# Patient Record
Sex: Female | Born: 1950 | Race: White | Hispanic: No | Marital: Married | Smoking: Never smoker
Health system: Southern US, Community
[De-identification: ages and names within clinical notes are randomized; demographics above are authoritative.]

## PROBLEM LIST (undated history)

## (undated) DIAGNOSIS — Z800 Family history of malignant neoplasm of digestive organs: Secondary | ICD-10-CM

## (undated) DIAGNOSIS — F419 Anxiety disorder, unspecified: Secondary | ICD-10-CM

## (undated) DIAGNOSIS — C801 Malignant (primary) neoplasm, unspecified: Secondary | ICD-10-CM

## (undated) DIAGNOSIS — D351 Benign neoplasm of parathyroid gland: Secondary | ICD-10-CM

## (undated) DIAGNOSIS — E119 Type 2 diabetes mellitus without complications: Secondary | ICD-10-CM

## (undated) DIAGNOSIS — K219 Gastro-esophageal reflux disease without esophagitis: Secondary | ICD-10-CM

## (undated) DIAGNOSIS — Z8489 Family history of other specified conditions: Secondary | ICD-10-CM

## (undated) DIAGNOSIS — Z8042 Family history of malignant neoplasm of prostate: Secondary | ICD-10-CM

## (undated) DIAGNOSIS — M810 Age-related osteoporosis without current pathological fracture: Secondary | ICD-10-CM

## (undated) HISTORY — DX: Age-related osteoporosis without current pathological fracture: M81.0

## (undated) HISTORY — PX: CHOLECYSTECTOMY: SHX55

## (undated) HISTORY — DX: Type 2 diabetes mellitus without complications: E11.9

## (undated) HISTORY — DX: Family history of malignant neoplasm of digestive organs: Z80.0

## (undated) HISTORY — DX: Benign neoplasm of parathyroid gland: D35.1

## (undated) HISTORY — DX: Family history of malignant neoplasm of prostate: Z80.42

## (undated) HISTORY — PX: DILATION AND CURETTAGE OF UTERUS: SHX78

## (undated) HISTORY — PX: ROTATOR CUFF REPAIR: SHX139

## (undated) HISTORY — PX: TONSILLECTOMY: SUR1361

## (undated) HISTORY — DX: Malignant (primary) neoplasm, unspecified: C80.1

## (undated) HISTORY — PX: ELBOW SURGERY: SHX618

## (undated) HISTORY — PX: COLONOSCOPY WITH ESOPHAGOGASTRODUODENOSCOPY (EGD): SHX5779

## (undated) HISTORY — DX: Gastro-esophageal reflux disease without esophagitis: K21.9

## (undated) HISTORY — DX: Anxiety disorder, unspecified: F41.9

---

## 2013-09-05 DIAGNOSIS — M1990 Unspecified osteoarthritis, unspecified site: Secondary | ICD-10-CM | POA: Insufficient documentation

## 2013-10-05 DIAGNOSIS — E119 Type 2 diabetes mellitus without complications: Secondary | ICD-10-CM | POA: Insufficient documentation

## 2014-01-25 DIAGNOSIS — L719 Rosacea, unspecified: Secondary | ICD-10-CM | POA: Insufficient documentation

## 2014-01-25 DIAGNOSIS — I8390 Asymptomatic varicose veins of unspecified lower extremity: Secondary | ICD-10-CM | POA: Insufficient documentation

## 2014-01-25 DIAGNOSIS — E785 Hyperlipidemia, unspecified: Secondary | ICD-10-CM | POA: Insufficient documentation

## 2014-01-30 DIAGNOSIS — M810 Age-related osteoporosis without current pathological fracture: Secondary | ICD-10-CM | POA: Insufficient documentation

## 2014-05-08 DIAGNOSIS — E559 Vitamin D deficiency, unspecified: Secondary | ICD-10-CM | POA: Insufficient documentation

## 2014-05-10 DIAGNOSIS — Z862 Personal history of diseases of the blood and blood-forming organs and certain disorders involving the immune mechanism: Secondary | ICD-10-CM | POA: Insufficient documentation

## 2016-10-21 HISTORY — PX: PARATHYROIDECTOMY: SHX19

## 2017-03-13 DIAGNOSIS — K219 Gastro-esophageal reflux disease without esophagitis: Secondary | ICD-10-CM | POA: Insufficient documentation

## 2017-03-13 DIAGNOSIS — F419 Anxiety disorder, unspecified: Secondary | ICD-10-CM | POA: Insufficient documentation

## 2017-03-13 DIAGNOSIS — Z8639 Personal history of other endocrine, nutritional and metabolic disease: Secondary | ICD-10-CM | POA: Insufficient documentation

## 2017-03-30 DIAGNOSIS — M5430 Sciatica, unspecified side: Secondary | ICD-10-CM | POA: Insufficient documentation

## 2017-04-06 DIAGNOSIS — C801 Malignant (primary) neoplasm, unspecified: Secondary | ICD-10-CM

## 2017-04-06 HISTORY — DX: Malignant (primary) neoplasm, unspecified: C80.1

## 2017-04-10 ENCOUNTER — Telehealth: Payer: Self-pay | Admitting: Gynecology

## 2017-04-10 NOTE — Telephone Encounter (Signed)
Received a call from Dr. Beryle Flock office to schedule an urgent appt for Cheryl Melton. Appt scheduled for the pt to see Dr. Fermin Schwab on 10/8 at 315pm. Referring office will notify the pt.

## 2017-04-13 ENCOUNTER — Encounter: Payer: Self-pay | Admitting: Gynecologic Oncology

## 2017-04-13 ENCOUNTER — Other Ambulatory Visit (HOSPITAL_BASED_OUTPATIENT_CLINIC_OR_DEPARTMENT_OTHER): Payer: Medicare Other

## 2017-04-13 ENCOUNTER — Ambulatory Visit: Payer: Medicare Other | Attending: Gynecology | Admitting: Gynecology

## 2017-04-13 ENCOUNTER — Encounter: Payer: Self-pay | Admitting: Gynecology

## 2017-04-13 VITALS — BP 130/74 | HR 99 | Temp 98.1°F | Resp 20 | Ht 67.0 in | Wt 247.3 lb

## 2017-04-13 DIAGNOSIS — Z7984 Long term (current) use of oral hypoglycemic drugs: Secondary | ICD-10-CM | POA: Insufficient documentation

## 2017-04-13 DIAGNOSIS — K669 Disorder of peritoneum, unspecified: Secondary | ICD-10-CM

## 2017-04-13 DIAGNOSIS — N839 Noninflammatory disorder of ovary, fallopian tube and broad ligament, unspecified: Secondary | ICD-10-CM

## 2017-04-13 DIAGNOSIS — Z800 Family history of malignant neoplasm of digestive organs: Secondary | ICD-10-CM

## 2017-04-13 DIAGNOSIS — N838 Other noninflammatory disorders of ovary, fallopian tube and broad ligament: Secondary | ICD-10-CM

## 2017-04-13 DIAGNOSIS — Z888 Allergy status to other drugs, medicaments and biological substances status: Secondary | ICD-10-CM | POA: Diagnosis not present

## 2017-04-13 DIAGNOSIS — Z7983 Long term (current) use of bisphosphonates: Secondary | ICD-10-CM | POA: Diagnosis not present

## 2017-04-13 DIAGNOSIS — Z886 Allergy status to analgesic agent status: Secondary | ICD-10-CM | POA: Diagnosis not present

## 2017-04-13 DIAGNOSIS — Z79899 Other long term (current) drug therapy: Secondary | ICD-10-CM | POA: Diagnosis not present

## 2017-04-13 DIAGNOSIS — C569 Malignant neoplasm of unspecified ovary: Secondary | ICD-10-CM | POA: Diagnosis present

## 2017-04-13 DIAGNOSIS — N979 Female infertility, unspecified: Secondary | ICD-10-CM | POA: Insufficient documentation

## 2017-04-13 LAB — BASIC METABOLIC PANEL
ANION GAP: 11 meq/L (ref 3–11)
BUN: 13.1 mg/dL (ref 7.0–26.0)
CO2: 26 mEq/L (ref 22–29)
Calcium: 9.8 mg/dL (ref 8.4–10.4)
Chloride: 103 mEq/L (ref 98–109)
Creatinine: 0.9 mg/dL (ref 0.6–1.1)
EGFR: 71 mL/min/{1.73_m2} — AB (ref >90)
Glucose: 135 mg/dl (ref 70–140)
POTASSIUM: 4.2 meq/L (ref 3.5–5.1)
SODIUM: 140 meq/L (ref 136–145)

## 2017-04-13 LAB — CBC WITH DIFFERENTIAL/PLATELET
BASO%: 0.8 % (ref 0.0–2.0)
Basophils Absolute: 0 10*3/uL (ref 0.0–0.1)
EOS%: 2.2 % (ref 0.0–7.0)
Eosinophils Absolute: 0.1 10*3/uL (ref 0.0–0.5)
HCT: 36.9 % (ref 34.8–46.6)
HGB: 11.9 g/dL (ref 11.6–15.9)
LYMPH#: 1.4 10*3/uL (ref 0.9–3.3)
LYMPH%: 25.5 % (ref 14.0–49.7)
MCH: 29 pg (ref 25.1–34.0)
MCHC: 32.2 g/dL (ref 31.5–36.0)
MCV: 89.9 fL (ref 79.5–101.0)
MONO#: 0.5 10*3/uL (ref 0.1–0.9)
MONO%: 8.5 % (ref 0.0–14.0)
NEUT%: 63 % (ref 38.4–76.8)
NEUTROS ABS: 3.6 10*3/uL (ref 1.5–6.5)
Platelets: 340 10*3/uL (ref 145–400)
RBC: 4.1 10*6/uL (ref 3.70–5.45)
RDW: 14.8 % — ABNORMAL HIGH (ref 11.2–14.5)
WBC: 5.6 10*3/uL (ref 3.9–10.3)

## 2017-04-13 NOTE — Progress Notes (Signed)
Consult Note: Gyn-Onc   Cheryl Melton 66 y.o. female  Chief Complaint  Patient presents with  . Malignant neoplasm of ovary, unspecified laterality ([place])    Assessment : Apparent stage III C ovarian carcinoma (pending final pathology report)  Plan: Presuming final pathology shows this to be an ovarian cancer, would recommend neoadjuvant chemotherapy given the extent of carcinomatosis documented by Dr. Jearld Shines operative note and photographs.  I recommend the patient have a CT scan of the chest abdomen and pelvis to use has baseline comparison. CA-125 be obtained today.  I described chemotherapy to the patient her 2 sisters and husband including carboplatin and Taxol and the general expected side effects. We'll arrange the patient see medical oncologist here in the [facility] to initiate therapy. We will reevaluate the patient after 3 cycles.     HPI: 66 year old white e there married female gravida  seen in consultation request of Dr.[name] regarding management of a newly diagnosed advanced ovarian cancer. 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 There is no free fluid or other adnexal masses noted. She underwent laparoscopy on 04/10/2017 with the surprise finding of peritoneal studding, omental cake, and a small amount of ascites. Biopsy of peritoneal implants was obtained but the result is pending at the time of this dictation. No tumor markers have been obtained.  Retrospectively the patient denies any particular symptoms that might been associated with ovarian cancer. She has no family history of ovarian or breast cancer. Her mother had colon cancer. She has no other gynecologic history although she has primary infertility.  Today she is recovering from laparoscopy 3 days  ago. Her umbilical incision is somewhat tender. Otherwise she has no other symptoms.  Review of Systems:10 point review of systems is negative except as noted in interval history.   Vitals: Blood pressure 130/74, pulse 99, temperature 98.1 F (36.7 C), temperature source Oral, resp. rate 20, height 5\' 7"  (1.702 m), weight 247 lb 4.8 oz (112.2 kg), SpO2 97 %.  Physical Exam: General : The patient is a healthy woman in no acute distress.  HEENT: normocephalic, extraoccular movements normal; neck is supple without thyromegally  Lynphnodes: Supraclavicular and inguinal nodes not enlarged  Abdomen: obese, Soft, non-tender, no ascites, no organomegally, no masses, no hernias  umbilical incision is healing. Pelvic:  EGBUS: Normal female  Vagina: Normal, no lesions  Urethra and Bladder: Normal, non-tender  Cervix:  small normal Uterus:  difficult to outline secondary to the patient's habitus. Bi-manual examination: Non-tender; no adenxal masses or nodularity  Rectal: normal sphincter tone, no masses, no blood  Lower extremities: No edema or varicosities. Normal range of motion      Allergies  Allergen Reactions  . Aspirin Other (See Comments)    NSAIDS( advised not to take per hematology)  . Nsaids Other (See Comments)    Gastritis  . Tramadol Other (See Comments)    Headache, vomiting and disorientation     No past medical history on file.  No past surgical history on file.  Current Outpatient Prescriptions  Medication Sig Dispense Refill  . alendronate (FOSAMAX) 70 MG tablet Take 70 mg by mouth.    Marland Kitchen atorvastatin (LIPITOR) 80 MG tablet Take 80 mg by mouth.    . citalopram (CELEXA) 10 MG tablet TAKE  1 TABLET EVERY DAY    . gabapentin (NEURONTIN) 300 MG capsule Take 300 mg by mouth.    Marland Kitchen glucose blood test strip 1 each. USE ONE TOUCH VERIO TO CHECK BLOOD SUGARS ONCE DAILY.    Marland Kitchen Lancets 30G MISC Use 1 lancet daily to test blood sugars (E11.9)    . losartan (COZAAR) 25 MG tablet  Take 25 mg by mouth.    . meclizine (ANTIVERT) 25 MG tablet Take 25 mg by mouth.    . metFORMIN (GLUCOPHAGE) 1000 MG tablet Take 1,000 mg by mouth.    Marland Kitchen omeprazole (PRILOSEC) 40 MG capsule Take 40 mg by mouth.    . pioglitazone (ACTOS) 15 MG tablet Take 15 mg by mouth.    Marland Kitchen tiZANidine (ZANAFLEX) 4 MG tablet Take 4 mg by mouth.    Marland Kitchen acetaminophen (TYLENOL) 650 MG CR tablet Take 1,300 mg by mouth.    . Glucosamine Sulfate 500 MG TABS Take by mouth.    . linagliptin (TRADJENTA) 5 MG TABS tablet Take by mouth.    . loratadine (CLARITIN) 10 MG tablet Take 10 mg by mouth.    . vitamin B-12 (CYANOCOBALAMIN) 1000 MCG tablet Take by mouth.     No current facility-administered medications for this visit.     Social History   Social History  . Marital status: Married    Spouse name: N/A  . Number of children: N/A  . Years of education: N/A   Occupational History  . Not on file.   Social History Main Topics  . Smoking status: Not on file  . Smokeless tobacco: Not on file  . Alcohol use Not on file  . Drug use: Unknown  . Sexual activity: Not on file   Other Topics Concern  . Not on file   Social History Narrative  . No narrative on file    No family history on file.    [name], MD 04/13/2017, 3:43 PM

## 2017-04-13 NOTE — Patient Instructions (Signed)
Plan on having a CA 125 drawn (tumor marker-elevated 80% of the time with ovarian cancer).  We will also obtain a CT scan of the chest, abdomen, and pelvis.  We will contact you with the results.  Plan on meeting with Dr. Heath Lark, Medical Oncologist to discuss and arrange for chemotherapy.  We recommend three cycles of carboplatin and taxol then consideration for surgery.  Please call for any questions or concerns.

## 2017-04-14 ENCOUNTER — Telehealth: Payer: Self-pay | Admitting: *Deleted

## 2017-04-14 ENCOUNTER — Telehealth: Payer: Self-pay | Admitting: Hematology and Oncology

## 2017-04-14 ENCOUNTER — Encounter: Payer: Self-pay | Admitting: Hematology and Oncology

## 2017-04-14 ENCOUNTER — Ambulatory Visit (HOSPITAL_BASED_OUTPATIENT_CLINIC_OR_DEPARTMENT_OTHER): Payer: Medicare Other | Admitting: Hematology and Oncology

## 2017-04-14 VITALS — BP 131/53 | HR 89 | Temp 98.5°F | Resp 17 | Ht 67.0 in | Wt 248.2 lb

## 2017-04-14 DIAGNOSIS — D351 Benign neoplasm of parathyroid gland: Secondary | ICD-10-CM | POA: Diagnosis not present

## 2017-04-14 DIAGNOSIS — S22080A Wedge compression fracture of T11-T12 vertebra, initial encounter for closed fracture: Secondary | ICD-10-CM

## 2017-04-14 DIAGNOSIS — E119 Type 2 diabetes mellitus without complications: Secondary | ICD-10-CM | POA: Diagnosis not present

## 2017-04-14 DIAGNOSIS — M8000XA Age-related osteoporosis with current pathological fracture, unspecified site, initial encounter for fracture: Secondary | ICD-10-CM

## 2017-04-14 DIAGNOSIS — S22009A Unspecified fracture of unspecified thoracic vertebra, initial encounter for closed fracture: Secondary | ICD-10-CM | POA: Insufficient documentation

## 2017-04-14 DIAGNOSIS — C569 Malignant neoplasm of unspecified ovary: Secondary | ICD-10-CM

## 2017-04-14 DIAGNOSIS — C561 Malignant neoplasm of right ovary: Secondary | ICD-10-CM | POA: Insufficient documentation

## 2017-04-14 LAB — CA 125: CANCER ANTIGEN (CA) 125: 324.1 U/mL — AB (ref 0.0–38.1)

## 2017-04-14 NOTE — Assessment & Plan Note (Signed)
This is fully resected and did not review any malignancy Her hypercalcemia has resolved No further workup is necessary

## 2017-04-14 NOTE — Assessment & Plan Note (Signed)
Patient had history of osteoporosis and vitamin D deficiency She has resumed vitamin D supplements since her parathyroid surgery I recommend she continues to same for now

## 2017-04-14 NOTE — Telephone Encounter (Signed)
Gave avs and calendar for October and November  °

## 2017-04-14 NOTE — Telephone Encounter (Signed)
Contacted the patient and scheduled new patient appt for Dr.Gorsuch at 2:30pm today.

## 2017-04-14 NOTE — Assessment & Plan Note (Signed)
She has well-controlled type 2 diabetes, medically managed I warned her about the risk of severe hypoglycemia during treatment

## 2017-04-14 NOTE — Assessment & Plan Note (Signed)
The patient has significant chronic back pain secondary to compression fracture I think she will benefit from kyphoplasty in the future For now, we will continue chronic pain management with Tylenol and gabapentin

## 2017-04-14 NOTE — Progress Notes (Signed)
START ON PATHWAY REGIMEN - Ovarian     A cycle is every 21 days:     Paclitaxel      Carboplatin   **Always confirm dose/schedule in your pharmacy ordering system**    Patient Characteristics: Newly Diagnosed, Neoadjuvant Therapy AJCC T Category: T3 AJCC N Category: N0 AJCC M Category: M0 Therapeutic Status: Newly Diagnosed, Neoadjuvant Therapy AJCC 8 Stage Grouping: Unknown Intent of Therapy: Curative Intent, Discussed with Patient

## 2017-04-14 NOTE — Assessment & Plan Note (Addendum)
I reviewed the plan of care with the patient and her sisters CT scan is pending Tumor marker is elevated. So far, I do not have any pathology report but according to information received from outside facility, it appears that she may have either primary peritoneal cancer versus ovarian cancer The patient will return to visit her surgeon at the end of the week and hopefully pathology report will be available by then If pathology report confirmed ovarian cancer, I think she would benefit from chemotherapy and surgery She will also benefit from genetic counseling in the future We discussed the role for neoadjuvant chemotherapy We discussed risk and benefit of port placement and the patient is undecided I recommend chemo education class, return visit next week to review imaging study and repeat baseline blood work I have tentatively schedule her to start first dose of combination chemotherapy with carboplatin and Taxol on April 24, 2018 Given her age, I would recommend prophylactic G-CSF support

## 2017-04-14 NOTE — Progress Notes (Signed)
[place] CONSULT NOTE  No care team member to display  CHIEF COMPLAINTS/PURPOSE OF CONSULTATION:  Newly diagnosed ovarian cancers versus primary peritoneal cancer, for further management  HISTORY OF PRESENTING ILLNESS:  Cheryl Melton 66 y.o. female is here because of recent diagnosis of cancer. She is accompanied by her 2 sisters, Scarlet and Velva Harman today.  The patient had been retired since early this year after she had a traumatic fall with vertebral fracture She was undergoing extensive evaluation by orthopedic surgery including MRI imaging I do not have copies of the report but according to outside report, she was noted to have abnormal pelvic mass At the time of MRI in May, she denies abdominal bloating, pain or postmenopausal bleeding She was found to have hypercalcemia from routine blood work Further imaging investigation revealed possible parathyroid adenoma/carcinoma requiring surgery and hence there is significant delay of getting evaluation for pelvic mass She subsequently underwent surgery for hyperparathyroidism which resolve her problems with hypercalcemia. She was subsequently referred to see GYN oncologist for evaluation and surgery.   I have reviewed her electronic records extensively from another facility Summary of oncologic history as follows:   Ovarian cancer ([place])   11/12/2016 Imaging    Outside MRI imaging evaluating her back pain (compression fracture of T12) subsequent ultrasound showed a right adnexal mass measuring 7.4 x 6.7 cm containing both cystic and solid area 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       03/20/2017 Imaging    GYN ultrasound was performed this revealed a small fundus measuring 5 cm there are 2 small fibroids within the fundus with the largest measuring 1.3 cm Within the endometrium there is a small amount a fluid the endometrial wall was slightly thickened at 4.8 mm the ultrasound was consistent with a small endometrial polyp  The  right adnexa measured 7.4 x 6.7 cm contained a both a cystic and solid mass with a small amount of shadowing This is consistent with a dermoid tumor the left ovary was 2.6 x 1.5 x 1.0 cm There is no other adnexal masses no free fluid within the abdomen no ascites          04/10/2017 Surgery    Operation: Operative Laparscopy with  Peritoneal biopsy 2 hysteroscopy D&C Surgeon: Lynder Parents MD  Specimens: Peritoneal biopsy and cellular washings  Complications: None FIndings Small amount of ascites was found cellular washings were sent There was peritoneal studding And omental caking Even in steep Trendelenburg the ovaries and uterus were unable to be visualized due to the small intestines in the lower pelvis          04/13/2017 Tumor Marker    Patient's tumor was tested for the following markers: CA-125 Results of the tumor marker test revealed 324.1      Since surgery, she complained of mild incisional pain. She has chronic baseline back pain since her fall of which she is taking extra strength Tylenol and gabapentin.  Her pain is reasonably controlled. She complained of abdominal bloating since surgery She denies changes in bowel habits No recent nausea or vomiting She had baseline type 2 diabetes well controlled without complications such as peripheral neuropathy.  MEDICAL HISTORY:  Past Medical History:  Diagnosis Date  . Anxiety   . Cancer ([place])   . Diabetes mellitus without complication ([place])   . GERD (gastroesophageal reflux disease)   . Osteoporosis   . Parathyroid adenoma     SURGICAL HISTORY: Past Surgical History:  Procedure  Laterality Date  . CHOLECYSTECTOMY    . COLONOSCOPY WITH ESOPHAGOGASTRODUODENOSCOPY (EGD)    . DILATION AND CURETTAGE OF UTERUS    . ELBOW SURGERY    . PARATHYROIDECTOMY  10/21/2016   One side done pt not sure which side.   Marland Kitchen ROTATOR CUFF REPAIR Right   . TONSILLECTOMY      SOCIAL HISTORY: Social History   Social  History  . Marital status: Married    Spouse name: Mikeal Hawthorne  . Number of children: 0  . Years of education: N/A   Occupational History  . retired    Social History Main Topics  . Smoking status: Never Smoker  . Smokeless tobacco: Never Used  . Alcohol use No  . Drug use: No  . Sexual activity: Not on file   Other Topics Concern  . Not on file   Social History Narrative  . No narrative on file    FAMILY HISTORY: Family History  Problem Relation Age of Onset  . Colon cancer Mother 30  . Prostate cancer Father 65    ALLERGIES:  is allergic to aspirin; nsaids; and tramadol.  MEDICATIONS:  Current Outpatient Prescriptions  Medication Sig Dispense Refill  . acetaminophen (TYLENOL) 650 MG CR tablet Take 1,300 mg by mouth.    Marland Kitchen alendronate (FOSAMAX) 70 MG tablet Take 70 mg by mouth.    Marland Kitchen atorvastatin (LIPITOR) 80 MG tablet Take 80 mg by mouth.    . citalopram (CELEXA) 10 MG tablet TAKE 1 TABLET EVERY DAY    . gabapentin (NEURONTIN) 300 MG capsule Take 300 mg by mouth.    . Glucosamine Sulfate 500 MG TABS Take by mouth.    Marland Kitchen glucose blood test strip 1 each. USE ONE TOUCH VERIO TO CHECK BLOOD SUGARS ONCE DAILY.    Marland Kitchen Lancets 30G MISC Use 1 lancet daily to test blood sugars (E11.9)    . linagliptin (TRADJENTA) 5 MG TABS tablet Take by mouth.    . loratadine (CLARITIN) 10 MG tablet Take 10 mg by mouth.    . losartan (COZAAR) 25 MG tablet Take 25 mg by mouth.    . meclizine (ANTIVERT) 25 MG tablet Take 25 mg by mouth.    . metFORMIN (GLUCOPHAGE) 1000 MG tablet Take 1,000 mg by mouth.    Marland Kitchen omeprazole (PRILOSEC) 40 MG capsule Take 40 mg by mouth.    . pioglitazone (ACTOS) 15 MG tablet Take 15 mg by mouth.    . vitamin B-12 (CYANOCOBALAMIN) 1000 MCG tablet Take by mouth.     No current facility-administered medications for this visit.     REVIEW OF SYSTEMS:   Constitutional: Denies fevers, chills or abnormal night sweats Eyes: Denies blurriness of vision, double vision or watery  eyes Ears, nose, mouth, throat, and face: Denies mucositis or sore throat Respiratory: Denies cough, dyspnea or wheezes Cardiovascular: Denies palpitation, chest discomfort or lower extremity swelling Gastrointestinal:  Denies nausea, heartburn or change in bowel habits Skin: Denies abnormal skin rashes Lymphatics: Denies new lymphadenopathy or easy bruising Neurological:Denies numbness, tingling or new weaknesses Behavioral/Psych: Mood is stable, no new changes  All other systems were reviewed with the patient and are negative.  PHYSICAL EXAMINATION: ECOG PERFORMANCE STATUS: 1 - Symptomatic but completely ambulatory  Vitals:   04/14/17 1358  BP: (!) 131/53  Pulse: 89  Resp: 17  Temp: 98.5 F (36.9 C)  SpO2: 97%   Filed Weights   04/14/17 1358  Weight: 248 lb 3.2 oz (112.6 kg)  GENERAL:alert, no distress and comfortable.  She is morbidly obese and appears debilitated.  She have difficulties getting up and down the examination table SKIN: skin color, texture, turgor are normal, no rashes or significant lesions.  Noted minus skin bruising on her abdominal wall near the incision site EYES: normal, conjunctiva are pink and non-injected, sclera clear OROPHARYNX:no exudate, no erythema and lips, buccal mucosa, and tongue normal  NECK: supple, thyroid normal size, non-tender, without nodularity.  Well-healed surgical scar LYMPH:  no palpable lymphadenopathy in the cervical, axillary or inguinal LUNGS: clear to auscultation and percussion with normal breathing effort HEART: regular rate & rhythm and no murmurs and no lower extremity edema ABDOMEN:abdomen soft, non-tender and normal bowel sounds.  Her abdomen is significantly distended/bloated from surgery.  Well-healed surgical scar Musculoskeletal:no cyanosis of digits and no clubbing  PSYCH: alert & oriented x 3 with fluent speech NEURO: no focal motor/sensory deficits  LABORATORY DATA:  I have reviewed the data as listed Lab  Results  Component Value Date   WBC 5.6 04/13/2017   HGB 11.9 04/13/2017   HCT 36.9 04/13/2017   MCV 89.9 04/13/2017   PLT 340 04/13/2017    Recent Labs  04/13/17 1626  NA 140  K 4.2  CO2 26  GLUCOSE 135  BUN 13.1  CREATININE 0.9  CALCIUM 9.8    ASSESSMENT & PLAN:  Ovarian cancer ([place]) I reviewed the plan of care with the patient and her sisters CT scan is pending Tumor marker is elevated. So far, I do not have any pathology report but according to information received from outside facility, it appears that she may have either primary peritoneal cancer versus ovarian cancer The patient will return to visit her surgeon at the end of the week and hopefully pathology report will be available by then If pathology report confirmed ovarian cancer, I think she would benefit from chemotherapy and surgery She will also benefit from genetic counseling in the future We discussed the role for neoadjuvant chemotherapy We discussed risk and benefit of port placement and the patient is undecided I recommend chemo education class, return visit next week to review imaging study and repeat baseline blood work I have tentatively schedule her to start first dose of combination chemotherapy with carboplatin and Taxol on April 24, 2018 Given her age, I would recommend prophylactic G-CSF support   Type 2 diabetes mellitus without complication ([place]) She has well-controlled type 2 diabetes, medically managed I warned her about the risk of severe hypoglycemia during treatment  Closed traumatic compression fracture of thoracic vertebra ([place]) The patient has significant chronic back pain secondary to compression fracture I think she will benefit from kyphoplasty in the future For now, we will continue chronic pain management with Tylenol and gabapentin  Osteoporosis Patient had history of osteoporosis and vitamin D deficiency She has resumed vitamin D supplements since her parathyroid  surgery I recommend she continues to same for now  Parathyroid adenoma This is fully resected and did not review any malignancy Her hypercalcemia has resolved No further workup is necessary  Orders Placed This Encounter  Procedures  . CBC with Differential    Standing Status:   Standing    Number of Occurrences:   20    Standing Expiration Date:   04/15/2018  . Comprehensive metabolic panel    Standing Status:   Standing    Number of Occurrences:   20    Standing Expiration Date:   04/15/2018  . CA 125  Standing Status:   Standing    Number of Occurrences:   9    Standing Expiration Date:   04/14/2018    All questions were answered. The patient knows to call the clinic with any problems, questions or concerns. I spent 60 minutes counseling the patient face to face. The total time spent in the appointment was 90 minutes and more than 50% was on counseling.     Heath Lark, MD 04/14/2017 4:28 PM

## 2017-04-15 ENCOUNTER — Telehealth: Payer: Self-pay | Admitting: Gynecologic Oncology

## 2017-04-15 NOTE — Telephone Encounter (Signed)
Called patient and advised of lab results including CA 125.  No concerns voiced.  Advised to call for any needs.

## 2017-04-17 ENCOUNTER — Ambulatory Visit (HOSPITAL_COMMUNITY)
Admission: RE | Admit: 2017-04-17 | Discharge: 2017-04-17 | Disposition: A | Discharge status: Home / Self Care | Payer: Medicare Other | Source: Ambulatory Visit | Attending: Gynecologic Oncology | Admitting: Gynecologic Oncology

## 2017-04-17 ENCOUNTER — Encounter (HOSPITAL_COMMUNITY): Payer: Self-pay | Admitting: Radiology

## 2017-04-17 DIAGNOSIS — N839 Noninflammatory disorder of ovary, fallopian tube and broad ligament, unspecified: Secondary | ICD-10-CM | POA: Insufficient documentation

## 2017-04-17 DIAGNOSIS — K669 Disorder of peritoneum, unspecified: Secondary | ICD-10-CM | POA: Insufficient documentation

## 2017-04-17 DIAGNOSIS — R9389 Abnormal findings on diagnostic imaging of other specified body structures: Secondary | ICD-10-CM | POA: Insufficient documentation

## 2017-04-17 DIAGNOSIS — R911 Solitary pulmonary nodule: Secondary | ICD-10-CM | POA: Insufficient documentation

## 2017-04-17 DIAGNOSIS — N838 Other noninflammatory disorders of ovary, fallopian tube and broad ligament: Secondary | ICD-10-CM

## 2017-04-17 MED ORDER — IOPAMIDOL (ISOVUE-300) INJECTION 61%
100.0000 mL | Freq: Once | INTRAVENOUS | Status: AC | PRN
  Administered 2017-04-17: 100 mL via INTRAVENOUS

## 2017-04-17 MED ORDER — IOPAMIDOL (ISOVUE-300) INJECTION 61%
INTRAVENOUS | Status: AC
  Filled 2017-04-17: qty 100

## 2017-04-20 ENCOUNTER — Encounter: Payer: Self-pay | Admitting: Hematology and Oncology

## 2017-04-20 ENCOUNTER — Other Ambulatory Visit (HOSPITAL_BASED_OUTPATIENT_CLINIC_OR_DEPARTMENT_OTHER): Payer: Medicare Other

## 2017-04-20 ENCOUNTER — Other Ambulatory Visit: Payer: Self-pay | Admitting: Gynecologic Oncology

## 2017-04-20 ENCOUNTER — Other Ambulatory Visit: Payer: Medicare Other

## 2017-04-20 ENCOUNTER — Ambulatory Visit (HOSPITAL_BASED_OUTPATIENT_CLINIC_OR_DEPARTMENT_OTHER): Payer: Medicare Other | Admitting: Hematology and Oncology

## 2017-04-20 VITALS — BP 135/68 | HR 84 | Temp 97.9°F | Resp 17 | Ht 67.0 in | Wt 245.3 lb

## 2017-04-20 DIAGNOSIS — R911 Solitary pulmonary nodule: Secondary | ICD-10-CM

## 2017-04-20 DIAGNOSIS — C786 Secondary malignant neoplasm of retroperitoneum and peritoneum: Secondary | ICD-10-CM

## 2017-04-20 DIAGNOSIS — T148XXA Other injury of unspecified body region, initial encounter: Secondary | ICD-10-CM

## 2017-04-20 DIAGNOSIS — C569 Malignant neoplasm of unspecified ovary: Secondary | ICD-10-CM

## 2017-04-20 DIAGNOSIS — C561 Malignant neoplasm of right ovary: Secondary | ICD-10-CM

## 2017-04-20 DIAGNOSIS — E119 Type 2 diabetes mellitus without complications: Secondary | ICD-10-CM | POA: Diagnosis not present

## 2017-04-20 DIAGNOSIS — C801 Malignant (primary) neoplasm, unspecified: Principal | ICD-10-CM

## 2017-04-20 DIAGNOSIS — L24A9 Irritant contact dermatitis due friction or contact with other specified body fluids: Secondary | ICD-10-CM

## 2017-04-20 LAB — COMPREHENSIVE METABOLIC PANEL
ALBUMIN: 3.8 g/dL (ref 3.5–5.0)
ALK PHOS: 71 U/L (ref 40–150)
ALT: 19 U/L (ref 0–55)
AST: 21 U/L (ref 5–34)
Anion Gap: 11 mEq/L (ref 3–11)
BUN: 13.5 mg/dL (ref 7.0–26.0)
CO2: 25 meq/L (ref 22–29)
Calcium: 9.6 mg/dL (ref 8.4–10.4)
Chloride: 105 mEq/L (ref 98–109)
Creatinine: 0.8 mg/dL (ref 0.6–1.1)
EGFR: >60 mL/min/{1.73_m2} (ref >60)
GLUCOSE: 133 mg/dL (ref 70–140)
POTASSIUM: 4.5 meq/L (ref 3.5–5.1)
SODIUM: 140 meq/L (ref 136–145)
Total Bilirubin: 0.34 mg/dL (ref 0.20–1.20)
Total Protein: 7.6 g/dL (ref 6.4–8.3)

## 2017-04-20 LAB — CBC WITH DIFFERENTIAL/PLATELET
BASO%: 0.7 % (ref 0.0–2.0)
BASOS ABS: 0 10*3/uL (ref 0.0–0.1)
EOS ABS: 0.2 10*3/uL (ref 0.0–0.5)
EOS%: 2.7 % (ref 0.0–7.0)
HCT: 38.7 % (ref 34.8–46.6)
HEMOGLOBIN: 11.8 g/dL (ref 11.6–15.9)
LYMPH#: 1.1 10*3/uL (ref 0.9–3.3)
LYMPH%: 18.6 % (ref 14.0–49.7)
MCH: 28.8 pg (ref 25.1–34.0)
MCHC: 30.5 g/dL — ABNORMAL LOW (ref 31.5–36.0)
MCV: 94.4 fL (ref 79.5–101.0)
MONO#: 0.4 10*3/uL (ref 0.1–0.9)
MONO%: 6.4 % (ref 0.0–14.0)
NEUT%: 71.6 % (ref 38.4–76.8)
NEUTROS ABS: 4.2 10*3/uL (ref 1.5–6.5)
Platelets: 366 10*3/uL (ref 145–400)
RBC: 4.1 10*6/uL (ref 3.70–5.45)
RDW: 14.5 % (ref 11.2–14.5)
WBC: 5.9 10*3/uL (ref 3.9–10.3)

## 2017-04-20 MED ORDER — ONDANSETRON HCL 8 MG PO TABS: ORAL_TABLET | ORAL | 1 refills | Status: DC

## 2017-04-20 MED ORDER — PROCHLORPERAZINE MALEATE 10 MG PO TABS: 10.0000 mg | ORAL_TABLET | Freq: Four times a day (QID) | ORAL | 1 refills | Status: DC | PRN

## 2017-04-20 MED ORDER — DEXAMETHASONE 4 MG PO TABS: ORAL_TABLET | ORAL | 1 refills | Status: DC

## 2017-04-20 NOTE — Assessment & Plan Note (Signed)
We discussed the importance of biopsy. The patient is having CT-guided biopsy in 2 days. If confirmed malignancy, based on imaging study and clinical findings, the patient likely have right ovarian cancer with peritoneal carcinomatosis. We discussed the role of neoadjuvant chemotherapy. We reviewed the NCCN guidelines We discussed the role of chemotherapy. The intent is of curative intent.  We discussed some of the risks, benefits, side-effects of carboplatin & Taxol. Treatment is intravenous, every 3 weeks x 6 cycles  Some of the short term side-effects included, though not limited to, including weight loss, life threatening infections, risk of allergic reactions, need for transfusions of blood products, nausea, vomiting, change in bowel habits, loss of hair, admission to hospital for various reasons, and risks of death.   Long term side-effects are also discussed including risks of infertility, permanent damage to nerve function, hearing loss, chronic fatigue, kidney damage with possibility needing hemodialysis, and rare secondary malignancy including bone marrow disorders.  The patient is aware that the response rates discussed earlier is not guaranteed.  After a long discussion, patient made an informed decision to proceed with the prescribed plan of care.   Patient education material was dispensed. We discussed premedication with dexamethasone before chemotherapy. The patient will get repeat imaging study after 3 cycles If she have excellent response to treatment, she will undergo interim surgical debulking procedure We will follow her clinical progress with tumor markers, history and physical examination She will get growth factor support due to advanced age, comorbidities and advanced stage disease I will see her back prior to cycle 2 of treatment

## 2017-04-20 NOTE — Assessment & Plan Note (Signed)
She has mild wound drainage Her wound is unlikely to heal until chemotherapy For now, we will continue conservative approach with dressing changes only The wound is not infected and she does not need antibiotic therapy

## 2017-04-20 NOTE — Assessment & Plan Note (Signed)
Patient is at risk for hyperglycemia and worsening peripheral neuropathy during treatment I recommend close monitoring of her blood sugar after the first few days of treatment Due to risk of severe hypoglycemia, she will only take 12 mg of dexamethasone the day before and the morning before chemotherapy

## 2017-04-20 NOTE — Assessment & Plan Note (Signed)
The patient is not symptomatic I recommend surveillance imaging only It is indeterminate cause but unlikely to be metastatic

## 2017-04-20 NOTE — Progress Notes (Signed)
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       03/20/2017 Imaging    GYN ultrasound was performed this revealed a small fundus measuring 5 cm there are 2 small fibroids within the fundus with the largest measuring 1.3 cm Within the endometrium there is a small amount a fluid the endometrial wall was slightly thickened at 4.8 mm the ultrasound was consistent with a small endometrial polyp  The right adnexa measured 7.4 x 6.7 cm contained a both a cystic and solid mass with a small amount of shadowing This is consistent with a dermoid tumor the left ovary was 2.6 x 1.5 x 1.0 cm There is no other adnexal masses no free fluid within the abdomen no ascites          04/10/2017 Surgery    Operation: Operative Laparscopy with  Peritoneal biopsy 2 hysteroscopy D&C Surgeon: Lynder Parents MD  Specimens: Peritoneal biopsy and cellular washings  Complications: None FIndings Small amount of ascites was found cellular washings were sent There was peritoneal studding And omental caking Even in steep Trendelenburg the ovaries and uterus were unable to be visualized due to the small intestines in the lower pelvis          04/10/2017 Pathology Results    Biopsy from [facility] was nondiagnostic.  Peritoneum biopsy did not reveal malignancy.  Endometrial biopsy also did not reveal malignancy.      04/13/2017 Tumor Marker    Patient's tumor was tested for the following markers: CA-125 Results of the tumor marker test revealed 324.1       INTERVAL HISTORY: Please see below for problem  oriented charting. She returns with her sisters to follow-up on test results She started to have mild wound drainage recently Denies fever or chills No redness around her wound Unfortunately, biopsy from [facility] was nondiagnostic She is scheduled for CT-guided biopsy in 2 days In the meantime, she feels well  REVIEW OF SYSTEMS:   Constitutional: Denies fevers, chills or abnormal weight loss Eyes: Denies blurriness of vision Ears, nose, mouth, throat, and face: Denies mucositis or sore throat Respiratory: Denies cough, dyspnea or wheezes Cardiovascular: Denies palpitation, chest discomfort or lower extremity swelling Gastrointestinal:  Denies nausea, heartburn or change in bowel habits Skin: Denies abnormal skin rashes Lymphatics: Denies new lymphadenopathy or easy bruising Neurological:Denies numbness, tingling or new weaknesses Behavioral/Psych: Mood is stable, no new changes  All other systems were reviewed with the patient and are negative.  I have reviewed the past medical history, past surgical history, social history and family history with the patient and they are unchanged from previous note.  ALLERGIES:  is allergic to aspirin; nsaids; and tramadol.  MEDICATIONS:  Current Outpatient Prescriptions  Medication Sig Dispense Refill  . acetaminophen (TYLENOL) 650 MG CR tablet Take 1,300 mg by mouth every 8 (eight) hours.     Marland Kitchen alendronate (FOSAMAX) 70 MG tablet Take 70 mg by mouth every Sunday.     Marland Kitchen atorvastatin (LIPITOR) 80 MG tablet Take 40 mg by mouth at bedtime.     Marland Kitchen  Cholecalciferol (VITAMIN D3) 2000 units TABS Take 2,000 Units by mouth daily.    . citalopram (CELEXA) 10 MG tablet Take 10 mg by mouth at bedtime    . dexamethasone (DECADRON) 4 MG tablet Take 3 tabs the night before chemo and 3 tabs in the morning of chemo with food, every 21 days (Patient taking differently: Take 12 mg by mouth See admin instructions. Take 12 mg by mouth the night before chemo and 12 mg  in the morning of chemo with food, every 21 days) 36 tablet 1  . gabapentin (NEURONTIN) 300 MG capsule Take 300 mg by mouth every 8 (eight) hours.     . Glucosamine Sulfate 500 MG TABS Take 500 mg by mouth 2 (two) times daily.     Marland Kitchen linagliptin (TRADJENTA) 5 MG TABS tablet Take 5 mg by mouth daily.     Marland Kitchen loratadine (CLARITIN) 10 MG tablet Take 10 mg by mouth at bedtime.     Marland Kitchen losartan (COZAAR) 25 MG tablet Take 25 mg by mouth daily.     . meclizine (ANTIVERT) 25 MG tablet Take 25 mg by mouth 2 (two) times daily as needed for dizziness.     . metFORMIN (GLUCOPHAGE) 1000 MG tablet Take 1,000 mg by mouth 2 (two) times daily with a meal.     . omeprazole (PRILOSEC) 40 MG capsule Take 40 mg by mouth daily.     . ondansetron (ZOFRAN) 8 MG tablet Start on day 3 after chemo. (Patient taking differently: Take 8 mg by mouth See admin instructions. Take 8 mg by mouth as needed for nausea and vomiting, start on day 3 after chemo.) 30 tablet 1  . pioglitazone (ACTOS) 15 MG tablet Take 15 mg by mouth at bedtime.     . prochlorperazine (COMPAZINE) 10 MG tablet Take 1 tablet (10 mg total) by mouth every 6 (six) hours as needed (Nausea or vomiting). 30 tablet 1  . vitamin B-12 (CYANOCOBALAMIN) 1000 MCG tablet Take 1,000 mcg by mouth daily.      No current facility-administered medications for this visit.     PHYSICAL EXAMINATION: ECOG PERFORMANCE STATUS: 1 - Symptomatic but completely ambulatory  Vitals:   04/20/17 1205  BP: 135/68  Pulse: 84  Resp: 17  Temp: 97.9 F (36.6 C)  SpO2: 98%   Filed Weights   04/20/17 1205  Weight: 245 lb 4.8 oz (111.3 kg)    GENERAL:alert, no distress and comfortable.  She is morbidly obese SKIN: skin color, texture, turgor are normal, no rashes or significant lesions EYES: normal, Conjunctiva are pink and non-injected, sclera clear OROPHARYNX:no exudate, no erythema and lips, buccal mucosa, and tongue normal  NECK: supple, thyroid normal size, non-tender, without  nodularity LYMPH:  no palpable lymphadenopathy in the cervical, axillary or inguinal LUNGS: clear to auscultation and percussion with normal breathing effort HEART: regular rate & rhythm and no murmurs and no lower extremity edema ABDOMEN:abdomen soft, non-tender and normal bowel sounds.  She has a patch over the incision site but the surrounding skin area does not look infected Musculoskeletal:no cyanosis of digits and no clubbing  NEURO: alert & oriented x 3 with fluent speech, no focal motor/sensory deficits  LABORATORY DATA:  I have reviewed the data as listed    Component Value Date/Time   NA 140 04/20/2017 0921   K 4.5 04/20/2017 0921   CO2 25 04/20/2017 0921   GLUCOSE 133 04/20/2017 0921   BUN 13.5 04/20/2017 0921   CREATININE 0.8 04/20/2017 0921  CALCIUM 9.6 04/20/2017 0921   PROT 7.6 04/20/2017 0921   ALBUMIN 3.8 04/20/2017 0921   AST 21 04/20/2017 0921   ALT 19 04/20/2017 0921   ALKPHOS 71 04/20/2017 0921   BILITOT 0.34 04/20/2017 0921    No results found for: SPEP, UPEP  Lab Results  Component Value Date   WBC 5.9 04/20/2017   NEUTROABS 4.2 04/20/2017   HGB 11.8 04/20/2017   HCT 38.7 04/20/2017   MCV 94.4 04/20/2017   PLT 366 04/20/2017      Chemistry      Component Value Date/Time   NA 140 04/20/2017 0921   K 4.5 04/20/2017 0921   CO2 25 04/20/2017 0921   BUN 13.5 04/20/2017 0921   CREATININE 0.8 04/20/2017 0921      Component Value Date/Time   CALCIUM 9.6 04/20/2017 0921   ALKPHOS 71 04/20/2017 0921   AST 21 04/20/2017 0921   ALT 19 04/20/2017 0921   BILITOT 0.34 04/20/2017 0921       RADIOGRAPHIC STUDIES: I have reviewed imaging study with the patient I have personally reviewed the radiological images as listed and agreed with the findings in the report. 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 EXAM: CT CHEST, ABDOMEN, AND PELVIS WITH  CONTRAST TECHNIQUE: Multidetector CT imaging of the chest, abdomen and pelvis was performed following the standard protocol during bolus administration of intravenous contrast. CONTRAST:  143mL ISOVUE-300 IOPAMIDOL (ISOVUE-300) INJECTION 61% COMPARISON:  None. FINDINGS: CT CHEST FINDINGS Cardiovascular: The heart is normal in size. No pericardial effusion. No evidence of thoracic aortic aneurysm. Mild atherosclerotic calcifications of the aortic arch. Mediastinum/Nodes: No suspicious mediastinal, hilar, or axillary lymphadenopathy. Visualized thyroid is unremarkable. Lungs/Pleura: 7 mm subpleural nodule in the anterior right upper lobe (series 5/image 63). Lungs are otherwise clear. No pleural effusion or pneumothorax. Musculoskeletal: Suspected asymmetric glandular tissue along the lateral right breast (series 2/ image 27; series 6/ image 112). Degenerative changes of the thoracic spine. Moderate inferior endplate compression fracture deformity at T12 with mild retropulsion and associated sclerosis (sagittal image 129). CT ABDOMEN PELVIS FINDINGS Hepatobiliary: Liver is within normal limits. Status post cholecystectomy. Pancreas: Fatty parenchymal replacement. Spleen: Within normal limits. Adrenals/Urinary Tract: Adrenal glands are within normal limits. Kidneys are within normal limits.  No hydronephrosis. Bladder is underdistended but unremarkable. Stomach/Bowel: Stomach is within normal limits. No evidence of bowel obstruction. Normal appendix (series 2/image 97). Vascular/Lymphatic: No evidence of abdominal aortic aneurysm. Atherosclerotic calcifications of the abdominal aorta and branch vessels. No suspicious abdominopelvic lymphadenopathy. Reproductive: Uterus is within normal limits. Left ovary is within normal limits. 7.5 x 6.8 cm right ovarian mass with fat, fluid, and calcifications, compatible with a benign ovarian dermoid (series 2/ image 100). Other: No abdominopelvic ascites. Peritoneal disease with  omental caking beneath the left anterior abdominal wall, measuring approximately 2.2 x 17.6 cm (series 2/ image 84). Musculoskeletal: Degenerative changes of the lumbar spine. 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 Peritoneal disease with omental caking beneath the left anterior abdominal wall, as described above. 7 mm anterior right upper lobe pulmonary nodule, isolated pulmonary metastasis not excluded. Attention on follow-up is suggested. Suspected asymmetric glandular tissue along the lateral right breast. Correlate with mammography. Statistically, GYN and GI malignancies are the most common underlying etiology of peritoneal disease, although primary perineal malignancy or hematogenous spread of an extra-abdominal primary (breast, lung, melanoma) are also  possible. In this patient, an obvious GYN, GI, or lung primary is not visualized, noting that stomach/colon primaries can occasionally be CT-occult. Electronically Signed   By: Julian Hy M.D.   On: 04/18/2017 10:56   Ct Abdomen Pelvis W Contrast  Result Date: 04/18/2017 CLINICAL DATA:  Right ovarian mass on exploratory laparotomy last week, peritoneal carcinomatosis. EXAM: CT CHEST, ABDOMEN, AND PELVIS WITH CONTRAST TECHNIQUE: Multidetector CT imaging of the chest, abdomen and pelvis was performed following the standard protocol during bolus administration of intravenous contrast. CONTRAST:  134mL ISOVUE-300 IOPAMIDOL (ISOVUE-300) INJECTION 61% COMPARISON:  None. FINDINGS: CT CHEST FINDINGS Cardiovascular: The heart is normal in size. No pericardial effusion. No evidence of thoracic aortic aneurysm. Mild atherosclerotic calcifications of the aortic arch. Mediastinum/Nodes: No suspicious mediastinal, hilar, or axillary lymphadenopathy. Visualized thyroid is unremarkable. Lungs/Pleura: 7 mm subpleural nodule in the anterior right upper lobe (series 5/image 63). Lungs are otherwise clear. No pleural  effusion or pneumothorax. Musculoskeletal: Suspected asymmetric glandular tissue along the lateral right breast (series 2/ image 27; series 6/ image 112). Degenerative changes of the thoracic spine. Moderate inferior endplate compression fracture deformity at T12 with mild retropulsion and associated sclerosis (sagittal image 129). CT ABDOMEN PELVIS FINDINGS Hepatobiliary: Liver is within normal limits. Status post cholecystectomy. Pancreas: Fatty parenchymal replacement. Spleen: Within normal limits. Adrenals/Urinary Tract: Adrenal glands are within normal limits. Kidneys are within normal limits.  No hydronephrosis. Bladder is underdistended but unremarkable. Stomach/Bowel: Stomach is within normal limits. No evidence of bowel obstruction. Normal appendix (series 2/image 97). Vascular/Lymphatic: No evidence of abdominal aortic aneurysm. Atherosclerotic calcifications of the abdominal aorta and branch vessels. No suspicious abdominopelvic lymphadenopathy. Reproductive: Uterus is within normal limits. Left ovary is within normal limits. 7.5 x 6.8 cm right ovarian mass with fat, fluid, and calcifications, compatible with a benign ovarian dermoid (series 2/ image 100). Other: No abdominopelvic ascites. Peritoneal disease with omental caking beneath the left anterior abdominal wall, measuring approximately 2.2 x 17.6 cm (series 2/ image 84). Musculoskeletal: Degenerative changes of the lumbar spine. 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 Peritoneal disease with omental caking beneath the left anterior abdominal wall, as described above. 7 mm anterior right upper lobe pulmonary nodule, isolated pulmonary metastasis not excluded. Attention on follow-up is suggested. Suspected asymmetric glandular tissue along the lateral right breast. Correlate with mammography. Statistically, GYN and GI malignancies are the most common underlying etiology of peritoneal disease, although  primary perineal malignancy or hematogenous spread of an extra-abdominal primary (breast, lung, melanoma) are also possible. In this patient, an obvious GYN, GI, or lung primary is not visualized, noting that stomach/colon primaries can occasionally be CT-occult. Electronically Signed   By: Julian Hy M.D.   On: 04/18/2017 10:56    ASSESSMENT & PLAN:  Ovarian cancer on right [facility]) We discussed the importance of biopsy. The patient is having CT-guided biopsy in 2 days. If confirmed malignancy, based on imaging study and clinical findings, the patient likely have right ovarian cancer with peritoneal carcinomatosis. We discussed the role of neoadjuvant chemotherapy. We reviewed the NCCN guidelines We discussed the role of chemotherapy. The intent is of curative intent.  We discussed some of the risks, benefits, side-effects of carboplatin & Taxol. Treatment is intravenous, every 3 weeks x 6 cycles  Some of the short term side-effects included, though not limited to, including weight loss, life threatening infections, risk of allergic reactions, need for transfusions of blood products, nausea, vomiting, change in bowel  habits, loss of hair, admission to hospital for various reasons, and risks of death.   Long term side-effects are also discussed including risks of infertility, permanent damage to nerve function, hearing loss, chronic fatigue, kidney damage with possibility needing hemodialysis, and rare secondary malignancy including bone marrow disorders.  The patient is aware that the response rates discussed earlier is not guaranteed.  After a long discussion, patient made an informed decision to proceed with the prescribed plan of care.   Patient education material was dispensed. We discussed premedication with dexamethasone before chemotherapy. The patient will get repeat imaging study after 3 cycles If she have excellent response to treatment, she will undergo interim surgical  debulking procedure We will follow her clinical progress with tumor markers, history and physical examination She will get growth factor support due to advanced age, comorbidities and advanced stage disease I will see her back prior to cycle 2 of treatment  Type 2 diabetes mellitus without complication ([place]) Patient is at risk for hyperglycemia and worsening peripheral neuropathy during treatment I recommend close monitoring of her blood sugar after the first few days of treatment Due to risk of severe hypoglycemia, she will only take 12 mg of dexamethasone the day before and the morning before chemotherapy  Drainage from wound She has mild wound drainage Her wound is unlikely to heal until chemotherapy For now, we will continue conservative approach with dressing changes only The wound is not infected and she does not need antibiotic therapy  Nodule of right lung The patient is not symptomatic I recommend surveillance imaging only It is indeterminate cause but unlikely to be metastatic   No orders of the defined types were placed in this encounter.  All questions were answered. The patient knows to call the clinic with any problems, questions or concerns. No barriers to learning was detected. I spent 30 minutes counseling the patient face to face. The total time spent in the appointment was 40 minutes and more than 50% was on counseling and review of test results     Heath Lark, MD 04/20/2017 2:13 PM

## 2017-04-21 ENCOUNTER — Ambulatory Visit (HOSPITAL_COMMUNITY): Payer: Medicare Other

## 2017-04-21 ENCOUNTER — Other Ambulatory Visit: Payer: Self-pay | Admitting: General Surgery

## 2017-04-21 ENCOUNTER — Encounter (HOSPITAL_COMMUNITY): Payer: Self-pay

## 2017-04-21 ENCOUNTER — Other Ambulatory Visit: Payer: Self-pay | Admitting: Student

## 2017-04-21 LAB — CA 125: CANCER ANTIGEN (CA) 125: 345.6 U/mL — AB (ref 0.0–38.1)

## 2017-04-22 ENCOUNTER — Encounter (HOSPITAL_COMMUNITY): Payer: Self-pay

## 2017-04-22 ENCOUNTER — Ambulatory Visit (HOSPITAL_COMMUNITY)
Admission: RE | Admit: 2017-04-22 | Discharge: 2017-04-22 | Disposition: A | Discharge status: Home / Self Care | Payer: Medicare Other | Source: Ambulatory Visit | Attending: Gynecologic Oncology | Admitting: Gynecologic Oncology

## 2017-04-22 DIAGNOSIS — C786 Secondary malignant neoplasm of retroperitoneum and peritoneum: Secondary | ICD-10-CM | POA: Diagnosis present

## 2017-04-22 DIAGNOSIS — E119 Type 2 diabetes mellitus without complications: Secondary | ICD-10-CM | POA: Insufficient documentation

## 2017-04-22 DIAGNOSIS — Z79899 Other long term (current) drug therapy: Secondary | ICD-10-CM | POA: Insufficient documentation

## 2017-04-22 DIAGNOSIS — C801 Malignant (primary) neoplasm, unspecified: Secondary | ICD-10-CM | POA: Diagnosis not present

## 2017-04-22 DIAGNOSIS — F419 Anxiety disorder, unspecified: Secondary | ICD-10-CM | POA: Insufficient documentation

## 2017-04-22 DIAGNOSIS — M810 Age-related osteoporosis without current pathological fracture: Secondary | ICD-10-CM | POA: Diagnosis not present

## 2017-04-22 DIAGNOSIS — K219 Gastro-esophageal reflux disease without esophagitis: Secondary | ICD-10-CM | POA: Insufficient documentation

## 2017-04-22 DIAGNOSIS — Z7984 Long term (current) use of oral hypoglycemic drugs: Secondary | ICD-10-CM | POA: Insufficient documentation

## 2017-04-22 DIAGNOSIS — D369 Benign neoplasm, unspecified site: Secondary | ICD-10-CM | POA: Insufficient documentation

## 2017-04-22 DIAGNOSIS — Z9049 Acquired absence of other specified parts of digestive tract: Secondary | ICD-10-CM | POA: Diagnosis not present

## 2017-04-22 LAB — CBC
HCT: 37.3 % (ref 36.0–46.0)
Hemoglobin: 11.3 g/dL — ABNORMAL LOW (ref 12.0–15.0)
MCH: 28.3 pg (ref 26.0–34.0)
MCHC: 30.3 g/dL (ref 30.0–36.0)
MCV: 93.3 fL (ref 78.0–100.0)
Platelets: 349 K/uL (ref 150–400)
RBC: 4 MIL/uL (ref 3.87–5.11)
RDW: 14.7 % (ref 11.5–15.5)
WBC: 5.5 K/uL (ref 4.0–10.5)

## 2017-04-22 LAB — PROTIME-INR
INR: 0.89
PROTHROMBIN TIME: 11.9 s (ref 11.4–15.2)

## 2017-04-22 LAB — GLUCOSE, CAPILLARY
Glucose-Capillary: 140 mg/dL — ABNORMAL HIGH (ref 65–99)
Glucose-Capillary: 110 mg/dL — ABNORMAL HIGH (ref 65–99)

## 2017-04-22 MED ORDER — SODIUM CHLORIDE 0.9 % IV SOLN
INTRAVENOUS | Status: AC | PRN
  Administered 2017-04-22: 10 mL/h via INTRAVENOUS

## 2017-04-22 MED ORDER — MIDAZOLAM HCL 2 MG/2ML IJ SOLN
INTRAMUSCULAR | Status: AC | PRN
  Administered 2017-04-22: 0.5 mg via INTRAVENOUS
  Administered 2017-04-22 (×2): 1 mg via INTRAVENOUS

## 2017-04-22 MED ORDER — FENTANYL CITRATE (PF) 100 MCG/2ML IJ SOLN
INTRAMUSCULAR | Status: AC
  Filled 2017-04-22: qty 4

## 2017-04-22 MED ORDER — FENTANYL CITRATE (PF) 100 MCG/2ML IJ SOLN
INTRAMUSCULAR | Status: AC | PRN
  Administered 2017-04-22: 50 ug via INTRAVENOUS
  Administered 2017-04-22 (×2): 25 ug via INTRAVENOUS

## 2017-04-22 MED ORDER — MIDAZOLAM HCL 2 MG/2ML IJ SOLN
INTRAMUSCULAR | Status: AC
  Filled 2017-04-22: qty 4

## 2017-04-22 MED ORDER — LIDOCAINE HCL 1 % IJ SOLN
INTRAMUSCULAR | Status: AC
  Filled 2017-04-22: qty 20

## 2017-04-22 MED ORDER — GELATIN ABSORBABLE 12-7 MM EX MISC
CUTANEOUS | Status: AC
  Filled 2017-04-22: qty 1

## 2017-04-22 MED ORDER — SODIUM CHLORIDE 0.9 % IV SOLN: INTRAVENOUS | Status: DC

## 2017-04-22 NOTE — Discharge Instructions (Addendum)
Needle Biopsy, Care After °Refer to this sheet in the next few weeks. These instructions provide you with information about caring for yourself after your procedure. Your health care provider may also give you more specific instructions. Your treatment has been planned according to current medical practices, but problems sometimes occur. Call your health care provider if you have any problems or questions after your procedure. °What can I expect after the procedure? °After your procedure, it is common to have soreness, bruising, or mild pain at the biopsy site. This should go away in a few days. °Follow these instructions at home: °· Rest as directed by your health care provider. °· Take medicines only as directed by your health care provider. °· There are many different ways to close and cover the biopsy site, including stitches (sutures), skin glue, and adhesive strips. Follow your health care provider's instructions about: °? Biopsy site care. °? Bandage (dressing) changes and removal. °? Biopsy site closure removal. °· Check your biopsy site every day for signs of infection. Watch for: °? Redness, swelling, or pain. °? Fluid, blood, or pus. °Contact a health care provider if: °· You have a fever. °· You have redness, swelling, or pain at the biopsy site that lasts longer than a few days. °· You have fluid, blood, or pus coming from the biopsy site. °· You feel nauseous. °· You vomit. °Get help right away if: °· You have shortness of breath. °· You have trouble breathing. °· You have chest pain. °· You feel dizzy or you faint. °· You have bleeding that does not stop with pressure or a bandage. °· You cough up blood. °· You have pain in your abdomen. °This information is not intended to replace advice given to you by your health care provider. Make sure you discuss any questions you have with your health care provider. °Document Released: 11/07/2014 Document Revised: 11/29/2015 Document Reviewed:  06/19/2014 °Elsevier Interactive Patient Education © 2018 [employer] °Moderate Conscious Sedation, Adult, Care After °These instructions provide you with information about caring for yourself after your procedure. Your health care provider may also give you more specific instructions. Your treatment has been planned according to current medical practices, but problems sometimes occur. Call your health care provider if you have any problems or questions after your procedure. °What can I expect after the procedure? °After your procedure, it is common: °· To feel sleepy for several hours. °· To feel clumsy and have poor balance for several hours. °· To have poor judgment for several hours. °· To vomit if you eat too soon. ° °Follow these instructions at home: °For at least 24 hours after the procedure: ° °· Do not: °? Participate in activities where you could fall or become injured. °? Drive. °? Use heavy machinery. °? Drink alcohol. °? Take sleeping pills or medicines that cause drowsiness. °? Make important decisions or sign legal documents. °? Take care of children on your own. °· Rest. °Eating and drinking °· Follow the diet recommended by your health care provider. °· If you vomit: °? Drink water, juice, or soup when you can drink without vomiting. °? Make sure you have little or no nausea before eating solid foods. °General instructions °· Have a responsible adult stay with you until you are awake and alert. °· Take over-the-counter and prescription medicines only as told by your health care provider. °· If you smoke, do not smoke without supervision. °· Keep all follow-up visits as told by your health care   provider. This is important. °Contact a health care provider if: °· You keep feeling nauseous or you keep vomiting. °· You feel light-headed. °· You develop a rash. °· You have a fever. °Get help right away if: °· You have trouble breathing. °This information is not intended to replace advice given to you  by your health care provider. Make sure you discuss any questions you have with your health care provider. °Document Released: 04/13/2013 Document Revised: 11/26/2015 Document Reviewed: 10/13/2015 °Elsevier Interactive Patient Education © 2018 [employer] ° °

## 2017-04-22 NOTE — H&P (Signed)
Chief Complaint: Patient was seen in consultation today for right adnexal mass  Referring Physician(s): [place]  Supervising Physician: Aletta Edouard  Patient Status: [facility] - [facility]  History of Present Illness: Cheryl Melton is a 66 y.o. female with past medical history of anxiety, DM, GERD, presents with newly found right adnexal mass.  Patient underwent laparoscopy with peritoneal biopsy at [facility] 04/10/17, however results did not show malignancy.  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   IR consulted for omental biospy at the request of Dr. [name]. Case reviewed and approved by Dr. Pascal Lux. Patient presents for procedure today in her usual state of health.  He has been NPO and does not take blood thinners.   Past Medical History:  Diagnosis Date  . Anxiety   . Cancer ([place])   . Diabetes mellitus without complication ([place])   . GERD (gastroesophageal reflux disease)   . Osteoporosis   . Parathyroid adenoma     Past Surgical History:  Procedure Laterality Date  . CHOLECYSTECTOMY    . COLONOSCOPY WITH ESOPHAGOGASTRODUODENOSCOPY (EGD)    . DILATION AND CURETTAGE OF UTERUS    . ELBOW SURGERY    . PARATHYROIDECTOMY  10/21/2016   One side done pt not sure which side.   Marland Kitchen ROTATOR CUFF REPAIR Right   . TONSILLECTOMY      Allergies: Aspirin; Nsaids; and Tramadol  Medications: Prior to Admission medications   Medication Sig Start Date End Date Taking? Authorizing Provider  acetaminophen (TYLENOL) 650 MG CR tablet Take 1,300 mg by mouth every 8 (eight) hours.    Yes [provider]  alendronate (FOSAMAX) 70 MG tablet Take 70 mg by mouth every Sunday.  09/22/16  Yes [provider]  atorvastatin (LIPITOR) 80 MG tablet Take 40 mg by mouth at bedtime.  01/29/17  Yes [provider]  Cholecalciferol (VITAMIN D3) 2000 units TABS Take 2,000 Units by mouth daily.   Yes  [provider]  citalopram (CELEXA) 10 MG tablet Take 10 mg by mouth at bedtime 04/10/17  Yes [provider]  dexamethasone (DECADRON) 4 MG tablet Take 3 tabs the night before chemo and 3 tabs in the morning of chemo with food, every 21 days Patient taking differently: Take 12 mg by mouth See admin instructions. Take 12 mg by mouth the night before chemo and 12 mg in the morning of chemo with food, every 21 days 04/20/17  Yes Gorsuch, Ni, MD  gabapentin (NEURONTIN) 300 MG capsule Take 300 mg by mouth every 8 (eight) hours.  03/30/17 06/28/17 Yes [provider]  Glucosamine Sulfate 500 MG TABS Take 500 mg by mouth 2 (two) times daily.    Yes [provider]  linagliptin (TRADJENTA) 5 MG TABS tablet Take 5 mg by mouth daily.    Yes [provider]  loratadine (CLARITIN) 10 MG tablet Take 10 mg by mouth at bedtime.    Yes [provider]  losartan (COZAAR) 25 MG tablet Take 25 mg by mouth daily.  02/04/17  Yes [provider]  metFORMIN (GLUCOPHAGE) 1000 MG tablet Take 1,000 mg by mouth 2 (two) times daily with a meal.  04/30/16  Yes [provider]  omeprazole (PRILOSEC) 40 MG capsule Take 40 mg by mouth daily.  03/20/17  Yes [provider]  ondansetron (ZOFRAN) 8 MG tablet Start on day 3 after chemo. Patient taking differently: Take 8 mg by mouth See admin  instructions. Take 8 mg by mouth as needed for nausea and vomiting, start on day 3 after chemo. 04/20/17  Yes Gorsuch, Ni, MD  pioglitazone (ACTOS) 15 MG tablet Take 15 mg by mouth at bedtime.  11/03/16  Yes [provider]  prochlorperazine (COMPAZINE) 10 MG tablet Take 1 tablet (10 mg total) by mouth every 6 (six) hours as needed (Nausea or vomiting). 04/20/17  Yes Gorsuch, Ni, MD  vitamin B-12 (CYANOCOBALAMIN) 1000 MCG tablet Take 1,000 mcg by mouth daily.    Yes [provider]  meclizine (ANTIVERT) 25 MG tablet Take 25 mg by mouth 2 (two) times  daily as needed for dizziness.  03/25/16   [provider]     Family History  Problem Relation Age of Onset  . Colon cancer Mother 39  . Prostate cancer Father 30    Social History   Social History  . Marital status: Married    Spouse name: Mikeal Hawthorne  . Number of children: 0  . Years of education: N/A   Occupational History  . retired    Social History Main Topics  . Smoking status: Never Smoker  . Smokeless tobacco: Never Used  . Alcohol use No  . Drug use: No  . Sexual activity: Not Asked   Other Topics Concern  . None   Social History Narrative  . None    Review of Systems  Constitutional: Negative for fatigue and fever.  Respiratory: Negative for cough and shortness of breath.   Cardiovascular: Negative for chest pain.  Gastrointestinal: Negative for abdominal pain.  Musculoskeletal: Negative for back pain.  Psychiatric/Behavioral: Negative for behavioral problems and confusion.    Vital Signs: BP 133/80   Pulse 72   Temp 97.9 F (36.6 C) (Oral)   Resp 18   Ht 5\' 7"  (1.702 m)   Wt 244 lb (110.7 kg)   SpO2 98%   BMI 38.22 kg/m   Physical Exam  Constitutional: She is oriented to person, place, and time. She appears well-developed.  Cardiovascular: Normal rate, regular rhythm and normal heart sounds.   Pulmonary/Chest: Effort normal and breath sounds normal. No respiratory distress.  Abdominal: Soft.  Neurological: She is alert and oriented to person, place, and time.  Skin: Skin is warm and dry.  Psychiatric: She has a normal mood and affect. Her behavior is normal. Judgment and thought content normal.  Nursing note and vitals reviewed.   Imaging: Ct Chest W Contrast  Result Date: 04/18/2017 CLINICAL DATA:  Right ovarian mass on exploratory laparotomy last week, peritoneal carcinomatosis. EXAM: CT CHEST, ABDOMEN, AND PELVIS WITH CONTRAST TECHNIQUE: Multidetector CT imaging of the chest, abdomen and pelvis was performed following the  standard protocol during bolus administration of intravenous contrast. CONTRAST:  15mL ISOVUE-300 IOPAMIDOL (ISOVUE-300) INJECTION 61% COMPARISON:  None. FINDINGS: CT CHEST FINDINGS Cardiovascular: The heart is normal in size. No pericardial effusion. No evidence of thoracic aortic aneurysm. Mild atherosclerotic calcifications of the aortic arch. Mediastinum/Nodes: No suspicious mediastinal, hilar, or axillary lymphadenopathy. Visualized thyroid is unremarkable. Lungs/Pleura: 7 mm subpleural nodule in the anterior right upper lobe (series 5/image 63). Lungs are otherwise clear. No pleural effusion or pneumothorax. Musculoskeletal: Suspected asymmetric glandular tissue along the lateral right breast (series 2/ image 27; series 6/ image 112). Degenerative changes of the thoracic spine. Moderate inferior endplate compression fracture deformity at T12 with mild retropulsion and associated sclerosis (sagittal image 129). CT ABDOMEN PELVIS FINDINGS Hepatobiliary: Liver is within normal limits. Status post cholecystectomy. Pancreas: Fatty parenchymal replacement.  Spleen: Within normal limits. Adrenals/Urinary Tract: Adrenal glands are within normal limits. Kidneys are within normal limits.  No hydronephrosis. Bladder is underdistended but unremarkable. Stomach/Bowel: Stomach is within normal limits. No evidence of bowel obstruction. Normal appendix (series 2/image 97). Vascular/Lymphatic: No evidence of abdominal aortic aneurysm. Atherosclerotic calcifications of the abdominal aorta and branch vessels. No suspicious abdominopelvic lymphadenopathy. Reproductive: Uterus is within normal limits. Left ovary is within normal limits. 7.5 x 6.8 cm right ovarian mass with fat, fluid, and calcifications, compatible with a benign ovarian dermoid (series 2/ image 100). Other: No abdominopelvic ascites. Peritoneal disease with omental caking beneath the left anterior abdominal wall, measuring approximately 2.2 x 17.6 cm (series 2/  image 84). Musculoskeletal: Degenerative changes of the lumbar spine. 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 Peritoneal disease with omental caking beneath the left anterior abdominal wall, as described above. 7 mm anterior right upper lobe pulmonary nodule, isolated pulmonary metastasis not excluded. Attention on follow-up is suggested. Suspected asymmetric glandular tissue along the lateral right breast. Correlate with mammography. Statistically, GYN and GI malignancies are the most common underlying etiology of peritoneal disease, although primary perineal malignancy or hematogenous spread of an extra-abdominal primary (breast, lung, melanoma) are also possible. In this patient, an obvious GYN, GI, or lung primary is not visualized, noting that stomach/colon primaries can occasionally be CT-occult. Electronically Signed   By: Julian Hy M.D.   On: 04/18/2017 10:56   Ct Abdomen Pelvis W Contrast  Result Date: 04/18/2017 CLINICAL DATA:  Right ovarian mass on exploratory laparotomy last week, peritoneal carcinomatosis. EXAM: CT CHEST, ABDOMEN, AND PELVIS WITH CONTRAST TECHNIQUE: Multidetector CT imaging of the chest, abdomen and pelvis was performed following the standard protocol during bolus administration of intravenous contrast. CONTRAST:  154mL ISOVUE-300 IOPAMIDOL (ISOVUE-300) INJECTION 61% COMPARISON:  None. FINDINGS: CT CHEST FINDINGS Cardiovascular: The heart is normal in size. No pericardial effusion. No evidence of thoracic aortic aneurysm. Mild atherosclerotic calcifications of the aortic arch. Mediastinum/Nodes: No suspicious mediastinal, hilar, or axillary lymphadenopathy. Visualized thyroid is unremarkable. Lungs/Pleura: 7 mm subpleural nodule in the anterior right upper lobe (series 5/image 63). Lungs are otherwise clear. No pleural effusion or pneumothorax. Musculoskeletal: Suspected asymmetric glandular tissue along the lateral right breast  (series 2/ image 27; series 6/ image 112). Degenerative changes of the thoracic spine. Moderate inferior endplate compression fracture deformity at T12 with mild retropulsion and associated sclerosis (sagittal image 129). CT ABDOMEN PELVIS FINDINGS Hepatobiliary: Liver is within normal limits. Status post cholecystectomy. Pancreas: Fatty parenchymal replacement. Spleen: Within normal limits. Adrenals/Urinary Tract: Adrenal glands are within normal limits. Kidneys are within normal limits.  No hydronephrosis. Bladder is underdistended but unremarkable. Stomach/Bowel: Stomach is within normal limits. No evidence of bowel obstruction. Normal appendix (series 2/image 97). Vascular/Lymphatic: No evidence of abdominal aortic aneurysm. Atherosclerotic calcifications of the abdominal aorta and branch vessels. No suspicious abdominopelvic lymphadenopathy. Reproductive: Uterus is within normal limits. Left ovary is within normal limits. 7.5 x 6.8 cm right ovarian mass with fat, fluid, and calcifications, compatible with a benign ovarian dermoid (series 2/ image 100). Other: No abdominopelvic ascites. Peritoneal disease with omental caking beneath the left anterior abdominal wall, measuring approximately 2.2 x 17.6 cm (series 2/ image 84). Musculoskeletal: Degenerative changes of the lumbar spine. 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 Peritoneal disease with omental caking beneath the left anterior abdominal wall, as described above. 7 mm anterior right upper lobe pulmonary  nodule, isolated pulmonary metastasis not excluded. Attention on follow-up is suggested. Suspected asymmetric glandular tissue along the lateral right breast. Correlate with mammography. Statistically, GYN and GI malignancies are the most common underlying etiology of peritoneal disease, although primary perineal malignancy or hematogenous spread of an extra-abdominal primary (breast, lung, melanoma) are also  possible. In this patient, an obvious GYN, GI, or lung primary is not visualized, noting that stomach/colon primaries can occasionally be CT-occult. Electronically Signed   By: Julian Hy M.D.   On: 04/18/2017 10:56    Labs:  CBC:  Recent Labs  04/13/17 1626 04/20/17 0921 04/22/17 0615  WBC 5.6 5.9 5.5  HGB 11.9 11.8 11.3*  HCT 36.9 38.7 37.3  PLT 340 366 349    COAGS:  Recent Labs  04/22/17 0615  INR 0.89    BMP:  Recent Labs  04/13/17 1626 04/20/17 0921  NA 140 140  K 4.2 4.5  CO2 26 25  GLUCOSE 135 133  BUN 13.1 13.5  CALCIUM 9.8 9.6  CREATININE 0.9 0.8    LIVER FUNCTION TESTS:  Recent Labs  04/20/17 0921  BILITOT 0.34  AST 21  ALT 19  ALKPHOS 71  PROT 7.6  ALBUMIN 3.8    TUMOR MARKERS: No results for input(s): AFPTM, CEA, CA199, CHROMGRNA in the last 8760 hours.  Assessment and Plan: Patient with past medical history of right adnexal mass and suspected ovarian cancer presents for repeat biopsy of her omentum.  IR consulted for biopsy at the request of Dr. [name]. Case reviewed by Dr. Pascal Lux who approves patient for procedure.  Patient presents today in their usual state of health.  She has been NPO and is not currently on blood thinners.  Risks and benefits discussed with the patient including, but not limited to bleeding, infection, damage to adjacent structures or low yield requiring additional tests. All of the patient's questions were answered, patient is agreeable to proceed. Consent signed and in chart.  Thank you for this interesting consult.  I greatly enjoyed meeting Cheryl Melton and look forward to participating in their care.  A copy of this report was sent to the requesting provider on this date.  Electronically Signed: Docia Barrier, PA 04/22/2017, 7:48 AM   I spent a total of  30 Minutes   in face to face in clinical consultation, greater than 50% of which was counseling/coordinating care for adnexal  mass

## 2017-04-22 NOTE — Procedures (Signed)
Interventional Radiology Procedure Note  Procedure: CT guided biopsy of omental/peritoneal mass  Complications: None  Estimated Blood Loss: < 10 mL  18 G core biopsy x 3 via 17 G needle of left abdominal peritoneal tumor. Gelfoam slurry utilized post biopsy.  Venetia Night. Kathlene Cote, M.D Pager:  [phone]

## 2017-04-23 ENCOUNTER — Telehealth: Payer: Self-pay

## 2017-04-23 ENCOUNTER — Telehealth: Payer: Self-pay | Admitting: Gynecologic Oncology

## 2017-04-23 NOTE — Telephone Encounter (Signed)
Patient advised of path results.  All questions or concerns.

## 2017-04-23 NOTE — Telephone Encounter (Signed)
Spoke with the patient to clarify zofran use.  Clarified instructions with Dr. [name] and discussed with patient.  Advised to use compazine on day 1 and day 2 after chemo if needed then she could use zofran starting day 3 as needed.  Verbalizing understanding.

## 2017-04-23 NOTE — Telephone Encounter (Signed)
Called with below message, verbalized understanding. 

## 2017-04-23 NOTE — Telephone Encounter (Signed)
-----   Message from Heath Lark, MD sent at 04/23/2017  2:24 PM EDT ----- Regarding: biopsy positive for cancer PLs let her know pathology is positive Will proceed with chemo as prescribed ----- Message ----- From: Interface, Lab In [facility] [place] [facility] Sent: 04/23/2017   2:18 PM To: Heath Lark, MD

## 2017-04-24 ENCOUNTER — Ambulatory Visit (HOSPITAL_BASED_OUTPATIENT_CLINIC_OR_DEPARTMENT_OTHER): Payer: Medicare Other | Admitting: Medical

## 2017-04-24 ENCOUNTER — Ambulatory Visit (HOSPITAL_BASED_OUTPATIENT_CLINIC_OR_DEPARTMENT_OTHER): Payer: Medicare Other

## 2017-04-24 VITALS — BP 139/73 | HR 98 | Temp 99.0°F | Resp 16

## 2017-04-24 DIAGNOSIS — C561 Malignant neoplasm of right ovary: Secondary | ICD-10-CM

## 2017-04-24 DIAGNOSIS — T8090XA Unspecified complication following infusion and therapeutic injection, initial encounter: Secondary | ICD-10-CM

## 2017-04-24 DIAGNOSIS — Z5111 Encounter for antineoplastic chemotherapy: Secondary | ICD-10-CM | POA: Diagnosis present

## 2017-04-24 MED ORDER — FAMOTIDINE IN NACL 20-0.9 MG/50ML-% IV SOLN
20.0000 mg | Freq: Once | INTRAVENOUS | Status: AC | PRN
  Administered 2017-04-24: 20 mg via INTRAVENOUS

## 2017-04-24 MED ORDER — FAMOTIDINE IN NACL 20-0.9 MG/50ML-% IV SOLN
INTRAVENOUS | Status: AC
  Filled 2017-04-24: qty 50

## 2017-04-24 MED ORDER — METHYLPREDNISOLONE SODIUM SUCC 125 MG IJ SOLR
125.0000 mg | Freq: Once | INTRAMUSCULAR | Status: AC | PRN
  Administered 2017-04-24: 125 mg via INTRAVENOUS

## 2017-04-24 MED ORDER — FAMOTIDINE IN NACL 20-0.9 MG/50ML-% IV SOLN
20.0000 mg | Freq: Once | INTRAVENOUS | Status: AC
  Administered 2017-04-24: 20 mg via INTRAVENOUS

## 2017-04-24 MED ORDER — SODIUM CHLORIDE 0.9 % IV SOLN
Freq: Once | INTRAVENOUS | Status: AC
  Administered 2017-04-24: 11:00:00 via INTRAVENOUS

## 2017-04-24 MED ORDER — PALONOSETRON HCL INJECTION 0.25 MG/5ML
0.2500 mg | Freq: Once | INTRAVENOUS | Status: AC
  Administered 2017-04-24: 0.25 mg via INTRAVENOUS

## 2017-04-24 MED ORDER — SODIUM CHLORIDE 0.9 % IV SOLN
175.0000 mg/m2 | Freq: Once | INTRAVENOUS | Status: AC
  Administered 2017-04-24: 402 mg via INTRAVENOUS
  Filled 2017-04-24: qty 67

## 2017-04-24 MED ORDER — SODIUM CHLORIDE 0.9 % IV SOLN
Freq: Once | INTRAVENOUS | Status: AC
  Administered 2017-04-24: 12:00:00 via INTRAVENOUS
  Filled 2017-04-24: qty 5

## 2017-04-24 MED ORDER — ALBUTEROL SULFATE (2.5 MG/3ML) 0.083% IN NEBU
2.5000 mg | INHALATION_SOLUTION | Freq: Once | RESPIRATORY_TRACT | Status: AC | PRN
  Administered 2017-04-24: 2.5 mg via RESPIRATORY_TRACT
  Filled 2017-04-24: qty 3

## 2017-04-24 MED ORDER — DIPHENHYDRAMINE HCL 50 MG/ML IJ SOLN
INTRAMUSCULAR | Status: AC
  Filled 2017-04-24: qty 1

## 2017-04-24 MED ORDER — PALONOSETRON HCL INJECTION 0.25 MG/5ML
INTRAVENOUS | Status: AC
  Filled 2017-04-24: qty 5

## 2017-04-24 MED ORDER — CARBOPLATIN CHEMO INJECTION 600 MG/60ML
748.2000 mg | Freq: Once | INTRAVENOUS | Status: AC
  Administered 2017-04-24: 750 mg via INTRAVENOUS
  Filled 2017-04-24: qty 75

## 2017-04-24 MED ORDER — DIPHENHYDRAMINE HCL 50 MG/ML IJ SOLN
50.0000 mg | Freq: Once | INTRAMUSCULAR | Status: AC
  Administered 2017-04-24: 50 mg via INTRAVENOUS

## 2017-04-24 NOTE — Patient Instructions (Addendum)
[facility] Discharge Instructions for Patients Receiving Chemotherapy  Today you received the following chemotherapy agents Taxol and Carboplatin.   To help prevent nausea and vomiting after your treatment, we encourage you to take your nausea medication as directed.   If you develop nausea and vomiting that is not controlled by your nausea medication, call the clinic.   BELOW ARE SYMPTOMS THAT SHOULD BE REPORTED IMMEDIATELY:  *FEVER GREATER THAN 100.5 F  *CHILLS WITH OR WITHOUT FEVER  NAUSEA AND VOMITING THAT IS NOT CONTROLLED WITH YOUR NAUSEA MEDICATION  *UNUSUAL SHORTNESS OF BREATH  *UNUSUAL BRUISING OR BLEEDING  TENDERNESS IN MOUTH AND THROAT WITH OR WITHOUT PRESENCE OF ULCERS  *URINARY PROBLEMS  *BOWEL PROBLEMS  UNUSUAL RASH Items with * indicate a potential emergency and should be followed up as soon as possible.  Feel free to call the clinic should you have any questions or concerns. The clinic phone number is [phone].  Please show the CHEMO ALERT CARD at check-in to the Emergency Department and triage nurse.   Paclitaxel injection What is this medicine? PACLITAXEL (PAK li TAX el) is a chemotherapy drug. It targets fast dividing cells, like cancer cells, and causes these cells to die. This medicine is used to treat ovarian cancer, breast cancer, and other cancers. This medicine may be used for other purposes; ask your health care provider or pharmacist if you have questions. COMMON BRAND NAME(S): Onxol, Taxol What should I tell my health care provider before I take this medicine? They need to know if you have any of these conditions: -blood disorders -irregular heartbeat -infection (especially a virus infection such as chickenpox, cold sores, or herpes) -liver disease -previous or ongoing radiation therapy -an unusual or allergic reaction to paclitaxel, alcohol, polyoxyethylated castor oil, other chemotherapy agents, other medicines, foods,  dyes, or preservatives -pregnant or trying to get pregnant -breast-feeding How should I use this medicine? This drug is given as an infusion into a vein. It is administered in a hospital or clinic by a specially trained health care professional. Talk to your pediatrician regarding the use of this medicine in children. Special care may be needed. Overdosage: If you think you have taken too much of this medicine contact a poison control center or emergency room at once. NOTE: This medicine is only for you. Do not share this medicine with others. What if I miss a dose? It is important not to miss your dose. Call your doctor or health care professional if you are unable to keep an appointment. What may interact with this medicine? Do not take this medicine with any of the following medications: -disulfiram -metronidazole This medicine may also interact with the following medications: -cyclosporine -diazepam -ketoconazole -medicines to increase blood counts like filgrastim, pegfilgrastim, sargramostim -other chemotherapy drugs like cisplatin, doxorubicin, epirubicin, etoposide, teniposide, vincristine -quinidine -testosterone -vaccines -verapamil Talk to your doctor or health care professional before taking any of these medicines: -acetaminophen -aspirin -ibuprofen -ketoprofen -naproxen This list may not describe all possible interactions. Give your health care provider a list of all the medicines, herbs, non-prescription drugs, or dietary supplements you use. Also tell them if you smoke, drink alcohol, or use illegal drugs. Some items may interact with your medicine. What should I watch for while using this medicine? Your condition will be monitored carefully while you are receiving this medicine. You will need important blood work done while you are taking this medicine. This medicine can cause serious allergic reactions. To reduce your risk you   will need to take other medicine(s)  before treatment with this medicine. If you experience allergic reactions like skin rash, itching or hives, swelling of the face, lips, or tongue, tell your doctor or health care professional right away. In some cases, you may be given additional medicines to help with side effects. Follow all directions for their use. This drug may make you feel generally unwell. This is not uncommon, as chemotherapy can affect healthy cells as well as cancer cells. Report any side effects. Continue your course of treatment even though you feel ill unless your doctor tells you to stop. Call your doctor or health care professional for advice if you get a fever, chills or sore throat, or other symptoms of a cold or flu. Do not treat yourself. This drug decreases your body's ability to fight infections. Try to avoid being around people who are sick. This medicine may increase your risk to bruise or bleed. Call your doctor or health care professional if you notice any unusual bleeding. Be careful brushing and flossing your teeth or using a toothpick because you may get an infection or bleed more easily. If you have any dental work done, tell your dentist you are receiving this medicine. Avoid taking products that contain aspirin, acetaminophen, ibuprofen, naproxen, or ketoprofen unless instructed by your doctor. These medicines may hide a fever. Do not become pregnant while taking this medicine. Women should inform their doctor if they wish to become pregnant or think they might be pregnant. There is a potential for serious side effects to an unborn child. Talk to your health care professional or pharmacist for more information. Do not breast-feed an infant while taking this medicine. Men are advised not to father a child while receiving this medicine. This product may contain alcohol. Ask your pharmacist or healthcare provider if this medicine contains alcohol. Be sure to tell all healthcare providers you are taking this  medicine. Certain medicines, like metronidazole and disulfiram, can cause an unpleasant reaction when taken with alcohol. The reaction includes flushing, headache, nausea, vomiting, sweating, and increased thirst. The reaction can last from 30 minutes to several hours. What side effects may I notice from receiving this medicine? Side effects that you should report to your doctor or health care professional as soon as possible: -allergic reactions like skin rash, itching or hives, swelling of the face, lips, or tongue -low blood counts - This drug may decrease the number of white blood cells, red blood cells and platelets. You may be at increased risk for infections and bleeding. -signs of infection - fever or chills, cough, sore throat, pain or difficulty passing urine -signs of decreased platelets or bleeding - bruising, pinpoint red spots on the skin, black, tarry stools, nosebleeds -signs of decreased red blood cells - unusually weak or tired, fainting spells, lightheadedness -breathing problems -chest pain -high or low blood pressure -mouth sores -nausea and vomiting -pain, swelling, redness or irritation at the injection site -pain, tingling, numbness in the hands or feet -slow or irregular heartbeat -swelling of the ankle, feet, hands Side effects that usually do not require medical attention (report to your doctor or health care professional if they continue or are bothersome): -bone pain -complete hair loss including hair on your head, underarms, pubic hair, eyebrows, and eyelashes -changes in the color of fingernails -diarrhea -loosening of the fingernails -loss of appetite -muscle or joint pain -red flush to skin -sweating This list may not describe all possible side effects. Call your doctor for   medical advice about side effects. You may report side effects to FDA at 1-800-FDA-1088. Where should I keep my medicine? This drug is given in a hospital or clinic and will not be  stored at home. NOTE: This sheet is a summary. It may not cover all possible information. If you have questions about this medicine, talk to your doctor, pharmacist, or health care provider.  2018 [employer] (2015-04-24 19:58:00)   Carboplatin injection What is this medicine? CARBOPLATIN (KAR boe pla tin) is a chemotherapy drug. It targets fast dividing cells, like cancer cells, and causes these cells to die. This medicine is used to treat ovarian cancer and many other cancers. This medicine may be used for other purposes; ask your health care provider or pharmacist if you have questions. COMMON BRAND NAME(S): Paraplatin What should I tell my health care provider before I take this medicine? They need to know if you have any of these conditions: -blood disorders -hearing problems -kidney disease -recent or ongoing radiation therapy -an unusual or allergic reaction to carboplatin, cisplatin, other chemotherapy, other medicines, foods, dyes, or preservatives -pregnant or trying to get pregnant -breast-feeding How should I use this medicine? This drug is usually given as an infusion into a vein. It is administered in a hospital or clinic by a specially trained health care professional. Talk to your pediatrician regarding the use of this medicine in children. Special care may be needed. Overdosage: If you think you have taken too much of this medicine contact a poison control center or emergency room at once. NOTE: This medicine is only for you. Do not share this medicine with others. What if I miss a dose? It is important not to miss a dose. Call your doctor or health care professional if you are unable to keep an appointment. What may interact with this medicine? -medicines for seizures -medicines to increase blood counts like filgrastim, pegfilgrastim, sargramostim -some antibiotics like amikacin, gentamicin, neomycin, streptomycin, tobramycin -vaccines Talk to your doctor  or health care professional before taking any of these medicines: -acetaminophen -aspirin -ibuprofen -ketoprofen -naproxen This list may not describe all possible interactions. Give your health care provider a list of all the medicines, herbs, non-prescription drugs, or dietary supplements you use. Also tell them if you smoke, drink alcohol, or use illegal drugs. Some items may interact with your medicine. What should I watch for while using this medicine? Your condition will be monitored carefully while you are receiving this medicine. You will need important blood work done while you are taking this medicine. This drug may make you feel generally unwell. This is not uncommon, as chemotherapy can affect healthy cells as well as cancer cells. Report any side effects. Continue your course of treatment even though you feel ill unless your doctor tells you to stop. In some cases, you may be given additional medicines to help with side effects. Follow all directions for their use. Call your doctor or health care professional for advice if you get a fever, chills or sore throat, or other symptoms of a cold or flu. Do not treat yourself. This drug decreases your body's ability to fight infections. Try to avoid being around people who are sick. This medicine may increase your risk to bruise or bleed. Call your doctor or health care professional if you notice any unusual bleeding. Be careful brushing and flossing your teeth or using a toothpick because you may get an infection or bleed more easily. If you have any dental work done,   tell your dentist you are receiving this medicine. Avoid taking products that contain aspirin, acetaminophen, ibuprofen, naproxen, or ketoprofen unless instructed by your doctor. These medicines may hide a fever. Do not become pregnant while taking this medicine. Women should inform their doctor if they wish to become pregnant or think they might be pregnant. There is a potential  for serious side effects to an unborn child. Talk to your health care professional or pharmacist for more information. Do not breast-feed an infant while taking this medicine. What side effects may I notice from receiving this medicine? Side effects that you should report to your doctor or health care professional as soon as possible: -allergic reactions like skin rash, itching or hives, swelling of the face, lips, or tongue -signs of infection - fever or chills, cough, sore throat, pain or difficulty passing urine -signs of decreased platelets or bleeding - bruising, pinpoint red spots on the skin, black, tarry stools, nosebleeds -signs of decreased red blood cells - unusually weak or tired, fainting spells, lightheadedness -breathing problems -changes in hearing -changes in vision -chest pain -high blood pressure -low blood counts - This drug may decrease the number of white blood cells, red blood cells and platelets. You may be at increased risk for infections and bleeding. -nausea and vomiting -pain, swelling, redness or irritation at the injection site -pain, tingling, numbness in the hands or feet -problems with balance, talking, walking -trouble passing urine or change in the amount of urine Side effects that usually do not require medical attention (report to your doctor or health care professional if they continue or are bothersome): -hair loss -loss of appetite -metallic taste in the mouth or changes in taste This list may not describe all possible side effects. Call your doctor for medical advice about side effects. You may report side effects to FDA at 1-800-FDA-1088. Where should I keep my medicine? This drug is given in a hospital or clinic and will not be stored at home. NOTE: This sheet is a summary. It may not cover all possible information. If you have questions about this medicine, talk to your doctor, pharmacist, or health care provider.  2018 [employer] Standard  (2007-09-28 14:38:05)  

## 2017-04-24 NOTE — Progress Notes (Signed)
[facility] Progress Note   Cheryl Melton [id number] 12/11/1950 66 y.o.  Cheryl Melton is managed by Dr. [name]  Actively treated with chemotherapy: yes  Current Therapy: carboplatin and Taxol  Last Treated: 10 / 19 / 2018  Assessment: Plan:    Infusion reaction, initial encounter   Infusion reaction: patient's Taxol was paused. She was given Pepcid 20 mg IV, Solu-Medrol 125 mg IV, and an albuterol nebulizer. Her Taxol was restarted per protocol. The patient had no additional reactions and was able to complete her therapy.  Please see After Visit Summary for patient specific instructions.  Future Appointments Date Time Provider [place]  04/27/2017 2:00 PM [facility] INJ NURSE [facility] None  05/15/2017 8:30 AM [facility] LAB 5 [facility] None  05/15/2017 9:00 AM Gorsuch, Ni, MD [facility] None  05/15/2017 10:00 AM [facility] F20 [facility] None    No orders of the defined types were placed in this encounter.     Subjective:   Patient ID:  Cheryl Melton is a 66 y.o. (DOB 12-21-1950) female.  Chief Complaint: No chief complaint on file.   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 The patient was on her bump up of paclitaxel when she developed a heaviness in her chest and abdomen. The patient was not able to clearly describe this sensation but only state that she did not feel well. He was not having shortness of breath, chest tightness, trouble swallowing, or dyspnea. Taxol was cause. The patient was given Pepcid 20 mg IV, Solu-Medrol 125 mg IV and an albuterol nebulizer along with O2 via nasal cannula. The patient's symptoms resolved. She was able to restart her chemotherapy and complete her treatment today with out further issues of concern.  Medications: I have reviewed the  patient's current medications.  Allergies:  Allergies  Allergen Reactions  . Aspirin Other (See Comments)    NSAIDS( advised not to take per hematology)  . Nsaids Other (See Comments)    Gastritis  . Tramadol Other (See Comments)    Headache, vomiting and disorientation     Past Medical History:  Diagnosis Date  . Anxiety   . Cancer ([place])   . Diabetes mellitus without complication ([place])   . GERD (gastroesophageal reflux disease)   . Osteoporosis   . Parathyroid adenoma     Past Surgical History:  Procedure Laterality Date  . CHOLECYSTECTOMY    . COLONOSCOPY WITH ESOPHAGOGASTRODUODENOSCOPY (EGD)    . DILATION AND CURETTAGE OF UTERUS    . ELBOW SURGERY    . PARATHYROIDECTOMY  10/21/2016   One side done pt not sure which side.   Marland Kitchen ROTATOR CUFF REPAIR Right   . TONSILLECTOMY      Family History  Problem Relation Age of Onset  . Colon cancer Mother 67  . Prostate cancer Father 40    Social History   Social History  . Marital status: Married    Spouse name: Mikeal Hawthorne  . Number of children: 0  . Years of education: N/A   Occupational History  . retired    Social History Main Topics  . Smoking status: Never Smoker  . Smokeless tobacco: Never Used  . Alcohol use No  . Drug use: No  . Sexual activity: Not on file   Other Topics Concern  . Not on file   Social History Narrative  .  No narrative on file    Past Medical History, Surgical history, Social history, and Family history were reviewed and updated as appropriate.   Please see review of systems for further details on the patient's review from today.   Review of Systems:  Review of Systems  Constitutional: Negative for diaphoresis and fever.  HENT: Negative for trouble swallowing and voice change.   Respiratory: Negative for cough, chest tightness, shortness of breath and wheezing.   Cardiovascular: Negative for chest pain and palpitations.       Heaviness in the chest  Gastrointestinal: Negative for  abdominal pain, nausea and vomiting.       Heaviness in the abdomen    Objective:   Physical Exam:  Vital Signs:  BP: 119 / 61 Pulse: 109 Respiration: 16 SPO2: 97%  There were no vitals taken for this visit. ECOG: 0  Physical Exam  Constitutional: No distress.  HENT:  Head: Normocephalic and atraumatic.  Cardiovascular: S1 normal and S2 normal.  Tachycardia present.   No murmur heard. Pulmonary/Chest: Effort normal and breath sounds normal. No respiratory distress. She has no wheezes. She has no rales.  Musculoskeletal: She exhibits no edema.  Neurological: She is alert.  Skin: Skin is warm and dry. No rash noted. She is not diaphoretic. No erythema.    Lab Review:     Component Value Date/Time   NA 140 04/20/2017 0921   K 4.5 04/20/2017 0921   CO2 25 04/20/2017 0921   GLUCOSE 133 04/20/2017 0921   BUN 13.5 04/20/2017 0921   CREATININE 0.8 04/20/2017 0921   CALCIUM 9.6 04/20/2017 0921   PROT 7.6 04/20/2017 0921   ALBUMIN 3.8 04/20/2017 0921   AST 21 04/20/2017 0921   ALT 19 04/20/2017 0921   ALKPHOS 71 04/20/2017 0921   BILITOT 0.34 04/20/2017 0921       Component Value Date/Time   WBC 5.5 04/22/2017 0615   RBC 4.00 04/22/2017 0615   HGB 11.3 (L) 04/22/2017 0615   HGB 11.8 04/20/2017 0921   HCT 37.3 04/22/2017 0615   HCT 38.7 04/20/2017 0921   PLT 349 04/22/2017 0615   PLT 366 04/20/2017 0921   MCV 93.3 04/22/2017 0615   MCV 94.4 04/20/2017 0921   MCH 28.3 04/22/2017 0615   MCHC 30.3 04/22/2017 0615   RDW 14.7 04/22/2017 0615   RDW 14.5 04/20/2017 0921   LYMPHSABS 1.1 04/20/2017 0921   MONOABS 0.4 04/20/2017 0921   EOSABS 0.2 04/20/2017 0921   BASOSABS 0.0 04/20/2017 0921   -------------------------------  Imaging from last 24 hours (if applicable):  Radiology interpretation: Ct Chest W Contrast  Result Date: 04/18/2017 CLINICAL DATA:  Right ovarian mass on exploratory laparotomy last week, peritoneal carcinomatosis. EXAM: CT CHEST, ABDOMEN,  AND PELVIS WITH CONTRAST TECHNIQUE: Multidetector CT imaging of the chest, abdomen and pelvis was performed following the standard protocol during bolus administration of intravenous contrast. CONTRAST:  168mL ISOVUE-300 IOPAMIDOL (ISOVUE-300) INJECTION 61% COMPARISON:  None. FINDINGS: CT CHEST FINDINGS Cardiovascular: The heart is normal in size. No pericardial effusion. No evidence of thoracic aortic aneurysm. Mild atherosclerotic calcifications of the aortic arch. Mediastinum/Nodes: No suspicious mediastinal, hilar, or axillary lymphadenopathy. Visualized thyroid is unremarkable. Lungs/Pleura: 7 mm subpleural nodule in the anterior right upper lobe (series 5/image 63). Lungs are otherwise clear. No pleural effusion or pneumothorax. Musculoskeletal: Suspected asymmetric glandular tissue along the lateral right breast (series 2/ image 27; series 6/ image 112). Degenerative changes of the thoracic spine. Moderate inferior endplate compression fracture deformity  at T12 with mild retropulsion and associated sclerosis (sagittal image 129). CT ABDOMEN PELVIS FINDINGS Hepatobiliary: Liver is within normal limits. Status post cholecystectomy. Pancreas: Fatty parenchymal replacement. Spleen: Within normal limits. Adrenals/Urinary Tract: Adrenal glands are within normal limits. Kidneys are within normal limits.  No hydronephrosis. Bladder is underdistended but unremarkable. Stomach/Bowel: Stomach is within normal limits. No evidence of bowel obstruction. Normal appendix (series 2/image 97). Vascular/Lymphatic: No evidence of abdominal aortic aneurysm. Atherosclerotic calcifications of the abdominal aorta and branch vessels. No suspicious abdominopelvic lymphadenopathy. Reproductive: Uterus is within normal limits. Left ovary is within normal limits. 7.5 x 6.8 cm right ovarian mass with fat, fluid, and calcifications, compatible with a benign ovarian dermoid (series 2/ image 100). Other: No abdominopelvic ascites. Peritoneal  disease with omental caking beneath the left anterior abdominal wall, measuring approximately 2.2 x 17.6 cm (series 2/ image 84). Musculoskeletal: Degenerative changes of the lumbar spine. 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 Peritoneal disease with omental caking beneath the left anterior abdominal wall, as described above. 7 mm anterior right upper lobe pulmonary nodule, isolated pulmonary metastasis not excluded. Attention on follow-up is suggested. Suspected asymmetric glandular tissue along the lateral right breast. Correlate with mammography. Statistically, GYN and GI malignancies are the most common underlying etiology of peritoneal disease, although primary perineal malignancy or hematogenous spread of an extra-abdominal primary (breast, lung, melanoma) are also possible. In this patient, an obvious GYN, GI, or lung primary is not visualized, noting that stomach/colon primaries can occasionally be CT-occult. Electronically Signed   By: Julian Hy M.D.   On: 04/18/2017 10:56   Ct Abdomen Pelvis W Contrast  Result Date: 04/18/2017 CLINICAL DATA:  Right ovarian mass on exploratory laparotomy last week, peritoneal carcinomatosis. EXAM: CT CHEST, ABDOMEN, AND PELVIS WITH CONTRAST TECHNIQUE: Multidetector CT imaging of the chest, abdomen and pelvis was performed following the standard protocol during bolus administration of intravenous contrast. CONTRAST:  157mL ISOVUE-300 IOPAMIDOL (ISOVUE-300) INJECTION 61% COMPARISON:  None. FINDINGS: CT CHEST FINDINGS Cardiovascular: The heart is normal in size. No pericardial effusion. No evidence of thoracic aortic aneurysm. Mild atherosclerotic calcifications of the aortic arch. Mediastinum/Nodes: No suspicious mediastinal, hilar, or axillary lymphadenopathy. Visualized thyroid is unremarkable. Lungs/Pleura: 7 mm subpleural nodule in the anterior right upper lobe (series 5/image 63). Lungs are otherwise clear. No  pleural effusion or pneumothorax. Musculoskeletal: Suspected asymmetric glandular tissue along the lateral right breast (series 2/ image 27; series 6/ image 112). Degenerative changes of the thoracic spine. Moderate inferior endplate compression fracture deformity at T12 with mild retropulsion and associated sclerosis (sagittal image 129). CT ABDOMEN PELVIS FINDINGS Hepatobiliary: Liver is within normal limits. Status post cholecystectomy. Pancreas: Fatty parenchymal replacement. Spleen: Within normal limits. Adrenals/Urinary Tract: Adrenal glands are within normal limits. Kidneys are within normal limits.  No hydronephrosis. Bladder is underdistended but unremarkable. Stomach/Bowel: Stomach is within normal limits. No evidence of bowel obstruction. Normal appendix (series 2/image 97). Vascular/Lymphatic: No evidence of abdominal aortic aneurysm. Atherosclerotic calcifications of the abdominal aorta and branch vessels. No suspicious abdominopelvic lymphadenopathy. Reproductive: Uterus is within normal limits. Left ovary is within normal limits. 7.5 x 6.8 cm right ovarian mass with fat, fluid, and calcifications, compatible with a benign ovarian dermoid (series 2/ image 100). Other: No abdominopelvic ascites. Peritoneal disease with omental caking beneath the left anterior abdominal wall, measuring approximately 2.2 x 17.6 cm (series 2/ image 84). Musculoskeletal: Degenerative changes of the lumbar spine. 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 Peritoneal disease with omental caking beneath the left anterior abdominal wall, as described above. 7 mm anterior right upper lobe pulmonary nodule, isolated pulmonary metastasis not excluded. Attention on follow-up is suggested. Suspected asymmetric glandular tissue along the lateral right breast. Correlate with mammography. Statistically, GYN and GI malignancies are the most common underlying etiology of peritoneal disease,  although primary perineal malignancy or hematogenous spread of an extra-abdominal primary (breast, lung, melanoma) are also possible. In this patient, an obvious GYN, GI, or lung primary is not visualized, noting that stomach/colon primaries can occasionally be CT-occult. Electronically Signed   By: Julian Hy M.D.   On: 04/18/2017 10:56   Ct Biopsy  Result Date: 04/22/2017 CLINICAL DATA:  Ovarian mass and evidence by CT of peritoneal carcinomatosis with anterior omental tumor. EXAM: CT GUIDED CORE BIOPSY OF PERITONEAL MASS ANESTHESIA/SEDATION: 2.5 mg IV Versed; 100 mcg IV Fentanyl Total Moderate Sedation Time:  23 minutes. The patient's level of consciousness and physiologic status were continuously monitored during the procedure by Radiology nursing. PROCEDURE: The procedure risks, benefits, and alternatives were explained to the patient. Questions regarding the procedure were encouraged and answered. The patient understands and consents to the procedure. A time-out was performed prior to initiating the procedure. The abdominal wall was prepped with chlorhexidine in a sterile fashion, and a sterile drape was applied covering the operative field. A sterile gown and sterile gloves were used for the procedure. Local anesthesia was provided with 1% Lidocaine. CT was performed through the lower abdomen and pelvis in a supine position. Under CT guidance, a 17 gauge trocar needle was advanced to the level of left lower quadrant peritoneal tumor. Three separate coaxial 18 gauge core biopsy samples were obtained and submitted in formalin. A slurry of Gel-Foam pledgets was injected via the 17 gauge needle as it was retracted. COMPLICATIONS: None FINDINGS: Tumor in the anterior left peritoneal cavity just deep to the abdominal wall was targeted. Biopsy yielded solid tissue. IMPRESSION: CT-guided core biopsy performed of peritoneal tumor in the left lower quadrant. Electronically Signed   By: Aletta Edouard M.D.    On: 04/22/2017 10:25

## 2017-04-24 NOTE — Progress Notes (Signed)
@   1335, patient began c/o "I'm just not feeling well all over."  States she is having pain in abdomen and chest. Taxol infusion paused. Hypersensitivity protocol initiated and Sandi Mealy, PA-C came to infusion room to see patient. Medicated per orders (see MAR). Patient later returned to baseline and Taxol infusion resumed at 50% of previous rate x 30 minutes. Remained stable during infusion and titration resumed without further incident.

## 2017-04-27 ENCOUNTER — Ambulatory Visit (HOSPITAL_BASED_OUTPATIENT_CLINIC_OR_DEPARTMENT_OTHER): Payer: Medicare Other

## 2017-04-27 ENCOUNTER — Telehealth: Payer: Self-pay

## 2017-04-27 VITALS — BP 116/65 | HR 98 | Temp 98.4°F | Resp 18

## 2017-04-27 DIAGNOSIS — Z5189 Encounter for other specified aftercare: Secondary | ICD-10-CM | POA: Diagnosis not present

## 2017-04-27 DIAGNOSIS — C561 Malignant neoplasm of right ovary: Secondary | ICD-10-CM

## 2017-04-27 MED ORDER — PEGFILGRASTIM INJECTION 6 MG/0.6ML ~~LOC~~
6.0000 mg | PREFILLED_SYRINGE | Freq: Once | SUBCUTANEOUS | Status: AC
  Administered 2017-04-27: 6 mg via SUBCUTANEOUS
  Filled 2017-04-27: qty 0.6

## 2017-04-27 NOTE — Telephone Encounter (Signed)
-----   Message from Gershon Mussel, RN sent at 04/24/2017 11:54 AM EDT ----- Regarding: Dr [name] first chemo f/u call Needs F/U call after first day of taxol/carbo. Thank you

## 2017-04-27 NOTE — Patient Instructions (Signed)
Pegfilgrastim injection What is this medicine? PEGFILGRASTIM (PEG fil gra stim) is a long-acting granulocyte colony-stimulating factor that stimulates the growth of neutrophils, a type of white blood cell important in the body's fight against infection. It is used to reduce the incidence of fever and infection in patients with certain types of cancer who are receiving chemotherapy that affects the bone marrow, and to increase survival after being exposed to high doses of radiation. This medicine may be used for other purposes; ask your health care provider or pharmacist if you have questions. COMMON BRAND NAME(S): Neulasta What should I tell my health care provider before I take this medicine? They need to know if you have any of these conditions: -kidney disease -latex allergy -ongoing radiation therapy -sickle cell disease -skin reactions to acrylic adhesives (On-Body Injector only) -an unusual or allergic reaction to pegfilgrastim, filgrastim, other medicines, foods, dyes, or preservatives -pregnant or trying to get pregnant -breast-feeding How should I use this medicine? This medicine is for injection under the skin. If you get this medicine at home, you will be taught how to prepare and give the pre-filled syringe or how to use the On-body Injector. Refer to the patient Instructions for Use for detailed instructions. Use exactly as directed. Tell your healthcare provider immediately if you suspect that the On-body Injector may not have performed as intended or if you suspect the use of the On-body Injector resulted in a missed or partial dose. It is important that you put your used needles and syringes in a special sharps container. Do not put them in a trash can. If you do not have a sharps container, call your pharmacist or healthcare provider to get one. Talk to your pediatrician regarding the use of this medicine in children. While this drug may be prescribed for selected conditions,  precautions do apply. Overdosage: If you think you have taken too much of this medicine contact a poison control center or emergency room at once. NOTE: This medicine is only for you. Do not share this medicine with others. What if I miss a dose? It is important not to miss your dose. Call your doctor or health care professional if you miss your dose. If you miss a dose due to an On-body Injector failure or leakage, a new dose should be administered as soon as possible using a single prefilled syringe for manual use. What may interact with this medicine? Interactions have not been studied. Give your health care provider a list of all the medicines, herbs, non-prescription drugs, or dietary supplements you use. Also tell them if you smoke, drink alcohol, or use illegal drugs. Some items may interact with your medicine. This list may not describe all possible interactions. Give your health care provider a list of all the medicines, herbs, non-prescription drugs, or dietary supplements you use. Also tell them if you smoke, drink alcohol, or use illegal drugs. Some items may interact with your medicine. What should I watch for while using this medicine? You may need blood work done while you are taking this medicine. If you are going to need a MRI, CT scan, or other procedure, tell your doctor that you are using this medicine (On-Body Injector only). What side effects may I notice from receiving this medicine? Side effects that you should report to your doctor or health care professional as soon as possible: -allergic reactions like skin rash, itching or hives, swelling of the face, lips, or tongue -dizziness -fever -pain, redness, or irritation at site   where injected -pinpoint red spots on the skin -red or dark-brown urine -shortness of breath or breathing problems -stomach or side pain, or pain at the shoulder -swelling -tiredness -trouble passing urine or change in the amount of urine Side  effects that usually do not require medical attention (report to your doctor or health care professional if they continue or are bothersome): -bone pain -muscle pain This list may not describe all possible side effects. Call your doctor for medical advice about side effects. You may report side effects to FDA at 1-800-FDA-1088. Where should I keep my medicine? Keep out of the reach of children. Store pre-filled syringes in a refrigerator between 2 and 8 degrees C (36 and 46 degrees F). Do not freeze. Keep in carton to protect from light. Throw away this medicine if it is left out of the refrigerator for more than 48 hours. Throw away any unused medicine after the expiration date. NOTE: This sheet is a summary. It may not cover all possible information. If you have questions about this medicine, talk to your doctor, pharmacist, or health care provider.  2018 Elsevier/Gold Standard (2016-06-19 12:58:03)  

## 2017-04-27 NOTE — Telephone Encounter (Signed)
Called patient for chemo follow up. States that she is doing great. A little achy over the weekend. Has a  2 pm appt for injection today. Instructed to call for questions or concerns.

## 2017-05-15 ENCOUNTER — Ambulatory Visit (HOSPITAL_BASED_OUTPATIENT_CLINIC_OR_DEPARTMENT_OTHER): Payer: Medicare Other | Admitting: Hematology and Oncology

## 2017-05-15 ENCOUNTER — Encounter: Payer: Self-pay | Admitting: Hematology and Oncology

## 2017-05-15 ENCOUNTER — Ambulatory Visit (HOSPITAL_BASED_OUTPATIENT_CLINIC_OR_DEPARTMENT_OTHER): Payer: Medicare Other

## 2017-05-15 ENCOUNTER — Telehealth: Payer: Self-pay | Admitting: Hematology and Oncology

## 2017-05-15 ENCOUNTER — Other Ambulatory Visit (HOSPITAL_BASED_OUTPATIENT_CLINIC_OR_DEPARTMENT_OTHER): Payer: Medicare Other

## 2017-05-15 VITALS — BP 146/78 | HR 104 | Temp 98.0°F | Resp 18 | Ht 67.0 in | Wt 241.7 lb

## 2017-05-15 DIAGNOSIS — C561 Malignant neoplasm of right ovary: Secondary | ICD-10-CM | POA: Diagnosis present

## 2017-05-15 DIAGNOSIS — E119 Type 2 diabetes mellitus without complications: Secondary | ICD-10-CM | POA: Diagnosis not present

## 2017-05-15 DIAGNOSIS — T451X5A Adverse effect of antineoplastic and immunosuppressive drugs, initial encounter: Secondary | ICD-10-CM

## 2017-05-15 DIAGNOSIS — Z5111 Encounter for antineoplastic chemotherapy: Secondary | ICD-10-CM | POA: Diagnosis present

## 2017-05-15 DIAGNOSIS — D6481 Anemia due to antineoplastic chemotherapy: Secondary | ICD-10-CM

## 2017-05-15 DIAGNOSIS — C569 Malignant neoplasm of unspecified ovary: Secondary | ICD-10-CM

## 2017-05-15 LAB — COMPREHENSIVE METABOLIC PANEL
ALBUMIN: 3.9 g/dL (ref 3.5–5.0)
ALK PHOS: 95 U/L (ref 40–150)
ALT: 44 U/L (ref 0–55)
AST: 25 U/L (ref 5–34)
Anion Gap: 14 mEq/L — ABNORMAL HIGH (ref 3–11)
BUN: 17.2 mg/dL (ref 7.0–26.0)
CO2: 21 meq/L — AB (ref 22–29)
Calcium: 9.5 mg/dL (ref 8.4–10.4)
Chloride: 103 mEq/L (ref 98–109)
Creatinine: 0.8 mg/dL (ref 0.6–1.1)
EGFR: >60 mL/min/{1.73_m2} (ref >60)
GLUCOSE: 240 mg/dL — AB (ref 70–140)
POTASSIUM: 3.9 meq/L (ref 3.5–5.1)
SODIUM: 138 meq/L (ref 136–145)
Total Bilirubin: 0.46 mg/dL (ref 0.20–1.20)
Total Protein: 7.7 g/dL (ref 6.4–8.3)

## 2017-05-15 LAB — CBC WITH DIFFERENTIAL/PLATELET
BASO%: 0.1 % (ref 0.0–2.0)
BASOS ABS: 0 10*3/uL (ref 0.0–0.1)
EOS ABS: 0 10*3/uL (ref 0.0–0.5)
EOS%: 0 % (ref 0.0–7.0)
HEMATOCRIT: 34.8 % (ref 34.8–46.6)
HGB: 10.9 g/dL — ABNORMAL LOW (ref 11.6–15.9)
LYMPH#: 0.6 10*3/uL — AB (ref 0.9–3.3)
LYMPH%: 7.6 % — AB (ref 14.0–49.7)
MCH: 28.9 pg (ref 25.1–34.0)
MCHC: 31.3 g/dL — AB (ref 31.5–36.0)
MCV: 92.3 fL (ref 79.5–101.0)
MONO#: 0.1 10*3/uL (ref 0.1–0.9)
MONO%: 0.8 % (ref 0.0–14.0)
NEUT#: 7.2 10*3/uL — ABNORMAL HIGH (ref 1.5–6.5)
NEUT%: 91.5 % — AB (ref 38.4–76.8)
PLATELETS: 294 10*3/uL (ref 145–400)
RBC: 3.77 10*6/uL (ref 3.70–5.45)
RDW: 15.8 % — ABNORMAL HIGH (ref 11.2–14.5)
WBC: 7.9 10*3/uL (ref 3.9–10.3)

## 2017-05-15 MED ORDER — SODIUM CHLORIDE 0.9 % IV SOLN
175.0000 mg/m2 | Freq: Once | INTRAVENOUS | Status: AC
  Administered 2017-05-15: 402 mg via INTRAVENOUS
  Filled 2017-05-15: qty 67

## 2017-05-15 MED ORDER — DIPHENHYDRAMINE HCL 50 MG/ML IJ SOLN
INTRAMUSCULAR | Status: AC
  Filled 2017-05-15: qty 1

## 2017-05-15 MED ORDER — DIPHENHYDRAMINE HCL 50 MG/ML IJ SOLN
50.0000 mg | Freq: Once | INTRAMUSCULAR | Status: AC
  Administered 2017-05-15: 50 mg via INTRAVENOUS

## 2017-05-15 MED ORDER — PALONOSETRON HCL INJECTION 0.25 MG/5ML
0.2500 mg | Freq: Once | INTRAVENOUS | Status: AC
  Administered 2017-05-15: 0.25 mg via INTRAVENOUS

## 2017-05-15 MED ORDER — PALONOSETRON HCL INJECTION 0.25 MG/5ML
INTRAVENOUS | Status: AC
  Filled 2017-05-15: qty 5

## 2017-05-15 MED ORDER — FAMOTIDINE IN NACL 20-0.9 MG/50ML-% IV SOLN
INTRAVENOUS | Status: AC
  Filled 2017-05-15: qty 50

## 2017-05-15 MED ORDER — FOSAPREPITANT DIMEGLUMINE INJECTION 150 MG
Freq: Once | INTRAVENOUS | Status: AC
  Administered 2017-05-15: 11:00:00 via INTRAVENOUS
  Filled 2017-05-15: qty 5

## 2017-05-15 MED ORDER — SODIUM CHLORIDE 0.9 % IV SOLN
748.2000 mg | Freq: Once | INTRAVENOUS | Status: AC
  Administered 2017-05-15: 750 mg via INTRAVENOUS
  Filled 2017-05-15: qty 75

## 2017-05-15 MED ORDER — FAMOTIDINE IN NACL 20-0.9 MG/50ML-% IV SOLN
20.0000 mg | Freq: Once | INTRAVENOUS | Status: AC
  Administered 2017-05-15: 20 mg via INTRAVENOUS

## 2017-05-15 MED ORDER — SODIUM CHLORIDE 0.9 % IV SOLN
Freq: Once | INTRAVENOUS | Status: AC
  Administered 2017-05-15: 11:00:00 via INTRAVENOUS

## 2017-05-15 NOTE — Progress Notes (Signed)
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       03/20/2017 Imaging    GYN ultrasound was performed this revealed a small fundus measuring 5 cm there are 2 small fibroids within the fundus with the largest measuring 1.3 cm Within the endometrium there is a small amount a fluid the endometrial wall was slightly thickened at 4.8 mm the ultrasound was consistent with a small endometrial polyp  The right adnexa measured 7.4 x 6.7 cm contained a both a cystic and solid mass with a small amount of shadowing This is consistent with a dermoid tumor the left ovary was 2.6 x 1.5 x 1.0 cm There is no other adnexal masses no free fluid within the abdomen no ascites          04/10/2017 Surgery    Operation: Operative Laparscopy with  Peritoneal biopsy 2 hysteroscopy D&C Surgeon: Lynder Parents MD  Specimens: Peritoneal biopsy and cellular washings  Complications: None FIndings Small amount of ascites was found cellular washings were sent There was peritoneal studding And omental caking Even in steep Trendelenburg the ovaries and uterus were unable to be visualized due to the small intestines in the lower pelvis          04/10/2017 Pathology Results    Biopsy from [facility] was nondiagnostic.  Peritoneum biopsy did not reveal malignancy.  Endometrial biopsy also did not reveal malignancy.      04/13/2017 Tumor Marker    Patient's tumor was tested for the following markers: CA-125 Results of the tumor marker test revealed 324.1      04/20/2017 Tumor Marker    Patient's tumor was  tested for the following markers: CA-125 Results of the tumor marker test revealed 345.6      04/22/2017 Pathology Results    Peritoneum, biopsy - CARCINOMA. - SEE COMMENT.      04/22/2017 Procedure    CT-guided core biopsy performed of peritoneal tumor in the left lower quadrant.      04/24/2017 -  Chemotherapy    She received carboplatin and Taxol       INTERVAL HISTORY: Please see below for problem oriented charting. Her husband, Inocente Salles was present during the visit along with her sister, Velva Harman She returns for further follow-up She has a slight infusion reaction with cycle 1, resolved with additional premedications She denies nausea, vomiting, changes in bowel habits or peripheral neuropathy No recent infection She felt better after chemo and felt that the swelling of her abdomen is improving  REVIEW OF SYSTEMS:   Constitutional: Denies fevers, chills or abnormal weight loss Eyes: Denies blurriness of vision Ears, nose, mouth, throat, and face: Denies mucositis or sore throat Respiratory: Denies cough, dyspnea or wheezes Cardiovascular: Denies palpitation, chest discomfort or lower extremity swelling Gastrointestinal:  Denies nausea, heartburn or change in bowel habits Skin: Denies abnormal skin rashes Lymphatics: Denies new lymphadenopathy or easy bruising Neurological:Denies numbness, tingling or new weaknesses Behavioral/Psych: Mood is stable, no new changes  All other systems were reviewed with the patient and are negative.  I have reviewed the past medical  history, past surgical history, social history and family history with the patient and they are unchanged from previous note.  ALLERGIES:  is allergic to aspirin; nsaids; and tramadol.  MEDICATIONS:  Current Outpatient Medications  Medication Sig Dispense Refill  . acetaminophen (TYLENOL) 650 MG CR tablet Take 1,300 mg by mouth every 8 (eight) hours.     Marland Kitchen alendronate (FOSAMAX) 70 MG tablet Take 70 mg by mouth  every Sunday.     Marland Kitchen atorvastatin (LIPITOR) 80 MG tablet Take 40 mg by mouth at bedtime.     . Cholecalciferol (VITAMIN D3) 2000 units TABS Take 2,000 Units by mouth daily.    . citalopram (CELEXA) 10 MG tablet Take 10 mg by mouth at bedtime    . dexamethasone (DECADRON) 4 MG tablet Take 3 tabs the night before chemo and 3 tabs in the morning of chemo with food, every 21 days (Patient taking differently: Take 12 mg by mouth See admin instructions. Take 12 mg by mouth the night before chemo and 12 mg in the morning of chemo with food, every 21 days) 36 tablet 1  . gabapentin (NEURONTIN) 300 MG capsule Take 300 mg by mouth every 8 (eight) hours.     . Glucosamine Sulfate 500 MG TABS Take 500 mg by mouth 2 (two) times daily.     Marland Kitchen linagliptin (TRADJENTA) 5 MG TABS tablet Take 5 mg by mouth daily.     Marland Kitchen loratadine (CLARITIN) 10 MG tablet Take 10 mg by mouth at bedtime.     Marland Kitchen losartan (COZAAR) 25 MG tablet Take 25 mg by mouth daily.     . meclizine (ANTIVERT) 25 MG tablet Take 25 mg by mouth 2 (two) times daily as needed for dizziness.     . metFORMIN (GLUCOPHAGE) 1000 MG tablet Take 1,000 mg by mouth 2 (two) times daily with a meal.     . omeprazole (PRILOSEC) 40 MG capsule Take 40 mg by mouth daily.     . ondansetron (ZOFRAN) 8 MG tablet Start on day 3 after chemo. (Patient taking differently: Take 8 mg by mouth See admin instructions. Take 8 mg by mouth as needed for nausea and vomiting, start on day 3 after chemo.) 30 tablet 1  . pioglitazone (ACTOS) 15 MG tablet Take 15 mg by mouth at bedtime.     . prochlorperazine (COMPAZINE) 10 MG tablet Take 1 tablet (10 mg total) by mouth every 6 (six) hours as needed (Nausea or vomiting). 30 tablet 1  . vitamin B-12 (CYANOCOBALAMIN) 1000 MCG tablet Take 1,000 mcg by mouth daily.      No current facility-administered medications for this visit.    Facility-Administered Medications Ordered in Other Visits  Medication Dose Route Frequency Provider Last Rate  Last Dose  . CARBOplatin (PARAPLATIN) 750 mg in sodium chloride 0.9 % 250 mL chemo infusion  750 mg Intravenous Once [name], Shaylea Ucci, MD      . PACLitaxel (TAXOL) 402 mg in sodium chloride 0.9 % 500 mL chemo infusion (> 80mg /m2)  175 mg/m2 (Treatment Plan Recorded) Intravenous Once Heath Lark, MD 189 mL/hr at 05/15/17 1145 402 mg at 05/15/17 1145    PHYSICAL EXAMINATION: ECOG PERFORMANCE STATUS: 1 - Symptomatic but completely ambulatory  Vitals:   05/15/17 0902  BP: (!) 146/78  Pulse: (!) 104  Resp: 18  Temp: 98 F (36.7 C)  SpO2: 97%   Filed Weights   05/15/17 0902  Weight: 241 lb 11.2 oz (109.6 kg)    GENERAL:alert, no distress  and comfortable SKIN: skin color, texture, turgor are normal, no rashes or significant lesions EYES: normal, Conjunctiva are pink and non-injected, sclera clear OROPHARYNX:no exudate, no erythema and lips, buccal mucosa, and tongue normal  NECK: supple, thyroid normal size, non-tender, without nodularity LYMPH:  no palpable lymphadenopathy in the cervical, axillary or inguinal LUNGS: clear to auscultation and percussion with normal breathing effort HEART: regular rate & rhythm and no murmurs and no lower extremity edema ABDOMEN:abdomen soft, non-tender and normal bowel sounds Musculoskeletal:no cyanosis of digits and no clubbing  NEURO: alert & oriented x 3 with fluent speech, no focal motor/sensory deficits  LABORATORY DATA:  I have reviewed the data as listed    Component Value Date/Time   NA 138 05/15/2017 0822   K 3.9 05/15/2017 0822   CO2 21 (L) 05/15/2017 0822   GLUCOSE 240 (H) 05/15/2017 0822   BUN 17.2 05/15/2017 0822   CREATININE 0.8 05/15/2017 0822   CALCIUM 9.5 05/15/2017 0822   PROT 7.7 05/15/2017 0822   ALBUMIN 3.9 05/15/2017 0822   AST 25 05/15/2017 0822   ALT 44 05/15/2017 0822   ALKPHOS 95 05/15/2017 0822   BILITOT 0.46 05/15/2017 0822    No results found for: SPEP, UPEP  Lab Results  Component Value Date   WBC 7.9  05/15/2017   NEUTROABS 7.2 (H) 05/15/2017   HGB 10.9 (L) 05/15/2017   HCT 34.8 05/15/2017   MCV 92.3 05/15/2017   PLT 294 05/15/2017      Chemistry      Component Value Date/Time   NA 138 05/15/2017 0822   K 3.9 05/15/2017 0822   CO2 21 (L) 05/15/2017 0822   BUN 17.2 05/15/2017 0822   CREATININE 0.8 05/15/2017 0822      Component Value Date/Time   CALCIUM 9.5 05/15/2017 0822   ALKPHOS 95 05/15/2017 0822   AST 25 05/15/2017 0822   ALT 44 05/15/2017 0822   BILITOT 0.46 05/15/2017 0822       RADIOGRAPHIC STUDIES: I have personally reviewed the radiological images as listed and agreed with the findings in the report. 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 EXAM: CT CHEST, ABDOMEN, AND PELVIS WITH CONTRAST TECHNIQUE: Multidetector CT imaging of the chest, abdomen and pelvis was performed following the standard protocol during bolus administration of intravenous contrast. CONTRAST:  112mL ISOVUE-300 IOPAMIDOL (ISOVUE-300) INJECTION 61% COMPARISON:  None. FINDINGS: CT CHEST FINDINGS Cardiovascular: The heart is normal in size. No pericardial effusion. No evidence of thoracic aortic aneurysm. Mild atherosclerotic calcifications of the aortic arch. Mediastinum/Nodes: No suspicious mediastinal, hilar, or axillary lymphadenopathy. Visualized thyroid is unremarkable. Lungs/Pleura: 7 mm subpleural nodule in the anterior right upper lobe (series 5/image 63). Lungs are otherwise clear. No pleural effusion or pneumothorax. Musculoskeletal: Suspected asymmetric glandular tissue along the lateral right breast (series 2/ image 27; series 6/ image 112). Degenerative changes of the thoracic spine. Moderate inferior endplate compression fracture deformity at T12 with mild retropulsion and associated sclerosis (sagittal image 129). CT ABDOMEN PELVIS FINDINGS Hepatobiliary: Liver is within normal limits.  Status post cholecystectomy. Pancreas: Fatty parenchymal replacement. Spleen: Within normal limits. Adrenals/Urinary Tract: Adrenal glands are within normal limits. Kidneys are within normal limits.  No hydronephrosis. Bladder is underdistended but unremarkable. Stomach/Bowel: Stomach is within normal limits. No evidence of bowel obstruction. Normal appendix (series 2/image 97). Vascular/Lymphatic: No evidence of abdominal aortic aneurysm. Atherosclerotic calcifications of the abdominal aorta and branch vessels. No  suspicious abdominopelvic lymphadenopathy. Reproductive: Uterus is within normal limits. Left ovary is within normal limits. 7.5 x 6.8 cm right ovarian mass with fat, fluid, and calcifications, compatible with a benign ovarian dermoid (series 2/ image 100). Other: No abdominopelvic ascites. Peritoneal disease with omental caking beneath the left anterior abdominal wall, measuring approximately 2.2 x 17.6 cm (series 2/ image 84). Musculoskeletal: Degenerative changes of the lumbar spine. 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 Peritoneal disease with omental caking beneath the left anterior abdominal wall, as described above. 7 mm anterior right upper lobe pulmonary nodule, isolated pulmonary metastasis not excluded. Attention on follow-up is suggested. Suspected asymmetric glandular tissue along the lateral right breast. Correlate with mammography. Statistically, GYN and GI malignancies are the most common underlying etiology of peritoneal disease, although primary perineal malignancy or hematogenous spread of an extra-abdominal primary (breast, lung, melanoma) are also possible. In this patient, an obvious GYN, GI, or lung primary is not visualized, noting that stomach/colon primaries can occasionally be CT-occult. Electronically Signed   By: Julian Hy M.D.   On: 04/18/2017 10:56   Ct Abdomen Pelvis W Contrast  Result Date: 04/18/2017 CLINICAL  DATA:  Right ovarian mass on exploratory laparotomy last week, peritoneal carcinomatosis. EXAM: CT CHEST, ABDOMEN, AND PELVIS WITH CONTRAST TECHNIQUE: Multidetector CT imaging of the chest, abdomen and pelvis was performed following the standard protocol during bolus administration of intravenous contrast. CONTRAST:  161mL ISOVUE-300 IOPAMIDOL (ISOVUE-300) INJECTION 61% COMPARISON:  None. FINDINGS: CT CHEST FINDINGS Cardiovascular: The heart is normal in size. No pericardial effusion. No evidence of thoracic aortic aneurysm. Mild atherosclerotic calcifications of the aortic arch. Mediastinum/Nodes: No suspicious mediastinal, hilar, or axillary lymphadenopathy. Visualized thyroid is unremarkable. Lungs/Pleura: 7 mm subpleural nodule in the anterior right upper lobe (series 5/image 63). Lungs are otherwise clear. No pleural effusion or pneumothorax. Musculoskeletal: Suspected asymmetric glandular tissue along the lateral right breast (series 2/ image 27; series 6/ image 112). Degenerative changes of the thoracic spine. Moderate inferior endplate compression fracture deformity at T12 with mild retropulsion and associated sclerosis (sagittal image 129). CT ABDOMEN PELVIS FINDINGS Hepatobiliary: Liver is within normal limits. Status post cholecystectomy. Pancreas: Fatty parenchymal replacement. Spleen: Within normal limits. Adrenals/Urinary Tract: Adrenal glands are within normal limits. Kidneys are within normal limits.  No hydronephrosis. Bladder is underdistended but unremarkable. Stomach/Bowel: Stomach is within normal limits. No evidence of bowel obstruction. Normal appendix (series 2/image 97). Vascular/Lymphatic: No evidence of abdominal aortic aneurysm. Atherosclerotic calcifications of the abdominal aorta and branch vessels. No suspicious abdominopelvic lymphadenopathy. Reproductive: Uterus is within normal limits. Left ovary is within normal limits. 7.5 x 6.8 cm right ovarian mass with fat, fluid, and  calcifications, compatible with a benign ovarian dermoid (series 2/ image 100). Other: No abdominopelvic ascites. Peritoneal disease with omental caking beneath the left anterior abdominal wall, measuring approximately 2.2 x 17.6 cm (series 2/ image 84). Musculoskeletal: Degenerative changes of the lumbar spine. 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 Peritoneal disease with omental caking beneath the left anterior abdominal wall, as described above. 7 mm anterior right upper lobe pulmonary nodule, isolated pulmonary metastasis not excluded. Attention on follow-up is suggested. Suspected asymmetric glandular tissue along the lateral right breast. Correlate with mammography. Statistically, GYN and GI malignancies are the most common underlying etiology of peritoneal disease, although primary perineal malignancy or hematogenous spread of an extra-abdominal primary (breast, lung, melanoma) are also possible. In this patient, an  obvious GYN, GI, or lung primary is not visualized, noting that stomach/colon primaries can occasionally be CT-occult. Electronically Signed   By: Julian Hy M.D.   On: 04/18/2017 10:56   Ct Biopsy  Result Date: 04/22/2017 CLINICAL DATA:  Ovarian mass and evidence by CT of peritoneal carcinomatosis with anterior omental tumor. EXAM: CT GUIDED CORE BIOPSY OF PERITONEAL MASS ANESTHESIA/SEDATION: 2.5 mg IV Versed; 100 mcg IV Fentanyl Total Moderate Sedation Time:  23 minutes. The patient's level of consciousness and physiologic status were continuously monitored during the procedure by Radiology nursing. PROCEDURE: The procedure risks, benefits, and alternatives were explained to the patient. Questions regarding the procedure were encouraged and answered. The patient understands and consents to the procedure. A time-out was performed prior to initiating the procedure. The abdominal wall was prepped with chlorhexidine in a sterile fashion, and  a sterile drape was applied covering the operative field. A sterile gown and sterile gloves were used for the procedure. Local anesthesia was provided with 1% Lidocaine. CT was performed through the lower abdomen and pelvis in a supine position. Under CT guidance, a 17 gauge trocar needle was advanced to the level of left lower quadrant peritoneal tumor. Three separate coaxial 18 gauge core biopsy samples were obtained and submitted in formalin. A slurry of Gel-Foam pledgets was injected via the 17 gauge needle as it was retracted. COMPLICATIONS: None FINDINGS: Tumor in the anterior left peritoneal cavity just deep to the abdominal wall was targeted. Biopsy yielded solid tissue. IMPRESSION: CT-guided core biopsy performed of peritoneal tumor in the left lower quadrant. Electronically Signed   By: Aletta Edouard M.D.   On: 04/22/2017 10:25    ASSESSMENT & PLAN:  Ovarian cancer on right ([place]) Clinically, she has an amazing response with treatment She denies significant side effects such as nausea, vomiting, changes in bowel habits or peripheral neuropathy She had slight infusion reaction to Taxol but with additional premedications, she tolerated the rest of treatment well We will proceed with cycle 2 without dose adjustment I will tentatively schedule cycle 3 and 4 along with CT scan before cycle 4 I will also coordinate appointment so that she can meet with her GYN oncologist after cycle 3 to review results of CT scan with plan for possible interim debulking surgery if appropriate If surgery is not appropriate, we will proceed with cycle 4 as scheduled at the end of December  Anemia due to antineoplastic chemotherapy This is likely due to recent treatment. The patient denies recent history of bleeding such as epistaxis, hematuria or hematochezia. She is asymptomatic from the anemia. I will observe for now.  She does not require transfusion now. I will continue the chemotherapy at current dose without  dosage adjustment.  If the anemia gets progressive worse in the future, I might have to delay her treatment or adjust the chemotherapy dose.   Type 2 diabetes mellitus without complication (HCC) She has mildly elevated blood sugar secondary to steroids treatment Continue close monitoring   Orders Placed This Encounter  Procedures  . CT ABDOMEN PELVIS W CONTRAST    Standing Status:   Future    Standing Expiration Date:   05/15/2018    Scheduling Instructions:     Important to have scan just before 12/14, not sooner    Order Specific Question:   If indicated for the ordered procedure, I authorize the administration of contrast media per Radiology protocol    Answer:   Yes    Order Specific Question:  Preferred imaging location?    Answer:   [facility]    Order Specific Question:   Radiology Contrast Protocol - do NOT remove file path    Answer:   [email or web address]   All questions were answered. The patient knows to call the clinic with any problems, questions or concerns. No barriers to learning was detected. I spent 20 minutes counseling the patient face to face. The total time spent in the appointment was 25 minutes and more than 50% was on counseling and review of test results     Heath Lark, MD 05/15/2017 12:49 PM

## 2017-05-15 NOTE — Assessment & Plan Note (Addendum)
Clinically, she has an amazing response with treatment She denies significant side effects such as nausea, vomiting, changes in bowel habits or peripheral neuropathy She had slight infusion reaction to Taxol but with additional premedications, she tolerated the rest of treatment well We will proceed with cycle 2 without dose adjustment I will tentatively schedule cycle 3 and 4 along with CT scan before cycle 4 I will also coordinate appointment so that she can meet with her GYN oncologist after cycle 3 to review results of CT scan with plan for possible interim debulking surgery if appropriate If surgery is not appropriate, we will proceed with cycle 4 as scheduled at the end of December I plan to refer her to see genetic counselor after surgery

## 2017-05-15 NOTE — Assessment & Plan Note (Signed)
She has mildly elevated blood sugar secondary to steroids treatment Continue close monitoring

## 2017-05-15 NOTE — Telephone Encounter (Signed)
Gave avs and calendar for November and December  °

## 2017-05-15 NOTE — Patient Instructions (Signed)
[facility] Discharge Instructions for Patients Receiving Chemotherapy  Today you received the following chemotherapy agents Taxol and Carboplatin.   To help prevent nausea and vomiting after your treatment, we encourage you to take your nausea medication as directed.   If you develop nausea and vomiting that is not controlled by your nausea medication, call the clinic.   BELOW ARE SYMPTOMS THAT SHOULD BE REPORTED IMMEDIATELY:  *FEVER GREATER THAN 100.5 F  *CHILLS WITH OR WITHOUT FEVER  NAUSEA AND VOMITING THAT IS NOT CONTROLLED WITH YOUR NAUSEA MEDICATION  *UNUSUAL SHORTNESS OF BREATH  *UNUSUAL BRUISING OR BLEEDING  TENDERNESS IN MOUTH AND THROAT WITH OR WITHOUT PRESENCE OF ULCERS  *URINARY PROBLEMS  *BOWEL PROBLEMS  UNUSUAL RASH Items with * indicate a potential emergency and should be followed up as soon as possible.  Feel free to call the clinic should you have any questions or concerns. The clinic phone number is [phone].  Please show the CHEMO ALERT CARD at check-in to the Emergency Department and triage nurse.   Paclitaxel injection What is this medicine? PACLITAXEL (PAK li TAX el) is a chemotherapy drug. It targets fast dividing cells, like cancer cells, and causes these cells to die. This medicine is used to treat ovarian cancer, breast cancer, and other cancers. This medicine may be used for other purposes; ask your health care provider or pharmacist if you have questions. COMMON BRAND NAME(S): Onxol, Taxol What should I tell my health care provider before I take this medicine? They need to know if you have any of these conditions: -blood disorders -irregular heartbeat -infection (especially a virus infection such as chickenpox, cold sores, or herpes) -liver disease -previous or ongoing radiation therapy -an unusual or allergic reaction to paclitaxel, alcohol, polyoxyethylated castor oil, other chemotherapy agents, other medicines, foods,  dyes, or preservatives -pregnant or trying to get pregnant -breast-feeding How should I use this medicine? This drug is given as an infusion into a vein. It is administered in a hospital or clinic by a specially trained health care professional. Talk to your pediatrician regarding the use of this medicine in children. Special care may be needed. Overdosage: If you think you have taken too much of this medicine contact a poison control center or emergency room at once. NOTE: This medicine is only for you. Do not share this medicine with others. What if I miss a dose? It is important not to miss your dose. Call your doctor or health care professional if you are unable to keep an appointment. What may interact with this medicine? Do not take this medicine with any of the following medications: -disulfiram -metronidazole This medicine may also interact with the following medications: -cyclosporine -diazepam -ketoconazole -medicines to increase blood counts like filgrastim, pegfilgrastim, sargramostim -other chemotherapy drugs like cisplatin, doxorubicin, epirubicin, etoposide, teniposide, vincristine -quinidine -testosterone -vaccines -verapamil Talk to your doctor or health care professional before taking any of these medicines: -acetaminophen -aspirin -ibuprofen -ketoprofen -naproxen This list may not describe all possible interactions. Give your health care provider a list of all the medicines, herbs, non-prescription drugs, or dietary supplements you use. Also tell them if you smoke, drink alcohol, or use illegal drugs. Some items may interact with your medicine. What should I watch for while using this medicine? Your condition will be monitored carefully while you are receiving this medicine. You will need important blood work done while you are taking this medicine. This medicine can cause serious allergic reactions. To reduce your risk you   will need to take other medicine(s)  before treatment with this medicine. If you experience allergic reactions like skin rash, itching or hives, swelling of the face, lips, or tongue, tell your doctor or health care professional right away. In some cases, you may be given additional medicines to help with side effects. Follow all directions for their use. This drug may make you feel generally unwell. This is not uncommon, as chemotherapy can affect healthy cells as well as cancer cells. Report any side effects. Continue your course of treatment even though you feel ill unless your doctor tells you to stop. Call your doctor or health care professional for advice if you get a fever, chills or sore throat, or other symptoms of a cold or flu. Do not treat yourself. This drug decreases your body's ability to fight infections. Try to avoid being around people who are sick. This medicine may increase your risk to bruise or bleed. Call your doctor or health care professional if you notice any unusual bleeding. Be careful brushing and flossing your teeth or using a toothpick because you may get an infection or bleed more easily. If you have any dental work done, tell your dentist you are receiving this medicine. Avoid taking products that contain aspirin, acetaminophen, ibuprofen, naproxen, or ketoprofen unless instructed by your doctor. These medicines may hide a fever. Do not become pregnant while taking this medicine. Women should inform their doctor if they wish to become pregnant or think they might be pregnant. There is a potential for serious side effects to an unborn child. Talk to your health care professional or pharmacist for more information. Do not breast-feed an infant while taking this medicine. Men are advised not to father a child while receiving this medicine. This product may contain alcohol. Ask your pharmacist or healthcare provider if this medicine contains alcohol. Be sure to tell all healthcare providers you are taking this  medicine. Certain medicines, like metronidazole and disulfiram, can cause an unpleasant reaction when taken with alcohol. The reaction includes flushing, headache, nausea, vomiting, sweating, and increased thirst. The reaction can last from 30 minutes to several hours. What side effects may I notice from receiving this medicine? Side effects that you should report to your doctor or health care professional as soon as possible: -allergic reactions like skin rash, itching or hives, swelling of the face, lips, or tongue -low blood counts - This drug may decrease the number of white blood cells, red blood cells and platelets. You may be at increased risk for infections and bleeding. -signs of infection - fever or chills, cough, sore throat, pain or difficulty passing urine -signs of decreased platelets or bleeding - bruising, pinpoint red spots on the skin, black, tarry stools, nosebleeds -signs of decreased red blood cells - unusually weak or tired, fainting spells, lightheadedness -breathing problems -chest pain -high or low blood pressure -mouth sores -nausea and vomiting -pain, swelling, redness or irritation at the injection site -pain, tingling, numbness in the hands or feet -slow or irregular heartbeat -swelling of the ankle, feet, hands Side effects that usually do not require medical attention (report to your doctor or health care professional if they continue or are bothersome): -bone pain -complete hair loss including hair on your head, underarms, pubic hair, eyebrows, and eyelashes -changes in the color of fingernails -diarrhea -loosening of the fingernails -loss of appetite -muscle or joint pain -red flush to skin -sweating This list may not describe all possible side effects. Call your doctor for   medical advice about side effects. You may report side effects to FDA at 1-800-FDA-1088. Where should I keep my medicine? This drug is given in a hospital or clinic and will not be  stored at home. NOTE: This sheet is a summary. It may not cover all possible information. If you have questions about this medicine, talk to your doctor, pharmacist, or health care provider.  2018 [employer] Standard (2015-04-24 19:58:00)   Carboplatin injection What is this medicine? CARBOPLATIN (KAR boe pla tin) is a chemotherapy drug. It targets fast dividing cells, like cancer cells, and causes these cells to die. This medicine is used to treat ovarian cancer and many other cancers. This medicine may be used for other purposes; ask your health care provider or pharmacist if you have questions. COMMON BRAND NAME(S): Paraplatin What should I tell my health care provider before I take this medicine? They need to know if you have any of these conditions: -blood disorders -hearing problems -kidney disease -recent or ongoing radiation therapy -an unusual or allergic reaction to carboplatin, cisplatin, other chemotherapy, other medicines, foods, dyes, or preservatives -pregnant or trying to get pregnant -breast-feeding How should I use this medicine? This drug is usually given as an infusion into a vein. It is administered in a hospital or clinic by a specially trained health care professional. Talk to your pediatrician regarding the use of this medicine in children. Special care may be needed. Overdosage: If you think you have taken too much of this medicine contact a poison control center or emergency room at once. NOTE: This medicine is only for you. Do not share this medicine with others. What if I miss a dose? It is important not to miss a dose. Call your doctor or health care professional if you are unable to keep an appointment. What may interact with this medicine? -medicines for seizures -medicines to increase blood counts like filgrastim, pegfilgrastim, sargramostim -some antibiotics like amikacin, gentamicin, neomycin, streptomycin, tobramycin -vaccines Talk to your doctor  or health care professional before taking any of these medicines: -acetaminophen -aspirin -ibuprofen -ketoprofen -naproxen This list may not describe all possible interactions. Give your health care provider a list of all the medicines, herbs, non-prescription drugs, or dietary supplements you use. Also tell them if you smoke, drink alcohol, or use illegal drugs. Some items may interact with your medicine. What should I watch for while using this medicine? Your condition will be monitored carefully while you are receiving this medicine. You will need important blood work done while you are taking this medicine. This drug may make you feel generally unwell. This is not uncommon, as chemotherapy can affect healthy cells as well as cancer cells. Report any side effects. Continue your course of treatment even though you feel ill unless your doctor tells you to stop. In some cases, you may be given additional medicines to help with side effects. Follow all directions for their use. Call your doctor or health care professional for advice if you get a fever, chills or sore throat, or other symptoms of a cold or flu. Do not treat yourself. This drug decreases your body's ability to fight infections. Try to avoid being around people who are sick. This medicine may increase your risk to bruise or bleed. Call your doctor or health care professional if you notice any unusual bleeding. Be careful brushing and flossing your teeth or using a toothpick because you may get an infection or bleed more easily. If you have any dental work done,   tell your dentist you are receiving this medicine. Avoid taking products that contain aspirin, acetaminophen, ibuprofen, naproxen, or ketoprofen unless instructed by your doctor. These medicines may hide a fever. Do not become pregnant while taking this medicine. Women should inform their doctor if they wish to become pregnant or think they might be pregnant. There is a potential  for serious side effects to an unborn child. Talk to your health care professional or pharmacist for more information. Do not breast-feed an infant while taking this medicine. What side effects may I notice from receiving this medicine? Side effects that you should report to your doctor or health care professional as soon as possible: -allergic reactions like skin rash, itching or hives, swelling of the face, lips, or tongue -signs of infection - fever or chills, cough, sore throat, pain or difficulty passing urine -signs of decreased platelets or bleeding - bruising, pinpoint red spots on the skin, black, tarry stools, nosebleeds -signs of decreased red blood cells - unusually weak or tired, fainting spells, lightheadedness -breathing problems -changes in hearing -changes in vision -chest pain -high blood pressure -low blood counts - This drug may decrease the number of white blood cells, red blood cells and platelets. You may be at increased risk for infections and bleeding. -nausea and vomiting -pain, swelling, redness or irritation at the injection site -pain, tingling, numbness in the hands or feet -problems with balance, talking, walking -trouble passing urine or change in the amount of urine Side effects that usually do not require medical attention (report to your doctor or health care professional if they continue or are bothersome): -hair loss -loss of appetite -metallic taste in the mouth or changes in taste This list may not describe all possible side effects. Call your doctor for medical advice about side effects. You may report side effects to FDA at 1-800-FDA-1088. Where should I keep my medicine? This drug is given in a hospital or clinic and will not be stored at home. NOTE: This sheet is a summary. It may not cover all possible information. If you have questions about this medicine, talk to your doctor, pharmacist, or health care provider.  2018 [employer]/Gold Standard  (2007-09-28 14:38:05)  

## 2017-05-15 NOTE — Assessment & Plan Note (Signed)

## 2017-05-18 ENCOUNTER — Ambulatory Visit (HOSPITAL_BASED_OUTPATIENT_CLINIC_OR_DEPARTMENT_OTHER): Payer: Medicare Other

## 2017-05-18 VITALS — BP 139/78 | HR 100 | Temp 97.8°F | Resp 20

## 2017-05-18 DIAGNOSIS — Z5189 Encounter for other specified aftercare: Secondary | ICD-10-CM

## 2017-05-18 DIAGNOSIS — C561 Malignant neoplasm of right ovary: Secondary | ICD-10-CM | POA: Diagnosis present

## 2017-05-18 MED ORDER — PEGFILGRASTIM INJECTION 6 MG/0.6ML ~~LOC~~
6.0000 mg | PREFILLED_SYRINGE | Freq: Once | SUBCUTANEOUS | Status: AC
  Administered 2017-05-18: 6 mg via SUBCUTANEOUS
  Filled 2017-05-18: qty 0.6

## 2017-05-18 NOTE — Patient Instructions (Signed)
Pegfilgrastim injection What is this medicine? PEGFILGRASTIM (PEG fil gra stim) is a long-acting granulocyte colony-stimulating factor that stimulates the growth of neutrophils, a type of white blood cell important in the body's fight against infection. It is used to reduce the incidence of fever and infection in patients with certain types of cancer who are receiving chemotherapy that affects the bone marrow, and to increase survival after being exposed to high doses of radiation. This medicine may be used for other purposes; ask your health care provider or pharmacist if you have questions. COMMON BRAND NAME(S): Neulasta What should I tell my health care provider before I take this medicine? They need to know if you have any of these conditions: -kidney disease -latex allergy -ongoing radiation therapy -sickle cell disease -skin reactions to acrylic adhesives (On-Body Injector only) -an unusual or allergic reaction to pegfilgrastim, filgrastim, other medicines, foods, dyes, or preservatives -pregnant or trying to get pregnant -breast-feeding How should I use this medicine? This medicine is for injection under the skin. If you get this medicine at home, you will be taught how to prepare and give the pre-filled syringe or how to use the On-body Injector. Refer to the patient Instructions for Use for detailed instructions. Use exactly as directed. Tell your healthcare provider immediately if you suspect that the On-body Injector may not have performed as intended or if you suspect the use of the On-body Injector resulted in a missed or partial dose. It is important that you put your used needles and syringes in a special sharps container. Do not put them in a trash can. If you do not have a sharps container, call your pharmacist or healthcare provider to get one. Talk to your pediatrician regarding the use of this medicine in children. While this drug may be prescribed for selected conditions,  precautions do apply. Overdosage: If you think you have taken too much of this medicine contact a poison control center or emergency room at once. NOTE: This medicine is only for you. Do not share this medicine with others. What if I miss a dose? It is important not to miss your dose. Call your doctor or health care professional if you miss your dose. If you miss a dose due to an On-body Injector failure or leakage, a new dose should be administered as soon as possible using a single prefilled syringe for manual use. What may interact with this medicine? Interactions have not been studied. Give your health care provider a list of all the medicines, herbs, non-prescription drugs, or dietary supplements you use. Also tell them if you smoke, drink alcohol, or use illegal drugs. Some items may interact with your medicine. This list may not describe all possible interactions. Give your health care provider a list of all the medicines, herbs, non-prescription drugs, or dietary supplements you use. Also tell them if you smoke, drink alcohol, or use illegal drugs. Some items may interact with your medicine. What should I watch for while using this medicine? You may need blood work done while you are taking this medicine. If you are going to need a MRI, CT scan, or other procedure, tell your doctor that you are using this medicine (On-Body Injector only). What side effects may I notice from receiving this medicine? Side effects that you should report to your doctor or health care professional as soon as possible: -allergic reactions like skin rash, itching or hives, swelling of the face, lips, or tongue -dizziness -fever -pain, redness, or irritation at site   where injected -pinpoint red spots on the skin -red or dark-brown urine -shortness of breath or breathing problems -stomach or side pain, or pain at the shoulder -swelling -tiredness -trouble passing urine or change in the amount of urine Side  effects that usually do not require medical attention (report to your doctor or health care professional if they continue or are bothersome): -bone pain -muscle pain This list may not describe all possible side effects. Call your doctor for medical advice about side effects. You may report side effects to FDA at 1-800-FDA-1088. Where should I keep my medicine? Keep out of the reach of children. Store pre-filled syringes in a refrigerator between 2 and 8 degrees C (36 and 46 degrees F). Do not freeze. Keep in carton to protect from light. Throw away this medicine if it is left out of the refrigerator for more than 48 hours. Throw away any unused medicine after the expiration date. NOTE: This sheet is a summary. It may not cover all possible information. If you have questions about this medicine, talk to your doctor, pharmacist, or health care provider.  2018 Elsevier/Gold Standard (2016-06-19 12:58:03)  

## 2017-05-26 ENCOUNTER — Telehealth: Payer: Self-pay | Admitting: *Deleted

## 2017-05-26 NOTE — Telephone Encounter (Signed)
  Pt denies any fever or chills. Temperature os 97.9. Will take OTC mucinex for symptoms. Will call if any fever.

## 2017-05-26 NOTE — Telephone Encounter (Signed)
Pt left message stating she has a stuffy nose and scratchy throat.

## 2017-06-05 ENCOUNTER — Ambulatory Visit (HOSPITAL_BASED_OUTPATIENT_CLINIC_OR_DEPARTMENT_OTHER): Payer: Medicare Other

## 2017-06-05 ENCOUNTER — Other Ambulatory Visit (HOSPITAL_BASED_OUTPATIENT_CLINIC_OR_DEPARTMENT_OTHER): Payer: Medicare Other

## 2017-06-05 ENCOUNTER — Encounter: Payer: Self-pay | Admitting: Hematology and Oncology

## 2017-06-05 ENCOUNTER — Ambulatory Visit: Payer: Medicare Other

## 2017-06-05 ENCOUNTER — Ambulatory Visit (HOSPITAL_BASED_OUTPATIENT_CLINIC_OR_DEPARTMENT_OTHER): Payer: Medicare Other | Admitting: Hematology and Oncology

## 2017-06-05 DIAGNOSIS — C569 Malignant neoplasm of unspecified ovary: Secondary | ICD-10-CM

## 2017-06-05 DIAGNOSIS — T451X5A Adverse effect of antineoplastic and immunosuppressive drugs, initial encounter: Secondary | ICD-10-CM

## 2017-06-05 DIAGNOSIS — C561 Malignant neoplasm of right ovary: Secondary | ICD-10-CM

## 2017-06-05 DIAGNOSIS — E119 Type 2 diabetes mellitus without complications: Secondary | ICD-10-CM

## 2017-06-05 DIAGNOSIS — D6481 Anemia due to antineoplastic chemotherapy: Secondary | ICD-10-CM

## 2017-06-05 DIAGNOSIS — Z5111 Encounter for antineoplastic chemotherapy: Secondary | ICD-10-CM | POA: Diagnosis present

## 2017-06-05 LAB — CBC WITH DIFFERENTIAL/PLATELET
BASO%: 0.5 % (ref 0.0–2.0)
Basophils Absolute: 0 10*3/uL (ref 0.0–0.1)
EOS%: 0 % (ref 0.0–7.0)
Eosinophils Absolute: 0 10*3/uL (ref 0.0–0.5)
HCT: 33.4 % — ABNORMAL LOW (ref 34.8–46.6)
HGB: 10.9 g/dL — ABNORMAL LOW (ref 11.6–15.9)
LYMPH%: 5.7 % — AB (ref 14.0–49.7)
MCH: 29.9 pg (ref 25.1–34.0)
MCHC: 32.6 g/dL (ref 31.5–36.0)
MCV: 91.7 fL (ref 79.5–101.0)
MONO#: 0.1 10*3/uL (ref 0.1–0.9)
MONO%: 0.9 % (ref 0.0–14.0)
NEUT%: 92.9 % — AB (ref 38.4–76.8)
NEUTROS ABS: 6.4 10*3/uL (ref 1.5–6.5)
PLATELETS: 264 10*3/uL (ref 145–400)
RBC: 3.64 10*6/uL — AB (ref 3.70–5.45)
RDW: 18.7 % — ABNORMAL HIGH (ref 11.2–14.5)
WBC: 6.9 10*3/uL (ref 3.9–10.3)
lymph#: 0.4 10*3/uL — ABNORMAL LOW (ref 0.9–3.3)

## 2017-06-05 LAB — COMPREHENSIVE METABOLIC PANEL
ALT: 36 U/L (ref 0–55)
ANION GAP: 14 meq/L — AB (ref 3–11)
AST: 22 U/L (ref 5–34)
Albumin: 4 g/dL (ref 3.5–5.0)
Alkaline Phosphatase: 98 U/L (ref 40–150)
BUN: 15 mg/dL (ref 7.0–26.0)
CHLORIDE: 103 meq/L (ref 98–109)
CO2: 21 meq/L — AB (ref 22–29)
Calcium: 10.1 mg/dL (ref 8.4–10.4)
Creatinine: 0.9 mg/dL (ref 0.6–1.1)
GLUCOSE: 279 mg/dL — AB (ref 70–140)
Potassium: 4.1 mEq/L (ref 3.5–5.1)
SODIUM: 139 meq/L (ref 136–145)
Total Bilirubin: 0.44 mg/dL (ref 0.20–1.20)
Total Protein: 8.1 g/dL (ref 6.4–8.3)

## 2017-06-05 MED ORDER — PALONOSETRON HCL INJECTION 0.25 MG/5ML
INTRAVENOUS | Status: AC
Start: 2017-06-05 — End: 2017-06-05
  Filled 2017-06-05: qty 5

## 2017-06-05 MED ORDER — DIPHENHYDRAMINE HCL 50 MG/ML IJ SOLN
INTRAMUSCULAR | Status: AC
  Filled 2017-06-05: qty 1

## 2017-06-05 MED ORDER — SODIUM CHLORIDE 0.9 % IV SOLN
750.0000 mg | Freq: Once | INTRAVENOUS | Status: AC
  Administered 2017-06-05: 750 mg via INTRAVENOUS
  Filled 2017-06-05: qty 75

## 2017-06-05 MED ORDER — FAMOTIDINE IN NACL 20-0.9 MG/50ML-% IV SOLN
INTRAVENOUS | Status: AC
  Filled 2017-06-05: qty 50

## 2017-06-05 MED ORDER — SODIUM CHLORIDE 0.9 % IV SOLN
Freq: Once | INTRAVENOUS | Status: AC
  Administered 2017-06-05: 10:00:00 via INTRAVENOUS

## 2017-06-05 MED ORDER — SODIUM CHLORIDE 0.9 % IV SOLN
175.0000 mg/m2 | Freq: Once | INTRAVENOUS | Status: AC
  Administered 2017-06-05: 402 mg via INTRAVENOUS
  Filled 2017-06-05: qty 67

## 2017-06-05 MED ORDER — SODIUM CHLORIDE 0.9% FLUSH
10.0000 mL | Freq: Once | INTRAVENOUS | Status: DC
  Filled 2017-06-05: qty 10

## 2017-06-05 MED ORDER — SODIUM CHLORIDE 0.9 % IV SOLN
Freq: Once | INTRAVENOUS | Status: AC
  Administered 2017-06-05: 10:00:00 via INTRAVENOUS
  Filled 2017-06-05: qty 5

## 2017-06-05 MED ORDER — FAMOTIDINE IN NACL 20-0.9 MG/50ML-% IV SOLN
20.0000 mg | Freq: Once | INTRAVENOUS | Status: AC
  Administered 2017-06-05: 20 mg via INTRAVENOUS

## 2017-06-05 MED ORDER — PALONOSETRON HCL INJECTION 0.25 MG/5ML
0.2500 mg | Freq: Once | INTRAVENOUS | Status: AC
  Administered 2017-06-05: 0.25 mg via INTRAVENOUS

## 2017-06-05 MED ORDER — DIPHENHYDRAMINE HCL 50 MG/ML IJ SOLN
50.0000 mg | Freq: Once | INTRAMUSCULAR | Status: AC
  Administered 2017-06-05: 50 mg via INTRAVENOUS

## 2017-06-05 NOTE — Patient Instructions (Signed)
[facility] Discharge Instructions for Patients Receiving Chemotherapy  Today you received the following chemotherapy agents:  Taxol, Carbo  To help prevent nausea and vomiting after your treatment, we encourage you to take your nausea medication as prescribed.   If you develop nausea and vomiting that is not controlled by your nausea medication, call the clinic.   BELOW ARE SYMPTOMS THAT SHOULD BE REPORTED IMMEDIATELY:  *FEVER GREATER THAN 100.5 F  *CHILLS WITH OR WITHOUT FEVER  NAUSEA AND VOMITING THAT IS NOT CONTROLLED WITH YOUR NAUSEA MEDICATION  *UNUSUAL SHORTNESS OF BREATH  *UNUSUAL BRUISING OR BLEEDING  TENDERNESS IN MOUTH AND THROAT WITH OR WITHOUT PRESENCE OF ULCERS  *URINARY PROBLEMS  *BOWEL PROBLEMS  UNUSUAL RASH Items with * indicate a potential emergency and should be followed up as soon as possible.  Feel free to call the clinic should you have any questions or concerns. The clinic phone number is [phone].  Please show the CHEMO ALERT CARD at check-in to the Emergency Department and triage nurse.   

## 2017-06-05 NOTE — Assessment & Plan Note (Signed)
Clinically, she has an amazing response with treatment with clinical improvement of well being She denies significant side effects such as nausea, vomiting, changes in bowel habits or peripheral neuropathy She had slight infusion reaction to Taxol but with additional premedications, she tolerated the rest of treatment well We will proceed with cycle 4 without dose adjustment She has appointment scheduled to meet with her GYN oncologist after cycle 3 to review results of CT scan with plan for possible interim debulking surgery if appropriate If surgery is not appropriate, we will proceed with cycle 4 as scheduled at the end of December I plan to refer her to see genetic counselor after surgery

## 2017-06-05 NOTE — Progress Notes (Signed)
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       03/20/2017 Imaging    GYN ultrasound was performed this revealed a small fundus measuring 5 cm there are 2 small fibroids within the fundus with the largest measuring 1.3 cm Within the endometrium there is a small amount a fluid the endometrial wall was slightly thickened at 4.8 mm the ultrasound was consistent with a small endometrial polyp  The right adnexa measured 7.4 x 6.7 cm contained a both a cystic and solid mass with a small amount of shadowing This is consistent with a dermoid tumor the left ovary was 2.6 x 1.5 x 1.0 cm There is no other adnexal masses no free fluid within the abdomen no ascites          04/10/2017 Surgery    Operation: Operative Laparscopy with  Peritoneal biopsy 2 hysteroscopy D&C Surgeon: Lynder Parents MD  Specimens: Peritoneal biopsy and cellular washings  Complications: None FIndings Small amount of ascites was found cellular washings were sent There was peritoneal studding And omental caking Even in steep Trendelenburg the ovaries and uterus were unable to be visualized due to the small intestines in the lower pelvis          04/10/2017 Pathology Results    Biopsy from [facility] was nondiagnostic.  Peritoneum biopsy did not reveal malignancy.  Endometrial biopsy also did not reveal malignancy.      04/13/2017 Tumor Marker    Patient's tumor was tested for the following markers: CA-125 Results of the tumor marker test revealed 324.1      04/20/2017 Tumor Marker    Patient's tumor was  tested for the following markers: CA-125 Results of the tumor marker test revealed 345.6      04/22/2017 Pathology Results    Peritoneum, biopsy - CARCINOMA. - SEE COMMENT.      04/22/2017 Procedure    CT-guided core biopsy performed of peritoneal tumor in the left lower quadrant.      04/24/2017 -  Chemotherapy    She received carboplatin and Taxol       INTERVAL HISTORY: Please see below for problem oriented charting. She returns for cycle 3 of chemotherapy She tolerated last cycle well days nausea, vomiting, peripheral neuropathy or changes in bowel habits Overall, she feels well without major side effects.  REVIEW OF SYSTEMS:   Constitutional: Denies fevers, chills or abnormal weight loss Eyes: Denies blurriness of vision Ears, nose, mouth, throat, and face: Denies mucositis or sore throat Respiratory: Denies cough, dyspnea or wheezes Cardiovascular: Denies palpitation, chest discomfort or lower extremity swelling Gastrointestinal:  Denies nausea, heartburn or change in bowel habits Skin: Denies abnormal skin rashes Lymphatics: Denies new lymphadenopathy or easy bruising Neurological:Denies numbness, tingling or new weaknesses Behavioral/Psych: Mood is stable, no new changes  All other systems were reviewed with the patient and are negative.  I have reviewed the past medical history, past surgical history, social history and family history with the patient and they are unchanged from previous note.  ALLERGIES:  is allergic to aspirin; nsaids; and tramadol.  MEDICATIONS:  Current Outpatient Medications  Medication Sig Dispense Refill  . acetaminophen (TYLENOL) 650 MG CR tablet Take 1,300 mg by mouth every 8 (eight) hours.     Marland Kitchen alendronate (FOSAMAX) 70 MG tablet Take 70 mg by mouth every Sunday.     Marland Kitchen atorvastatin (LIPITOR) 80 MG tablet Take 40 mg by mouth at bedtime.     . Cholecalciferol (VITAMIN D3) 2000 units TABS Take 2,000 Units by mouth daily.    .  citalopram (CELEXA) 10 MG tablet Take 10 mg by mouth at bedtime    . dexamethasone (DECADRON) 4 MG tablet Take 3 tabs the night before chemo and 3 tabs in the morning of chemo with food, every 21 days (Patient taking differently: Take 12 mg by mouth See admin instructions. Take 12 mg by mouth the night before chemo and 12 mg in the morning of chemo with food, every 21 days) 36 tablet 1  . gabapentin (NEURONTIN) 300 MG capsule Take 300 mg by mouth every 8 (eight) hours.     . Glucosamine Sulfate 500 MG TABS Take 500 mg by mouth 2 (two) times daily.     Marland Kitchen linagliptin (TRADJENTA) 5 MG TABS tablet Take 5 mg by mouth daily.     Marland Kitchen loratadine (CLARITIN) 10 MG tablet Take 10 mg by mouth at bedtime.     Marland Kitchen losartan (COZAAR) 25 MG tablet Take 25 mg by mouth daily.     . meclizine (ANTIVERT) 25 MG tablet Take 25 mg by mouth 2 (two) times daily as needed for dizziness.     . metFORMIN (GLUCOPHAGE) 1000 MG tablet Take 1,000 mg by mouth 2 (two) times daily with a meal.     . omeprazole (PRILOSEC) 40 MG capsule Take 40 mg by mouth daily.     . ondansetron (ZOFRAN) 8 MG tablet Start on day 3 after chemo. (Patient taking differently: Take 8 mg by mouth See admin instructions. Take 8 mg by mouth as needed for nausea and vomiting, start on day 3 after chemo.) 30 tablet 1  . pioglitazone (ACTOS) 15 MG tablet Take 15 mg by mouth at bedtime.     . prochlorperazine (COMPAZINE) 10 MG tablet Take 1 tablet (10 mg total) by mouth every 6 (six) hours as needed (Nausea or vomiting). 30 tablet 1  . vitamin B-12 (CYANOCOBALAMIN) 1000 MCG tablet Take 1,000 mcg by mouth daily.      No current facility-administered medications for this visit.    Facility-Administered Medications Ordered in Other Visits  Medication Dose Route Frequency Provider Last Rate Last Dose  . CARBOplatin (PARAPLATIN) 750 mg in sodium chloride 0.9 % 250 mL chemo infusion  750 mg Intravenous Once Heath Lark, MD 650 mL/hr at 06/05/17 1447 750 mg at 06/05/17  1447    PHYSICAL EXAMINATION: ECOG PERFORMANCE STATUS: 1 - Symptomatic but completely ambulatory  Vitals:   06/05/17 0816  BP: (!) 143/90  Pulse: (!) 113  Resp: 18  Temp: 98.1 F (36.7 C)  SpO2: 98%   Filed Weights   06/05/17 0816  Weight: 236 lb 1.6 oz (107.1 kg)    GENERAL:alert, no distress and comfortable.  She is morbidly obese SKIN: skin color, texture, turgor are normal, no rashes or significant lesions EYES: normal, Conjunctiva are pink and non-injected, sclera clear OROPHARYNX:no exudate, no erythema and lips, buccal mucosa, and tongue normal  NECK: supple, thyroid normal size, non-tender, without nodularity LYMPH:  no palpable lymphadenopathy in the cervical, axillary or inguinal LUNGS: clear to  auscultation and percussion with normal breathing effort HEART: regular rate & rhythm and no murmurs and no lower extremity edema ABDOMEN:abdomen soft, non-tender and normal bowel sounds Musculoskeletal:no cyanosis of digits and no clubbing  NEURO: alert & oriented x 3 with fluent speech, no focal motor/sensory deficits  LABORATORY DATA:  I have reviewed the data as listed    Component Value Date/Time   NA 139 06/05/2017 0746   K 4.1 06/05/2017 0746   CO2 21 (L) 06/05/2017 0746   GLUCOSE 279 (H) 06/05/2017 0746   BUN 15.0 06/05/2017 0746   CREATININE 0.9 06/05/2017 0746   CALCIUM 10.1 06/05/2017 0746   PROT 8.1 06/05/2017 0746   ALBUMIN 4.0 06/05/2017 0746   AST 22 06/05/2017 0746   ALT 36 06/05/2017 0746   ALKPHOS 98 06/05/2017 0746   BILITOT 0.44 06/05/2017 0746    No results found for: SPEP, UPEP  Lab Results  Component Value Date   WBC 6.9 06/05/2017   NEUTROABS 6.4 06/05/2017   HGB 10.9 (L) 06/05/2017   HCT 33.4 (L) 06/05/2017   MCV 91.7 06/05/2017   PLT 264 06/05/2017      Chemistry      Component Value Date/Time   NA 139 06/05/2017 0746   K 4.1 06/05/2017 0746   CO2 21 (L) 06/05/2017 0746   BUN 15.0 06/05/2017 0746   CREATININE 0.9  06/05/2017 0746      Component Value Date/Time   CALCIUM 10.1 06/05/2017 0746   ALKPHOS 98 06/05/2017 0746   AST 22 06/05/2017 0746   ALT 36 06/05/2017 0746   BILITOT 0.44 06/05/2017 0746      ASSESSMENT & PLAN:  Ovarian cancer on right ([place]) Clinically, she has an amazing response with treatment with clinical improvement of well being She denies significant side effects such as nausea, vomiting, changes in bowel habits or peripheral neuropathy She had slight infusion reaction to Taxol but with additional premedications, she tolerated the rest of treatment well We will proceed with cycle 4 without dose adjustment She has appointment scheduled to meet with her GYN oncologist after cycle 3 to review results of CT scan with plan for possible interim debulking surgery if appropriate If surgery is not appropriate, we will proceed with cycle 4 as scheduled at the end of December I plan to refer her to see genetic counselor after surgery  Anemia due to antineoplastic chemotherapy This is likely due to recent treatment. The patient denies recent history of bleeding such as epistaxis, hematuria or hematochezia. She is asymptomatic from the anemia. I will observe for now.  She does not require transfusion now. I will continue the chemotherapy at current dose without dosage adjustment.  If the anemia gets progressive worse in the future, I might have to delay her treatment or adjust the chemotherapy dose.   Type 2 diabetes mellitus without complication (HCC) She has mildly elevated blood sugar secondary to steroids treatment Continue close monitoring   No orders of the defined types were placed in this encounter.  All questions were answered. The patient knows to call the clinic with any problems, questions or concerns. No barriers to learning was detected. I spent 15 minutes counseling the patient face to face. The total time spent in the appointment was 20 minutes and more than 50% was on  counseling and review of test results     Heath Lark, MD 06/05/2017 3:15 PM

## 2017-06-05 NOTE — Assessment & Plan Note (Signed)

## 2017-06-05 NOTE — Assessment & Plan Note (Signed)
She has mildly elevated blood sugar secondary to steroids treatment Continue close monitoring

## 2017-06-06 ENCOUNTER — Telehealth: Payer: Self-pay

## 2017-06-06 LAB — CA 125: Cancer Antigen (CA) 125: 27.5 U/mL (ref 0.0–38.1)

## 2017-06-06 NOTE — Telephone Encounter (Signed)
"  NO RETURN VISIT OR RETURN ORDERS". Per 11/30 los

## 2017-06-08 ENCOUNTER — Telehealth: Payer: Self-pay

## 2017-06-08 ENCOUNTER — Encounter: Payer: Self-pay | Admitting: Hematology and Oncology

## 2017-06-08 ENCOUNTER — Ambulatory Visit (HOSPITAL_BASED_OUTPATIENT_CLINIC_OR_DEPARTMENT_OTHER): Payer: Medicare Other

## 2017-06-08 VITALS — BP 144/75 | HR 112 | Temp 98.1°F | Resp 18

## 2017-06-08 DIAGNOSIS — Z5189 Encounter for other specified aftercare: Secondary | ICD-10-CM

## 2017-06-08 DIAGNOSIS — C561 Malignant neoplasm of right ovary: Secondary | ICD-10-CM

## 2017-06-08 MED ORDER — PEGFILGRASTIM INJECTION 6 MG/0.6ML ~~LOC~~
6.0000 mg | PREFILLED_SYRINGE | Freq: Once | SUBCUTANEOUS | Status: AC
  Administered 2017-06-08: 6 mg via SUBCUTANEOUS
  Filled 2017-06-08: qty 0.6

## 2017-06-08 NOTE — Progress Notes (Signed)
Patient needed itemized statements for [facility] emailed to [facility] representative.  Printed EOBs for patient to show before emailing. Patient came in and states that was fine to send. Advised the bill has not generated due to pending in insurance and she may contact billing directly to receive a copy of this.  Patient has my card for any additional financial questions or concerns. Gave patient original [facility] paper left for me by RN back to her as well as a copy of the EOBs.

## 2017-06-08 NOTE — Patient Instructions (Signed)
Pegfilgrastim injection What is this medicine? PEGFILGRASTIM (PEG fil gra stim) is a long-acting granulocyte colony-stimulating factor that stimulates the growth of neutrophils, a type of white blood cell important in the body's fight against infection. It is used to reduce the incidence of fever and infection in patients with certain types of cancer who are receiving chemotherapy that affects the bone marrow, and to increase survival after being exposed to high doses of radiation. This medicine may be used for other purposes; ask your health care provider or pharmacist if you have questions. COMMON BRAND NAME(S): Neulasta What should I tell my health care provider before I take this medicine? They need to know if you have any of these conditions: -kidney disease -latex allergy -ongoing radiation therapy -sickle cell disease -skin reactions to acrylic adhesives (On-Body Injector only) -an unusual or allergic reaction to pegfilgrastim, filgrastim, other medicines, foods, dyes, or preservatives -pregnant or trying to get pregnant -breast-feeding How should I use this medicine? This medicine is for injection under the skin. If you get this medicine at home, you will be taught how to prepare and give the pre-filled syringe or how to use the On-body Injector. Refer to the patient Instructions for Use for detailed instructions. Use exactly as directed. Tell your healthcare provider immediately if you suspect that the On-body Injector may not have performed as intended or if you suspect the use of the On-body Injector resulted in a missed or partial dose. It is important that you put your used needles and syringes in a special sharps container. Do not put them in a trash can. If you do not have a sharps container, call your pharmacist or healthcare provider to get one. Talk to your pediatrician regarding the use of this medicine in children. While this drug may be prescribed for selected conditions,  precautions do apply. Overdosage: If you think you have taken too much of this medicine contact a poison control center or emergency room at once. NOTE: This medicine is only for you. Do not share this medicine with others. What if I miss a dose? It is important not to miss your dose. Call your doctor or health care professional if you miss your dose. If you miss a dose due to an On-body Injector failure or leakage, a new dose should be administered as soon as possible using a single prefilled syringe for manual use. What may interact with this medicine? Interactions have not been studied. Give your health care provider a list of all the medicines, herbs, non-prescription drugs, or dietary supplements you use. Also tell them if you smoke, drink alcohol, or use illegal drugs. Some items may interact with your medicine. This list may not describe all possible interactions. Give your health care provider a list of all the medicines, herbs, non-prescription drugs, or dietary supplements you use. Also tell them if you smoke, drink alcohol, or use illegal drugs. Some items may interact with your medicine. What should I watch for while using this medicine? You may need blood work done while you are taking this medicine. If you are going to need a MRI, CT scan, or other procedure, tell your doctor that you are using this medicine (On-Body Injector only). What side effects may I notice from receiving this medicine? Side effects that you should report to your doctor or health care professional as soon as possible: -allergic reactions like skin rash, itching or hives, swelling of the face, lips, or tongue -dizziness -fever -pain, redness, or irritation at site   where injected -pinpoint red spots on the skin -red or dark-brown urine -shortness of breath or breathing problems -stomach or side pain, or pain at the shoulder -swelling -tiredness -trouble passing urine or change in the amount of urine Side  effects that usually do not require medical attention (report to your doctor or health care professional if they continue or are bothersome): -bone pain -muscle pain This list may not describe all possible side effects. Call your doctor for medical advice about side effects. You may report side effects to FDA at 1-800-FDA-1088. Where should I keep my medicine? Keep out of the reach of children. Store pre-filled syringes in a refrigerator between 2 and 8 degrees C (36 and 46 degrees F). Do not freeze. Keep in carton to protect from light. Throw away this medicine if it is left out of the refrigerator for more than 48 hours. Throw away any unused medicine after the expiration date. NOTE: This sheet is a summary. It may not cover all possible information. If you have questions about this medicine, talk to your doctor, pharmacist, or health care provider.  2018 Elsevier/Gold Standard (2016-06-19 12:58:03)  

## 2017-06-08 NOTE — Telephone Encounter (Signed)
Called with below message. 

## 2017-06-08 NOTE — Telephone Encounter (Signed)
-----   Message from Heath Lark, MD sent at 06/08/2017  8:51 AM EST ----- Regarding: tumor marker Let her know tumor marker is now normal ----- Message ----- From: Interface, Lab In Three Zero One Sent: 06/05/2017   8:09 AM To: Heath Lark, MD

## 2017-06-17 ENCOUNTER — Ambulatory Visit (HOSPITAL_COMMUNITY)
Admission: RE | Admit: 2017-06-17 | Discharge: 2017-06-17 | Disposition: A | Discharge status: Home / Self Care | Payer: Medicare Other | Source: Ambulatory Visit | Attending: Hematology and Oncology | Admitting: Hematology and Oncology

## 2017-06-17 ENCOUNTER — Encounter (HOSPITAL_COMMUNITY): Payer: Self-pay

## 2017-06-17 ENCOUNTER — Telehealth: Payer: Self-pay | Admitting: *Deleted

## 2017-06-17 DIAGNOSIS — I517 Cardiomegaly: Secondary | ICD-10-CM | POA: Diagnosis not present

## 2017-06-17 DIAGNOSIS — M47896 Other spondylosis, lumbar region: Secondary | ICD-10-CM | POA: Insufficient documentation

## 2017-06-17 DIAGNOSIS — M4186 Other forms of scoliosis, lumbar region: Secondary | ICD-10-CM | POA: Insufficient documentation

## 2017-06-17 DIAGNOSIS — M4856XA Collapsed vertebra, not elsewhere classified, lumbar region, initial encounter for fracture: Secondary | ICD-10-CM | POA: Insufficient documentation

## 2017-06-17 DIAGNOSIS — C561 Malignant neoplasm of right ovary: Secondary | ICD-10-CM | POA: Insufficient documentation

## 2017-06-17 DIAGNOSIS — M5136 Other intervertebral disc degeneration, lumbar region: Secondary | ICD-10-CM | POA: Insufficient documentation

## 2017-06-17 DIAGNOSIS — I700 Atherosclerosis of aorta: Secondary | ICD-10-CM | POA: Insufficient documentation

## 2017-06-17 MED ORDER — IOPAMIDOL (ISOVUE-300) INJECTION 61%
100.0000 mL | Freq: Once | INTRAVENOUS | Status: AC | PRN
  Administered 2017-06-17: 100 mL via INTRAVENOUS

## 2017-06-17 MED ORDER — IOPAMIDOL (ISOVUE-300) INJECTION 61%
INTRAVENOUS | Status: AC
Start: 2017-06-17 — End: 2017-06-17
  Filled 2017-06-17: qty 100

## 2017-06-17 NOTE — Telephone Encounter (Signed)
Notified of message below. States she has had compression fracture since April 4th. Was a work related injury- has settled claim. Ortho MD did not recommend surgery.

## 2017-06-17 NOTE — Telephone Encounter (Signed)
-----   Message from Heath Lark, MD sent at 06/17/2017 10:59 AM EST ----- Regarding: Ct result Hi Cheryl Melton,  I am not seeing her until next week. Let her know CT looks good. Incidental findings of bad compression fracture on her back. Is she OK? If not in much pain, I would not recommend doing anything to her back until after surgery. If in a lot of pain, I might need to order MRI lumbar spine for evaluation/refer to neurosurgery  Thanks for call her

## 2017-06-19 ENCOUNTER — Ambulatory Visit: Payer: Medicare Other | Attending: Gynecology | Admitting: Gynecology

## 2017-06-19 ENCOUNTER — Encounter: Payer: Self-pay | Admitting: Gynecology

## 2017-06-19 VITALS — BP 157/83 | HR 96 | Temp 97.9°F | Resp 19 | Ht 67.0 in | Wt 242.3 lb

## 2017-06-19 DIAGNOSIS — Z7984 Long term (current) use of oral hypoglycemic drugs: Secondary | ICD-10-CM | POA: Diagnosis not present

## 2017-06-19 DIAGNOSIS — F419 Anxiety disorder, unspecified: Secondary | ICD-10-CM | POA: Diagnosis not present

## 2017-06-19 DIAGNOSIS — Z9221 Personal history of antineoplastic chemotherapy: Secondary | ICD-10-CM | POA: Insufficient documentation

## 2017-06-19 DIAGNOSIS — C786 Secondary malignant neoplasm of retroperitoneum and peritoneum: Secondary | ICD-10-CM | POA: Insufficient documentation

## 2017-06-19 DIAGNOSIS — Z79899 Other long term (current) drug therapy: Secondary | ICD-10-CM | POA: Insufficient documentation

## 2017-06-19 DIAGNOSIS — K219 Gastro-esophageal reflux disease without esophagitis: Secondary | ICD-10-CM | POA: Diagnosis not present

## 2017-06-19 DIAGNOSIS — Z8042 Family history of malignant neoplasm of prostate: Secondary | ICD-10-CM | POA: Insufficient documentation

## 2017-06-19 DIAGNOSIS — Z886 Allergy status to analgesic agent status: Secondary | ICD-10-CM | POA: Insufficient documentation

## 2017-06-19 DIAGNOSIS — E119 Type 2 diabetes mellitus without complications: Secondary | ICD-10-CM | POA: Insufficient documentation

## 2017-06-19 DIAGNOSIS — C569 Malignant neoplasm of unspecified ovary: Secondary | ICD-10-CM

## 2017-06-19 DIAGNOSIS — Z888 Allergy status to other drugs, medicaments and biological substances status: Secondary | ICD-10-CM | POA: Insufficient documentation

## 2017-06-19 DIAGNOSIS — Z9049 Acquired absence of other specified parts of digestive tract: Secondary | ICD-10-CM | POA: Diagnosis not present

## 2017-06-19 DIAGNOSIS — Z800 Family history of malignant neoplasm of digestive organs: Secondary | ICD-10-CM | POA: Diagnosis not present

## 2017-06-19 DIAGNOSIS — Z7983 Long term (current) use of bisphosphonates: Secondary | ICD-10-CM | POA: Insufficient documentation

## 2017-06-19 DIAGNOSIS — M180 Bilateral primary osteoarthritis of first carpometacarpal joints: Secondary | ICD-10-CM | POA: Insufficient documentation

## 2017-06-19 NOTE — Patient Instructions (Signed)
Plan on surgery at [facility] with Dr. Fermin Schwab on July 06, 2017.  Your pre-op appointment will be at [facility] on June 25, 2017 at 11:15am.

## 2017-06-19 NOTE — Progress Notes (Signed)
Consult Note: Gyn-Onc   Cheryl Melton 66 y.o. female  Chief Complaint  Patient presents with  . Malignant neoplasm of ovary, unspecified laterality (HCC)    Assessment : Apparent stage III C ovarian carcinoma status post 3 cycles of carboplatin and Taxol with an excellent response.  CT shows significant improvement the patient's omental metastases and Ca1 25 has fallen dramatically from 345 units/mL to 27.5 units/mL.  Her last cycle of chemotherapy was June 05, 2017.  Plan: As planned, the patient had an excellent response to chemotherapy and is now in a good position to undergo interval debulking.  She would like to proceed with interval debulking on July 06, 2017.  She will come to [facility] for preoperative evaluation.  The risks of surgery including hemorrhage, infection, injury to adjacent viscera, and anesthetic risks were discussed with the patient and her family.  Questions were answered, they wish to proceed with surgery as planned.  HPI: 66 year old white e there married female gravida  seen in consultation request of Dr.[name] regarding management of a newly diagnosed advanced ovarian cancer. 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 There is no free fluid or other adnexal masses noted. She underwent laparoscopy on 04/10/2017 with the surprise finding of peritoneal studding, omental cake, and a small amount of ascites. Biopsy of peritoneal implants was obtained but the result is pending at the time of this dictation. No tumor markers have been obtained.  Retrospectively the patient denies any particular symptoms that might been associated with ovarian cancer. She has no family history of ovarian or breast cancer. Her mother had colon cancer. She has no other gynecologic  history although she has primary infertility.  Patient was treated with neoadjuvant chemotherapy receiving 3 cycles of carboplatin and Taxol the last being June 05, 2017.  The Ca1 25 fell from 345 units/mL to 27.5 units/mL after 3 cycles.  CT scan after 3 cycles showed significant improvement in her omental cake and a persistent right ovarian mass that has fatty and calcified elements consistent with a dermoid.  She also has a compression fracture at L1.    Review of Systems:10 point review of systems is negative except as noted in interval history.   Vitals: Blood pressure (!) 157/83, pulse 96, temperature 97.9 F (36.6 C), temperature source Oral, resp. rate 19, height 5\' 7"  (1.702 m), weight 242 lb 4.8 oz (109.9 kg), SpO2 97 %.  Physical Exam: General : The patient is a healthy woman in no acute distress.  HEENT: normocephalic, extraoccular movements normal; neck is supple without thyromegally  Lynphnodes: Supraclavicular and inguinal nodes not enlarged  Abdomen: obese, Soft, non-tender, no ascites, no organomegally, no masses, no hernias  umbilical incision Pelvic:  EGBUS: Normal female  Vagina: Normal, no lesions  Urethra and Bladder: Normal, non-tender  Cervix:  small normal Uterus:  difficult to outline secondary to the patient's habitus. Bi-manual examination: Non-tender; no adenxal masses or nodularity  Rectal: normal sphincter tone, no masses, no blood  Lower extremities: No edema or varicosities. Normal range of motion      Allergies  Allergen Reactions  . Aspirin Other (See Comments)    NSAIDS( advised not to take per hematology)  . Nsaids Other (See Comments)    Gastritis  . Tramadol Other (See Comments)  Headache, vomiting and disorientation     Past Medical History:  Diagnosis Date  . Anxiety   . Cancer ([place])   . Diabetes mellitus without complication ([place])   . GERD (gastroesophageal reflux disease)   . Osteoporosis   . Parathyroid adenoma     Past  Surgical History:  Procedure Laterality Date  . CHOLECYSTECTOMY    . COLONOSCOPY WITH ESOPHAGOGASTRODUODENOSCOPY (EGD)    . DILATION AND CURETTAGE OF UTERUS    . ELBOW SURGERY    . PARATHYROIDECTOMY  10/21/2016   One side done pt not sure which side.   Marland Kitchen ROTATOR CUFF REPAIR Right   . TONSILLECTOMY      Current Outpatient Medications  Medication Sig Dispense Refill  . acetaminophen (TYLENOL) 650 MG CR tablet Take 1,300 mg by mouth every 8 (eight) hours.     Marland Kitchen alendronate (FOSAMAX) 70 MG tablet Take 70 mg by mouth every Sunday.     Marland Kitchen atorvastatin (LIPITOR) 80 MG tablet Take 40 mg by mouth at bedtime.     . Cholecalciferol (VITAMIN D3) 2000 units TABS Take 2,000 Units by mouth daily.    . citalopram (CELEXA) 10 MG tablet Take 10 mg by mouth at bedtime    . dexamethasone (DECADRON) 4 MG tablet Take 3 tabs the night before chemo and 3 tabs in the morning of chemo with food, every 21 days (Patient taking differently: Take 12 mg by mouth See admin instructions. Take 12 mg by mouth the night before chemo and 12 mg in the morning of chemo with food, every 21 days) 36 tablet 1  . gabapentin (NEURONTIN) 300 MG capsule Take 300 mg by mouth every 8 (eight) hours.     . Glucosamine Sulfate 500 MG TABS Take 500 mg by mouth 2 (two) times daily.     Marland Kitchen linagliptin (TRADJENTA) 5 MG TABS tablet Take 5 mg by mouth daily.     Marland Kitchen loratadine (CLARITIN) 10 MG tablet Take 10 mg by mouth at bedtime.     Marland Kitchen losartan (COZAAR) 25 MG tablet Take 25 mg by mouth daily.     . meclizine (ANTIVERT) 25 MG tablet Take 25 mg by mouth 2 (two) times daily as needed for dizziness.     . metFORMIN (GLUCOPHAGE) 1000 MG tablet Take 1,000 mg by mouth 2 (two) times daily with a meal.     . omeprazole (PRILOSEC) 40 MG capsule Take 40 mg by mouth daily.     . ondansetron (ZOFRAN) 8 MG tablet Start on day 3 after chemo. (Patient taking differently: Take 8 mg by mouth See admin instructions. Take 8 mg by mouth as needed for nausea and  vomiting, start on day 3 after chemo.) 30 tablet 1  . pioglitazone (ACTOS) 15 MG tablet Take 15 mg by mouth at bedtime.     . prochlorperazine (COMPAZINE) 10 MG tablet Take 1 tablet (10 mg total) by mouth every 6 (six) hours as needed (Nausea or vomiting). 30 tablet 1  . vitamin B-12 (CYANOCOBALAMIN) 1000 MCG tablet Take 1,000 mcg by mouth daily.      No current facility-administered medications for this visit.     Social History   Socioeconomic History  . Marital status: Married    Spouse name: Mikeal Hawthorne  . Number of children: 0  . Years of education: Not on file  . Highest education level: Not on file  Social Needs  . Financial resource strain: Not on file  . Food insecurity - worry: Not on  file  . Food insecurity - inability: Not on file  . Transportation needs - medical: Not on file  . Transportation needs - non-medical: Not on file  Occupational History  . Occupation: retired  Tobacco Use  . Smoking status: Never Smoker  . Smokeless tobacco: Never Used  Substance and Sexual Activity  . Alcohol use: No  . Drug use: No  . Sexual activity: Not on file  Other Topics Concern  . Not on file  Social History Narrative  . Not on file    Family History  Problem Relation Age of Onset  . Colon cancer Mother 3  . Prostate cancer Father 62      [name], MD 06/19/2017, 11:56 AM

## 2017-06-26 ENCOUNTER — Ambulatory Visit: Payer: Medicare Other

## 2017-06-26 ENCOUNTER — Ambulatory Visit: Payer: Medicare Other | Admitting: Hematology and Oncology

## 2017-06-26 ENCOUNTER — Other Ambulatory Visit: Payer: Medicare Other

## 2017-06-29 ENCOUNTER — Ambulatory Visit: Payer: Medicare Other

## 2017-07-13 ENCOUNTER — Encounter: Payer: Self-pay | Admitting: Gynecologic Oncology

## 2017-07-13 NOTE — Progress Notes (Signed)
Patient seen in the office accompanied by her sister for post-operative follow up from [facility].  25 staples removed from the midline incision without difficulty.  No erythema, drainage, or signs of wound separation.  1/2 inch steri strips applied.  Abdominal binder applied to offer additional support to the incision.  All questions answered.  Advised patient to call for any questions or concerns and to follow up as scheduled or sooner if needed.  Reportable signs and symptoms reviewed as well.

## 2017-07-17 ENCOUNTER — Ambulatory Visit: Payer: Medicare Other | Admitting: Gynecology

## 2017-07-27 ENCOUNTER — Encounter: Payer: Self-pay | Admitting: Hematology and Oncology

## 2017-07-27 ENCOUNTER — Inpatient Hospital Stay: Payer: Medicare Other | Attending: Hematology and Oncology | Admitting: Hematology and Oncology

## 2017-07-27 DIAGNOSIS — D270 Benign neoplasm of right ovary: Secondary | ICD-10-CM

## 2017-07-27 DIAGNOSIS — C561 Malignant neoplasm of right ovary: Secondary | ICD-10-CM

## 2017-07-27 DIAGNOSIS — T148XXA Other injury of unspecified body region, initial encounter: Secondary | ICD-10-CM

## 2017-07-27 DIAGNOSIS — L24A9 Irritant contact dermatitis due friction or contact with other specified body fluids: Secondary | ICD-10-CM

## 2017-07-27 NOTE — Progress Notes (Signed)
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       03/20/2017 Imaging    GYN ultrasound was performed this revealed a small fundus measuring 5 cm there are 2 small fibroids within the fundus with the largest measuring 1.3 cm Within the endometrium there is a small amount a fluid the endometrial wall was slightly thickened at 4.8 mm the ultrasound was consistent with a small endometrial polyp  The right adnexa measured 7.4 x 6.7 cm contained a both a cystic and solid mass with a small amount of shadowing This is consistent with a dermoid tumor the left ovary was 2.6 x 1.5 x 1.0 cm There is no other adnexal masses no free fluid within the abdomen no ascites          04/10/2017 Surgery    Operation: Operative Laparscopy with  Peritoneal biopsy 2 hysteroscopy D&C Surgeon: Lynder Parents MD  Specimens: Peritoneal biopsy and cellular washings  Complications: None FIndings Small amount of ascites was found cellular washings were sent There was peritoneal studding And omental caking Even in steep Trendelenburg the ovaries and uterus were unable to be visualized due to the small intestines in the lower pelvis          04/10/2017 Pathology Results    Biopsy from [facility] was nondiagnostic.  Peritoneum biopsy did not reveal malignancy.  Endometrial biopsy also did not reveal malignancy.      04/13/2017 Tumor Marker    Patient's tumor was tested for the following markers: CA-125 Results of the tumor marker test revealed 324.1      04/20/2017 Tumor Marker    Patient's tumor was  tested for the following markers: CA-125 Results of the tumor marker test revealed 345.6      04/22/2017 Pathology Results    Peritoneum, biopsy - CARCINOMA. - SEE COMMENT.      04/22/2017 Procedure    CT-guided core biopsy performed of peritoneal tumor in the left lower quadrant.      04/24/2017 -  Chemotherapy    She received carboplatin and Taxol x 3 cycles followed by interval surgical debulking on 12/31. Chemotherapy was subsequently resumed for 3 more cycles      06/05/2017 Tumor Marker    Patient's tumor was tested for the following markers: CA-125 Results of the tumor marker test revealed 27.5      06/17/2017 Imaging    1. Considerable improvement in the prior omental caking, which now presents mainly as an indistinct stranding with slight nodularity along the left upper quadrant omentum. 2. Stable appearance of right ovarian mass with fatty and calcific elements classic for a dermoid. 3. There is accentuated enhancement in the walls of the common hepatic duct and common bile duct. A low-grade cholangitis is not readily excluded. 4. Other imaging findings of potential clinical significance: Mild cardiomegaly. Aortic Atherosclerosis (ICD10-I70.0). Lumbar scoliosis, spondylosis, degenerative disc disease, and pars defects at L5. These cause impingement at L5-S1 and lesser impingement at L3-4 and L4-5. 5. 65% compression fracture at L1 slightly worsened than on the prior exam, with associated vertebral sclerosis  and posterior retropulsion.      07/06/2017 Surgery    Bilateral Salpingo-Oophorectomy W/Omentectomy, Total Abd Hysterectomy & Radical Dissection For Debulking Right - Ureterolysis, With Or Without Repositioning Of Ureter For Retroperitoneal Fibrosis          07/06/2017 Pathology Results    A: Omentum, omentectomy - Omentum with minimal residual carcinoma, consistent with high grade serous carcinoma (microscopic; stage ypT3a) - Extensive treatment effect with  calcifications, fibrosis, histiocytes, and giant cell response - CRS score 3  B: Uterus with cervix and left ovary and fallopian tube, hysterectomy and left salpingo-oophorectomy - Cervix: Ectocervix and endocervix with no dysplasia or malignancy identified - Endometrium: Inactive with no hyperplasia, atypia, or malignancy identified - Myometrium: Adenomyosis and benign leiomyoma, size 1.2 cm - Serosa: Focal calcification and histiocytes suggestive of treatment effect, with no viable carcinoma identified - Ovary, left: Physiologic changes and no carcinoma identified - Fallopian tube, left: 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 and no malignancy identified  C: Ovary and fallopian tube, right, salpingo-oophorectomy - Right fallopian tube with focal residual carcinoma, consistent with high grade serous carcinoma, and serous tubal intraepithelial carcinoma (STIC) - Associated calcifications and histiocytes suggestive of treatment response - Right ovary with calcifications and histiocytes suggestive of treatment effect, with no definite viable carcinoma identified - Mature cystic teratoma, 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 Denies constipation or nausea  REVIEW OF SYSTEMS:   Constitutional: Denies fevers, chills or abnormal weight loss Eyes: Denies blurriness of vision Ears, nose, mouth, throat, and face: Denies mucositis or sore throat Respiratory: Denies cough, dyspnea or wheezes Cardiovascular: Denies palpitation, chest discomfort or lower extremity swelling Gastrointestinal:  Denies nausea, heartburn or change in  bowel habits Skin: Denies abnormal skin rashes Lymphatics: Denies new lymphadenopathy or easy bruising Neurological:Denies numbness, tingling or new weaknesses Behavioral/Psych: Mood is stable, no new changes  All other systems were reviewed with the patient and are negative.  I have reviewed the past medical history, past surgical history, social history and family history with the patient and they are unchanged from previous note.  ALLERGIES:  is allergic to aspirin; nsaids; and tramadol.  MEDICATIONS:  Current Outpatient Medications  Medication Sig Dispense Refill  . acetaminophen (TYLENOL) 650 MG CR tablet Take 1,300 mg by mouth every 8 (eight) hours.     Marland Kitchen alendronate (FOSAMAX) 70 MG tablet Take 70 mg by mouth every Sunday.     Marland Kitchen atorvastatin (LIPITOR) 80 MG tablet Take 40 mg by mouth at bedtime.     . Cholecalciferol (VITAMIN D3) 2000 units TABS Take 2,000 Units by mouth daily.    . citalopram (CELEXA) 10 MG tablet Take 10 mg by mouth at bedtime    . dexamethasone (DECADRON) 4 MG tablet Take 3 tabs the night before chemo and 3 tabs in the morning of chemo with food, every 21 days (Patient taking differently: Take 12 mg by mouth See admin instructions. Take 12 mg by mouth the night before chemo and 12 mg in the morning of chemo with food, every 21 days) 36 tablet 1  . gabapentin (NEURONTIN) 300 MG capsule Take 300  mg by mouth every 8 (eight) hours.     . Glucosamine Sulfate 500 MG TABS Take 500 mg by mouth 2 (two) times daily.     Marland Kitchen linagliptin (TRADJENTA) 5 MG TABS tablet Take 5 mg by mouth daily.     Marland Kitchen loratadine (CLARITIN) 10 MG tablet Take 10 mg by mouth at bedtime.     Marland Kitchen losartan (COZAAR) 25 MG tablet Take 25 mg by mouth daily.     . meclizine (ANTIVERT) 25 MG tablet Take 25 mg by mouth 2 (two) times daily as needed for dizziness.     . metFORMIN (GLUCOPHAGE) 1000 MG tablet Take 1,000 mg by mouth 2 (two) times daily with a meal.     . omeprazole (PRILOSEC) 40 MG capsule Take 40  mg by mouth daily.     . ondansetron (ZOFRAN) 8 MG tablet Start on day 3 after chemo. (Patient taking differently: Take 8 mg by mouth See admin instructions. Take 8 mg by mouth as needed for nausea and vomiting, start on day 3 after chemo.) 30 tablet 1  . pioglitazone (ACTOS) 15 MG tablet Take 15 mg by mouth at bedtime.     . prochlorperazine (COMPAZINE) 10 MG tablet Take 1 tablet (10 mg total) by mouth every 6 (six) hours as needed (Nausea or vomiting). 30 tablet 1  . vitamin B-12 (CYANOCOBALAMIN) 1000 MCG tablet Take 1,000 mcg by mouth daily.      No current facility-administered medications for this visit.     PHYSICAL EXAMINATION: ECOG PERFORMANCE STATUS: 2 - Symptomatic, <50% confined to bed  Vitals:   07/27/17 1237  BP: (!) 126/55  Pulse: 96  Resp: 18  Temp: 97.6 F (36.4 C)  SpO2: 100%   Filed Weights   07/27/17 1237  Weight: 234 lb 12.8 oz (106.5 kg)    GENERAL:alert, no distress and comfortable.  She is moderately obese SKIN: skin color, texture, turgor are normal, no rashes or significant lesions EYES: normal, Conjunctiva are pink and non-injected, sclera clear OROPHARYNX:no exudate, no erythema and lips, buccal mucosa, and tongue normal  NECK: supple, thyroid normal size, non-tender, without nodularity LYMPH:  no palpable lymphadenopathy in the cervical, axillary or inguinal LUNGS: clear to auscultation and percussion with normal breathing effort HEART: regular rate & rhythm and no murmurs and no lower extremity edema ABDOMEN:abdomen soft, non-tender and normal bowel sounds.  Well-healed surgical scar except for small area of the wound that requires packing Musculoskeletal:no cyanosis of digits and no clubbing  NEURO: alert & oriented x 3 with fluent speech, no focal motor/sensory deficits  LABORATORY DATA:  I have reviewed the data as listed    Component Value Date/Time   NA 139 06/05/2017 0746   K 4.1 06/05/2017 0746   CO2 21 (L) 06/05/2017 0746   GLUCOSE 279  (H) 06/05/2017 0746   BUN 15.0 06/05/2017 0746   CREATININE 0.9 06/05/2017 0746   CALCIUM 10.1 06/05/2017 0746   PROT 8.1 06/05/2017 0746   ALBUMIN 4.0 06/05/2017 0746   AST 22 06/05/2017 0746   ALT 36 06/05/2017 0746   ALKPHOS 98 06/05/2017 0746   BILITOT 0.44 06/05/2017 0746    No results found for: SPEP, UPEP  Lab Results  Component Value Date   WBC 6.9 06/05/2017   NEUTROABS 6.4 06/05/2017   HGB 10.9 (L) 06/05/2017   HCT 33.4 (L) 06/05/2017   MCV 91.7 06/05/2017   PLT 264 06/05/2017      Chemistry      Component  Value Date/Time   NA 139 06/05/2017 0746   K 4.1 06/05/2017 0746   CO2 21 (L) 06/05/2017 0746   BUN 15.0 06/05/2017 0746   CREATININE 0.9 06/05/2017 0746      Component Value Date/Time   CALCIUM 10.1 06/05/2017 0746   ALKPHOS 98 06/05/2017 0746   AST 22 06/05/2017 0746   ALT 36 06/05/2017 0746   BILITOT 0.44 06/05/2017 0746       ASSESSMENT & PLAN:  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 She have near complete response with neoadjuvant chemotherapy Incidentally, mature teratoma is also noted BRCA mutation analysis on her tissue sample came back negative for mutation. I am going to refer her to genetics for other testing. Her wound is not completely healed.  I recommend delaying initiation of chemotherapy and to her wound is almost healed.  Teratoma of ovary, right Teratoma is noted in the pathological specimen of her right ovary I will defer to her GYN surgeon for further management  Drainage from wound Her abdominal wound is not completely healed We will continue conservative management with wound dressing I will delayed initiation of chemotherapy until this is healed.   Orders Placed This Encounter  Procedures  . Ambulatory referral to Genetics    Referral Priority:   Routine    Referral Type:   Consultation    Referral Reason:   Specialty Services Required    Number of Visits  Requested:   1   All questions were answered. The patient knows to call the clinic with any problems, questions or concerns. No barriers to learning was detected. I spent 30 minutes counseling the patient face to face. The total time spent in the appointment was 40 minutes and more than 50% was on counseling and review of test results     Heath Lark, MD 07/27/2017 4:33 PM

## 2017-07-27 NOTE — Assessment & Plan Note (Signed)
I shared with the patient and her sister pathology report. She have near complete response with neoadjuvant chemotherapy Incidentally, mature teratoma is also noted BRCA mutation analysis on her tissue sample came back negative for mutation. I am going to refer her to genetics for other testing. Her wound is not completely healed.  I recommend delaying initiation of chemotherapy and to her wound is almost healed.

## 2017-07-27 NOTE — Assessment & Plan Note (Signed)
Teratoma is noted in the pathological specimen of her right ovary I will defer to her GYN surgeon for further management

## 2017-07-27 NOTE — Assessment & Plan Note (Signed)
Her abdominal wound is not completely healed We will continue conservative management with wound dressing I will delayed initiation of chemotherapy until this is healed.

## 2017-07-29 ENCOUNTER — Telehealth: Payer: Self-pay | Admitting: Hematology and Oncology

## 2017-07-29 NOTE — Telephone Encounter (Signed)
Return for no new orders or visits per 1/21 los.

## 2017-07-31 ENCOUNTER — Telehealth: Payer: Self-pay | Admitting: Hematology and Oncology

## 2017-07-31 ENCOUNTER — Ambulatory Visit: Payer: Medicare Other

## 2017-07-31 NOTE — Telephone Encounter (Signed)
Scheduled appt per 1/22 sch message - sent reminder letter in the mail with appt date and time   

## 2017-08-04 ENCOUNTER — Telehealth: Payer: Self-pay | Admitting: Hematology and Oncology

## 2017-08-04 ENCOUNTER — Other Ambulatory Visit: Payer: Self-pay | Admitting: Hematology and Oncology

## 2017-08-04 ENCOUNTER — Encounter: Payer: Self-pay | Admitting: Gynecologic Oncology

## 2017-08-04 NOTE — Telephone Encounter (Signed)
Gave patient avs and calendar with appts per 1/29 sch msg.

## 2017-08-04 NOTE — Progress Notes (Unsigned)
Patient seen in the office for wound assessment.  Vitals: 97.7, P96, R18, 129/57.  Alert, oriented, in no acute distress, with sister.

## 2017-08-05 ENCOUNTER — Telehealth: Payer: Self-pay | Admitting: Hematology and Oncology

## 2017-08-05 NOTE — Telephone Encounter (Signed)
Patients schedule is updated per 1/30 sch message

## 2017-08-14 ENCOUNTER — Inpatient Hospital Stay: Payer: Medicare Other

## 2017-08-14 ENCOUNTER — Encounter: Payer: Self-pay | Admitting: Hematology and Oncology

## 2017-08-14 ENCOUNTER — Inpatient Hospital Stay (HOSPITAL_BASED_OUTPATIENT_CLINIC_OR_DEPARTMENT_OTHER): Payer: Medicare Other | Admitting: Hematology and Oncology

## 2017-08-14 ENCOUNTER — Encounter: Payer: Self-pay | Admitting: Gynecology

## 2017-08-14 ENCOUNTER — Other Ambulatory Visit: Payer: Medicare Other

## 2017-08-14 ENCOUNTER — Inpatient Hospital Stay: Payer: Medicare Other | Attending: Hematology and Oncology | Admitting: Gynecology

## 2017-08-14 VITALS — BP 134/85 | HR 97 | Temp 97.6°F | Resp 18

## 2017-08-14 DIAGNOSIS — E119 Type 2 diabetes mellitus without complications: Secondary | ICD-10-CM

## 2017-08-14 DIAGNOSIS — Z5189 Encounter for other specified aftercare: Secondary | ICD-10-CM | POA: Insufficient documentation

## 2017-08-14 DIAGNOSIS — C561 Malignant neoplasm of right ovary: Secondary | ICD-10-CM | POA: Diagnosis present

## 2017-08-14 DIAGNOSIS — T451X5A Adverse effect of antineoplastic and immunosuppressive drugs, initial encounter: Secondary | ICD-10-CM

## 2017-08-14 DIAGNOSIS — C786 Secondary malignant neoplasm of retroperitoneum and peritoneum: Secondary | ICD-10-CM

## 2017-08-14 DIAGNOSIS — D6481 Anemia due to antineoplastic chemotherapy: Secondary | ICD-10-CM

## 2017-08-14 DIAGNOSIS — Z5111 Encounter for antineoplastic chemotherapy: Secondary | ICD-10-CM | POA: Diagnosis present

## 2017-08-14 DIAGNOSIS — C569 Malignant neoplasm of unspecified ovary: Secondary | ICD-10-CM

## 2017-08-14 LAB — CBC WITH DIFFERENTIAL/PLATELET
BASOS ABS: 0 10*3/uL (ref 0.0–0.1)
BASOS PCT: 0 %
EOS PCT: 0 %
Eosinophils Absolute: 0 10*3/uL (ref 0.0–0.5)
HCT: 35 % (ref 34.8–46.6)
Hemoglobin: 11.3 g/dL — ABNORMAL LOW (ref 11.6–15.9)
Lymphocytes Relative: 11 %
Lymphs Abs: 0.5 10*3/uL — ABNORMAL LOW (ref 0.9–3.3)
MCH: 31 pg (ref 25.1–34.0)
MCHC: 32.4 g/dL (ref 31.5–36.0)
MCV: 95.8 fL (ref 79.5–101.0)
Monocytes Absolute: 0.1 10*3/uL (ref 0.1–0.9)
Monocytes Relative: 2 %
Neutro Abs: 4 10*3/uL (ref 1.5–6.5)
Neutrophils Relative %: 87 %
PLATELETS: 295 10*3/uL (ref 145–400)
RBC: 3.66 MIL/uL — ABNORMAL LOW (ref 3.70–5.45)
RDW: 15.7 % — AB (ref 11.2–14.5)
WBC: 4.6 10*3/uL (ref 3.9–10.3)

## 2017-08-14 LAB — COMPREHENSIVE METABOLIC PANEL
ALBUMIN: 3.9 g/dL (ref 3.5–5.0)
ALK PHOS: 77 U/L (ref 40–150)
ALT: 23 U/L (ref 0–55)
ANION GAP: 16 — AB (ref 3–11)
AST: 17 U/L (ref 5–34)
BILIRUBIN TOTAL: 0.5 mg/dL (ref 0.2–1.2)
BUN: 18 mg/dL (ref 7–26)
CALCIUM: 9.3 mg/dL (ref 8.4–10.4)
CO2: 21 mmol/L — ABNORMAL LOW (ref 22–29)
Chloride: 102 mmol/L (ref 98–109)
Creatinine, Ser: 0.96 mg/dL (ref 0.60–1.10)
GFR calc Af Amer: >60 mL/min (ref >60)
GFR calc non Af Amer: >60 mL/min (ref >60)
Glucose, Bld: 264 mg/dL — ABNORMAL HIGH (ref 70–140)
Potassium: 4 mmol/L (ref 3.5–5.1)
Sodium: 139 mmol/L (ref 136–145)
TOTAL PROTEIN: 7.7 g/dL (ref 6.4–8.3)

## 2017-08-14 MED ORDER — SODIUM CHLORIDE 0.9 % IV SOLN
Freq: Once | INTRAVENOUS | Status: AC
  Administered 2017-08-14: 12:00:00 via INTRAVENOUS
  Filled 2017-08-14: qty 5

## 2017-08-14 MED ORDER — FAMOTIDINE IN NACL 20-0.9 MG/50ML-% IV SOLN
20.0000 mg | Freq: Once | INTRAVENOUS | Status: AC
  Administered 2017-08-14: 20 mg via INTRAVENOUS

## 2017-08-14 MED ORDER — PALONOSETRON HCL INJECTION 0.25 MG/5ML
0.2500 mg | Freq: Once | INTRAVENOUS | Status: AC
  Administered 2017-08-14: 0.25 mg via INTRAVENOUS

## 2017-08-14 MED ORDER — SODIUM CHLORIDE 0.9 % IV SOLN
Freq: Once | INTRAVENOUS | Status: AC
  Administered 2017-08-14: 11:00:00 via INTRAVENOUS

## 2017-08-14 MED ORDER — PALONOSETRON HCL INJECTION 0.25 MG/5ML
INTRAVENOUS | Status: AC
  Filled 2017-08-14: qty 5

## 2017-08-14 MED ORDER — SODIUM CHLORIDE 0.9 % IV SOLN
750.0000 mg | Freq: Once | INTRAVENOUS | Status: AC
  Administered 2017-08-14: 750 mg via INTRAVENOUS
  Filled 2017-08-14: qty 75

## 2017-08-14 MED ORDER — DIPHENHYDRAMINE HCL 50 MG/ML IJ SOLN
INTRAMUSCULAR | Status: AC
Start: 2017-08-14 — End: 2017-08-14
  Filled 2017-08-14: qty 1

## 2017-08-14 MED ORDER — SODIUM CHLORIDE 0.9% FLUSH
10.0000 mL | Freq: Once | INTRAVENOUS | Status: AC
  Filled 2017-08-14: qty 10

## 2017-08-14 MED ORDER — FAMOTIDINE IN NACL 20-0.9 MG/50ML-% IV SOLN
INTRAVENOUS | Status: AC
  Filled 2017-08-14: qty 50

## 2017-08-14 MED ORDER — OXYCODONE HCL 10 MG PO TABS: 10.0000 mg | ORAL_TABLET | ORAL | 0 refills | Status: DC | PRN

## 2017-08-14 MED ORDER — DEXAMETHASONE SODIUM PHOSPHATE 10 MG/ML IJ SOLN
INTRAMUSCULAR | Status: AC
  Filled 2017-08-14: qty 1

## 2017-08-14 MED ORDER — SODIUM CHLORIDE 0.9 % IV SOLN
175.0000 mg/m2 | Freq: Once | INTRAVENOUS | Status: AC
  Administered 2017-08-14: 402 mg via INTRAVENOUS
  Filled 2017-08-14: qty 67

## 2017-08-14 MED ORDER — DIPHENHYDRAMINE HCL 50 MG/ML IJ SOLN
50.0000 mg | Freq: Once | INTRAMUSCULAR | Status: AC
  Administered 2017-08-14: 50 mg via INTRAVENOUS

## 2017-08-14 NOTE — Progress Notes (Signed)
[place] OFFICE PROGRESS NOTE  Patient Care Team: Patient, No Pcp Per as PCP - General (General Practice)  SUMMARY OF ONCOLOGIC HISTORY: Oncology History   Genetic test on tissue sample did not show BRCA mutation     Ovarian cancer on right ([place])   11/12/2016 Imaging    Outside MRI imaging evaluating her back pain (compression fracture of T12) subsequent ultrasound showed a right adnexal mass measuring 7.4 x 6.7 cm containing both cystic and solid area 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       03/20/2017 Imaging    GYN ultrasound was performed this revealed a small fundus measuring 5 cm there are 2 small fibroids within the fundus with the largest measuring 1.3 cm Within the endometrium there is a small amount a fluid the endometrial wall was slightly thickened at 4.8 mm the ultrasound was consistent with a small endometrial polyp  The right adnexa measured 7.4 x 6.7 cm contained a both a cystic and solid mass with a small amount of shadowing This is consistent with a dermoid tumor the left ovary was 2.6 x 1.5 x 1.0 cm There is no other adnexal masses no free fluid within the abdomen no ascites          04/10/2017 Surgery    Operation: Operative Laparscopy with  Peritoneal biopsy 2 hysteroscopy D&C Surgeon: Lynder Parents MD  Specimens: Peritoneal biopsy and cellular washings  Complications: None FIndings Small amount of ascites was found cellular washings were sent There was peritoneal studding And omental caking Even in steep Trendelenburg the ovaries and uterus were unable to be visualized due to the small intestines in the lower pelvis          04/10/2017 Pathology Results    Biopsy from [facility] was nondiagnostic.  Peritoneum biopsy did not reveal malignancy.  Endometrial biopsy also did not reveal malignancy.      04/13/2017 Tumor Marker    Patient's tumor was tested for the following markers: CA-125 Results of the tumor  marker test revealed 324.1      04/20/2017 Tumor Marker    Patient's tumor was tested for the following markers: CA-125 Results of the tumor marker test revealed 345.6      04/22/2017 Pathology Results    Peritoneum, biopsy - CARCINOMA. - SEE COMMENT.      04/22/2017 Procedure    CT-guided core biopsy performed of peritoneal tumor in the left lower quadrant.      04/24/2017 -  Chemotherapy    She received carboplatin and Taxol x 3 cycles followed by interval surgical debulking on 12/31. Chemotherapy was subsequently resumed for 3 more cycles      06/05/2017 Tumor Marker    Patient's tumor was tested for the following markers: CA-125 Results of the tumor marker test revealed 27.5      06/17/2017 Imaging    1. Considerable improvement in the prior omental caking, which now presents mainly as an indistinct stranding with slight nodularity along the left upper quadrant omentum. 2. Stable appearance of right ovarian mass with fatty and calcific elements classic for a dermoid. 3. There is accentuated enhancement in the walls of the common hepatic duct and common bile duct. A low-grade cholangitis is not readily excluded. 4. Other imaging findings of potential clinical significance: Mild cardiomegaly. Aortic Atherosclerosis (ICD10-I70.0). Lumbar scoliosis, spondylosis, degenerative disc disease, and pars defects at L5. These cause impingement at L5-S1 and lesser impingement at L3-4 and L4-5. 5.  65% compression fracture at L1 slightly worsened than on the prior exam, with associated vertebral sclerosis and posterior retropulsion.      07/06/2017 Surgery    Bilateral Salpingo-Oophorectomy W/Omentectomy, Total Abd Hysterectomy & Radical Dissection For Debulking Right - Ureterolysis, With Or Without Repositioning Of Ureter For Retroperitoneal Fibrosis          07/06/2017 Pathology Results    A: Omentum, omentectomy - Omentum with minimal residual carcinoma, consistent with high  grade serous carcinoma (microscopic; stage ypT3a) - Extensive treatment effect with calcifications, fibrosis, histiocytes, and giant cell response - CRS score 3  B: Uterus with cervix and left ovary and fallopian tube, hysterectomy and left salpingo-oophorectomy - Cervix: Ectocervix and endocervix with no dysplasia or malignancy identified - Endometrium: Inactive with no hyperplasia, atypia, or malignancy identified - Myometrium: Adenomyosis and benign leiomyoma, size 1.2 cm - Serosa: Focal calcification and histiocytes suggestive of treatment effect, with no viable carcinoma identified - Ovary, left: Physiologic changes and no carcinoma identified - Fallopian tube, left: 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 and no malignancy identified  C: Ovary and fallopian tube, right, salpingo-oophorectomy - Right fallopian tube with focal residual carcinoma, consistent with high grade serous carcinoma, and serous tubal intraepithelial carcinoma (STIC) - Associated calcifications and histiocytes suggestive of treatment response - Right ovary with calcifications and histiocytes suggestive of treatment effect, with no definite viable carcinoma identified - Mature cystic teratoma, 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 She returns for further follow-up She feels well The wound has completely healed She denies residual peripheral neuropathy from treatment  REVIEW OF SYSTEMS:   Constitutional: Denies fevers, chills or abnormal weight loss Eyes: Denies blurriness of vision Ears, nose, mouth, throat, and face: Denies mucositis or sore throat Respiratory: Denies cough, dyspnea or wheezes Cardiovascular: Denies palpitation, chest discomfort or lower extremity swelling Gastrointestinal:  Denies nausea, heartburn or change in bowel habits Skin: Denies abnormal skin rashes Lymphatics: Denies new lymphadenopathy or  easy bruising Neurological:Denies numbness, tingling or new weaknesses Behavioral/Psych: Mood is stable, no new changes  All other systems were reviewed with the patient and are negative.  I have reviewed the past medical history, past surgical history, social history and family history with the patient and they are unchanged from previous note.  ALLERGIES:  is allergic to aspirin; nsaids; and tramadol.  MEDICATIONS:  Current Outpatient Medications  Medication Sig Dispense Refill  . acetaminophen (TYLENOL) 650 MG CR tablet Take 1,300 mg by mouth every 8 (eight) hours.     Marland Kitchen alendronate (FOSAMAX) 70 MG tablet Take 70 mg by mouth every Sunday.     Marland Kitchen atorvastatin (LIPITOR) 80 MG tablet Take 40 mg by mouth at bedtime.     . Cholecalciferol (VITAMIN D3) 2000 units TABS Take 2,000 Units by mouth daily.    . citalopram (CELEXA) 10 MG tablet Take 10 mg by mouth at bedtime    . dexamethasone (DECADRON) 4 MG tablet Take 3 tabs the night before chemo and 3 tabs in the morning of chemo with food, every 21 days (Patient taking differently: Take 12 mg by mouth See admin instructions. Take 12 mg by mouth the night before chemo and 12 mg in the morning of chemo with food, every 21 days) 36 tablet 1  . gabapentin (NEURONTIN) 300 MG capsule Take by mouth.    . Glucosamine Sulfate 500 MG TABS Take 500 mg by  mouth 2 (two) times daily.     Marland Kitchen linagliptin (TRADJENTA) 5 MG TABS tablet Take 5 mg by mouth daily.     Marland Kitchen loratadine (CLARITIN) 10 MG tablet Take 10 mg by mouth at bedtime.     Marland Kitchen losartan (COZAAR) 25 MG tablet Take 25 mg by mouth daily.     . meclizine (ANTIVERT) 25 MG tablet Take 25 mg by mouth 2 (two) times daily as needed for dizziness.     . metFORMIN (GLUCOPHAGE) 1000 MG tablet Take 1,000 mg by mouth 2 (two) times daily with a meal.     . omeprazole (PRILOSEC) 40 MG capsule Take 40 mg by mouth daily.     . ondansetron (ZOFRAN) 8 MG tablet Start on day 3 after chemo. (Patient taking differently: Take 8  mg by mouth See admin instructions. Take 8 mg by mouth as needed for nausea and vomiting, start on day 3 after chemo.) 30 tablet 1  . oxyCODONE 10 MG TABS Take 1 tablet (10 mg total) by mouth every 4 (four) hours as needed for severe pain. 30 tablet 0  . pioglitazone (ACTOS) 15 MG tablet Take 15 mg by mouth at bedtime.     . prochlorperazine (COMPAZINE) 10 MG tablet Take 1 tablet (10 mg total) by mouth every 6 (six) hours as needed (Nausea or vomiting). 30 tablet 1  . vitamin B-12 (CYANOCOBALAMIN) 1000 MCG tablet Take 1,000 mcg by mouth daily.      No current facility-administered medications for this visit.    Facility-Administered Medications Ordered in Other Visits  Medication Dose Route Frequency Provider Last Rate Last Dose  . sodium chloride flush (NS) 0.9 % injection 10 mL  10 mL Intracatheter Once [name], Safiatou Islam, MD        PHYSICAL EXAMINATION: ECOG PERFORMANCE STATUS: 1 - Symptomatic but completely ambulatory GENERAL:alert, no distress and comfortable SKIN: skin color, texture, turgor are normal, no rashes or significant lesions EYES: normal, Conjunctiva are pink and non-injected, sclera clear OROPHARYNX:no exudate, no erythema and lips, buccal mucosa, and tongue normal  NECK: supple, thyroid normal size, non-tender, without nodularity LYMPH:  no palpable lymphadenopathy in the cervical, axillary or inguinal LUNGS: clear to auscultation and percussion with normal breathing effort HEART: regular rate & rhythm and no murmurs and no lower extremity edema ABDOMEN:abdomen soft, non-tender and normal bowel sounds Musculoskeletal:no cyanosis of digits and no clubbing  NEURO: alert & oriented x 3 with fluent speech, no focal motor/sensory deficits  LABORATORY DATA:  I have reviewed the data as listed    Component Value Date/Time   NA 139 08/14/2017 0908   NA 139 06/05/2017 0746   K 4.0 08/14/2017 0908   K 4.1 06/05/2017 0746   CL 102 08/14/2017 0908   CO2 21 (L) 08/14/2017 0908    CO2 21 (L) 06/05/2017 0746   GLUCOSE 264 (H) 08/14/2017 0908   GLUCOSE 279 (H) 06/05/2017 0746   BUN 18 08/14/2017 0908   BUN 15.0 06/05/2017 0746   CREATININE 0.96 08/14/2017 0908   CREATININE 0.9 06/05/2017 0746   CALCIUM 9.3 08/14/2017 0908   CALCIUM 10.1 06/05/2017 0746   PROT 7.7 08/14/2017 0908   PROT 8.1 06/05/2017 0746   ALBUMIN 3.9 08/14/2017 0908   ALBUMIN 4.0 06/05/2017 0746   AST 17 08/14/2017 0908   AST 22 06/05/2017 0746   ALT 23 08/14/2017 0908   ALT 36 06/05/2017 0746   ALKPHOS 77 08/14/2017 0908   ALKPHOS 98 06/05/2017 0746   BILITOT 0.5  08/14/2017 0908   BILITOT 0.44 06/05/2017 0746   GFRNONAA >60 08/14/2017 0908   GFRAA >60 08/14/2017 0908    No results found for: SPEP, UPEP  Lab Results  Component Value Date   WBC 4.6 08/14/2017   NEUTROABS 4.0 08/14/2017   HGB 11.3 (L) 08/14/2017   HCT 35.0 08/14/2017   MCV 95.8 08/14/2017   PLT 295 08/14/2017      Chemistry      Component Value Date/Time   NA 139 08/14/2017 0908   NA 139 06/05/2017 0746   K 4.0 08/14/2017 0908   K 4.1 06/05/2017 0746   CL 102 08/14/2017 0908   CO2 21 (L) 08/14/2017 0908   CO2 21 (L) 06/05/2017 0746   BUN 18 08/14/2017 0908   BUN 15.0 06/05/2017 0746   CREATININE 0.96 08/14/2017 0908   CREATININE 0.9 06/05/2017 0746      Component Value Date/Time   CALCIUM 9.3 08/14/2017 0908   CALCIUM 10.1 06/05/2017 0746   ALKPHOS 77 08/14/2017 0908   ALKPHOS 98 06/05/2017 0746   AST 17 08/14/2017 0908   AST 22 06/05/2017 0746   ALT 23 08/14/2017 0908   ALT 36 06/05/2017 0746   BILITOT 0.5 08/14/2017 0908   BILITOT 0.44 06/05/2017 0746       ASSESSMENT & PLAN:  Ovarian cancer on right ([place]) Her wound has completely healed We will resume chemotherapy as scheduled She will complete 3 more cycles of chemotherapy in an adjuvant fashion  Type 2 diabetes mellitus without complication ([facility]) Her blood sugar is elevated induced by steroids I recommend increase water intake as  tolerated and to monitor her blood sugar carefully  Anemia due to antineoplastic chemotherapy This is likely due to recent treatment. The patient denies recent history of bleeding such as epistaxis, hematuria or hematochezia. She is asymptomatic from the anemia. I will observe for now.  She does not require transfusion now. I will continue the chemotherapy at current dose without dosage adjustment.  If the anemia gets progressive worse in the future, I might have to delay her treatment or adjust the chemotherapy dose.    No orders of the defined types were placed in this encounter.  All questions were answered. The patient knows to call the clinic with any problems, questions or concerns. No barriers to learning was detected. I spent 15 minutes counseling the patient face to face. The total time spent in the appointment was 20 minutes and more than 50% was on counseling and review of test results     Heath Lark, MD 08/14/2017 10:27 AM

## 2017-08-14 NOTE — Assessment & Plan Note (Signed)
Her wound has completely healed We will resume chemotherapy as scheduled She will complete 3 more cycles of chemotherapy in an adjuvant fashion

## 2017-08-14 NOTE — Progress Notes (Signed)
Pt questioned potential need for insulin d/t CBG of 264 today. Pt took steroid last night, this morning, and to receive with pre-meds today. Called to speak with Cherre Huger, RN. Return call to notify this RN that pt does not require insulin today. Pt taking metformin at home.

## 2017-08-14 NOTE — Assessment & Plan Note (Signed)

## 2017-08-14 NOTE — Assessment & Plan Note (Signed)
Her blood sugar is elevated induced by steroids I recommend increase water intake as tolerated and to monitor her blood sugar carefully

## 2017-08-14 NOTE — Patient Instructions (Signed)
[facility] Discharge Instructions for Patients Receiving Chemotherapy  Today you received the following chemotherapy agents:  paclitaxel (Taxol) and carboplatin.  To help prevent nausea and vomiting after your treatment, we encourage you to take your nausea medication as prescribed.   If you develop nausea and vomiting that is not controlled by your nausea medication, call the clinic.   BELOW ARE SYMPTOMS THAT SHOULD BE REPORTED IMMEDIATELY:  *FEVER GREATER THAN 100.5 F  *CHILLS WITH OR WITHOUT FEVER  NAUSEA AND VOMITING THAT IS NOT CONTROLLED WITH YOUR NAUSEA MEDICATION  *UNUSUAL SHORTNESS OF BREATH  *UNUSUAL BRUISING OR BLEEDING  TENDERNESS IN MOUTH AND THROAT WITH OR WITHOUT PRESENCE OF ULCERS  *URINARY PROBLEMS  *BOWEL PROBLEMS  UNUSUAL RASH Items with * indicate a potential emergency and should be followed up as soon as possible.  Feel free to call the clinic should you have any questions or concerns. The clinic phone number is [phone].  Please show the [place] at check-in to the Emergency Department and triage nurse.

## 2017-08-14 NOTE — Progress Notes (Signed)
Consult Note: Gyn-Onc   Cheryl Melton 67 y.o. female  Chief Complaint  Patient presents with  . Ovarian cancer on right [facility])    Assessment : Apparent stage III C ovarian carcinoma status post 3 cycles of carboplatin and Taxol neoadjuvant chemotherapy with an excellent response.  Patient is now recovering nicely from interval debulking performed on July 08, 2017.  She will resume cycle 4 chemotherapy today.   Plan: Surgical findings and her recovery were reviewed with the patient and her family.  She is given the okay to return to full levels of activity.  Patient will resume chemotherapy today.   Our plan is to continue 3 additional cycles of carboplatin and Taxol.  Approximate month after her last cycle will obtain another CT scan.  We will continue to monitor Ca1 25 values at each cycle.  I will plan on seeing the patient back after her CT scan.   HPI: 67 year old white married female gravida  seen in consultation request of Dr.[name] regarding management of a newly diagnosed advanced ovarian cancer. 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 There is no free fluid or other adnexal masses noted. She underwent laparoscopy on 04/10/2017 with the surprise finding of peritoneal studding, omental cake, and a small amount of ascites. Biopsy of peritoneal implants was obtained but the result is pending at the time of this dictation. No tumor markers have been obtained.  Retrospectively the patient denies any particular symptoms that might been associated with ovarian cancer. She has no family history of ovarian or breast cancer. Her mother had colon cancer. She has no other gynecologic history although she has primary infertility.  Patient was treated with neoadjuvant chemotherapy  receiving 3 cycles of carboplatin and Taxol the last being June 05, 2017.  The Ca1 25 fell from 345 units/mL to 27.5 units/mL after 3 cycles.  CT scan after 3 cycles showed significant improvement in her omental cake and a persistent right ovarian mass that has fatty and calcified elements consistent with a dermoid.  She also has a compression fracture at L1.  As planned, the patient underwent interval debulking on July 08, 2017.  This included total abdominal hysterectomy, bilateral salpingo-nephrectomy, infracolic omentectomy.  She had an R0 resection.  Final pathology showed minimal residual carcinoma in her omentum with findings of extensive treatment effect with calcifications fibrosis histiocytes and giant cell response.  The right ovary had focal residual carcinoma consistent with high-grade carcinoma.  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  She had an uncomplicated postoperative course.    Review of Systems:10 point review of systems is negative except as noted in interval history.   Vitals: Blood pressure 134/85, pulse 97, temperature 97.6 F (36.4 C), temperature source Oral, resp. rate 18, SpO2 98 %.  Physical Exam: General : The patient is a healthy woman in no acute distress.  HEENT: normocephalic, extraoccular movements normal; neck is supple without thyromegally  Lynphnodes: Supraclavicular and inguinal nodes not enlarged  Abdomen: obese, Soft, non-tender, no ascites, no organomegally, no masses, no hernias, midline incision is completely healed. Pelvic:  EGBUS: Normal female  Vagina: Normal, no lesions  Urethra and Bladder: Normal, non-tender  Cervix: Surgically absent Uterus:   Surgically absent Bi-manual examination: Non-tender; no adenxal masses or nodularity  Rectal: normal sphincter tone, no masses, no blood  Lower extremities: No edema or varicosities. Normal range of motion      Allergies  Allergen Reactions  . Aspirin Other (See  Comments)    NSAIDS( advised not to take per hematology)  . Nsaids Other (See Comments)    Gastritis  . Tramadol Other (See Comments)    Headache, vomiting and disorientation     Past Medical History:  Diagnosis Date  . Anxiety   . Cancer ([place])   . Diabetes mellitus without complication ([place])   . GERD (gastroesophageal reflux disease)   . Osteoporosis   . Parathyroid adenoma     Past Surgical History:  Procedure Laterality Date  . CHOLECYSTECTOMY    . COLONOSCOPY WITH ESOPHAGOGASTRODUODENOSCOPY (EGD)    . DILATION AND CURETTAGE OF UTERUS    . ELBOW SURGERY    . PARATHYROIDECTOMY  10/21/2016   One side done pt not sure which side.   Marland Kitchen ROTATOR CUFF REPAIR Right   . TONSILLECTOMY      Current Outpatient Medications  Medication Sig Dispense Refill  . acetaminophen (TYLENOL) 650 MG CR tablet Take 1,300 mg by mouth every 8 (eight) hours.     Marland Kitchen alendronate (FOSAMAX) 70 MG tablet Take 70 mg by mouth every Sunday.     Marland Kitchen atorvastatin (LIPITOR) 80 MG tablet Take 40 mg by mouth at bedtime.     . Cholecalciferol (VITAMIN D3) 2000 units TABS Take 2,000 Units by mouth daily.    . citalopram (CELEXA) 10 MG tablet Take 10 mg by mouth at bedtime    . dexamethasone (DECADRON) 4 MG tablet Take 3 tabs the night before chemo and 3 tabs in the morning of chemo with food, every 21 days (Patient taking differently: Take 12 mg by mouth See admin instructions. Take 12 mg by mouth the night before chemo and 12 mg in the morning of chemo with food, every 21 days) 36 tablet 1  . Glucosamine Sulfate 500 MG TABS Take 500 mg by mouth 2 (two) times daily.     Marland Kitchen linagliptin (TRADJENTA) 5 MG TABS tablet Take 5 mg by mouth daily.     Marland Kitchen loratadine (CLARITIN) 10 MG tablet Take 10 mg by mouth at bedtime.     Marland Kitchen losartan (COZAAR) 25 MG tablet Take 25 mg by mouth daily.     . meclizine (ANTIVERT) 25 MG tablet Take 25 mg by mouth 2 (two) times daily as needed for dizziness.     . metFORMIN (GLUCOPHAGE) 1000 MG tablet  Take 1,000 mg by mouth 2 (two) times daily with a meal.     . omeprazole (PRILOSEC) 40 MG capsule Take 40 mg by mouth daily.     . ondansetron (ZOFRAN) 8 MG tablet Start on day 3 after chemo. (Patient taking differently: Take 8 mg by mouth See admin instructions. Take 8 mg by mouth as needed for nausea and vomiting, start on day 3 after chemo.) 30 tablet 1  . pioglitazone (ACTOS) 15 MG tablet Take 15 mg by mouth at bedtime.     . prochlorperazine (COMPAZINE) 10 MG tablet Take 1 tablet (10 mg total) by mouth every 6 (six) hours as needed (Nausea or vomiting). 30 tablet 1  . vitamin B-12 (CYANOCOBALAMIN) 1000 MCG tablet Take 1,000 mcg by mouth daily.     Marland Kitchen gabapentin (NEURONTIN) 300 MG capsule Take 300 mg by mouth every 8 (eight) hours.      No current facility-administered medications for this  visit.    Facility-Administered Medications Ordered in Other Visits  Medication Dose Route Frequency Provider Last Rate Last Dose  . sodium chloride flush (NS) 0.9 % injection 10 mL  10 mL Intracatheter Once Heath Lark, MD        Social History   Socioeconomic History  . Marital status: Married    Spouse name: Mikeal Hawthorne  . Number of children: 0  . Years of education: Not on file  . Highest education level: Not on file  Social Needs  . Financial resource strain: Not on file  . Food insecurity - worry: Not on file  . Food insecurity - inability: Not on file  . Transportation needs - medical: Not on file  . Transportation needs - non-medical: Not on file  Occupational History  . Occupation: retired  Tobacco Use  . Smoking status: Never Smoker  . Smokeless tobacco: Never Used  Substance and Sexual Activity  . Alcohol use: No  . Drug use: No  . Sexual activity: Not on file  Other Topics Concern  . Not on file  Social History Narrative  . Not on file    Family History  Problem Relation Age of Onset  . Colon cancer Mother 67  . Prostate cancer Father 42      [name],  MD 08/14/2017, 9:38 AM

## 2017-08-15 LAB — CA 125: Cancer Antigen (CA) 125: 14.6 U/mL (ref 0.0–38.1)

## 2017-08-17 ENCOUNTER — Inpatient Hospital Stay: Payer: Medicare Other

## 2017-08-17 ENCOUNTER — Telehealth: Payer: Self-pay | Admitting: *Deleted

## 2017-08-17 VITALS — BP 118/76 | HR 99 | Temp 97.9°F | Resp 20

## 2017-08-17 DIAGNOSIS — Z5111 Encounter for antineoplastic chemotherapy: Secondary | ICD-10-CM | POA: Diagnosis not present

## 2017-08-17 DIAGNOSIS — C561 Malignant neoplasm of right ovary: Secondary | ICD-10-CM

## 2017-08-17 MED ORDER — PEGFILGRASTIM INJECTION 6 MG/0.6ML ~~LOC~~
6.0000 mg | PREFILLED_SYRINGE | Freq: Once | SUBCUTANEOUS | Status: AC
  Administered 2017-08-17: 6 mg via SUBCUTANEOUS

## 2017-08-17 NOTE — Patient Instructions (Signed)
Pegfilgrastim injection What is this medicine? PEGFILGRASTIM (PEG fil gra stim) is a long-acting granulocyte colony-stimulating factor that stimulates the growth of neutrophils, a type of white blood cell important in the body's fight against infection. It is used to reduce the incidence of fever and infection in patients with certain types of cancer who are receiving chemotherapy that affects the bone marrow, and to increase survival after being exposed to high doses of radiation. This medicine may be used for other purposes; ask your health care provider or pharmacist if you have questions. COMMON BRAND NAME(S): Neulasta What should I tell my health care provider before I take this medicine? They need to know if you have any of these conditions: -kidney disease -latex allergy -ongoing radiation therapy -sickle cell disease -skin reactions to acrylic adhesives (On-Body Injector only) -an unusual or allergic reaction to pegfilgrastim, filgrastim, other medicines, foods, dyes, or preservatives -pregnant or trying to get pregnant -breast-feeding How should I use this medicine? This medicine is for injection under the skin. If you get this medicine at home, you will be taught how to prepare and give the pre-filled syringe or how to use the On-body Injector. Refer to the patient Instructions for Use for detailed instructions. Use exactly as directed. Tell your healthcare provider immediately if you suspect that the On-body Injector may not have performed as intended or if you suspect the use of the On-body Injector resulted in a missed or partial dose. It is important that you put your used needles and syringes in a special sharps container. Do not put them in a trash can. If you do not have a sharps container, call your pharmacist or healthcare provider to get one. Talk to your pediatrician regarding the use of this medicine in children. While this drug may be prescribed for selected conditions,  precautions do apply. Overdosage: If you think you have taken too much of this medicine contact a poison control center or emergency room at once. NOTE: This medicine is only for you. Do not share this medicine with others. What if I miss a dose? It is important not to miss your dose. Call your doctor or health care professional if you miss your dose. If you miss a dose due to an On-body Injector failure or leakage, a new dose should be administered as soon as possible using a single prefilled syringe for manual use. What may interact with this medicine? Interactions have not been studied. Give your health care provider a list of all the medicines, herbs, non-prescription drugs, or dietary supplements you use. Also tell them if you smoke, drink alcohol, or use illegal drugs. Some items may interact with your medicine. This list may not describe all possible interactions. Give your health care provider a list of all the medicines, herbs, non-prescription drugs, or dietary supplements you use. Also tell them if you smoke, drink alcohol, or use illegal drugs. Some items may interact with your medicine. What should I watch for while using this medicine? You may need blood work done while you are taking this medicine. If you are going to need a MRI, CT scan, or other procedure, tell your doctor that you are using this medicine (On-Body Injector only). What side effects may I notice from receiving this medicine? Side effects that you should report to your doctor or health care professional as soon as possible: -allergic reactions like skin rash, itching or hives, swelling of the face, lips, or tongue -dizziness -fever -pain, redness, or irritation at site   where injected -pinpoint red spots on the skin -red or dark-brown urine -shortness of breath or breathing problems -stomach or side pain, or pain at the shoulder -swelling -tiredness -trouble passing urine or change in the amount of urine Side  effects that usually do not require medical attention (report to your doctor or health care professional if they continue or are bothersome): -bone pain -muscle pain This list may not describe all possible side effects. Call your doctor for medical advice about side effects. You may report side effects to FDA at 1-800-FDA-1088. Where should I keep my medicine? Keep out of the reach of children. Store pre-filled syringes in a refrigerator between 2 and 8 degrees C (36 and 46 degrees F). Do not freeze. Keep in carton to protect from light. Throw away this medicine if it is left out of the refrigerator for more than 48 hours. Throw away any unused medicine after the expiration date. NOTE: This sheet is a summary. It may not cover all possible information. If you have questions about this medicine, talk to your doctor, pharmacist, or health care provider.  2018 Elsevier/Gold Standard (2016-06-19 12:58:03)  

## 2017-08-17 NOTE — Telephone Encounter (Signed)
Documents faxed

## 2017-08-17 NOTE — Telephone Encounter (Signed)
-----   Message from Johann Capers, RN sent at 08/14/2017  4:22 PM EST ----- Regarding: AVS Fax to [place]! Whoever is at the desk Monday, the pt had a request that her AVS from 11/30 and 2/8 be faxed to her insurance company. [employer] must be called at [phone] and then paperwork can be faxed to [phone]. This paperwork is required by insurance to cover pt treatment costs. Thank you! -S

## 2017-09-04 ENCOUNTER — Telehealth: Payer: Self-pay | Admitting: Hematology and Oncology

## 2017-09-04 ENCOUNTER — Inpatient Hospital Stay: Payer: Medicare Other

## 2017-09-04 ENCOUNTER — Other Ambulatory Visit: Payer: Medicare Other

## 2017-09-04 ENCOUNTER — Encounter: Payer: Self-pay | Admitting: Hematology and Oncology

## 2017-09-04 ENCOUNTER — Inpatient Hospital Stay: Payer: Medicare Other | Attending: Hematology and Oncology | Admitting: Hematology and Oncology

## 2017-09-04 VITALS — BP 132/78 | HR 101 | Temp 98.0°F | Resp 18 | Ht 67.0 in | Wt 235.5 lb

## 2017-09-04 VITALS — HR 98

## 2017-09-04 DIAGNOSIS — Z5189 Encounter for other specified aftercare: Secondary | ICD-10-CM | POA: Diagnosis not present

## 2017-09-04 DIAGNOSIS — C569 Malignant neoplasm of unspecified ovary: Secondary | ICD-10-CM

## 2017-09-04 DIAGNOSIS — Z5111 Encounter for antineoplastic chemotherapy: Secondary | ICD-10-CM | POA: Insufficient documentation

## 2017-09-04 DIAGNOSIS — C561 Malignant neoplasm of right ovary: Secondary | ICD-10-CM | POA: Insufficient documentation

## 2017-09-04 DIAGNOSIS — R911 Solitary pulmonary nodule: Secondary | ICD-10-CM | POA: Diagnosis not present

## 2017-09-04 DIAGNOSIS — R918 Other nonspecific abnormal finding of lung field: Secondary | ICD-10-CM

## 2017-09-04 DIAGNOSIS — E119 Type 2 diabetes mellitus without complications: Secondary | ICD-10-CM | POA: Diagnosis not present

## 2017-09-04 LAB — CBC WITH DIFFERENTIAL/PLATELET
BASOS ABS: 0 10*3/uL (ref 0.0–0.1)
BASOS PCT: 0 %
Eosinophils Absolute: 0 10*3/uL (ref 0.0–0.5)
Eosinophils Relative: 0 %
HCT: 35 % (ref 34.8–46.6)
HEMOGLOBIN: 11 g/dL — AB (ref 11.6–15.9)
LYMPHS PCT: 7 %
Lymphs Abs: 0.6 10*3/uL — ABNORMAL LOW (ref 0.9–3.3)
MCH: 30.9 pg (ref 25.1–34.0)
MCHC: 31.4 g/dL — ABNORMAL LOW (ref 31.5–36.0)
MCV: 98.3 fL (ref 79.5–101.0)
MONO ABS: 0.1 10*3/uL (ref 0.1–0.9)
Monocytes Relative: 1 %
NEUTROS ABS: 7.7 10*3/uL — AB (ref 1.5–6.5)
NEUTROS PCT: 92 %
Platelets: 209 10*3/uL (ref 145–400)
RBC: 3.56 MIL/uL — AB (ref 3.70–5.45)
RDW: 15.8 % — AB (ref 11.2–14.5)
WBC: 8.4 10*3/uL (ref 3.9–10.3)

## 2017-09-04 LAB — COMPREHENSIVE METABOLIC PANEL
ALK PHOS: 98 U/L (ref 40–150)
ALT: 26 U/L (ref 0–55)
ANION GAP: 16 — AB (ref 3–11)
AST: 16 U/L (ref 5–34)
Albumin: 3.9 g/dL (ref 3.5–5.0)
BILIRUBIN TOTAL: 0.4 mg/dL (ref 0.2–1.2)
BUN: 18 mg/dL (ref 7–26)
CALCIUM: 9.4 mg/dL (ref 8.4–10.4)
CO2: 19 mmol/L — ABNORMAL LOW (ref 22–29)
Chloride: 103 mmol/L (ref 98–109)
Creatinine, Ser: 0.84 mg/dL (ref 0.60–1.10)
GFR calc non Af Amer: >60 mL/min (ref >60)
Glucose, Bld: 239 mg/dL — ABNORMAL HIGH (ref 70–140)
POTASSIUM: 4.3 mmol/L (ref 3.5–5.1)
SODIUM: 138 mmol/L (ref 136–145)
TOTAL PROTEIN: 7.7 g/dL (ref 6.4–8.3)

## 2017-09-04 MED ORDER — FOSAPREPITANT DIMEGLUMINE INJECTION 150 MG
Freq: Once | INTRAVENOUS | Status: AC
  Administered 2017-09-04: 11:00:00 via INTRAVENOUS
  Filled 2017-09-04: qty 5

## 2017-09-04 MED ORDER — FAMOTIDINE IN NACL 20-0.9 MG/50ML-% IV SOLN: 20.0000 mg | Freq: Once | INTRAVENOUS | Status: DC

## 2017-09-04 MED ORDER — CARBOPLATIN CHEMO INJECTION 600 MG/60ML
750.0000 mg | Freq: Once | INTRAVENOUS | Status: AC
  Administered 2017-09-04: 750 mg via INTRAVENOUS
  Filled 2017-09-04: qty 75

## 2017-09-04 MED ORDER — PALONOSETRON HCL INJECTION 0.25 MG/5ML
0.2500 mg | Freq: Once | INTRAVENOUS | Status: AC
  Administered 2017-09-04: 0.25 mg via INTRAVENOUS

## 2017-09-04 MED ORDER — COLD PACK MISC ONCOLOGY
1.0000 | Freq: Once | Status: DC | PRN
  Filled 2017-09-04: qty 1

## 2017-09-04 MED ORDER — DIPHENHYDRAMINE HCL 50 MG/ML IJ SOLN
50.0000 mg | Freq: Once | INTRAMUSCULAR | Status: AC
  Administered 2017-09-04: 50 mg via INTRAVENOUS

## 2017-09-04 MED ORDER — SODIUM CHLORIDE 0.9 % IV SOLN
20.0000 mg | Freq: Once | INTRAVENOUS | Status: AC
  Administered 2017-09-04: 20 mg via INTRAVENOUS
  Filled 2017-09-04: qty 2

## 2017-09-04 MED ORDER — PALONOSETRON HCL INJECTION 0.25 MG/5ML
INTRAVENOUS | Status: AC
Start: 2017-09-04 — End: 2017-09-04
  Filled 2017-09-04: qty 5

## 2017-09-04 MED ORDER — SODIUM CHLORIDE 0.9 % IV SOLN
Freq: Once | INTRAVENOUS | Status: AC
  Administered 2017-09-04: 10:00:00 via INTRAVENOUS

## 2017-09-04 MED ORDER — DIPHENHYDRAMINE HCL 50 MG/ML IJ SOLN
INTRAMUSCULAR | Status: AC
  Filled 2017-09-04: qty 1

## 2017-09-04 MED ORDER — SODIUM CHLORIDE 0.9 % IV SOLN
175.0000 mg/m2 | Freq: Once | INTRAVENOUS | Status: AC
  Administered 2017-09-04: 402 mg via INTRAVENOUS
  Filled 2017-09-04: qty 67

## 2017-09-04 NOTE — Telephone Encounter (Signed)
Left message for patient regarding upcoming April appointments per 3/1 sch message.

## 2017-09-04 NOTE — Assessment & Plan Note (Signed)
Her blood sugar is elevated induced by steroids I recommend increase water intake as tolerated and to monitor her blood sugar carefully

## 2017-09-04 NOTE — Progress Notes (Signed)
[place] OFFICE PROGRESS NOTE  Cheryl Melton Care Team: Cheryl Melton, No Pcp Per as PCP - General (General Practice)  ASSESSMENT & PLAN:  Ovarian cancer on right [facility]) She tolerated recent chemotherapy well except for hyperglycemia We would proceed with treatment today without dose adjustment For her final treatment in 3 weeks, the Cheryl Melton felt comfortable to proceed with chemotherapy as scheduled I plan to schedule CT scan to be done next month along with blood work. She will follow with her GYN oncologist to review test results  Type 2 diabetes mellitus without complication ([place]) Her blood sugar is elevated induced by steroids I recommend increase water intake as tolerated and to monitor her blood sugar carefully  Nodule of right lung She is not symptomatic I plan to repeat imaging study next month to follow   Orders Placed This Encounter  Procedures  . CT ABDOMEN PELVIS W CONTRAST    Standing Status:   Future    Standing Expiration Date:   09/05/2018    Order Specific Question:   If indicated for the ordered procedure, I authorize the administration of contrast media per Radiology protocol    Answer:   Yes    Order Specific Question:   Preferred imaging location?    Answer:   [facility]    Order Specific Question:   Radiology Contrast Protocol - do NOT remove file path    Answer:   \\[email or web address]  . CT CHEST W CONTRAST    Standing Status:   Future    Standing Expiration Date:   09/05/2018    Order Specific Question:   If indicated for the ordered procedure, I authorize the administration of contrast media per Radiology protocol    Answer:   Yes    Order Specific Question:   Preferred imaging location?    Answer:   [facility]    Order Specific Question:   Radiology Contrast Protocol - do NOT remove file path    Answer:   \\[email or web address]\CTProtocols.[email or web address]    INTERVAL HISTORY: Please see below for problem  oriented charting. She is seen prior to cycle 5 of chemotherapy She tolerated treatment well Denies peripheral neuropathy, nausea or vomiting No recent infection.  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       03/20/2017 Imaging    GYN ultrasound was performed this revealed a small fundus measuring 5 cm there are 2 small fibroids within the fundus with the largest measuring 1.3 cm Within the endometrium there is a small amount a fluid the endometrial wall was slightly thickened at 4.8 mm the ultrasound was consistent with a small endometrial polyp  The right adnexa measured 7.4 x 6.7 cm contained a both a cystic and solid mass with a small amount of shadowing This is consistent with a dermoid tumor the left ovary was 2.6 x 1.5 x 1.0 cm There is no other adnexal masses no free fluid within the abdomen no ascites          04/10/2017 Surgery    Operation: Operative Laparscopy with  Peritoneal biopsy 2 hysteroscopy D&C Surgeon: Lynder Parents MD  Specimens:  Peritoneal biopsy and cellular washings  Complications: None FIndings Small amount of ascites was found cellular washings were sent There was peritoneal studding And omental caking Even in steep Trendelenburg the ovaries and uterus were unable to be visualized due to the small intestines in the lower pelvis          04/10/2017 Pathology Results    Biopsy from [facility] was nondiagnostic.  Peritoneum biopsy did not reveal malignancy.  Endometrial biopsy also did not reveal malignancy.      04/13/2017 Tumor Marker    Cheryl Melton's tumor was tested for the following  markers: CA-125 Results of the tumor marker test revealed 324.1      04/20/2017 Tumor Marker    Cheryl Melton's tumor was tested for the following markers: CA-125 Results of the tumor marker test revealed 345.6      04/22/2017 Pathology Results    Peritoneum, biopsy - CARCINOMA. - SEE COMMENT.      04/22/2017 Procedure    CT-guided core biopsy performed of peritoneal tumor in the left lower quadrant.      04/24/2017 -  Chemotherapy    She received carboplatin and Taxol x 3 cycles followed by interval surgical debulking on 12/31. Chemotherapy was subsequently resumed for 3 more cycles      06/05/2017 Tumor Marker    Cheryl Melton's tumor was tested for the following markers: CA-125 Results of the tumor marker test revealed 27.5      06/17/2017 Imaging    1. Considerable improvement in the prior omental caking, which now presents mainly as an indistinct stranding with slight nodularity along the left upper quadrant omentum. 2. Stable appearance of right ovarian mass with fatty and calcific elements classic for a dermoid. 3. There is accentuated enhancement in the walls of the common hepatic duct and common bile duct. A low-grade cholangitis is not readily excluded. 4. Other imaging findings of potential clinical significance: Mild cardiomegaly. Aortic Atherosclerosis (ICD10-I70.0). Lumbar scoliosis, spondylosis, degenerative disc disease, and pars defects at L5. These cause impingement at L5-S1 and lesser impingement at L3-4 and L4-5. 5. 65% compression fracture at L1 slightly worsened than on the prior exam, with associated vertebral sclerosis and posterior retropulsion.      07/06/2017 Surgery    Bilateral Salpingo-Oophorectomy W/Omentectomy, Total Abd Hysterectomy & Radical Dissection For Debulking Right - Ureterolysis, With Or Without Repositioning Of Ureter For Retroperitoneal Fibrosis          07/06/2017 Pathology Results    A: Omentum, omentectomy - Omentum with minimal  residual carcinoma, consistent with high grade serous carcinoma (microscopic; stage [id number]) - Extensive treatment effect with calcifications, fibrosis, histiocytes, and giant cell response - CRS score 3  B: Uterus with cervix and left ovary and fallopian tube, hysterectomy and left salpingo-oophorectomy - Cervix: Ectocervix and endocervix with no dysplasia or malignancy identified - Endometrium: Inactive with no hyperplasia, atypia, or malignancy identified - Myometrium: Adenomyosis and benign leiomyoma, size 1.2 cm - Serosa: Focal calcification and histiocytes suggestive of treatment effect, with no viable carcinoma identified - Ovary, left: Physiologic changes and no carcinoma identified - Fallopian tube, left: 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 and no malignancy identified  C: Ovary and fallopian tube, right, salpingo-oophorectomy - Right fallopian tube with focal residual carcinoma, consistent with high grade serous carcinoma, and serous tubal intraepithelial carcinoma (STIC) - Associated calcifications and histiocytes suggestive of treatment response - Right ovary with calcifications and histiocytes suggestive of treatment effect, with no definite viable carcinoma identified -  Mature cystic teratoma, 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 - See synoptic report and comment      08/14/2017 Tumor Marker    Cheryl Melton's tumor was tested for the following markers: CA-125 Results of the tumor marker test revealed 14.6       REVIEW OF SYSTEMS:   Constitutional: Denies fevers, chills or abnormal weight loss Eyes: Denies blurriness of vision Ears, nose, mouth, throat, and face: Denies mucositis or sore throat Respiratory: Denies cough, dyspnea or wheezes Cardiovascular: Denies palpitation, chest discomfort or lower extremity swelling Gastrointestinal:  Denies nausea, heartburn or change in bowel habits Skin: Denies abnormal skin rashes Lymphatics: Denies new lymphadenopathy or easy  bruising Neurological:Denies numbness, tingling or new weaknesses Behavioral/Psych: Mood is stable, no new changes  All other systems were reviewed with the Cheryl Melton and are negative.  I have reviewed the past medical history, past surgical history, social history and family history with the Cheryl Melton and they are unchanged from previous note.  ALLERGIES:  is allergic to aspirin; nsaids; and tramadol.  MEDICATIONS:  Current Outpatient Medications  Medication Sig Dispense Refill  . acetaminophen (TYLENOL) 650 MG CR tablet Take 1,300 mg by mouth every 8 (eight) hours.     Marland Kitchen alendronate (FOSAMAX) 70 MG tablet Take 70 mg by mouth every Sunday.     Marland Kitchen atorvastatin (LIPITOR) 80 MG tablet Take 40 mg by mouth at bedtime.     . Cholecalciferol (VITAMIN D3) 2000 units TABS Take 2,000 Units by mouth daily.    . citalopram (CELEXA) 10 MG tablet Take 10 mg by mouth at bedtime    . dexamethasone (DECADRON) 4 MG tablet Take 3 tabs the night before chemo and 3 tabs in the morning of chemo with food, every 21 days (Cheryl Melton taking differently: Take 12 mg by mouth See admin instructions. Take 12 mg by mouth the night before chemo and 12 mg in the morning of chemo with food, every 21 days) 36 tablet 1  . gabapentin (NEURONTIN) 300 MG capsule Take by mouth.    . Glucosamine Sulfate 500 MG TABS Take 500 mg by mouth 2 (two) times daily.     Marland Kitchen linagliptin (TRADJENTA) 5 MG TABS tablet Take 5 mg by mouth daily.     Marland Kitchen loratadine (CLARITIN) 10 MG tablet Take 10 mg by mouth at bedtime.     Marland Kitchen losartan (COZAAR) 25 MG tablet Take 25 mg by mouth daily.     . meclizine (ANTIVERT) 25 MG tablet Take 25 mg by mouth 2 (two) times daily as needed for dizziness.     . metFORMIN (GLUCOPHAGE) 1000 MG tablet Take 1,000 mg by mouth 2 (two) times daily with a meal.     . omeprazole (PRILOSEC) 40 MG capsule Take 40 mg by mouth daily.     . ondansetron (ZOFRAN) 8 MG tablet Start on day 3 after chemo. (Cheryl Melton taking differently: Take 8 mg  by mouth See admin instructions. Take 8 mg by mouth as needed for nausea and vomiting, start on day 3 after chemo.) 30 tablet 1  . oxyCODONE 10 MG TABS Take 1 tablet (10 mg total) by mouth every 4 (four) hours as needed for severe pain. 30 tablet 0  . pioglitazone (ACTOS) 15 MG tablet Take 15 mg by mouth at bedtime.     . prochlorperazine (COMPAZINE) 10 MG tablet Take 1 tablet (10 mg total) by mouth every 6 (six) hours as needed (Nausea or vomiting). 30 tablet 1  . vitamin B-12 (CYANOCOBALAMIN) 1000 MCG tablet Take  1,000 mcg by mouth daily.      No current facility-administered medications for this visit.    Facility-Administered Medications Ordered in Other Visits  Medication Dose Route Frequency Provider Last Rate Last Dose  . CARBOplatin (PARAPLATIN) 750 mg in sodium chloride 0.9 % 250 mL chemo infusion  750 mg Intravenous Once [name], Kyah Buesing, MD      . Cold Pack 1 packet  1 packet Topical Once PRN [name], Daizee Firmin, MD      . PACLitaxel (TAXOL) 402 mg in sodium chloride 0.9 % 500 mL chemo infusion (> 50m/m2)  175 mg/m2 (Treatment Plan Recorded) Intravenous Once GHeath Lark MD 189 mL/hr at 09/04/17 1215 402 mg at 09/04/17 1215  . sodium chloride flush (NS) 0.9 % injection 10 mL  10 mL Intracatheter Once GAlvy Bimler Saintclair Schroader, MD        PHYSICAL EXAMINATION: ECOG PERFORMANCE STATUS: 0 - Asymptomatic  Vitals:   09/04/17 0946  BP: 132/78  Pulse: (!) 101  Resp: 18  Temp: 98 F (36.7 C)  SpO2: 98%   Filed Weights   09/04/17 0946  Weight: 235 lb 8 oz (106.8 kg)    GENERAL:alert, no distress and comfortable SKIN: skin color, texture, turgor are normal, no rashes or significant lesions EYES: normal, Conjunctiva are pink and non-injected, sclera clear OROPHARYNX:no exudate, no erythema and lips, buccal mucosa, and tongue normal  NECK: supple, thyroid normal size, non-tender, without nodularity LYMPH:  no palpable lymphadenopathy in the cervical, axillary or inguinal LUNGS: clear to auscultation and  percussion with normal breathing effort HEART: regular rate & rhythm and no murmurs and no lower extremity edema ABDOMEN:abdomen soft, non-tender and normal bowel sounds Musculoskeletal:no cyanosis of digits and no clubbing  NEURO: alert & oriented x 3 with fluent speech, no focal motor/sensory deficits  LABORATORY DATA:  I have reviewed the data as listed    Component Value Date/Time   NA 138 09/04/2017 0852   NA 139 06/05/2017 0746   K 4.3 09/04/2017 0852   K 4.1 06/05/2017 0746   CL 103 09/04/2017 0852   CO2 19 (L) 09/04/2017 0852   CO2 21 (L) 06/05/2017 0746   GLUCOSE 239 (H) 09/04/2017 0852   GLUCOSE 279 (H) 06/05/2017 0746   BUN 18 09/04/2017 0852   BUN 15.0 06/05/2017 0746   CREATININE 0.84 09/04/2017 0852   CREATININE 0.9 06/05/2017 0746   CALCIUM 9.4 09/04/2017 0852   CALCIUM 10.1 06/05/2017 0746   PROT 7.7 09/04/2017 0852   PROT 8.1 06/05/2017 0746   ALBUMIN 3.9 09/04/2017 0852   ALBUMIN 4.0 06/05/2017 0746   AST 16 09/04/2017 0852   AST 22 06/05/2017 0746   ALT 26 09/04/2017 0852   ALT 36 06/05/2017 0746   ALKPHOS 98 09/04/2017 0852   ALKPHOS 98 06/05/2017 0746   BILITOT 0.4 09/04/2017 0852   BILITOT 0.44 06/05/2017 0746   GFRNONAA >60 09/04/2017 0852   GFRAA >60 09/04/2017 0852    No results found for: SPEP, UPEP  Lab Results  Component Value Date   WBC 8.4 09/04/2017   NEUTROABS 7.7 (H) 09/04/2017   HGB 11.0 (L) 09/04/2017   HCT 35.0 09/04/2017   MCV 98.3 09/04/2017   PLT 209 09/04/2017      Chemistry      Component Value Date/Time   NA 138 09/04/2017 0852   NA 139 06/05/2017 0746   K 4.3 09/04/2017 0852   K 4.1 06/05/2017 0746   CL 103 09/04/2017 0852   CO2 19 (L) 09/04/2017  0852   CO2 21 (L) 06/05/2017 0746   BUN 18 09/04/2017 0852   BUN 15.0 06/05/2017 0746   CREATININE 0.84 09/04/2017 0852   CREATININE 0.9 06/05/2017 0746      Component Value Date/Time   CALCIUM 9.4 09/04/2017 0852   CALCIUM 10.1 06/05/2017 0746   ALKPHOS 98  09/04/2017 0852   ALKPHOS 98 06/05/2017 0746   AST 16 09/04/2017 0852   AST 22 06/05/2017 0746   ALT 26 09/04/2017 0852   ALT 36 06/05/2017 0746   BILITOT 0.4 09/04/2017 0852   BILITOT 0.44 06/05/2017 0746       All questions were answered. The Cheryl Melton knows to call the clinic with any problems, questions or concerns. No barriers to learning was detected.  I spent 15 minutes counseling the Cheryl Melton face to face. The total time spent in the appointment was 20 minutes and more than 50% was on counseling and review of test results  Heath Lark, MD 09/04/2017 12:46 PM

## 2017-09-04 NOTE — Assessment & Plan Note (Signed)
She tolerated recent chemotherapy well except for hyperglycemia We would proceed with treatment today without dose adjustment For her final treatment in 3 weeks, the patient felt comfortable to proceed with chemotherapy as scheduled I plan to schedule CT scan to be done next month along with blood work. She will follow with her GYN oncologist to review test results

## 2017-09-04 NOTE — Assessment & Plan Note (Signed)
She is not symptomatic I plan to repeat imaging study next month to follow

## 2017-09-04 NOTE — Telephone Encounter (Signed)
Gave patient AVs and calendar of upcoming march appointments.

## 2017-09-04 NOTE — Patient Instructions (Signed)
[facility] Discharge Instructions for Patients Receiving Chemotherapy  Today you received the following chemotherapy agents Carboplatin and taxol  To help prevent nausea and vomiting after your treatment, we encourage you to take your nausea medication as directed If you develop nausea and vomiting that is not controlled by your nausea medication, call the clinic.   BELOW ARE SYMPTOMS THAT SHOULD BE REPORTED IMMEDIATELY:  *FEVER GREATER THAN 100.5 F  *CHILLS WITH OR WITHOUT FEVER  NAUSEA AND VOMITING THAT IS NOT CONTROLLED WITH YOUR NAUSEA MEDICATION  *UNUSUAL SHORTNESS OF BREATH  *UNUSUAL BRUISING OR BLEEDING  TENDERNESS IN MOUTH AND THROAT WITH OR WITHOUT PRESENCE OF ULCERS  *URINARY PROBLEMS  *BOWEL PROBLEMS  UNUSUAL RASH Items with * indicate a potential emergency and should be followed up as soon as possible.  Feel free to call the clinic should you have any questions or concerns. The clinic phone number is [phone].  Please show the CHEMO ALERT CARD at check-in to the Emergency Department and triage nurse.   

## 2017-09-05 LAB — CA 125: Cancer Antigen (CA) 125: 12.5 U/mL (ref 0.0–38.1)

## 2017-09-07 ENCOUNTER — Inpatient Hospital Stay: Payer: Medicare Other

## 2017-09-07 VITALS — BP 145/77 | HR 107 | Temp 98.0°F | Resp 20

## 2017-09-07 DIAGNOSIS — Z5111 Encounter for antineoplastic chemotherapy: Secondary | ICD-10-CM | POA: Diagnosis not present

## 2017-09-07 DIAGNOSIS — C561 Malignant neoplasm of right ovary: Secondary | ICD-10-CM

## 2017-09-07 MED ORDER — PEGFILGRASTIM INJECTION 6 MG/0.6ML ~~LOC~~
6.0000 mg | PREFILLED_SYRINGE | Freq: Once | SUBCUTANEOUS | Status: AC
  Administered 2017-09-07: 6 mg via SUBCUTANEOUS

## 2017-09-07 NOTE — Patient Instructions (Signed)
Pegfilgrastim injection What is this medicine? PEGFILGRASTIM (PEG fil gra stim) is a long-acting granulocyte colony-stimulating factor that stimulates the growth of neutrophils, a type of white blood cell important in the body's fight against infection. It is used to reduce the incidence of fever and infection in patients with certain types of cancer who are receiving chemotherapy that affects the bone marrow, and to increase survival after being exposed to high doses of radiation. This medicine may be used for other purposes; ask your health care provider or pharmacist if you have questions. COMMON BRAND NAME(S): Neulasta What should I tell my health care provider before I take this medicine? They need to know if you have any of these conditions: -kidney disease -latex allergy -ongoing radiation therapy -sickle cell disease -skin reactions to acrylic adhesives (On-Body Injector only) -an unusual or allergic reaction to pegfilgrastim, filgrastim, other medicines, foods, dyes, or preservatives -pregnant or trying to get pregnant -breast-feeding How should I use this medicine? This medicine is for injection under the skin. If you get this medicine at home, you will be taught how to prepare and give the pre-filled syringe or how to use the On-body Injector. Refer to the patient Instructions for Use for detailed instructions. Use exactly as directed. Tell your healthcare provider immediately if you suspect that the On-body Injector may not have performed as intended or if you suspect the use of the On-body Injector resulted in a missed or partial dose. It is important that you put your used needles and syringes in a special sharps container. Do not put them in a trash can. If you do not have a sharps container, call your pharmacist or healthcare provider to get one. Talk to your pediatrician regarding the use of this medicine in children. While this drug may be prescribed for selected conditions,  precautions do apply. Overdosage: If you think you have taken too much of this medicine contact a poison control center or emergency room at once. NOTE: This medicine is only for you. Do not share this medicine with others. What if I miss a dose? It is important not to miss your dose. Call your doctor or health care professional if you miss your dose. If you miss a dose due to an On-body Injector failure or leakage, a new dose should be administered as soon as possible using a single prefilled syringe for manual use. What may interact with this medicine? Interactions have not been studied. Give your health care provider a list of all the medicines, herbs, non-prescription drugs, or dietary supplements you use. Also tell them if you smoke, drink alcohol, or use illegal drugs. Some items may interact with your medicine. This list may not describe all possible interactions. Give your health care provider a list of all the medicines, herbs, non-prescription drugs, or dietary supplements you use. Also tell them if you smoke, drink alcohol, or use illegal drugs. Some items may interact with your medicine. What should I watch for while using this medicine? You may need blood work done while you are taking this medicine. If you are going to need a MRI, CT scan, or other procedure, tell your doctor that you are using this medicine (On-Body Injector only). What side effects may I notice from receiving this medicine? Side effects that you should report to your doctor or health care professional as soon as possible: -allergic reactions like skin rash, itching or hives, swelling of the face, lips, or tongue -dizziness -fever -pain, redness, or irritation at site   where injected -pinpoint red spots on the skin -red or dark-brown urine -shortness of breath or breathing problems -stomach or side pain, or pain at the shoulder -swelling -tiredness -trouble passing urine or change in the amount of urine Side  effects that usually do not require medical attention (report to your doctor or health care professional if they continue or are bothersome): -bone pain -muscle pain This list may not describe all possible side effects. Call your doctor for medical advice about side effects. You may report side effects to FDA at 1-800-FDA-1088. Where should I keep my medicine? Keep out of the reach of children. Store pre-filled syringes in a refrigerator between 2 and 8 degrees C (36 and 46 degrees F). Do not freeze. Keep in carton to protect from light. Throw away this medicine if it is left out of the refrigerator for more than 48 hours. Throw away any unused medicine after the expiration date. NOTE: This sheet is a summary. It may not cover all possible information. If you have questions about this medicine, talk to your doctor, pharmacist, or health care provider.  2018 Elsevier/Gold Standard (2016-06-19 12:58:03)  

## 2017-09-08 ENCOUNTER — Telehealth: Payer: Self-pay | Admitting: *Deleted

## 2017-09-08 NOTE — Telephone Encounter (Signed)
Called and gave the patient the new appt for genetics on March 12th.

## 2017-09-15 ENCOUNTER — Inpatient Hospital Stay (HOSPITAL_BASED_OUTPATIENT_CLINIC_OR_DEPARTMENT_OTHER): Payer: Medicare Other | Admitting: Genetics

## 2017-09-15 ENCOUNTER — Other Ambulatory Visit: Payer: Medicare Other

## 2017-09-15 ENCOUNTER — Encounter: Payer: Medicare Other | Admitting: Genetics

## 2017-09-15 DIAGNOSIS — C561 Malignant neoplasm of right ovary: Secondary | ICD-10-CM | POA: Diagnosis present

## 2017-09-15 DIAGNOSIS — Z7183 Encounter for nonprocreative genetic counseling: Secondary | ICD-10-CM

## 2017-09-15 DIAGNOSIS — Z800 Family history of malignant neoplasm of digestive organs: Secondary | ICD-10-CM | POA: Diagnosis not present

## 2017-09-15 DIAGNOSIS — C569 Malignant neoplasm of unspecified ovary: Secondary | ICD-10-CM

## 2017-09-15 DIAGNOSIS — Z8042 Family history of malignant neoplasm of prostate: Secondary | ICD-10-CM

## 2017-09-16 ENCOUNTER — Encounter: Payer: Self-pay | Admitting: Genetics

## 2017-09-16 DIAGNOSIS — Z8042 Family history of malignant neoplasm of prostate: Secondary | ICD-10-CM | POA: Insufficient documentation

## 2017-09-16 DIAGNOSIS — Z800 Family history of malignant neoplasm of digestive organs: Secondary | ICD-10-CM | POA: Insufficient documentation

## 2017-09-16 NOTE — Progress Notes (Signed)
REFERRING PROVIDER: Heath Lark, MD [place], [place] [address]  PRIMARY PROVIDER:  Patient, No Pcp Per  PRIMARY REASON FOR VISIT:  1. 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 Family history of colon cancer   3. Family history of prostate cancer   4. Malignant neoplasm of ovary, unspecified laterality ([place])      HISTORY OF PRESENT ILLNESS:   Ms. Oki, a 67 y.o. female, was seen for a [facility] cancer genetics consultation at the request of Dr. [name] due to a personal and family history of cancer.  Ms. Graul presents to clinic today to discuss the possibility of a hereditary predisposition to cancer, genetic testing, and to further clarify her future cancer risks, as well as potential cancer risks for family members.   In Oct 2018, at the age of 24, Ms. Shere underwent peritoneal biopsy that revealed carcinoma. She was diagnosed with right ovarian cancer .  She had bilateral Salpingo-oophorectomy with omentectomy, total abd hysterectomy and radical dissection for debulking on 12/32/2018.  She is undergoing chemotherapy.    Ms. Losurdo had genetic analysis performed on her tumor at [facility].   BRCA1 immunohistochemistry staining through [facility] revealed BRCA1 is retained in the tumor cells.  BRCA1 mutation analysis did not identify a genetic mutation.   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       03/20/2017 Imaging    GYN ultrasound was performed this revealed a small fundus measuring 5 cm there are 2 small fibroids within the fundus with the largest measuring 1.3 cm Within the endometrium there is a small amount a fluid the endometrial  wall was slightly thickened at 4.8 mm the ultrasound was consistent with a small endometrial polyp  The right adnexa measured 7.4 x 6.7 cm contained a both a cystic and solid mass with a small amount of shadowing This is consistent with a dermoid tumor the left ovary was 2.6 x 1.5 x 1.0 cm There is no other adnexal masses no free fluid within the abdomen no ascites          04/10/2017 Surgery    Operation: Operative Laparscopy with  Peritoneal biopsy 2 hysteroscopy D&C Surgeon: Lynder Parents MD  Specimens: Peritoneal biopsy and cellular washings  Complications: None FIndings Small amount of ascites was found cellular washings were sent There was peritoneal studding And omental caking Even in steep Trendelenburg the ovaries and uterus were unable to be visualized due to the small intestines in the lower pelvis          04/10/2017 Pathology Results    Biopsy from [facility] was nondiagnostic.  Peritoneum biopsy did not reveal malignancy.  Endometrial biopsy also did not reveal malignancy.      04/13/2017 Tumor Marker    Cheryl Melton's tumor was tested for the following markers: CA-125 Results of the tumor marker test revealed 324.1      04/20/2017 Tumor Marker    Cheryl Melton's tumor was tested for the following markers: CA-125 Results of the tumor marker test revealed 345.6      04/22/2017 Pathology Results    Peritoneum, biopsy -  CARCINOMA. - SEE COMMENT.      04/22/2017 Procedure    CT-guided core biopsy performed of peritoneal tumor in the left lower quadrant.      04/24/2017 -  Chemotherapy    She received carboplatin and Taxol x 3 cycles followed by interval surgical debulking on 12/31. Chemotherapy was subsequently resumed for 3 more cycles      06/05/2017 Tumor Marker    Cheryl Melton's tumor was tested for the following markers: CA-125 Results of the tumor marker test revealed 27.5      06/17/2017 Imaging    1. Considerable improvement in  the prior omental caking, which now presents mainly as an indistinct stranding with slight nodularity along the left upper quadrant omentum. 2. Stable appearance of right ovarian mass with fatty and calcific elements classic for a dermoid. 3. There is accentuated enhancement in the walls of the common hepatic duct and common bile duct. A low-grade cholangitis is not readily excluded. 4. Other imaging findings of potential clinical significance: Mild cardiomegaly. Aortic Atherosclerosis (ICD10-I70.0). Lumbar scoliosis, spondylosis, degenerative disc disease, and pars defects at L5. These cause impingement at L5-S1 and lesser impingement at L3-4 and L4-5. 5. 65% compression fracture at L1 slightly worsened than on the prior exam, with associated vertebral sclerosis and posterior retropulsion.      07/06/2017 Surgery    Bilateral Salpingo-Oophorectomy W/Omentectomy, Total Abd Hysterectomy & Radical Dissection For Debulking Right - Ureterolysis, With Or Without Repositioning Of Ureter For Retroperitoneal Fibrosis          07/06/2017 Pathology Results    A: Omentum, omentectomy - Omentum with minimal residual carcinoma, consistent with high grade serous carcinoma (microscopic; stage ypT3a) - Extensive treatment effect with calcifications, fibrosis, histiocytes, and giant cell response - CRS score 3  B: Uterus with cervix and left ovary and fallopian tube, hysterectomy and left salpingo-oophorectomy - Cervix: Ectocervix and endocervix with no dysplasia or malignancy identified - Endometrium: Inactive with no hyperplasia, atypia, or malignancy identified - Myometrium: Adenomyosis and benign leiomyoma, size 1.2 cm - Serosa: Focal calcification and histiocytes suggestive of treatment effect, with no viable carcinoma identified - Ovary, left: Physiologic changes and no carcinoma identified - Fallopian tube, left: 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 and no malignancy identified  C:  Ovary and fallopian tube, right, salpingo-oophorectomy - Right fallopian tube with focal residual carcinoma, consistent with high grade serous carcinoma, and serous tubal intraepithelial carcinoma (STIC) - Associated calcifications and histiocytes suggestive of treatment response - Right ovary with calcifications and histiocytes suggestive of treatment effect, with no definite viable carcinoma identified - Mature cystic teratoma, 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  First live birth at age N/A.  Ovaries intact: no.  Hysterectomy: yes.  Menopausal status: postmenopausal.  HRT use: 0 years. Colonoscopy: yes; reports 1 polyp, goes every 5 years. Mammogram within the last year: yes.   Past Medical History:  Diagnosis Date  . Anxiety   . Cancer ([place])   . Diabetes mellitus without complication ([place])   . Family history of colon cancer   .  Family history of prostate cancer   . GERD (gastroesophageal reflux disease)   . Osteoporosis   . Parathyroid adenoma     Past Surgical History:  Procedure Laterality Date  . CHOLECYSTECTOMY    . COLONOSCOPY WITH ESOPHAGOGASTRODUODENOSCOPY (EGD)    . DILATION AND CURETTAGE OF UTERUS    . ELBOW SURGERY    . PARATHYROIDECTOMY  10/21/2016   One side done pt not sure which side.   Marland Kitchen ROTATOR CUFF REPAIR Right   . TONSILLECTOMY      Social History   Socioeconomic History  . Marital status: Married    Spouse name: Mikeal Hawthorne  . Number of children: 0  . Years of education: None  . Highest education level: None  Social Needs  . Financial resource strain: None  . Food insecurity - worry: None  . Food  insecurity - inability: None  . Transportation needs - medical: None  . Transportation needs - non-medical: None  Occupational History  . Occupation: retired  Tobacco Use  . Smoking status: Never Smoker  . Smokeless tobacco: Never Used  Substance and Sexual Activity  . Alcohol use: No  . Drug use: No  . Sexual activity: None  Other Topics Concern  . None  Social History Narrative  . None     FAMILY HISTORY:  We obtained a detailed, 4-generation family history.  Significant diagnoses are listed below: Family History  Problem Relation Age of Onset  . Colon cancer Mother 89  . Prostate cancer Father 86       metastatic  . Colon cancer Paternal Uncle        age dx unk  . Heart attack Maternal Grandmother   . Aneurysm Maternal Grandfather   . Alzheimer's disease Paternal Grandmother   . Lung cancer Paternal Uncle   . Heart attack Paternal Uncle    Ms. Fournier has 2 sisters and 2 brothers.  -1 sister is 37 with a history of Squamous cell skin cancer.  She had her ovaries removed.  She has no children. -1 sister is in her 61's with a history of 5 skin cancers Squamous cell and basal cell.  She had her ovaries removed and has no children.  -1 brother died in his 42's due to heart disease.  -1 brother is 39 with no history of cancer.   Ms. Dettmann father died at 42 due to metastatic prostate cancer that was diagnosed at 49.  Mr. Crespin has 3 paternal uncles.  One died of a heart attack.  One uncle had colon cancer, and another uncle had lung cancer.  She has limited information about her paternal relatives, but as far as she is aware no paternal cousins with any history of cancer.  Ms. Petti paternal grandfather died in a fire and her paternal grandmother died due to Alzheimer's disease.    Ms. Burpee mother died at 2.  She had a history of colon cancer that was diagnsoed at 79.  Ms. Lazo had 4 maternal aunts who are deceased and 2 maternal uncles who are deceased.  No history  of cancer in these relatives.  No history of cancer reported in her maternal cousins. Ms. Diskin maternal grandfather died due to an aneurysm in his late 85's, and Ms. Gullion's maternal grandmother died in her 23's/80's due to a heart attack.   Ms. Doland is unaware of previous family history of genetic testing for hereditary cancer risks. Cheryl Melton's maternal ancestors are of [place] descent, and paternal ancestors are of  English descent. There is no reported Ashkenazi Jewish ancestry. There is no known consanguinity.  GENETIC COUNSELING ASSESSMENT: Rhylee Pucillo is a 67 y.o. female with a personal and family history which is somewhat suggestive of a Hereditary Cancer Predisposition Syndrome. We, therefore, discussed and recommended the following at today's visit.   DISCUSSION: We reviewed the characteristics, features and inheritance patterns of hereditary cancer syndromes. We also discussed genetic testing, including the appropriate family members to test, the process of testing, insurance coverage and turn-around-time for results. We discussed the implications of a negative, positive and/or variant of uncertain significant result.   We discussed that germline genetic testing will test for hereditary mutations that could explain her diagnosis of cancer. However, homologous recombination testing (HRD) is genetic testing performed on her tumor that can determine genetic changes that could influence her management.  HRD testing is performed in tandem with genetic testing, and typically at no additional cost. We recommended Ms. Kipper pursue germline genetic testing and Tumor HRD testing.   Despite our recommendation, Ms. Fetters declined testing of her tumor for at this time.  However, she states she would like to proceed with germline genetic testing.   Therefore we recommended Ms. Botkin pursue genetic testing for the Common Hereditary Cancers gene panel. The Common Hereditary Cancer Panel offered by  [facility] includes sequencing and/or deletion duplication testing of the following 47 genes: APC, ATM, AXIN2, BARD1, BMPR1A, BRCA1, BRCA2, BRIP1, CDH1, CDKN2A (p14ARF), CDKN2A (p16INK4a), CKD4, CHEK2, CTNNA1, DICER1, EPCAM (Deletion/duplication testing only), GREM1 (promoter region deletion/duplication testing only), KIT, MEN1, MLH1, MSH2, MSH3, MSH6, MUTYH, NBN, NF1, NHTL1, PALB2, PDGFRA, PMS2, POLD1, POLE, PTEN, RAD50, RAD51C, RAD51D, SDHB, SDHC, SDHD, SMAD4, SMARCA4. STK11, TP53, TSC1, TSC2, and VHL.  The following genes were evaluated for sequence changes only: SDHA and HOXB13 c.251G>A variant only.  We discussed that about 20-25% of ovarian cancers are associated with a Hereditary cancer predisposition syndrome.  One of the most common hereditary cancer syndromes that increases breast cancer risk is called Hereditary Breast and Ovarian Cancer (HBOC) syndrome.  This syndrome is caused by mutations in the BRCA1 and BRCA2 genes.  This syndrome increases an individual's lifetime risk to develop breast, ovarian, pancreatic, and other types of cancer.  There are also many other cancer predisposition syndromes caused by mutations in several other genes.  We discussed that if she is found to have a mutation in one of these genes, it may impact surgical decisions, and alter future medical management recommendations such as increased cancer screenings and consideration of risk reducing surgeries.  A positive result could also have implications for the Cheryl Melton's family members.  A Negative result would mean we were unable to identify a hereditary component to her cancer, but does not rule out the possibility of a hereditary basis for her cancer.  There could be mutations that are undetectable by current technology, or in genes not yet tested or identified to increase cancer risk.    We discussed the potential to find a Variant of Uncertain Significance or VUS.  These are variants that have not yet been identified as  pathogenic or benign, and it is unknown if this variant is associated with increased cancer risk or if this is a normal finding.  Most VUS's are reclassified to benign or likely benign.   It should not be used to make medical management decisions. With time, we suspect the lab will determine the significance of any VUS's identified if any.   Based on Ms. Needham's personal  and family history of cancer, she meets medical criteria for genetic testing. Despite that she meets criteria, she may still have an out of pocket cost. We discussed that if her out of pocket cost for testing is over $100, the laboratory will call and confirm whether she wants to proceed with testing.  If the out of pocket cost of testing is less than $100 she will be billed by the genetic testing laboratory.    PLAN: After considering the risks, benefits, and limitations, Ms. Brimage  provided informed consent to pursue genetic testing and the blood sample was sent to [facility] for analysis of the Common Hereditary Cancers Panel. Results should be available within approximately 2-3 weeks' time, at which point they will be disclosed by telephone to Ms. Troung, as will any additional recommendations warranted by these results. Ms. Giza will receive a summary of her genetic counseling visit and a copy of her results once available. This information will also be available in Epic. We encouraged Ms. Mumpower to remain in contact with cancer genetics annually so that we can continuously update the family history and inform her of any changes in cancer genetics and testing that may be of benefit for her family. Ms. Cashin questions were answered to her satisfaction today. Our contact information was provided should additional questions or concerns arise.  Based on Ms. Farone family history, we recommended her paternal relatives  have genetic counseling and testing. Ms. Faidley will let us know if we can be of any assistance in  coordinating genetic counseling and/or testing for this family member.   Lastly, we encouraged Ms. Meixner to remain in contact with cancer genetics annually so that we can continuously update the family history and inform her of any changes in cancer genetics and testing that may be of benefit for this family.   Ms.  Oconnor questions were answered to her satisfaction today. Our contact information was provided should additional questions or concerns arise. Thank you for the referral and allowing [place] to share in the care of your Cheryl Melton.   Tana Felts, MS, [facility] Certified Genetic Counselor [email or web address] phone: [phone]  The Cheryl Melton was seen for a total of 50 minutes in face-to-face genetic counseling.  The Cheryl Melton was accompanied today by her 2 sisters.  Intern Murriel Hopper was present and observed this session.

## 2017-09-24 ENCOUNTER — Other Ambulatory Visit: Payer: Self-pay | Admitting: Hematology and Oncology

## 2017-09-24 DIAGNOSIS — C569 Malignant neoplasm of unspecified ovary: Secondary | ICD-10-CM

## 2017-09-25 ENCOUNTER — Telehealth: Payer: Self-pay | Admitting: *Deleted

## 2017-09-25 ENCOUNTER — Inpatient Hospital Stay: Payer: Medicare Other

## 2017-09-25 DIAGNOSIS — Z5111 Encounter for antineoplastic chemotherapy: Secondary | ICD-10-CM | POA: Diagnosis not present

## 2017-09-25 DIAGNOSIS — C561 Malignant neoplasm of right ovary: Secondary | ICD-10-CM

## 2017-09-25 DIAGNOSIS — C569 Malignant neoplasm of unspecified ovary: Secondary | ICD-10-CM

## 2017-09-25 LAB — CBC WITH DIFFERENTIAL/PLATELET
Basophils Absolute: 0 10*3/uL (ref 0.0–0.1)
Basophils Relative: 0 %
EOS PCT: 0 %
Eosinophils Absolute: 0 10*3/uL (ref 0.0–0.5)
HEMATOCRIT: 30.8 % — AB (ref 34.8–46.6)
Hemoglobin: 10 g/dL — ABNORMAL LOW (ref 11.6–15.9)
LYMPHS PCT: 6 %
Lymphs Abs: 0.4 10*3/uL — ABNORMAL LOW (ref 0.9–3.3)
MCH: 31.6 pg (ref 25.1–34.0)
MCHC: 32.6 g/dL (ref 31.5–36.0)
MCV: 96.9 fL (ref 79.5–101.0)
MONO ABS: 0.1 10*3/uL (ref 0.1–0.9)
MONOS PCT: 1 %
NEUTROS ABS: 7 10*3/uL — AB (ref 1.5–6.5)
Neutrophils Relative %: 93 %
Platelets: 178 10*3/uL (ref 145–400)
RBC: 3.18 MIL/uL — ABNORMAL LOW (ref 3.70–5.45)
RDW: 19 % — AB (ref 11.2–14.5)
WBC: 7.5 10*3/uL (ref 3.9–10.3)

## 2017-09-25 LAB — COMPREHENSIVE METABOLIC PANEL
ALBUMIN: 3.9 g/dL (ref 3.5–5.0)
ALK PHOS: 101 U/L (ref 40–150)
ALT: 25 U/L (ref 0–55)
ANION GAP: 15 — AB (ref 3–11)
AST: 18 U/L (ref 5–34)
BUN: 14 mg/dL (ref 7–26)
CO2: 21 mmol/L — AB (ref 22–29)
Calcium: 8.9 mg/dL (ref 8.4–10.4)
Chloride: 103 mmol/L (ref 98–109)
Creatinine, Ser: 0.87 mg/dL (ref 0.60–1.10)
GFR calc Af Amer: >60 mL/min (ref >60)
GFR calc non Af Amer: >60 mL/min (ref >60)
GLUCOSE: 235 mg/dL — AB (ref 70–140)
Potassium: 3.7 mmol/L (ref 3.5–5.1)
SODIUM: 139 mmol/L (ref 136–145)
Total Bilirubin: 0.5 mg/dL (ref 0.2–1.2)
Total Protein: 7.5 g/dL (ref 6.4–8.3)

## 2017-09-25 MED ORDER — PALONOSETRON HCL INJECTION 0.25 MG/5ML
0.2500 mg | Freq: Once | INTRAVENOUS | Status: AC
  Administered 2017-09-25: 0.25 mg via INTRAVENOUS

## 2017-09-25 MED ORDER — DIPHENHYDRAMINE HCL 50 MG/ML IJ SOLN
INTRAMUSCULAR | Status: AC
  Filled 2017-09-25: qty 1

## 2017-09-25 MED ORDER — DIPHENHYDRAMINE HCL 50 MG/ML IJ SOLN
50.0000 mg | Freq: Once | INTRAMUSCULAR | Status: AC
  Administered 2017-09-25: 50 mg via INTRAVENOUS

## 2017-09-25 MED ORDER — FAMOTIDINE IN NACL 20-0.9 MG/50ML-% IV SOLN
INTRAVENOUS | Status: AC
  Filled 2017-09-25: qty 50

## 2017-09-25 MED ORDER — SODIUM CHLORIDE 0.9 % IV SOLN
Freq: Once | INTRAVENOUS | Status: AC
  Administered 2017-09-25: 11:00:00 via INTRAVENOUS

## 2017-09-25 MED ORDER — SODIUM CHLORIDE 0.9 % IV SOLN
748.2000 mg | Freq: Once | INTRAVENOUS | Status: AC
  Administered 2017-09-25: 750 mg via INTRAVENOUS
  Filled 2017-09-25: qty 75

## 2017-09-25 MED ORDER — PALONOSETRON HCL INJECTION 0.25 MG/5ML
INTRAVENOUS | Status: AC
  Filled 2017-09-25: qty 5

## 2017-09-25 MED ORDER — SODIUM CHLORIDE 0.9 % IV SOLN
175.0000 mg/m2 | Freq: Once | INTRAVENOUS | Status: AC
  Administered 2017-09-25: 402 mg via INTRAVENOUS
  Filled 2017-09-25: qty 67

## 2017-09-25 MED ORDER — FAMOTIDINE IN NACL 20-0.9 MG/50ML-% IV SOLN
20.0000 mg | Freq: Once | INTRAVENOUS | Status: AC
  Administered 2017-09-25: 20 mg via INTRAVENOUS

## 2017-09-25 MED ORDER — SODIUM CHLORIDE 0.9 % IV SOLN
Freq: Once | INTRAVENOUS | Status: AC
  Administered 2017-09-25: 11:00:00 via INTRAVENOUS
  Filled 2017-09-25: qty 5

## 2017-09-25 NOTE — Patient Instructions (Signed)
Cancer Center Discharge Instructions for Patients Receiving Chemotherapy  Today you received the following chemotherapy agents:  Taxol, Carboplatin  To help prevent nausea and vomiting after your treatment, we encourage you to take your nausea medication as prescribed.   If you develop nausea and vomiting that is not controlled by your nausea medication, call the clinic.   BELOW ARE SYMPTOMS THAT SHOULD BE REPORTED IMMEDIATELY:  *FEVER GREATER THAN 100.5 F  *CHILLS WITH OR WITHOUT FEVER  NAUSEA AND VOMITING THAT IS NOT CONTROLLED WITH YOUR NAUSEA MEDICATION  *UNUSUAL SHORTNESS OF BREATH  *UNUSUAL BRUISING OR BLEEDING  TENDERNESS IN MOUTH AND THROAT WITH OR WITHOUT PRESENCE OF ULCERS  *URINARY PROBLEMS  *BOWEL PROBLEMS  UNUSUAL RASH Items with * indicate a potential emergency and should be followed up as soon as possible.  Feel free to call the clinic should you have any questions or concerns. The clinic phone number is [phone].  Please show the CHEMO ALERT CARD at check-in to the Emergency Department and triage nurse.   

## 2017-09-25 NOTE — Telephone Encounter (Signed)
Called and scheduled CT scan for patient. Appt on Arpil 24th at [place] the patient instructions and date/time

## 2017-09-28 ENCOUNTER — Inpatient Hospital Stay: Payer: Medicare Other

## 2017-09-28 VITALS — BP 140/80 | HR 100 | Temp 98.0°F | Resp 18

## 2017-09-28 DIAGNOSIS — C561 Malignant neoplasm of right ovary: Secondary | ICD-10-CM

## 2017-09-28 DIAGNOSIS — Z5111 Encounter for antineoplastic chemotherapy: Secondary | ICD-10-CM | POA: Diagnosis not present

## 2017-09-28 MED ORDER — PEGFILGRASTIM INJECTION 6 MG/0.6ML ~~LOC~~
6.0000 mg | PREFILLED_SYRINGE | Freq: Once | SUBCUTANEOUS | Status: AC
  Administered 2017-09-28: 6 mg via SUBCUTANEOUS

## 2017-09-28 NOTE — Patient Instructions (Signed)
Pegfilgrastim injection What is this medicine? PEGFILGRASTIM (PEG fil gra stim) is a long-acting granulocyte colony-stimulating factor that stimulates the growth of neutrophils, a type of white blood cell important in the body's fight against infection. It is used to reduce the incidence of fever and infection in patients with certain types of cancer who are receiving chemotherapy that affects the bone marrow, and to increase survival after being exposed to high doses of radiation. This medicine may be used for other purposes; ask your health care provider or pharmacist if you have questions. COMMON BRAND NAME(S): Neulasta What should I tell my health care provider before I take this medicine? They need to know if you have any of these conditions: -kidney disease -latex allergy -ongoing radiation therapy -sickle cell disease -skin reactions to acrylic adhesives (On-Body Injector only) -an unusual or allergic reaction to pegfilgrastim, filgrastim, other medicines, foods, dyes, or preservatives -pregnant or trying to get pregnant -breast-feeding How should I use this medicine? This medicine is for injection under the skin. If you get this medicine at home, you will be taught how to prepare and give the pre-filled syringe or how to use the On-body Injector. Refer to the patient Instructions for Use for detailed instructions. Use exactly as directed. Tell your healthcare provider immediately if you suspect that the On-body Injector may not have performed as intended or if you suspect the use of the On-body Injector resulted in a missed or partial dose. It is important that you put your used needles and syringes in a special sharps container. Do not put them in a trash can. If you do not have a sharps container, call your pharmacist or healthcare provider to get one. Talk to your pediatrician regarding the use of this medicine in children. While this drug may be prescribed for selected conditions,  precautions do apply. Overdosage: If you think you have taken too much of this medicine contact a poison control center or emergency room at once. NOTE: This medicine is only for you. Do not share this medicine with others. What if I miss a dose? It is important not to miss your dose. Call your doctor or health care professional if you miss your dose. If you miss a dose due to an On-body Injector failure or leakage, a new dose should be administered as soon as possible using a single prefilled syringe for manual use. What may interact with this medicine? Interactions have not been studied. Give your health care provider a list of all the medicines, herbs, non-prescription drugs, or dietary supplements you use. Also tell them if you smoke, drink alcohol, or use illegal drugs. Some items may interact with your medicine. This list may not describe all possible interactions. Give your health care provider a list of all the medicines, herbs, non-prescription drugs, or dietary supplements you use. Also tell them if you smoke, drink alcohol, or use illegal drugs. Some items may interact with your medicine. What should I watch for while using this medicine? You may need blood work done while you are taking this medicine. If you are going to need a MRI, CT scan, or other procedure, tell your doctor that you are using this medicine (On-Body Injector only). What side effects may I notice from receiving this medicine? Side effects that you should report to your doctor or health care professional as soon as possible: -allergic reactions like skin rash, itching or hives, swelling of the face, lips, or tongue -dizziness -fever -pain, redness, or irritation at site   where injected -pinpoint red spots on the skin -red or dark-brown urine -shortness of breath or breathing problems -stomach or side pain, or pain at the shoulder -swelling -tiredness -trouble passing urine or change in the amount of urine Side  effects that usually do not require medical attention (report to your doctor or health care professional if they continue or are bothersome): -bone pain -muscle pain This list may not describe all possible side effects. Call your doctor for medical advice about side effects. You may report side effects to FDA at 1-800-FDA-1088. Where should I keep my medicine? Keep out of the reach of children. Store pre-filled syringes in a refrigerator between 2 and 8 degrees C (36 and 46 degrees F). Do not freeze. Keep in carton to protect from light. Throw away this medicine if it is left out of the refrigerator for more than 48 hours. Throw away any unused medicine after the expiration date. NOTE: This sheet is a summary. It may not cover all possible information. If you have questions about this medicine, talk to your doctor, pharmacist, or health care provider.  2018 Elsevier/Gold Standard (2016-06-19 12:58:03)  

## 2017-10-02 ENCOUNTER — Other Ambulatory Visit: Payer: Self-pay

## 2017-10-02 DIAGNOSIS — C569 Malignant neoplasm of unspecified ovary: Secondary | ICD-10-CM

## 2017-10-02 MED ORDER — ONDANSETRON HCL 8 MG PO TABS: 8.0000 mg | ORAL_TABLET | ORAL | 1 refills | Status: DC

## 2017-10-07 ENCOUNTER — Telehealth: Payer: Self-pay | Admitting: Genetics

## 2017-10-07 NOTE — Telephone Encounter (Signed)
Revealed negative genetic testing. We discussed that did not find a genetic cause for her ovarian cancer or the cancer in the family.  While reassuring, It could still be due to a different gene that we are not testing, or maybe our current technology may not be able to pick something up.  It will be important for her to keep in contact with genetics to learn if additional testing may be needed in the future.  We ms. Melnyk and her siblings recommended her siblings all have colonoscopies every 5 years or as directed by their doctors.  We recommended all relatives alert their doctors about the family history of cancer so they can make the best screening recommendations for them. Siblings and paternal relatives appear to meet medical criteria for genetic testing due to the history of metastatic prostate cancer in her father.

## 2017-10-08 ENCOUNTER — Encounter: Payer: Self-pay | Admitting: Genetics

## 2017-10-08 ENCOUNTER — Ambulatory Visit: Payer: Self-pay | Admitting: Genetics

## 2017-10-08 DIAGNOSIS — C561 Malignant neoplasm of right ovary: Secondary | ICD-10-CM

## 2017-10-08 DIAGNOSIS — Z800 Family history of malignant neoplasm of digestive organs: Secondary | ICD-10-CM

## 2017-10-08 DIAGNOSIS — C569 Malignant neoplasm of unspecified ovary: Secondary | ICD-10-CM

## 2017-10-08 DIAGNOSIS — Z1379 Encounter for other screening for genetic and chromosomal anomalies: Secondary | ICD-10-CM

## 2017-10-08 DIAGNOSIS — Z8042 Family history of malignant neoplasm of prostate: Secondary | ICD-10-CM

## 2017-10-08 NOTE — Progress Notes (Signed)
HPI: Ms. Marksberry was previously seen in the [place] clinic on 09/15/2017 due to a personal and family history of cancer and concerns regarding a hereditary predisposition to cancer. Please refer to our prior cancer genetics clinic note for more information regarding Ms. Seiler's medical, social and family histories, and our assessment and recommendations, at the time. Ms. Keizer recent genetic test results were disclosed to her, as well as recommendations warranted by these results. These results and recommendations are discussed in more detail below.  In Oct 2018, at the age of 67, Ms. Lund underwent peritoneal biopsy that revealed carcinoma. She was diagnosed with right ovarian cancer .  She had bilateral Salpingo-oophorectomy with omentectomy, total abd hysterectomy and radical dissection for debulking on 12/32/2018.  She is undergoing chemotherapy.    CANCER HISTORY:  Oncology History   Genetic test on tissue sample did not show BRCA mutation     Ovarian cancer on right ([place])   11/12/2016 Imaging    Outside MRI imaging evaluating her back pain (compression fracture of T12) subsequent ultrasound showed a right adnexal mass measuring 7.4 x 6.7 cm containing both cystic and solid area 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       03/20/2017 Imaging    GYN ultrasound was performed this revealed a small fundus measuring 5 cm there are 2 small fibroids within the fundus with the largest measuring 1.3 cm Within the endometrium there is a small amount a fluid the endometrial wall was slightly thickened at 4.8 mm the ultrasound was consistent with a small endometrial polyp  The right adnexa measured 7.4 x 6.7 cm contained a both a cystic and solid mass with a small amount of shadowing This is consistent with a dermoid tumor the left ovary was 2.6 x 1.5 x 1.0 cm There is no other adnexal masses no free fluid within the abdomen no ascites          04/10/2017 Surgery    Operation:  Operative Laparscopy with  Peritoneal biopsy 2 hysteroscopy D&C Surgeon: Lynder Parents MD  Specimens: Peritoneal biopsy and cellular washings  Complications: None FIndings Small amount of ascites was found cellular washings were sent There was peritoneal studding And omental caking Even in steep Trendelenburg the ovaries and uterus were unable to be visualized due to the small intestines in the lower pelvis          04/10/2017 Pathology Results    Biopsy from [facility] was nondiagnostic.  Peritoneum biopsy did not reveal malignancy.  Endometrial biopsy also did not reveal malignancy.      04/13/2017 Tumor Marker    Patient's tumor was tested for the following markers: CA-125 Results of the tumor marker test revealed 324.1      04/20/2017 Tumor Marker    Patient's tumor was tested for the following markers: CA-125 Results of the tumor marker test revealed 345.6      04/22/2017 Pathology Results    Peritoneum, biopsy - CARCINOMA. - SEE COMMENT.      04/22/2017 Procedure    CT-guided core biopsy performed of peritoneal tumor in the left lower quadrant.      04/24/2017 -  Chemotherapy    She received carboplatin and Taxol x 3 cycles followed by interval surgical debulking on 12/31. Chemotherapy was subsequently resumed for 3 more cycles      06/05/2017 Tumor Marker    Patient's tumor was tested for the following markers: CA-125 Results of the tumor marker  test revealed 27.5      06/17/2017 Imaging    1. Considerable improvement in the prior omental caking, which now presents mainly as an indistinct stranding with slight nodularity along the left upper quadrant omentum. 2. Stable appearance of right ovarian mass with fatty and calcific elements classic for a dermoid. 3. There is accentuated enhancement in the walls of the common hepatic duct and common bile duct. A low-grade cholangitis is not readily excluded. 4. Other imaging findings of  potential clinical significance: Mild cardiomegaly. Aortic Atherosclerosis (ICD10-I70.0). Lumbar scoliosis, spondylosis, degenerative disc disease, and pars defects at L5. These cause impingement at L5-S1 and lesser impingement at L3-4 and L4-5. 5. 65% compression fracture at L1 slightly worsened than on the prior exam, with associated vertebral sclerosis and posterior retropulsion.      07/06/2017 Surgery    Bilateral Salpingo-Oophorectomy W/Omentectomy, Total Abd Hysterectomy & Radical Dissection For Debulking Right - Ureterolysis, With Or Without Repositioning Of Ureter For Retroperitoneal Fibrosis          07/06/2017 Pathology Results    A: Omentum, omentectomy - Omentum with minimal residual carcinoma, consistent with high grade serous carcinoma (microscopic; stage ypT3a) - Extensive treatment effect with calcifications, fibrosis, histiocytes, and giant cell response - CRS score 3  B: Uterus with cervix and left ovary and fallopian tube, hysterectomy and left salpingo-oophorectomy - Cervix: Ectocervix and endocervix with no dysplasia or malignancy identified - Endometrium: Inactive with no hyperplasia, atypia, or malignancy identified - Myometrium: Adenomyosis and benign leiomyoma, size 1.2 cm - Serosa: Focal calcification and histiocytes suggestive of treatment effect, with no viable carcinoma identified - Ovary, left: Physiologic changes and no carcinoma identified - Fallopian tube, left: 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 and no malignancy identified  C: Ovary and fallopian tube, right, salpingo-oophorectomy - Right fallopian tube with focal residual carcinoma, consistent with high grade serous carcinoma, and serous tubal intraepithelial carcinoma (STIC) - Associated calcifications and histiocytes suggestive of treatment response - Right ovary with calcifications and histiocytes suggestive of treatment effect, with no definite viable carcinoma identified - Mature  cystic teratoma, 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 - See synoptic report and comment      08/14/2017 Tumor Marker    Patient's tumor was tested for the following markers: CA-125 Results of the tumor marker test revealed 14.6      09/04/2017 Tumor Marker    Patient's tumor was tested for the following markers: CA-125 Results of the tumor marker test revealed 12.5      10/03/2017 Genetic Testing    The Common Hereditary Cancer Panel offered by [facility] includes sequencing and/or deletion duplication testing of the following 47 genes: APC, ATM, AXIN2, BARD1, BMPR1A, BRCA1, BRCA2, BRIP1, CDH1, CDKN2A (p14ARF), CDKN2A (p16INK4a), CKD4, CHEK2, CTNNA1, DICER1, EPCAM (Deletion/duplication testing only), GREM1 (promoter region deletion/duplication testing only), KIT, MEN1, MLH1, MSH2, MSH3, MSH6, MUTYH, NBN, NF1, NHTL1, PALB2, PDGFRA, PMS2, POLD1, POLE, PTEN, RAD50, RAD51C, RAD51D, SDHB, SDHC, SDHD, SMAD4, SMARCA4. STK11, TP53, TSC1, TSC2, and VHL.  The following genes were evaluated for sequence changes only: SDHA and HOXB13 c.251G>A variant only.  Results: Negative, no pathogenic variants identified.  The date of this test report is 10/03/2017.         FAMILY HISTORY:  We obtained a detailed, 4-generation family history.  Significant diagnoses are listed below: Family History  Problem Relation Age of Onset  . Colon cancer Mother 28  . Prostate cancer Father 74       metastatic  . Colon cancer Paternal  Uncle        age dx unk  . Heart attack Maternal Grandmother   . Aneurysm Maternal Grandfather   . Alzheimer's disease Paternal Grandmother   . Lung cancer Paternal Uncle   . Heart attack Paternal Uncle     Ms. Hangartner has 2 sisters and 2 brothers.  -1 sister is 35 with a history of Squamous cell skin cancer.  She had her ovaries removed.  She has no children. -1 sister is in her 20's with a history of 5 skin cancers Squamous cell and basal cell.  She had her ovaries removed and has no children.  -1 brother died in  his 81's due to heart disease.  -1 brother is 63 with no history of cancer.   Ms. Erichsen father died at 98 due to metastatic prostate cancer that was diagnosed at 67.  Mr. Pollino has 3 paternal uncles.  One died of a heart attack.  One uncle had colon cancer, and another uncle had lung cancer.  She has limited information about her paternal relatives, but as far as she is aware no paternal cousins with any history of cancer.  Ms. Fuente paternal grandfather died in a fire and her paternal grandmother died due to Alzheimer's disease.    Ms. Vantassel mother died at 16.  She had a history of colon cancer that was diagnsoed at 107.  Ms. Grabbe had 4 maternal aunts who are deceased and 2 maternal uncles who are deceased.  No history of cancer in these relatives.  No history of cancer reported in her maternal cousins. Ms. Berman maternal grandfather died due to an aneurysm in his late 9's, and Ms. Campanile's maternal grandmother died in her 70's/80's due to a heart attack.   Ms. Peachey is unaware of previous family history of genetic testing for hereditary cancer risks. Patient's maternal ancestors are of [place] descent, and paternal ancestors are of [place] descent. There is no reported Ashkenazi Jewish ancestry. There is no known consanguinity.  GENETIC TEST RESULTS: Genetic testing performed through [facility]'s Common Hereditary Cancers panel reported out on 10/03/2017 showed no pathogenic mutations. The Common Hereditary Cancer Panel offered by [facility] includes sequencing and/or deletion duplication testing of the following 47 genes: APC, ATM, AXIN2, BARD1, BMPR1A, BRCA1, BRCA2, BRIP1, CDH1, CDKN2A (p14ARF), CDKN2A (p16INK4a), CKD4, CHEK2, CTNNA1, DICER1, EPCAM (Deletion/duplication testing only), GREM1 (promoter region deletion/duplication testing only), KIT, MEN1, MLH1, MSH2, MSH3, MSH6, MUTYH, NBN, NF1, NHTL1, PALB2, PDGFRA, PMS2, POLD1, POLE, PTEN, RAD50, RAD51C, RAD51D, SDHB, SDHC, SDHD, SMAD4,  SMARCA4. STK11, TP53, TSC1, TSC2, and VHL.  The following genes were evaluated for sequence changes only: SDHA and HOXB13 c.251G>A variant only..  The test report will be scanned into EPIC and will be located under the Molecular Pathology section of the Results Review tab.A portion of the result report is included below for reference.     We discussed with Ms. Mancias that because current genetic testing is not perfect, it is possible there may be a gene mutation in one of these genes that current testing cannot detect, but that chance is small. We also discussed, that there could be another gene that has not yet been discovered, or that we have not yet tested, that is responsible for the cancer diagnoses in the family. It is also possible there is a hereditary cause for the cancer in the family that Ms. Peckman did not inherit and therefore was not identified in her testing.  Therefore, it is important to remain in  touch with cancer genetics in the future so that we can continue to offer Ms. Gritz the most up to date genetic testing.   ADDITIONAL GENETIC TESTING: We discussed with Ms. Haigh that there are other genes that are associated with increased cancer risk that can be analyzed. The laboratories that offer this testing look at these additional genes via a hereditary cancer gene panel. Should Ms. Dandridge wish to pursue additional genetic testing, we are happy to discuss and coordinate this testing, at any time.    CANCER SCREENING RECOMMENDATIONS: Ms. Recinos test result is negative (normal).  This means that we have not identified a hereditary cause for herpersonal and family history of cancer at this time.   While reassuring, this does not definitively rule out a hereditary basis for her cancer. It is still possible that there could be genetic mutations that are undetectable by current technology, or genetic mutations in genes that have not been tested or identified to increase cancer risk.   It is also possible there is a hereditary cause for the cancer in her family that Ms. Kurka did not inherit, and therefore was not identified in her testing.  Therefore, Ms. Shanks was advised to continue following the cancer screening guidelines provided by her primary healthcare providers. Other factors such as her personal and family history may still affect her cancer risk.  We recommended she continue to have colonoscopies every 5 years or as directed by her GI physician (due to her mother having colon cancer).   RECOMMENDATIONS FOR FAMILY MEMBERS: Relatives in this family might be at some increased risk of developing cancer, over the general population risk, simply due to the family history of cancer. We recommended women in this family have a yearly mammogram beginning at age 84, or 36 years younger than the earliest onset of cancer, an annual clinical breast exam, and perform monthly breast self-exams. Women in this family should also have a gynecological exam as recommended by their primary provider. All family members should have a colonoscopy by age 81 (or earlier depending on family history). We recommended her siblings all have colonoscopies every 5 years due to the family history of colon cancer in her mother.   All family members should inform their physicians about the family history of cancer so their doctors can make the most appropriate screening recommendations for them.   Based on Ms. Greenbaum family history, her siblings and all paternal relatives appear to also meet medical criteria for genetic testing.  Ms. Rettinger will let us know if we can be of any assistance in coordinating genetic counseling and/or testing for these family members.   FOLLOW-UP: Lastly, we discussed with Ms. Vanblarcom that cancer genetics is a rapidly advancing field and it is possible that new genetic tests will be appropriate for her and/or her family members in the future. We encouraged her to remain in contact  with cancer genetics on an annual basis so we can update her personal and family histories and let her know of advances in cancer genetics that may benefit this family.   Our contact number was provided. Ms. Mathisen questions were answered to her satisfaction, and she knows she is welcome to call us at anytime with additional questions or concerns.   Ferol Luz, MS, [facility] Certified Genetic Counselor [email or web address]

## 2017-10-28 ENCOUNTER — Inpatient Hospital Stay: Payer: Medicare Other | Attending: Hematology and Oncology

## 2017-10-28 ENCOUNTER — Ambulatory Visit (HOSPITAL_COMMUNITY)
Admission: RE | Admit: 2017-10-28 | Discharge: 2017-10-28 | Disposition: A | Discharge status: Home / Self Care | Payer: Medicare Other | Source: Ambulatory Visit | Attending: Hematology and Oncology | Admitting: Hematology and Oncology

## 2017-10-28 ENCOUNTER — Encounter (HOSPITAL_COMMUNITY): Payer: Self-pay

## 2017-10-28 DIAGNOSIS — Z9221 Personal history of antineoplastic chemotherapy: Secondary | ICD-10-CM | POA: Insufficient documentation

## 2017-10-28 DIAGNOSIS — R918 Other nonspecific abnormal finding of lung field: Secondary | ICD-10-CM | POA: Diagnosis not present

## 2017-10-28 DIAGNOSIS — M4854XA Collapsed vertebra, not elsewhere classified, thoracic region, initial encounter for fracture: Secondary | ICD-10-CM | POA: Diagnosis not present

## 2017-10-28 DIAGNOSIS — Z90722 Acquired absence of ovaries, bilateral: Secondary | ICD-10-CM | POA: Diagnosis not present

## 2017-10-28 DIAGNOSIS — C569 Malignant neoplasm of unspecified ovary: Secondary | ICD-10-CM

## 2017-10-28 DIAGNOSIS — C561 Malignant neoplasm of right ovary: Secondary | ICD-10-CM | POA: Diagnosis not present

## 2017-10-28 DIAGNOSIS — Z90710 Acquired absence of both cervix and uterus: Secondary | ICD-10-CM | POA: Insufficient documentation

## 2017-10-28 LAB — COMPREHENSIVE METABOLIC PANEL
ALBUMIN: 3.7 g/dL (ref 3.5–5.0)
ALK PHOS: 82 U/L (ref 40–150)
ALT: 19 U/L (ref 0–55)
AST: 17 U/L (ref 5–34)
Anion gap: 13 — ABNORMAL HIGH (ref 3–11)
BILIRUBIN TOTAL: 0.3 mg/dL (ref 0.2–1.2)
BUN: 12 mg/dL (ref 7–26)
CALCIUM: 9.3 mg/dL (ref 8.4–10.4)
CO2: 25 mmol/L (ref 22–29)
CREATININE: 0.8 mg/dL (ref 0.60–1.10)
Chloride: 103 mmol/L (ref 98–109)
GFR calc Af Amer: >60 mL/min (ref >60)
GFR calc non Af Amer: >60 mL/min (ref >60)
GLUCOSE: 149 mg/dL — AB (ref 70–140)
POTASSIUM: 4.2 mmol/L (ref 3.5–5.1)
Sodium: 141 mmol/L (ref 136–145)
Total Protein: 7 g/dL (ref 6.4–8.3)

## 2017-10-28 LAB — CBC WITH DIFFERENTIAL/PLATELET
Basophils Absolute: 0 10*3/uL (ref 0.0–0.1)
Basophils Relative: 1 %
EOS PCT: 2 %
Eosinophils Absolute: 0.1 10*3/uL (ref 0.0–0.5)
HCT: 27.7 % — ABNORMAL LOW (ref 34.8–46.6)
Hemoglobin: 9.1 g/dL — ABNORMAL LOW (ref 11.6–15.9)
LYMPHS PCT: 26 %
Lymphs Abs: 0.9 10*3/uL (ref 0.9–3.3)
MCH: 33.2 pg (ref 25.1–34.0)
MCHC: 33 g/dL (ref 31.5–36.0)
MCV: 100.4 fL (ref 79.5–101.0)
MONO ABS: 0.4 10*3/uL (ref 0.1–0.9)
Monocytes Relative: 11 %
Neutro Abs: 2.1 10*3/uL (ref 1.5–6.5)
Neutrophils Relative %: 60 %
PLATELETS: 243 10*3/uL (ref 145–400)
RBC: 2.76 MIL/uL — ABNORMAL LOW (ref 3.70–5.45)
RDW: 20.8 % — AB (ref 11.2–14.5)
WBC: 3.4 10*3/uL — ABNORMAL LOW (ref 3.9–10.3)

## 2017-10-28 MED ORDER — IOHEXOL 300 MG/ML  SOLN
100.0000 mL | Freq: Once | INTRAMUSCULAR | Status: AC | PRN
  Administered 2017-10-28: 100 mL via INTRAVENOUS

## 2017-10-30 ENCOUNTER — Inpatient Hospital Stay (HOSPITAL_BASED_OUTPATIENT_CLINIC_OR_DEPARTMENT_OTHER): Payer: Medicare Other | Admitting: Gynecology

## 2017-10-30 ENCOUNTER — Encounter: Payer: Self-pay | Admitting: Gynecology

## 2017-10-30 VITALS — BP 116/43 | HR 100 | Temp 98.2°F | Resp 20 | Ht 67.0 in | Wt 237.4 lb

## 2017-10-30 DIAGNOSIS — Z90710 Acquired absence of both cervix and uterus: Secondary | ICD-10-CM | POA: Diagnosis not present

## 2017-10-30 DIAGNOSIS — Z9221 Personal history of antineoplastic chemotherapy: Secondary | ICD-10-CM

## 2017-10-30 DIAGNOSIS — Z90722 Acquired absence of ovaries, bilateral: Secondary | ICD-10-CM | POA: Diagnosis not present

## 2017-10-30 DIAGNOSIS — C561 Malignant neoplasm of right ovary: Secondary | ICD-10-CM

## 2017-10-30 NOTE — Progress Notes (Signed)
Consult Note: Gyn-Onc   Cheryl Melton 67 y.o. female  Chief Complaint  Patient presents with  . Ovarian cancer on right [facility])    Assessment : Apparent stage III C ovarian carcinoma status post 3 cycles of carboplatin and Taxol neoadjuvant chemotherapy with an excellent resp Subsequently patient underwent interval debulking with an R0 resection and then 3 additional cycles of carboplatin and Taxol.  She is clinically free of disease based on exam, CT scan, and Ca1 25 value. Plan: Results of her CT scan and tumor markers were reviewed with the patient and her family.  She seems to be in remission and therefore we will schedule follow-up visits.  She will see Dr. [name] in 3 months and return to see me in 6 months.  Patient given the okay to return to full levels of activity.  She is given information regarding warning signs of recurrent disease which she should report if she develops new symptoms. Interval history: The patient is now completed our plan treatment and is doing well.  She recently had a CT scan showing no evidence of disease and Ca1 25 value was 12.5 units/mL (the lowest that she has been to date) overall she feels well.  She specifically denies any GI GU or pelvic symptoms.  Her functional status is good but limited by a compression fracture.  HPI: 67 year old white married female gravida  seen in consultation request of Dr.[name] regarding management of a newly diagnosed advanced ovarian cancer. 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 There is no free fluid or other adnexal masses noted. She underwent laparoscopy on 04/10/2017 with the surprise finding of peritoneal studding, omental cake, and a small amount of ascites. Biopsy of peritoneal implants was obtained but the result  is pending at the time of this dictation. No tumor markers have been obtained.  Retrospectively the patient denies any particular symptoms that might been associated with ovarian cancer. She has no family history of ovarian or breast cancer. Her mother had colon cancer. She has no other gynecologic history although she has primary infertility.  Patient was treated with neoadjuvant chemotherapy receiving 3 cycles of carboplatin and Taxol the last being June 05, 2017.  The Ca1 25 fell from 345 units/mL to 27.5 units/mL after 3 cycles.  CT scan after 3 cycles showed significant improvement in her omental cake and a persistent right ovarian mass that has fatty and calcified elements consistent with a dermoid.  She also has a compression fracture at L1.  As planned, the patient underwent interval debulking on July 08, 2017.  This included total abdominal hysterectomy, bilateral salpingo-nephrectomy, infracolic omentectomy.  She had an R0 resection.  Final pathology showed minimal residual carcinoma in her omentum with findings of extensive treatment effect with calcifications fibrosis histiocytes and giant cell response.  The right ovary had focal residual carcinoma consistent with high-grade carcinoma.  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  She had an uncomplicated postoperative course.  She subsequently received 3 additional cycles of carboplatin and Taxol (total of 6 cycles).  Last cycle was administered on September 25, 2017.  At that time her Ca1 25 value was 12.5 units/mL.  A follow-up CT scan showed no evidence of disease.    Review of Systems:10 point review  of systems is negative except as noted in interval history.   Vitals: Blood pressure (!) 116/43, pulse 100, temperature 98.2 F (36.8 C), temperature source Oral, resp. rate 20, height 5\' 7"  (1.702 m), weight 237 lb 6.4 oz (107.7 kg), SpO2 100 %.  Physical Exam: General : The patient is a healthy woman in no acute  distress.  HEENT: normocephalic, extraoccular movements normal; neck is supple without thyromegally  Lynphnodes: Supraclavicular and inguinal nodes not enlarged  Abdomen: obese, Soft, non-tender, no ascites, no organomegally, no masses, no hernias, midline incision is completely healed. Pelvic:  EGBUS: Normal female  Vagina: Normal, no lesions  Urethra and Bladder: Normal, non-tender  Cervix: Surgically absent Uterus:   Surgically absent Bi-manual examination: Non-tender; no adenxal masses or nodularity  Rectal: normal sphincter tone, no masses, no blood  Lower extremities: No edema or varicosities. Normal range of motion      Allergies  Allergen Reactions  . Aspirin Other (See Comments)    NSAIDS( advised not to take per hematology)  . Nsaids Other (See Comments)    Gastritis  . Tramadol Other (See Comments)    Headache, vomiting and disorientation     Past Medical History:  Diagnosis Date  . Anxiety   . Cancer ([place])   . Diabetes mellitus without complication ([place])   . Family history of colon cancer   . Family history of prostate cancer   . GERD (gastroesophageal reflux disease)   . Osteoporosis   . Parathyroid adenoma     Past Surgical History:  Procedure Laterality Date  . CHOLECYSTECTOMY    . COLONOSCOPY WITH ESOPHAGOGASTRODUODENOSCOPY (EGD)    . DILATION AND CURETTAGE OF UTERUS    . ELBOW SURGERY    . PARATHYROIDECTOMY  10/21/2016   One side done pt not sure which side.   Marland Kitchen ROTATOR CUFF REPAIR Right   . TONSILLECTOMY      Current Outpatient Medications  Medication Sig Dispense Refill  . acetaminophen (TYLENOL) 650 MG CR tablet Take 1,300 mg by mouth every 8 (eight) hours.     Marland Kitchen alendronate (FOSAMAX) 70 MG tablet Take 70 mg by mouth every Sunday.     Marland Kitchen atorvastatin (LIPITOR) 80 MG tablet Take 40 mg by mouth at bedtime.     . Cholecalciferol (VITAMIN D3) 2000 units TABS Take 2,000 Units by mouth daily.    . citalopram (CELEXA) 10 MG tablet Take 10 mg by  mouth at bedtime    . gabapentin (NEURONTIN) 300 MG capsule Take by mouth.    . Glucosamine Sulfate 500 MG TABS Take 500 mg by mouth 2 (two) times daily.     Marland Kitchen linagliptin (TRADJENTA) 5 MG TABS tablet Take 5 mg by mouth daily.     Marland Kitchen loratadine (CLARITIN) 10 MG tablet Take 10 mg by mouth at bedtime.     Marland Kitchen losartan (COZAAR) 25 MG tablet Take 25 mg by mouth daily.     . meclizine (ANTIVERT) 25 MG tablet Take 25 mg by mouth 2 (two) times daily as needed for dizziness.     . metFORMIN (GLUCOPHAGE) 1000 MG tablet Take 1,000 mg by mouth 2 (two) times daily with a meal.     . omeprazole (PRILOSEC) 40 MG capsule Take 40 mg by mouth daily.     Marland Kitchen oxyCODONE 10 MG TABS Take 1 tablet (10 mg total) by mouth every 4 (four) hours as needed for severe pain. 30 tablet 0  . pioglitazone (ACTOS) 15 MG tablet Take 15 mg  by mouth at bedtime.     . vitamin B-12 (CYANOCOBALAMIN) 1000 MCG tablet Take 1,000 mcg by mouth daily.      No current facility-administered medications for this visit.    Facility-Administered Medications Ordered in Other Visits  Medication Dose Route Frequency Provider Last Rate Last Dose  . sodium chloride flush (NS) 0.9 % injection 10 mL  10 mL Intracatheter Once Heath Lark, MD        Social History   Socioeconomic History  . Marital status: Married    Spouse name: Mikeal Hawthorne  . Number of children: 0  . Years of education: Not on file  . Highest education level: Not on file  Occupational History  . Occupation: retired  Scientific laboratory technician  . Financial resource strain: Not on file  . Food insecurity:    Worry: Not on file    Inability: Not on file  . Transportation needs:    Medical: Not on file    Non-medical: Not on file  Tobacco Use  . Smoking status: Never Smoker  . Smokeless tobacco: Never Used  Substance and Sexual Activity  . Alcohol use: No  . Drug use: No  . Sexual activity: Not on file  Lifestyle  . Physical activity:    Days per week: Not on file    Minutes per session:  Not on file  . Stress: Not on file  Relationships  . Social connections:    Talks on phone: Not on file    Gets together: Not on file    Attends religious service: Not on file    Active member of club or organization: Not on file    Attends meetings of clubs or organizations: Not on file    Relationship status: Not on file  . Intimate partner violence:    Fear of current or ex partner: Not on file    Emotionally abused: Not on file    Physically abused: Not on file    Forced sexual activity: Not on file  Other Topics Concern  . Not on file  Social History Narrative  . Not on file    Family History  Problem Relation Age of Onset  . Colon cancer Mother 34  . Prostate cancer Father 29       metastatic  . Colon cancer Paternal Uncle        age dx unk  . Heart attack Maternal Grandmother   . Aneurysm Maternal Grandfather   . Alzheimer's disease Paternal Grandmother   . Lung cancer Paternal Uncle   . Heart attack Paternal Uncle       [name], MD 10/30/2017, 11:02 AM

## 2017-10-30 NOTE — Patient Instructions (Signed)
Plan to follow up with Dr. [name] in three months and Dr. Fermin Schwab in six months or sooner if needed.  We will plan to check your CA 125 every three months.  Please call for any needs, concerns, or new symptoms such as abdominal bloating, pain, change in bowel or bladder habits or any changes.

## 2018-01-25 ENCOUNTER — Inpatient Hospital Stay: Payer: Medicare Other | Attending: Hematology and Oncology

## 2018-01-25 DIAGNOSIS — C561 Malignant neoplasm of right ovary: Secondary | ICD-10-CM | POA: Diagnosis present

## 2018-01-25 DIAGNOSIS — D509 Iron deficiency anemia, unspecified: Secondary | ICD-10-CM | POA: Diagnosis not present

## 2018-01-25 DIAGNOSIS — C569 Malignant neoplasm of unspecified ovary: Secondary | ICD-10-CM

## 2018-01-25 DIAGNOSIS — Z9221 Personal history of antineoplastic chemotherapy: Secondary | ICD-10-CM | POA: Diagnosis not present

## 2018-01-25 LAB — CBC WITH DIFFERENTIAL/PLATELET
BASOS ABS: 0 10*3/uL (ref 0.0–0.1)
Basophils Relative: 0 %
Eosinophils Absolute: 0.1 10*3/uL (ref 0.0–0.5)
Eosinophils Relative: 1 %
HEMATOCRIT: 33.8 % — AB (ref 34.8–46.6)
Hemoglobin: 10.4 g/dL — ABNORMAL LOW (ref 11.6–15.9)
Lymphocytes Relative: 29 %
Lymphs Abs: 1.2 10*3/uL (ref 0.9–3.3)
MCH: 30.5 pg (ref 25.1–34.0)
MCHC: 30.8 g/dL — ABNORMAL LOW (ref 31.5–36.0)
MCV: 99.1 fL (ref 79.5–101.0)
Monocytes Absolute: 0.3 10*3/uL (ref 0.1–0.9)
Monocytes Relative: 6 %
NEUTROS ABS: 2.6 10*3/uL (ref 1.5–6.5)
Neutrophils Relative %: 64 %
Platelets: 223 10*3/uL (ref 145–400)
RBC: 3.41 MIL/uL — AB (ref 3.70–5.45)
RDW: 14.9 % — ABNORMAL HIGH (ref 11.2–14.5)
WBC: 4.1 10*3/uL (ref 3.9–10.3)

## 2018-01-25 LAB — COMPREHENSIVE METABOLIC PANEL
ALT: 21 U/L (ref 0–44)
AST: 17 U/L (ref 15–41)
Albumin: 3.9 g/dL (ref 3.5–5.0)
Alkaline Phosphatase: 91 U/L (ref 38–126)
Anion gap: 10 (ref 5–15)
BILIRUBIN TOTAL: 0.3 mg/dL (ref 0.3–1.2)
BUN: 15 mg/dL (ref 8–23)
CALCIUM: 9.5 mg/dL (ref 8.9–10.3)
CO2: 28 mmol/L (ref 22–32)
Chloride: 103 mmol/L (ref 98–111)
Creatinine, Ser: 0.77 mg/dL (ref 0.44–1.00)
GFR calc Af Amer: >60 mL/min (ref >60)
GFR calc non Af Amer: >60 mL/min (ref >60)
Glucose, Bld: 133 mg/dL — ABNORMAL HIGH (ref 70–99)
Potassium: 4 mmol/L (ref 3.5–5.1)
Sodium: 141 mmol/L (ref 135–145)
Total Protein: 7.2 g/dL (ref 6.5–8.1)

## 2018-01-26 ENCOUNTER — Telehealth: Payer: Self-pay

## 2018-01-26 LAB — CA 125: Cancer Antigen (CA) 125: 9.2 U/mL (ref 0.0–38.1)

## 2018-01-26 NOTE — Telephone Encounter (Signed)
Called back per Dr. [name]. She can decide if she wants to be around her sister who has shingles, it would be like anyone else. She verbalized understanding. She will get her other sister to bring her and avoid her sister until the area scabs up.

## 2018-01-26 NOTE — Telephone Encounter (Signed)
She called and left a message. She has appt 7/25 with Dr. [name]. Her sister was going to bring her to the appt. He sister has shingles. Is it okay to be around her sister?

## 2018-01-28 ENCOUNTER — Inpatient Hospital Stay (HOSPITAL_BASED_OUTPATIENT_CLINIC_OR_DEPARTMENT_OTHER): Payer: Medicare Other | Admitting: Hematology and Oncology

## 2018-01-28 ENCOUNTER — Other Ambulatory Visit: Payer: Medicare Other

## 2018-01-28 ENCOUNTER — Telehealth: Payer: Self-pay | Admitting: Hematology and Oncology

## 2018-01-28 ENCOUNTER — Encounter: Payer: Self-pay | Admitting: Hematology and Oncology

## 2018-01-28 ENCOUNTER — Other Ambulatory Visit: Payer: Self-pay | Admitting: Hematology and Oncology

## 2018-01-28 DIAGNOSIS — Z9221 Personal history of antineoplastic chemotherapy: Secondary | ICD-10-CM | POA: Diagnosis not present

## 2018-01-28 DIAGNOSIS — D509 Iron deficiency anemia, unspecified: Secondary | ICD-10-CM | POA: Diagnosis not present

## 2018-01-28 DIAGNOSIS — C561 Malignant neoplasm of right ovary: Secondary | ICD-10-CM | POA: Diagnosis not present

## 2018-01-28 DIAGNOSIS — Z862 Personal history of diseases of the blood and blood-forming organs and certain disorders involving the immune mechanism: Secondary | ICD-10-CM

## 2018-01-28 DIAGNOSIS — E119 Type 2 diabetes mellitus without complications: Secondary | ICD-10-CM

## 2018-01-28 NOTE — Telephone Encounter (Signed)
No 7/25 orders.

## 2018-01-29 DIAGNOSIS — D509 Iron deficiency anemia, unspecified: Secondary | ICD-10-CM | POA: Insufficient documentation

## 2018-01-29 NOTE — Assessment & Plan Note (Signed)
Per patient request, I have reviewed her prior evaluation for iron deficiency anemia She is not symptomatic from the anemia She would like to be treated for iron deficiency anemia We discussed extensively about work-up.  She had extensive EGD, colonoscopy and capsule endoscopy which revealed no source of bleeding The patient could not remember whether she was taking iron supplement correctly After extensive discussion, we are in agreement to proceed with oral iron supplement daily for at least 2 weeks If she tolerated iron supplement without significant side effects, I recommend the patient to increase to twice a day if possible When she returns in 3 months to get her blood count rechecked, I will add iron studies and will call the patient with test results If she is not able to improve her iron deficiency through oral administration, we will proceed with intravenous iron infusion

## 2018-01-29 NOTE — Progress Notes (Signed)
[facility] OFFICE PROGRESS NOTE  Patient Care Team: Patient, No Pcp Per as PCP - General (General Practice)  ASSESSMENT & PLAN:  Ovarian cancer on right [facility]) From the ovarian cancer standpoint, tumor marker is stable She have no signs or symptoms to suggest cancer recurrence She will follow with GYN oncologist in 3 months  Iron deficiency anemia Per patient request, I have reviewed her prior evaluation for iron deficiency anemia She is not symptomatic from the anemia She would like to be treated for iron deficiency anemia We discussed extensively about work-up.  She had extensive EGD, colonoscopy and capsule endoscopy which revealed no source of bleeding The patient could not remember whether she was taking iron supplement correctly After extensive discussion, we are in agreement to proceed with oral iron supplement daily for at least 2 weeks If she tolerated iron supplement without significant side effects, I recommend the patient to increase to twice a day if possible When she returns in 3 months to get her blood count rechecked, I will add iron studies and will call the patient with test results If she is not able to improve her iron deficiency through oral administration, we will proceed with intravenous iron infusion  Type 2 diabetes mellitus without complication ([place]) She is receiving treatment under the guidance of her endocrinologist She denies worsening peripheral neuropathy since chemotherapy   No orders of the defined types were placed in this encounter.   INTERVAL HISTORY: Please see below for problem oriented charting. She returns with her sister for further follow-up Since last time I saw her, she has been doing well Denies abdominal bloating, nausea, changes in bowel habits or vaginal bleeding Her diabetes is well controlled Denies significant residual peripheral neuropathy from prior treatment  She is requesting further work-up/treatment for iron  deficiency anemia With the patient's permission, I have reviewed her prior work-up She stated that her mother was diagnosed with anemia all her life She had extensive GI evaluation including negative EGD, colonoscopy and capsule endoscopy Prior to initiation of chemotherapy, she was not anemic.  However, since then, she have some mild signs of anemia She had recent blood work done through her endocrinologist and I was able to review the test results through electronic system.  She does have signs of mild iron deficiency. The patient denies any recent signs or symptoms of bleeding such as spontaneous epistaxis, hematuria or hematochezia. She denies pica. 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       03/20/2017 Imaging    GYN ultrasound was performed this revealed a small fundus measuring 5 cm there are 2 small fibroids within the fundus with the largest measuring 1.3 cm Within the endometrium there is a small amount a fluid the endometrial wall was slightly thickened at 4.8 mm the ultrasound was consistent with a small endometrial polyp  The right adnexa measured 7.4 x 6.7 cm contained a both a cystic and solid mass with a small amount of shadowing This is consistent with a dermoid tumor the left ovary was 2.6 x 1.5  x 1.0 cm There is no other adnexal masses no free fluid within the abdomen no ascites          04/10/2017 Surgery    Operation: Operative Laparscopy with  Peritoneal biopsy 2 hysteroscopy D&C Surgeon: Lynder Parents MD  Specimens: Peritoneal biopsy and cellular  washings  Complications: None FIndings Small amount of ascites was found cellular washings were sent There was peritoneal studding And omental caking Even in steep Trendelenburg the ovaries and uterus were unable to be visualized due to the small intestines in the lower pelvis          04/10/2017 Pathology Results    Biopsy from [facility] was nondiagnostic.  Peritoneum biopsy did not reveal malignancy.  Endometrial biopsy also did not reveal malignancy.      04/13/2017 Tumor Marker    Patient's tumor was tested for the following markers: CA-125 Results of the tumor marker test revealed 324.1      04/20/2017 Tumor Marker    Patient's tumor was tested for the following markers: CA-125 Results of the tumor marker test revealed 345.6      04/22/2017 Pathology Results    Peritoneum, biopsy - CARCINOMA. - SEE COMMENT.      04/22/2017 Procedure    CT-guided core biopsy performed of peritoneal tumor in the left lower quadrant.      04/24/2017 -  Chemotherapy    She received carboplatin and Taxol x 3 cycles followed by interval surgical debulking on 12/31. Chemotherapy was subsequently resumed for 3 more cycles      06/05/2017 Tumor Marker    Patient's tumor was tested for the following markers: CA-125 Results of the tumor marker test revealed 27.5      06/17/2017 Imaging    1. Considerable improvement in the prior omental caking, which now presents mainly as an indistinct stranding with slight nodularity along the left upper quadrant omentum. 2. Stable appearance of right ovarian mass with fatty and calcific elements classic for a dermoid. 3. There is accentuated enhancement in the walls of the common hepatic duct and common bile duct. A low-grade cholangitis is not readily excluded. 4. Other imaging findings of potential clinical significance: Mild cardiomegaly. Aortic Atherosclerosis (ICD10-I70.0). Lumbar scoliosis, spondylosis, degenerative disc  disease, and pars defects at L5. These cause impingement at L5-S1 and lesser impingement at L3-4 and L4-5. 5. 65% compression fracture at L1 slightly worsened than on the prior exam, with associated vertebral sclerosis and posterior retropulsion.      07/06/2017 Surgery    Bilateral Salpingo-Oophorectomy W/Omentectomy, Total Abd Hysterectomy & Radical Dissection For Debulking Right - Ureterolysis, With Or Without Repositioning Of Ureter For Retroperitoneal Fibrosis          07/06/2017 Pathology Results    A: Omentum, omentectomy - Omentum with minimal residual carcinoma, consistent with high grade serous carcinoma (microscopic; stage ypT3a) - Extensive treatment effect with calcifications, fibrosis, histiocytes, and giant cell response - CRS score 3  B: Uterus with cervix and left ovary and fallopian tube, hysterectomy and left salpingo-oophorectomy - Cervix: Ectocervix and endocervix with no dysplasia or malignancy identified - Endometrium: Inactive with no hyperplasia, atypia, or malignancy identified - Myometrium: Adenomyosis and benign leiomyoma, size 1.2 cm - Serosa: Focal calcification and histiocytes suggestive of treatment effect, with no viable carcinoma identified - Ovary, left: Physiologic changes and no carcinoma identified - Fallopian tube, left: 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 and no malignancy identified  C: Ovary and fallopian tube, right,  salpingo-oophorectomy - Right fallopian tube with focal residual carcinoma, consistent with high grade serous carcinoma, and serous tubal intraepithelial carcinoma (STIC) - Associated calcifications and histiocytes suggestive of treatment response - Right ovary with calcifications and histiocytes suggestive of treatment effect, with no definite viable carcinoma identified - Mature cystic teratoma, 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 - See synoptic report and comment      08/14/2017 Tumor Marker    Patient's tumor was tested for the  following markers: CA-125 Results of the tumor marker test revealed 14.6      09/04/2017 Tumor Marker    Patient's tumor was tested for the following markers: CA-125 Results of the tumor marker test revealed 12.5      10/03/2017 Genetic Testing    The Common Hereditary Cancer Panel offered by [facility] includes sequencing and/or deletion duplication testing of the following 47 genes: APC, ATM, AXIN2, BARD1, BMPR1A, BRCA1, BRCA2, BRIP1, CDH1, CDKN2A (p14ARF), CDKN2A (p16INK4a), CKD4, CHEK2, CTNNA1, DICER1, EPCAM (Deletion/duplication testing only), GREM1 (promoter region deletion/duplication testing only), KIT, MEN1, MLH1, MSH2, MSH3, MSH6, MUTYH, NBN, NF1, NHTL1, PALB2, PDGFRA, PMS2, POLD1, POLE, PTEN, RAD50, RAD51C, RAD51D, SDHB, SDHC, SDHD, SMAD4, SMARCA4. STK11, TP53, TSC1, TSC2, and VHL.  The following genes were evaluated for sequence changes only: SDHA and HOXB13 c.251G>A variant only.  Results: Negative, no pathogenic variants identified.  The date of this test report is 10/03/2017.       10/28/2017 Imaging    1. Stable pulmonary nodules. No new or progressive findings. 2. No residual or recurrent omental disease is identified. No obvious peritoneal surface disease. 3. Increasing soft tissue density and ill-defined interstitial changes around the abdominal wall surgical incision. This may be progressive postoperative changes or granulation tissue but attention on future scans to exclude the possibility of tumor. 4. Status post resection of right adnexal mass. No pelvic mass or pelvic adenopathy. 5. Stable T12 compression fracture.      01/25/2018 Tumor Marker    Patient's tumor was tested for the following markers: CA-125 Results of the tumor marker test revealed 9.2       REVIEW OF SYSTEMS:   Constitutional: Denies fevers, chills or abnormal weight loss Eyes: Denies blurriness of vision Ears, nose, mouth, throat, and face: Denies mucositis or sore throat Respiratory: Denies cough,  dyspnea or wheezes Cardiovascular: Denies palpitation, chest discomfort or lower extremity swelling Gastrointestinal:  Denies nausea, heartburn or change in bowel habits Skin: Denies abnormal skin rashes Lymphatics: Denies new lymphadenopathy or easy bruising Neurological:Denies numbness, tingling or new weaknesses Behavioral/Psych: Mood is stable, no new changes  All other systems were reviewed with the patient and are negative.  I have reviewed the past medical history, past surgical history, social history and family history with the patient and they are unchanged from previous note.  ALLERGIES:  is allergic to aspirin; nsaids; and tramadol.  MEDICATIONS:  Current Outpatient Medications  Medication Sig Dispense Refill  . acetaminophen (TYLENOL) 650 MG CR tablet Take 1,300 mg by mouth every 8 (eight) hours.     Marland Kitchen alendronate (FOSAMAX) 70 MG tablet Take 70 mg by mouth every Sunday.     Marland Kitchen atorvastatin (LIPITOR) 80 MG tablet Take 40 mg by mouth at bedtime.     . Cholecalciferol (VITAMIN D3) 2000 units TABS Take 2,000 Units by mouth daily.    . citalopram (CELEXA) 10 MG tablet Take 10 mg by mouth at bedtime    . gabapentin (NEURONTIN) 300 MG capsule Take by mouth.    . Glucosamine Sulfate 500  MG TABS Take 500 mg by mouth 2 (two) times daily.     Marland Kitchen loratadine (CLARITIN) 10 MG tablet Take 10 mg by mouth at bedtime.     Marland Kitchen losartan (COZAAR) 25 MG tablet Take 25 mg by mouth daily.     . meclizine (ANTIVERT) 25 MG tablet Take 25 mg by mouth 2 (two) times daily as needed for dizziness.     . metFORMIN (GLUCOPHAGE) 1000 MG tablet Take 1,000 mg by mouth 2 (two) times daily with a meal.     . omeprazole (PRILOSEC) 40 MG capsule Take 40 mg by mouth daily.     . pioglitazone (ACTOS) 15 MG tablet Take 15 mg by mouth at bedtime.     . vitamin B-12 (CYANOCOBALAMIN) 1000 MCG tablet Take 1,000 mcg by mouth daily.      No current facility-administered medications for this visit.    Facility-Administered  Medications Ordered in Other Visits  Medication Dose Route Frequency Provider Last Rate Last Dose  . sodium chloride flush (NS) 0.9 % injection 10 mL  10 mL Intracatheter Once Heath Lark, MD        PHYSICAL EXAMINATION: ECOG PERFORMANCE STATUS: 0 - Asymptomatic  Vitals:   01/28/18 0829  BP: 115/62  Pulse: 85  Resp: 18  Temp: 97.6 F (36.4 C)  SpO2: 96%   Filed Weights   01/28/18 0829  Weight: 236 lb 6.4 oz (107.2 kg)    GENERAL:alert, no distress and comfortable SKIN: skin color, texture, turgor are normal, no rashes or significant lesions EYES: normal, Conjunctiva are pink and non-injected, sclera clear OROPHARYNX:no exudate, no erythema and lips, buccal mucosa, and tongue normal  NECK: supple, thyroid normal size, non-tender, without nodularity LYMPH:  no palpable lymphadenopathy in the cervical, axillary or inguinal LUNGS: clear to auscultation and percussion with normal breathing effort HEART: regular rate & rhythm and no murmurs and no lower extremity edema ABDOMEN:abdomen soft, non-tender and normal bowel sounds Musculoskeletal:no cyanosis of digits and no clubbing  NEURO: alert & oriented x 3 with fluent speech, no focal motor/sensory deficits  LABORATORY DATA:  I have reviewed the data as listed    Component Value Date/Time   NA 141 01/25/2018 1111   NA 139 06/05/2017 0746   K 4.0 01/25/2018 1111   K 4.1 06/05/2017 0746   CL 103 01/25/2018 1111   CO2 28 01/25/2018 1111   CO2 21 (L) 06/05/2017 0746   GLUCOSE 133 (H) 01/25/2018 1111   GLUCOSE 279 (H) 06/05/2017 0746   BUN 15 01/25/2018 1111   BUN 15.0 06/05/2017 0746   CREATININE 0.77 01/25/2018 1111   CREATININE 0.9 06/05/2017 0746   CALCIUM 9.5 01/25/2018 1111   CALCIUM 10.1 06/05/2017 0746   PROT 7.2 01/25/2018 1111   PROT 8.1 06/05/2017 0746   ALBUMIN 3.9 01/25/2018 1111   ALBUMIN 4.0 06/05/2017 0746   AST 17 01/25/2018 1111   AST 22 06/05/2017 0746   ALT 21 01/25/2018 1111   ALT 36 06/05/2017  0746   ALKPHOS 91 01/25/2018 1111   ALKPHOS 98 06/05/2017 0746   BILITOT 0.3 01/25/2018 1111   BILITOT 0.44 06/05/2017 0746   GFRNONAA >60 01/25/2018 1111   GFRAA >60 01/25/2018 1111    No results found for: SPEP, UPEP  Lab Results  Component Value Date   WBC 4.1 01/25/2018   NEUTROABS 2.6 01/25/2018   HGB 10.4 (L) 01/25/2018   HCT 33.8 (L) 01/25/2018   MCV 99.1 01/25/2018   PLT 223 01/25/2018  Chemistry      Component Value Date/Time   NA 141 01/25/2018 1111   NA 139 06/05/2017 0746   K 4.0 01/25/2018 1111   K 4.1 06/05/2017 0746   CL 103 01/25/2018 1111   CO2 28 01/25/2018 1111   CO2 21 (L) 06/05/2017 0746   BUN 15 01/25/2018 1111   BUN 15.0 06/05/2017 0746   CREATININE 0.77 01/25/2018 1111   CREATININE 0.9 06/05/2017 0746      Component Value Date/Time   CALCIUM 9.5 01/25/2018 1111   CALCIUM 10.1 06/05/2017 0746   ALKPHOS 91 01/25/2018 1111   ALKPHOS 98 06/05/2017 0746   AST 17 01/25/2018 1111   AST 22 06/05/2017 0746   ALT 21 01/25/2018 1111   ALT 36 06/05/2017 0746   BILITOT 0.3 01/25/2018 1111   BILITOT 0.44 06/05/2017 0746      All questions were answered. The patient knows to call the clinic with any problems, questions or concerns. No barriers to learning was detected.  I spent 25 minutes counseling the patient face to face. The total time spent in the appointment was 30 minutes and more than 50% was on counseling and review of test results  Heath Lark, MD 01/29/2018 8:00 AM

## 2018-01-29 NOTE — Assessment & Plan Note (Signed)
She is receiving treatment under the guidance of her endocrinologist She denies worsening peripheral neuropathy since chemotherapy

## 2018-01-29 NOTE — Assessment & Plan Note (Signed)
From the ovarian cancer standpoint, tumor marker is stable She have no signs or symptoms to suggest cancer recurrence She will follow with GYN oncologist in 3 months

## 2018-02-24 ENCOUNTER — Telehealth: Payer: Self-pay | Admitting: *Deleted

## 2018-02-24 ENCOUNTER — Other Ambulatory Visit: Payer: Self-pay | Admitting: Internal Medicine

## 2018-02-24 DIAGNOSIS — M810 Age-related osteoporosis without current pathological fracture: Secondary | ICD-10-CM

## 2018-02-24 NOTE — Telephone Encounter (Signed)
Dr [name] says patient can take iron daily in evening as described earlier or can take prior to lunch and prior to dinner.

## 2018-02-24 NOTE — Telephone Encounter (Signed)
Cheryl Melton left a message stating she is supposed to take iron on an empty stomach and not take at the same time as omeprazole. She remembered that her omeprazole is time released and wondered if that would interfere with the iron absorption.

## 2018-02-24 NOTE — Telephone Encounter (Signed)
She should space it out If she takes omeprazole in the morning I suggest she takes iron in the evening, half and hour before dinner or at bedtime

## 2018-04-19 ENCOUNTER — Ambulatory Visit
Admission: RE | Admit: 2018-04-19 | Discharge: 2018-04-19 | Disposition: A | Discharge status: Home / Self Care | Payer: Medicare Other | Source: Ambulatory Visit | Attending: Internal Medicine | Admitting: Internal Medicine

## 2018-04-19 DIAGNOSIS — M810 Age-related osteoporosis without current pathological fracture: Secondary | ICD-10-CM

## 2018-04-22 ENCOUNTER — Inpatient Hospital Stay: Payer: Medicare Other | Attending: Hematology and Oncology

## 2018-04-22 DIAGNOSIS — C561 Malignant neoplasm of right ovary: Secondary | ICD-10-CM | POA: Diagnosis present

## 2018-04-22 DIAGNOSIS — Z9221 Personal history of antineoplastic chemotherapy: Secondary | ICD-10-CM | POA: Diagnosis not present

## 2018-04-22 DIAGNOSIS — Z862 Personal history of diseases of the blood and blood-forming organs and certain disorders involving the immune mechanism: Secondary | ICD-10-CM

## 2018-04-22 DIAGNOSIS — C569 Malignant neoplasm of unspecified ovary: Secondary | ICD-10-CM

## 2018-04-22 DIAGNOSIS — D649 Anemia, unspecified: Secondary | ICD-10-CM | POA: Insufficient documentation

## 2018-04-22 LAB — CBC WITH DIFFERENTIAL/PLATELET
Abs Immature Granulocytes: 0.02 10*3/uL (ref 0.00–0.07)
BASOS ABS: 0 10*3/uL (ref 0.0–0.1)
BASOS PCT: 1 %
EOS PCT: 2 %
Eosinophils Absolute: 0.1 10*3/uL (ref 0.0–0.5)
HEMATOCRIT: 33.4 % — AB (ref 36.0–46.0)
Hemoglobin: 10.3 g/dL — ABNORMAL LOW (ref 12.0–15.0)
Immature Granulocytes: 1 %
LYMPHS ABS: 1.2 10*3/uL (ref 0.7–4.0)
Lymphocytes Relative: 28 %
MCH: 29.9 pg (ref 26.0–34.0)
MCHC: 30.8 g/dL (ref 30.0–36.0)
MCV: 96.8 fL (ref 80.0–100.0)
Monocytes Absolute: 0.3 10*3/uL (ref 0.1–1.0)
Monocytes Relative: 7 %
NRBC: 0 % (ref 0.0–0.2)
Neutro Abs: 2.7 10*3/uL (ref 1.7–7.7)
Neutrophils Relative %: 61 %
PLATELETS: 231 10*3/uL (ref 150–400)
RBC: 3.45 MIL/uL — ABNORMAL LOW (ref 3.87–5.11)
RDW: 15.2 % (ref 11.5–15.5)
WBC: 4.4 10*3/uL (ref 4.0–10.5)

## 2018-04-22 LAB — COMPREHENSIVE METABOLIC PANEL
ALBUMIN: 3.8 g/dL (ref 3.5–5.0)
ALT: 27 U/L (ref 0–44)
AST: 21 U/L (ref 15–41)
Alkaline Phosphatase: 93 U/L (ref 38–126)
Anion gap: 8 (ref 5–15)
BILIRUBIN TOTAL: 0.3 mg/dL (ref 0.3–1.2)
BUN: 18 mg/dL (ref 8–23)
CHLORIDE: 103 mmol/L (ref 98–111)
CO2: 27 mmol/L (ref 22–32)
Calcium: 9.5 mg/dL (ref 8.9–10.3)
Creatinine, Ser: 0.82 mg/dL (ref 0.44–1.00)
GFR calc Af Amer: >60 mL/min (ref >60)
Glucose, Bld: 176 mg/dL — ABNORMAL HIGH (ref 70–99)
POTASSIUM: 4.4 mmol/L (ref 3.5–5.1)
Sodium: 138 mmol/L (ref 135–145)
TOTAL PROTEIN: 7.6 g/dL (ref 6.5–8.1)

## 2018-04-22 LAB — IRON AND TIBC
IRON: 49 ug/dL (ref 41–142)
SATURATION RATIOS: 15 % — AB (ref 21–57)
TIBC: 324 ug/dL (ref 236–444)
UIBC: 275 ug/dL

## 2018-04-22 LAB — FERRITIN: Ferritin: 78 ng/mL (ref 11–307)

## 2018-04-23 ENCOUNTER — Telehealth: Payer: Self-pay | Admitting: *Deleted

## 2018-04-23 LAB — CA 125: CANCER ANTIGEN (CA) 125: 10.2 U/mL (ref 0.0–38.1)

## 2018-04-23 NOTE — Telephone Encounter (Signed)
Called patient to inform her of her lab results from 04/22/18.  Per Joylene John, NP, her CA 125 is 10.2, and it is within normal limits.  Patient was very appreciative and verbalized understanding. I reminded patient of her appointment with Dr. Fermin Schwab on Tuesday, October 22nd at [facility].  Patient also verbalized understanding.

## 2018-04-27 ENCOUNTER — Encounter: Payer: Self-pay | Admitting: Gynecology

## 2018-04-27 ENCOUNTER — Inpatient Hospital Stay (HOSPITAL_BASED_OUTPATIENT_CLINIC_OR_DEPARTMENT_OTHER): Payer: Medicare Other | Admitting: Gynecology

## 2018-04-27 VITALS — BP 116/59 | HR 83 | Temp 98.3°F | Resp 16 | Ht 67.0 in | Wt 236.2 lb

## 2018-04-27 DIAGNOSIS — C569 Malignant neoplasm of unspecified ovary: Secondary | ICD-10-CM | POA: Diagnosis not present

## 2018-04-27 DIAGNOSIS — D649 Anemia, unspecified: Secondary | ICD-10-CM | POA: Diagnosis not present

## 2018-04-27 DIAGNOSIS — C561 Malignant neoplasm of right ovary: Secondary | ICD-10-CM

## 2018-04-27 DIAGNOSIS — Z9221 Personal history of antineoplastic chemotherapy: Secondary | ICD-10-CM

## 2018-04-27 NOTE — Progress Notes (Signed)
Consult Note: Gyn-Onc   Cheryl Melton 67 y.o. female  Chief Complaint  Patient presents with  . Ovarian cancer on right [facility])    Assessment : Apparent stage III C ovarian carcinoma status post 3 cycles of carboplatin and Taxol neoadjuvant chemotherapy with an excellent resp Subsequently patient underwent interval debulking with an R0 resection and then 3 additional cycles of carboplatin and Taxol.  Last cycle was March 2019.  She is clinically free of disease.  Mild peripheral neuropathy.  Chronic anemia currently taking iron  Plan:  This Ca1 25 and CBC reviewed with the patient.  It is recommended that she again taking vitamin B6 for her mild peripheral neuropathy in her hands and feet.  She return to see Dr. [name] in 3 months return to see me in 6 months..  She is given information regarding warning signs of recurrent disease which she should report if she develops new symptoms.  We will touch base with Dr. Simeon Craft such regarding any further management of the patient's anemia.   Interval history: The patient returns today as previously scheduled.  Since her last visit with me she saw Dr. Simeon Craft such 3 months ago.  Overall she is doing quite well.  Ca1 25 earlier this week was 10.2 units/mL (previously 9 units/mL).  She was placed on iron but over the past 3 months her hemoglobin remains at 10.  She specifically denies any GI GU or pelvic symptoms.  She does have some mild peripheral neuropathy in her hands (fingertips) and soles of her feet.  These are not compromising her overall functional status.  Otherwise, her functional status is good but limited by a compression fracture.  Patient notes that her alopecia has resolved.  HPI: 67 year old white married female gravida  seen in consultation request of Dr.[name] regarding management of a newly diagnosed advanced ovarian cancer. Patient was found to have a pelvic mass on MRI imaging evaluating her back pain (compression fracture of T12)  subsequent ultrasound showed a right adnexal mass measuring 7.4 x 6.7 cm containing both cystic and solid area 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 There is no free fluid or other adnexal masses noted. She underwent laparoscopy on 04/10/2017 with the surprise finding of peritoneal studding, omental cake, and a small amount of ascites. Biopsy of peritoneal implants was obtained but the result is pending at the time of this dictation. No tumor markers have been obtained.  Retrospectively the patient denies any particular symptoms that might been associated with ovarian cancer. She has no family history of ovarian or breast cancer. Her mother had colon cancer. She has no other gynecologic history although she has primary infertility.  Patient was treated with neoadjuvant chemotherapy receiving 3 cycles of carboplatin and Taxol the last being June 05, 2017.  The Ca1 25 fell from 345 units/mL to 27.5 units/mL after 3 cycles.  CT scan after 3 cycles showed significant improvement in her omental cake and a persistent right ovarian mass that has fatty and calcified elements consistent with a dermoid.  She also has a compression fracture at L1.  As planned, the patient underwent interval debulking on July 08, 2017.  This included total abdominal hysterectomy, bilateral salpingo-nephrectomy, infracolic omentectomy.  She had an R0 resection.  Final pathology showed minimal residual carcinoma in her omentum with findings of extensive treatment effect with calcifications fibrosis histiocytes and giant cell response.  The right ovary had focal residual carcinoma consistent with high-grade carcinoma.  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  She had an uncomplicated postoperative course.  She subsequently received 3 additional cycles of carboplatin and Taxol (total of 6 cycles).  Last cycle was administered on September 25, 2017.  At that time her Ca1 25 value was 12.5 units/mL.  A follow-up CT scan showed no  evidence of disease.    Review of Systems:10 point review of systems is negative except as noted in interval history.   Vitals: Blood pressure (!) 116/59, pulse 83, temperature 98.3 F (36.8 C), temperature source Oral, resp. rate 16, height 5\' 7"  (1.702 m), weight 236 lb 3.2 oz (107.1 kg), SpO2 98 %.  Physical Exam: General : The patient is a healthy woman in no acute distress.  HEENT: normocephalic, extraoccular movements normal; neck is supple without thyromegally  Lynphnodes: Supraclavicular and inguinal nodes not enlarged  Abdomen: obese, Soft, non-tender, no ascites, no organomegally, no masses, no hernias, midline incision is completely healed. Pelvic:  EGBUS: Normal female  Vagina: Normal, no lesions  Urethra and Bladder: Normal, non-tender  Cervix: Surgically absent Uterus:   Surgically absent Bi-manual examination: Non-tender; no adenxal masses or nodularity  Rectal: normal sphincter tone, no masses, no blood  Lower extremities: No edema or varicosities. Normal range of motion      Allergies  Allergen Reactions  . Aspirin Other (See Comments)    NSAIDS( advised not to take per hematology)  . Nsaids Other (See Comments)    Gastritis  . Tramadol Other (See Comments)    Headache, vomiting and disorientation     Past Medical History:  Diagnosis Date  . Anxiety   . Cancer ([place])   . Diabetes mellitus without complication ([place])   . Family history of colon cancer   . Family history of prostate cancer   . GERD (gastroesophageal reflux disease)   . Osteoporosis   . Parathyroid adenoma     Past Surgical History:  Procedure Laterality Date  . CHOLECYSTECTOMY    . COLONOSCOPY WITH ESOPHAGOGASTRODUODENOSCOPY (EGD)    . DILATION AND CURETTAGE OF UTERUS    . ELBOW SURGERY    . PARATHYROIDECTOMY  10/21/2016   One side done pt not sure which side.   Marland Kitchen ROTATOR CUFF REPAIR Right   . TONSILLECTOMY      Current Outpatient Medications  Medication Sig Dispense Refill   . acetaminophen (TYLENOL) 650 MG CR tablet Take 1,300 mg by mouth every 8 (eight) hours.     Marland Kitchen alendronate (FOSAMAX) 70 MG tablet Take 70 mg by mouth every Sunday.     Marland Kitchen atorvastatin (LIPITOR) 80 MG tablet Take 40 mg by mouth at bedtime.     . Cholecalciferol (VITAMIN D3) 2000 units TABS Take 2,000 Units by mouth daily.    . citalopram (CELEXA) 10 MG tablet Take 10 mg by mouth at bedtime    . gabapentin (NEURONTIN) 300 MG capsule Take by mouth.    . Glucosamine Sulfate 500 MG TABS Take 500 mg by mouth 2 (two) times daily.     Marland Kitchen loratadine (CLARITIN) 10 MG tablet Take 10 mg by mouth at bedtime.     Marland Kitchen losartan (COZAAR) 25 MG tablet Take 25 mg by mouth daily.     . meclizine (ANTIVERT) 25 MG tablet Take 25 mg by mouth 2 (two) times daily as needed for dizziness.     . metFORMIN (GLUCOPHAGE) 1000 MG tablet Take 1,000 mg by mouth 2 (two) times daily with a meal.     . omeprazole (PRILOSEC)  40 MG capsule Take 40 mg by mouth daily.     . pioglitazone (ACTOS) 15 MG tablet Take 15 mg by mouth at bedtime.     . vitamin B-12 (CYANOCOBALAMIN) 1000 MCG tablet Take 1,000 mcg by mouth daily.      No current facility-administered medications for this visit.    Facility-Administered Medications Ordered in Other Visits  Medication Dose Route Frequency Provider Last Rate Last Dose  . sodium chloride flush (NS) 0.9 % injection 10 mL  10 mL Intracatheter Once Heath Lark, MD        Social History   Socioeconomic History  . Marital status: Married    Spouse name: Mikeal Hawthorne  . Number of children: 0  . Years of education: Not on file  . Highest education level: Not on file  Occupational History  . Occupation: retired  Scientific laboratory technician  . Financial resource strain: Not on file  . Food insecurity:    Worry: Not on file    Inability: Not on file  . Transportation needs:    Medical: Not on file    Non-medical: Not on file  Tobacco Use  . Smoking status: Never Smoker  . Smokeless tobacco: Never Used   Substance and Sexual Activity  . Alcohol use: No  . Drug use: No  . Sexual activity: Not on file  Lifestyle  . Physical activity:    Days per week: Not on file    Minutes per session: Not on file  . Stress: Not on file  Relationships  . Social connections:    Talks on phone: Not on file    Gets together: Not on file    Attends religious service: Not on file    Active member of club or organization: Not on file    Attends meetings of clubs or organizations: Not on file    Relationship status: Not on file  . Intimate partner violence:    Fear of current or ex partner: Not on file    Emotionally abused: Not on file    Physically abused: Not on file    Forced sexual activity: Not on file  Other Topics Concern  . Not on file  Social History Narrative  . Not on file    Family History  Problem Relation Age of Onset  . Colon cancer Mother 46  . Prostate cancer Father 72       metastatic  . Colon cancer Paternal Uncle        age dx unk  . Heart attack Maternal Grandmother   . Aneurysm Maternal Grandfather   . Alzheimer's disease Paternal Grandmother   . Lung cancer Paternal Uncle   . Heart attack Paternal Uncle       [name], MD 04/27/2018, 9:10 AM

## 2018-04-27 NOTE — Patient Instructions (Addendum)
Plan to follow up with Dr. [name] in three months and Dr. Fermin Schwab in six months.  When you are here seeing Dr. [name], we can arrange for your appointment with Dr. C-P since our schedule will be available.  Call our office for any new symptoms such as abdominal bloating, fullness, pain.    Continue taking oral iron per Dr. [name].

## 2018-07-29 ENCOUNTER — Inpatient Hospital Stay: Payer: Medicare Other | Attending: Hematology and Oncology

## 2018-07-29 DIAGNOSIS — Z79899 Other long term (current) drug therapy: Secondary | ICD-10-CM | POA: Diagnosis not present

## 2018-07-29 DIAGNOSIS — Z90710 Acquired absence of both cervix and uterus: Secondary | ICD-10-CM | POA: Insufficient documentation

## 2018-07-29 DIAGNOSIS — Z90721 Acquired absence of ovaries, unilateral: Secondary | ICD-10-CM | POA: Diagnosis not present

## 2018-07-29 DIAGNOSIS — C561 Malignant neoplasm of right ovary: Secondary | ICD-10-CM | POA: Insufficient documentation

## 2018-07-29 DIAGNOSIS — Z9079 Acquired absence of other genital organ(s): Secondary | ICD-10-CM | POA: Insufficient documentation

## 2018-07-29 DIAGNOSIS — D509 Iron deficiency anemia, unspecified: Secondary | ICD-10-CM | POA: Insufficient documentation

## 2018-07-29 DIAGNOSIS — Z7984 Long term (current) use of oral hypoglycemic drugs: Secondary | ICD-10-CM | POA: Insufficient documentation

## 2018-07-30 LAB — CA 125: Cancer Antigen (CA) 125: 14.7 U/mL (ref 0.0–38.1)

## 2018-08-02 ENCOUNTER — Encounter: Payer: Self-pay | Admitting: Hematology and Oncology

## 2018-08-02 ENCOUNTER — Inpatient Hospital Stay (HOSPITAL_BASED_OUTPATIENT_CLINIC_OR_DEPARTMENT_OTHER): Payer: Medicare Other | Admitting: Hematology and Oncology

## 2018-08-02 ENCOUNTER — Telehealth: Payer: Self-pay | Admitting: Hematology and Oncology

## 2018-08-02 VITALS — BP 124/49 | HR 84 | Temp 98.3°F | Resp 18 | Ht 67.0 in | Wt 237.0 lb

## 2018-08-02 DIAGNOSIS — Z9079 Acquired absence of other genital organ(s): Secondary | ICD-10-CM

## 2018-08-02 DIAGNOSIS — Z7984 Long term (current) use of oral hypoglycemic drugs: Secondary | ICD-10-CM

## 2018-08-02 DIAGNOSIS — D509 Iron deficiency anemia, unspecified: Secondary | ICD-10-CM | POA: Diagnosis not present

## 2018-08-02 DIAGNOSIS — Z79899 Other long term (current) drug therapy: Secondary | ICD-10-CM

## 2018-08-02 DIAGNOSIS — C561 Malignant neoplasm of right ovary: Secondary | ICD-10-CM | POA: Diagnosis not present

## 2018-08-02 DIAGNOSIS — Z90710 Acquired absence of both cervix and uterus: Secondary | ICD-10-CM | POA: Diagnosis not present

## 2018-08-02 DIAGNOSIS — Z90721 Acquired absence of ovaries, unilateral: Secondary | ICD-10-CM

## 2018-08-02 NOTE — Assessment & Plan Note (Signed)
She has history of iron deficiency anemia and is doing well on oral iron supplement Unfortunately, iron studies are not done recently I recommend her primary care doctor to check her iron studies in her next visit and I plan to check it again in April before she sees her GYN oncologist

## 2018-08-02 NOTE — Progress Notes (Signed)
[place] OFFICE PROGRESS NOTE  Patient Care Team: Lilian Coma., MD as PCP - General (Internal Medicine)  ASSESSMENT & PLAN:  Ovarian cancer on right [facility]) She has no signs or symptoms to suggest cancer recurrence Her tumor marker is stable I recommend long-term follow-up with GYN oncologist since she is not getting pelvic exam here in my office  Iron deficiency anemia She has history of iron deficiency anemia and is doing well on oral iron supplement Unfortunately, iron studies are not done recently I recommend her primary care doctor to check her iron studies in her next visit and I plan to check it again in April before she sees her GYN oncologist   Orders Placed This Encounter  Procedures  . Comprehensive metabolic panel    Standing Status:   Future    Standing Expiration Date:   09/06/2019  . CBC with Differential/Platelet    Standing Status:   Future    Standing Expiration Date:   09/06/2019  . Ferritin    Standing Status:   Future    Standing Expiration Date:   08/02/2019  . Iron and TIBC    Standing Status:   Future    Standing Expiration Date:   09/06/2019  . CA 125    Standing Status:   Future    Standing Expiration Date:   09/06/2019    INTERVAL HISTORY: Please see below for problem oriented charting. She returns for further follow-up with her sister She feels well She is taking oral iron supplement for iron deficiency anemia Denies nausea, abdominal bloating, changes in bowel habits or pelvic pain. Her peripheral neuropathy is improving  SUMMARY OF ONCOLOGIC HISTORY: Oncology History   Genetic test on tissue sample did not show BRCA mutation     Ovarian cancer on right ([place])   11/12/2016 Imaging    Outside MRI imaging evaluating her back pain (compression fracture of T12) subsequent ultrasound showed a right adnexal mass measuring 7.4 x 6.7 cm containing both cystic and solid area 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     03/20/2017 Imaging    GYN  ultrasound was performed this revealed a small fundus measuring 5 cm there are 2 small fibroids within the fundus with the largest measuring 1.3 cm Within the endometrium there is a small amount a fluid the endometrial wall was slightly thickened at 4.8 mm the ultrasound was consistent with a small endometrial polyp  The right adnexa measured 7.4 x 6.7 cm contained a both a cystic and solid mass with a small amount of shadowing This is consistent with a dermoid tumor the left ovary was 2.6 x 1.5 x 1.0 cm There is no other adnexal masses no free fluid within the abdomen no ascites        04/10/2017 Surgery    Operation: Operative Laparscopy with  Peritoneal biopsy 2 hysteroscopy D&C Surgeon: Lynder Parents MD  Specimens: Peritoneal biopsy and cellular washings  Complications: None FIndings Small amount of ascites was found cellular washings were sent There was peritoneal studding And omental caking Even in steep Trendelenburg the ovaries and uterus were unable to be visualized due to the small intestines in the lower pelvis        04/10/2017 Pathology Results    Biopsy from [facility] was nondiagnostic.  Peritoneum biopsy did not reveal malignancy.  Endometrial biopsy also did not reveal malignancy.    04/13/2017 Tumor Marker    Patient's tumor was tested for  the following markers: CA-125 Results of the tumor marker test revealed 324.1    04/20/2017 Tumor Marker    Patient's tumor was tested for the following markers: CA-125 Results of the tumor marker test revealed 345.6    04/22/2017 Pathology Results    Peritoneum, biopsy - CARCINOMA. - SEE COMMENT.    04/22/2017 Procedure    CT-guided core biopsy performed of peritoneal tumor in the left lower quadrant.    04/24/2017 -  Chemotherapy    She received carboplatin and Taxol x 3 cycles followed by interval surgical debulking on 12/31. Chemotherapy was subsequently resumed for 3 more cycles     06/05/2017 Tumor Marker    Patient's tumor was tested for the following markers: CA-125 Results of the tumor marker test revealed 27.5    06/17/2017 Imaging    1. Considerable improvement in the prior omental caking, which now presents mainly as an indistinct stranding with slight nodularity along the left upper quadrant omentum. 2. Stable appearance of right ovarian mass with fatty and calcific elements classic for a dermoid. 3. There is accentuated enhancement in the walls of the common hepatic duct and common bile duct. A low-grade cholangitis is not readily excluded. 4. Other imaging findings of potential clinical significance: Mild cardiomegaly. Aortic Atherosclerosis (ICD10-I70.0). Lumbar scoliosis, spondylosis, degenerative disc disease, and pars defects at L5. These cause impingement at L5-S1 and lesser impingement at L3-4 and L4-5. 5. 65% compression fracture at L1 slightly worsened than on the prior exam, with associated vertebral sclerosis and posterior retropulsion.    07/06/2017 Surgery    Bilateral Salpingo-Oophorectomy W/Omentectomy, Total Abd Hysterectomy & Radical Dissection For Debulking Right - Ureterolysis, With Or Without Repositioning Of Ureter For Retroperitoneal Fibrosis        07/06/2017 Pathology Results    A: Omentum, omentectomy - Omentum with minimal residual carcinoma, consistent with high grade serous carcinoma (microscopic; stage ypT3a) - Extensive treatment effect with calcifications, fibrosis, histiocytes, and giant cell response - CRS score 3  B: Uterus with cervix and left ovary and fallopian tube, hysterectomy and left salpingo-oophorectomy - Cervix: Ectocervix and endocervix with no dysplasia or malignancy identified - Endometrium: Inactive with no hyperplasia, atypia, or malignancy identified - Myometrium: Adenomyosis and benign leiomyoma, size 1.2 cm - Serosa: Focal calcification and histiocytes suggestive of treatment effect, with no viable  carcinoma identified - Ovary, left: Physiologic changes and no carcinoma identified - Fallopian tube, left: 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 and no malignancy identified  C: Ovary and fallopian tube, right, salpingo-oophorectomy - Right fallopian tube with focal residual carcinoma, consistent with high grade serous carcinoma, and serous tubal intraepithelial carcinoma (STIC) - Associated calcifications and histiocytes suggestive of treatment response - Right ovary with calcifications and histiocytes suggestive of treatment effect, with no definite viable carcinoma identified - Mature cystic teratoma, 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 - See synoptic report and comment    08/14/2017 Tumor Marker    Patient's tumor was tested for the following markers: CA-125 Results of the tumor marker test revealed 14.6    09/04/2017 Tumor Marker    Patient's tumor was tested for the following markers: CA-125 Results of the tumor marker test revealed 12.5    10/03/2017 Genetic Testing    The Common Hereditary Cancer Panel offered by [facility] includes sequencing and/or deletion duplication testing of the following 47 genes: APC, ATM, AXIN2, BARD1, BMPR1A, BRCA1, BRCA2, BRIP1, CDH1, CDKN2A (p14ARF), CDKN2A (p16INK4a), CKD4, CHEK2, CTNNA1, DICER1, EPCAM (Deletion/duplication testing only), GREM1 (promoter region deletion/duplication testing only),  KIT, MEN1, MLH1, MSH2, MSH3, MSH6, MUTYH, NBN, NF1, NHTL1, PALB2, PDGFRA, PMS2, POLD1, POLE, PTEN, RAD50, RAD51C, RAD51D, SDHB, SDHC, SDHD, SMAD4, SMARCA4. STK11, TP53, TSC1, TSC2, and VHL.  The following genes were evaluated for sequence changes only: SDHA and HOXB13 c.251G>A variant only.  Results: Negative, no pathogenic variants identified.  The date of this test report is 10/03/2017.     10/28/2017 Imaging    1. Stable pulmonary nodules. No new or progressive findings. 2. No residual or recurrent omental disease is identified. No obvious peritoneal surface disease. 3.  Increasing soft tissue density and ill-defined interstitial changes around the abdominal wall surgical incision. This may be progressive postoperative changes or granulation tissue but attention on future scans to exclude the possibility of tumor. 4. Status post resection of right adnexal mass. No pelvic mass or pelvic adenopathy. 5. Stable T12 compression fracture.    01/25/2018 Tumor Marker    Patient's tumor was tested for the following markers: CA-125 Results of the tumor marker test revealed 9.2     REVIEW OF SYSTEMS:   Constitutional: Denies fevers, chills or abnormal weight loss Eyes: Denies blurriness of vision Ears, nose, mouth, throat, and face: Denies mucositis or sore throat Respiratory: Denies cough, dyspnea or wheezes Cardiovascular: Denies palpitation, chest discomfort or lower extremity swelling Gastrointestinal:  Denies nausea, heartburn or change in bowel habits Skin: Denies abnormal skin rashes Lymphatics: Denies new lymphadenopathy or easy bruising Neurological:Denies numbness, tingling or new weaknesses Behavioral/Psych: Mood is stable, no new changes  All other systems were reviewed with the patient and are negative.  I have reviewed the past medical history, past surgical history, social history and family history with the patient and they are unchanged from previous note.  ALLERGIES:  is allergic to aspirin; nsaids; and tramadol.  MEDICATIONS:  Current Outpatient Medications  Medication Sig Dispense Refill  . acetaminophen (TYLENOL) 650 MG CR tablet Take 1,300 mg by mouth every 8 (eight) hours.     Marland Kitchen alendronate (FOSAMAX) 70 MG tablet Take 70 mg by mouth every Sunday.     Marland Kitchen atorvastatin (LIPITOR) 80 MG tablet Take 40 mg by mouth at bedtime.     . Cholecalciferol (VITAMIN D3) 2000 units TABS Take 2,000 Units by mouth daily.    . citalopram (CELEXA) 10 MG tablet Take 10 mg by mouth at bedtime    . gabapentin (NEURONTIN) 300 MG capsule Take by mouth.    .  Glucosamine Sulfate 500 MG TABS Take 500 mg by mouth 2 (two) times daily.     Marland Kitchen loratadine (CLARITIN) 10 MG tablet Take 10 mg by mouth at bedtime.     Marland Kitchen losartan (COZAAR) 25 MG tablet Take 25 mg by mouth daily.     . meclizine (ANTIVERT) 25 MG tablet Take 25 mg by mouth 2 (two) times daily as needed for dizziness.     . metFORMIN (GLUCOPHAGE) 1000 MG tablet Take 1,000 mg by mouth 2 (two) times daily with a meal.     . omeprazole (PRILOSEC) 40 MG capsule Take 40 mg by mouth daily.     . pioglitazone (ACTOS) 15 MG tablet Take 15 mg by mouth at bedtime.     . vitamin B-12 (CYANOCOBALAMIN) 1000 MCG tablet Take 1,000 mcg by mouth daily.      No current facility-administered medications for this visit.    Facility-Administered Medications Ordered in Other Visits  Medication Dose Route Frequency Provider Last Rate Last Dose  . sodium chloride flush (NS) 0.9 % injection  10 mL  10 mL Intracatheter Once Heath Lark, MD        PHYSICAL EXAMINATION: ECOG PERFORMANCE STATUS: 1 - Symptomatic but completely ambulatory  Vitals:   08/02/18 0841  BP: (!) 124/49  Pulse: 84  Resp: 18  Temp: 98.3 F (36.8 C)  SpO2: 98%   Filed Weights   08/02/18 0841  Weight: 237 lb (107.5 kg)    GENERAL:alert, no distress and comfortable SKIN: skin color, texture, turgor are normal, no rashes or significant lesions EYES: normal, Conjunctiva are pink and non-injected, sclera clear OROPHARYNX:no exudate, no erythema and lips, buccal mucosa, and tongue normal  NECK: supple, thyroid normal size, non-tender, without nodularity LYMPH:  no palpable lymphadenopathy in the cervical, axillary or inguinal LUNGS: clear to auscultation and percussion with normal breathing effort HEART: regular rate & rhythm and no murmurs and no lower extremity edema ABDOMEN:abdomen soft, non-tender and normal bowel sounds Musculoskeletal:no cyanosis of digits and no clubbing  NEURO: alert & oriented x 3 with fluent speech, no focal  motor/sensory deficits  LABORATORY DATA:  I have reviewed the data as listed    Component Value Date/Time   NA 138 04/22/2018 1147   NA 139 06/05/2017 0746   K 4.4 04/22/2018 1147   K 4.1 06/05/2017 0746   CL 103 04/22/2018 1147   CO2 27 04/22/2018 1147   CO2 21 (L) 06/05/2017 0746   GLUCOSE 176 (H) 04/22/2018 1147   GLUCOSE 279 (H) 06/05/2017 0746   BUN 18 04/22/2018 1147   BUN 15.0 06/05/2017 0746   CREATININE 0.82 04/22/2018 1147   CREATININE 0.9 06/05/2017 0746   CALCIUM 9.5 04/22/2018 1147   CALCIUM 10.1 06/05/2017 0746   PROT 7.6 04/22/2018 1147   PROT 8.1 06/05/2017 0746   ALBUMIN 3.8 04/22/2018 1147   ALBUMIN 4.0 06/05/2017 0746   AST 21 04/22/2018 1147   AST 22 06/05/2017 0746   ALT 27 04/22/2018 1147   ALT 36 06/05/2017 0746   ALKPHOS 93 04/22/2018 1147   ALKPHOS 98 06/05/2017 0746   BILITOT 0.3 04/22/2018 1147   BILITOT 0.44 06/05/2017 0746   GFRNONAA >60 04/22/2018 1147   GFRAA >60 04/22/2018 1147    No results found for: SPEP, UPEP  Lab Results  Component Value Date   WBC 4.4 04/22/2018   NEUTROABS 2.7 04/22/2018   HGB 10.3 (L) 04/22/2018   HCT 33.4 (L) 04/22/2018   MCV 96.8 04/22/2018   PLT 231 04/22/2018      Chemistry      Component Value Date/Time   NA 138 04/22/2018 1147   NA 139 06/05/2017 0746   K 4.4 04/22/2018 1147   K 4.1 06/05/2017 0746   CL 103 04/22/2018 1147   CO2 27 04/22/2018 1147   CO2 21 (L) 06/05/2017 0746   BUN 18 04/22/2018 1147   BUN 15.0 06/05/2017 0746   CREATININE 0.82 04/22/2018 1147   CREATININE 0.9 06/05/2017 0746      Component Value Date/Time   CALCIUM 9.5 04/22/2018 1147   CALCIUM 10.1 06/05/2017 0746   ALKPHOS 93 04/22/2018 1147   ALKPHOS 98 06/05/2017 0746   AST 21 04/22/2018 1147   AST 22 06/05/2017 0746   ALT 27 04/22/2018 1147   ALT 36 06/05/2017 0746   BILITOT 0.3 04/22/2018 1147   BILITOT 0.44 06/05/2017 0746       All questions were answered. The patient knows to call the clinic with any  problems, questions or concerns. No barriers to learning was detected.  I spent 10 minutes counseling the patient face to face. The total time spent in the appointment was 15 minutes and more than 50% was on counseling and review of test results  Heath Lark, MD 08/02/2018 10:57 AM

## 2018-08-02 NOTE — Assessment & Plan Note (Signed)
She has no signs or symptoms to suggest cancer recurrence Her tumor marker is stable I recommend long-term follow-up with GYN oncologist since she is not getting pelvic exam here in my office

## 2018-08-03 ENCOUNTER — Ambulatory Visit: Payer: Medicare Other | Admitting: Gynecology

## 2018-09-20 ENCOUNTER — Telehealth: Payer: Self-pay | Admitting: *Deleted

## 2018-09-20 NOTE — Telephone Encounter (Signed)
"  Cheryl Melton [phone]).  Calling to ask what Dr. [name] and Dr. Fermin Schwab want me to do about going out.  I go to church and I'm to see Dr. Fermin Schwab in April and there are more than a 75 people"   Conveyed [facility] following preventative precautions screening patients.   With CDC recommendation to postpone or cancelling gatherings of one hundred or more has been reduced to fifty.  Churches are following these recommendations.

## 2018-09-21 ENCOUNTER — Telehealth: Payer: Self-pay | Admitting: Hematology and Oncology

## 2018-09-21 NOTE — Telephone Encounter (Signed)
Called patient per 3/16 VM scheduling log.  Patient decided to keep her lab date and time.

## 2018-09-30 ENCOUNTER — Telehealth: Payer: Self-pay | Admitting: *Deleted

## 2018-09-30 NOTE — Telephone Encounter (Signed)
Called and spoke with the patient regarding her appt on 4/14. Explained that the appt needs to be reschedule, due to hospital asking clinics to reschedule all routine appts. Appt moved to 5/12 and labs to 5/8. Explained that she may receive a call the day before to pre-screen her and again the day of the appt at the front desk. Also explained the no visitor policy

## 2018-10-15 ENCOUNTER — Other Ambulatory Visit: Payer: Medicare Other

## 2018-10-19 ENCOUNTER — Encounter: Payer: Self-pay | Admitting: Hematology and Oncology

## 2018-10-19 ENCOUNTER — Ambulatory Visit: Payer: Medicare Other | Admitting: Gynecology

## 2018-11-03 ENCOUNTER — Telehealth: Payer: Self-pay | Admitting: Gynecologic Oncology

## 2018-11-03 NOTE — Telephone Encounter (Signed)
Called patient to check on status. States she is having no symptoms such as abdominal bloating, early satiety, abdominal pain, change in weight, change in appetite/bowel habits, etc. She has a scheduled appt on May 12 that needs to be rescheduled due to [place] and given no symptoms, she is requesting to move her appt to June 9. She does not want to come in for her CA 125 lab draw either due to risk of COVID. No concerns voiced. She declined a telephone visit with Dr. Judeth Porch on May 12 bc she would like to have the pelvic exam component. Advised to call for any needs or concerns.

## 2018-11-12 ENCOUNTER — Other Ambulatory Visit: Payer: Medicare Other

## 2018-11-16 ENCOUNTER — Ambulatory Visit: Payer: Medicare Other | Admitting: Gynecology

## 2018-11-17 ENCOUNTER — Telehealth: Payer: Self-pay | Admitting: *Deleted

## 2018-11-17 NOTE — Telephone Encounter (Signed)
Called and spoke with the patient regarding her appt on 6/9, patient wishes to keep her appt and change it to a phone visit

## 2018-11-30 ENCOUNTER — Telehealth: Payer: Self-pay | Admitting: Gynecologic Oncology

## 2018-11-30 NOTE — Telephone Encounter (Signed)
Returned call to patient.  All questions answered.  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 She will wear a mask and wash her hands.  Advised to take precautions and call for any needs.

## 2018-12-10 ENCOUNTER — Inpatient Hospital Stay: Payer: Medicare Other | Attending: Gynecology

## 2018-12-10 ENCOUNTER — Other Ambulatory Visit: Payer: Self-pay

## 2018-12-10 DIAGNOSIS — R180 Malignant ascites: Secondary | ICD-10-CM | POA: Diagnosis not present

## 2018-12-10 DIAGNOSIS — C561 Malignant neoplasm of right ovary: Secondary | ICD-10-CM | POA: Diagnosis present

## 2018-12-10 DIAGNOSIS — Z5111 Encounter for antineoplastic chemotherapy: Secondary | ICD-10-CM | POA: Diagnosis present

## 2018-12-10 DIAGNOSIS — E119 Type 2 diabetes mellitus without complications: Secondary | ICD-10-CM | POA: Insufficient documentation

## 2018-12-10 DIAGNOSIS — G620 Drug-induced polyneuropathy: Secondary | ICD-10-CM | POA: Insufficient documentation

## 2018-12-10 DIAGNOSIS — D509 Iron deficiency anemia, unspecified: Secondary | ICD-10-CM | POA: Diagnosis not present

## 2018-12-10 LAB — IRON AND TIBC
Iron: 67 ug/dL (ref 41–142)
Saturation Ratios: 22 % (ref 21–57)
TIBC: 306 ug/dL (ref 236–444)
UIBC: 240 ug/dL (ref 120–384)

## 2018-12-10 LAB — CBC WITH DIFFERENTIAL/PLATELET
Abs Immature Granulocytes: 0.01 10*3/uL (ref 0.00–0.07)
Basophils Absolute: 0 10*3/uL (ref 0.0–0.1)
Basophils Relative: 1 %
Eosinophils Absolute: 0.1 10*3/uL (ref 0.0–0.5)
Eosinophils Relative: 2 %
HCT: 37.9 % (ref 36.0–46.0)
Hemoglobin: 11.7 g/dL — ABNORMAL LOW (ref 12.0–15.0)
Immature Granulocytes: 0 %
Lymphocytes Relative: 22 %
Lymphs Abs: 1 10*3/uL (ref 0.7–4.0)
MCH: 29.6 pg (ref 26.0–34.0)
MCHC: 30.9 g/dL (ref 30.0–36.0)
MCV: 95.9 fL (ref 80.0–100.0)
Monocytes Absolute: 0.3 10*3/uL (ref 0.1–1.0)
Monocytes Relative: 6 %
Neutro Abs: 3.1 10*3/uL (ref 1.7–7.7)
Neutrophils Relative %: 69 %
Platelets: 286 10*3/uL (ref 150–400)
RBC: 3.95 MIL/uL (ref 3.87–5.11)
RDW: 14.6 % (ref 11.5–15.5)
WBC: 4.6 10*3/uL (ref 4.0–10.5)
nRBC: 0 % (ref 0.0–0.2)

## 2018-12-10 LAB — COMPREHENSIVE METABOLIC PANEL
ALT: 27 U/L (ref 0–44)
AST: 27 U/L (ref 15–41)
Albumin: 3.8 g/dL (ref 3.5–5.0)
Alkaline Phosphatase: 62 U/L (ref 38–126)
Anion gap: 11 (ref 5–15)
BUN: 13 mg/dL (ref 8–23)
CO2: 26 mmol/L (ref 22–32)
Calcium: 8.8 mg/dL — ABNORMAL LOW (ref 8.9–10.3)
Chloride: 102 mmol/L (ref 98–111)
Creatinine, Ser: 0.83 mg/dL (ref 0.44–1.00)
GFR calc Af Amer: >60 mL/min (ref >60)
GFR calc non Af Amer: >60 mL/min (ref >60)
Glucose, Bld: 160 mg/dL — ABNORMAL HIGH (ref 70–99)
Potassium: 3.9 mmol/L (ref 3.5–5.1)
Sodium: 139 mmol/L (ref 135–145)
Total Bilirubin: 0.4 mg/dL (ref 0.3–1.2)
Total Protein: 7.5 g/dL (ref 6.5–8.1)

## 2018-12-10 LAB — FERRITIN: Ferritin: 108 ng/mL (ref 11–307)

## 2018-12-11 LAB — CA 125: Cancer Antigen (CA) 125: 212 U/mL — ABNORMAL HIGH (ref 0.0–38.1)

## 2018-12-14 ENCOUNTER — Other Ambulatory Visit: Payer: Self-pay | Admitting: Gynecologic Oncology

## 2018-12-14 ENCOUNTER — Encounter: Payer: Self-pay | Admitting: Gynecology

## 2018-12-14 ENCOUNTER — Inpatient Hospital Stay (HOSPITAL_BASED_OUTPATIENT_CLINIC_OR_DEPARTMENT_OTHER): Payer: Medicare Other | Admitting: Gynecology

## 2018-12-14 DIAGNOSIS — Z90710 Acquired absence of both cervix and uterus: Secondary | ICD-10-CM

## 2018-12-14 DIAGNOSIS — C561 Malignant neoplasm of right ovary: Secondary | ICD-10-CM | POA: Diagnosis not present

## 2018-12-14 DIAGNOSIS — R971 Elevated cancer antigen 125 [CA 125]: Secondary | ICD-10-CM

## 2018-12-14 DIAGNOSIS — Z90722 Acquired absence of ovaries, bilateral: Secondary | ICD-10-CM | POA: Diagnosis not present

## 2018-12-14 DIAGNOSIS — Z9221 Personal history of antineoplastic chemotherapy: Secondary | ICD-10-CM

## 2018-12-14 NOTE — Progress Notes (Signed)
Consult Note: Gyn-Onc   Cheryl Melton 68 y.o. female  No chief complaint on file.  Virtual Visit via Telephone Note  I connected with Cheryl Melton on 12/14/18 at 12:15 PM EDT by telephone and verified that I am speaking with the correct person using two identifiers.  Location: Patient: home.  Sister on second phone line from her home Provider: Gaspar Cola   I discussed the limitations, risks, security and privacy concerns of performing an evaluation and management service by telephone and the availability of in person appointments. I also discussed with the patient that there may be a patient responsible charge related to this service. The patient expressed understanding and agreed to proceed.  I discussed the assessment and treatment plan with the patient. The patient was provided an opportunity to ask questions and all were answered. The patient agreed with the plan and demonstrated an understanding of the instructions.   The patient was advised to call back or seek an in-person evaluation if the symptoms worsen or if the condition fails to improve as anticipated.  I provided 25 minutes of non-face-to-face time during this encounter.   Cheryl Sleigh, MD  Assessment : Apparent stage III C ovarian carcinoma status post 3 cycles of carboplatin and Taxol neoadjuvant chemotherapy with an excellent response.  Subsequently patient underwent interval debulking with an R0 resection and then 3 additional cycles of carboplatin and Taxol.  Last cycle was March 2019.   Rising CA125 and early satiety concerning for recurrent disease  Mild peripheral neuropathy. (stable on B6 and gabapentin)  Chronic anemia currently taking iron.  Improved now with Hbg of 11.7  Plan:  This Ca1 25 and CBC reviewed with the patient and her sister.  I indicated that it is likely she has recurrent disease.  We will obtain a CT of abdomen and pelvis to reassess.  I indicated that in most cases a return to a  chemotherapy regimen would be most likely.  She appears to be platin sensitive.   Interval history: The patient returns today as previously scheduled for a phone visit.  Since her last visit with me she saw Dr. [name] 3 months ago.  Overall she is doing quite well.  She denies any GI, GU or pelvic symptoms except for early satiety/feeling "full".   It is noted that her CA 125 is now 212 u/ml  (previously 14).  .  She does have some mild peripheral neuropathy in her hands (fingertips) and soles of her feet that is stable.  These are not compromising her overall functional status.  Otherwise, her functional status is good but limited by a compression fracture.  HPI: 68 year old white married female gravida  seen in consultation request of Dr.[name] regarding management of a newly diagnosed advanced ovarian cancer. 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 There is no free fluid or other adnexal masses noted. She underwent laparoscopy on 04/10/2017 with the surprise finding of peritoneal studding, omental cake, and a small amount of ascites. Biopsy of peritoneal implants was obtained but the result is pending at the time of this dictation. No tumor markers have been obtained.  Retrospectively the patient denies any particular symptoms that might been associated with ovarian cancer. She has no family history of ovarian or breast cancer. Her mother had  colon cancer. She has no other gynecologic history although she has primary infertility.  Patient was treated with neoadjuvant chemotherapy receiving 3 cycles of carboplatin and Taxol the last being June 05, 2017.  The Ca1 25 fell from 345 units/mL to 27.5 units/mL after 3 cycles.  CT scan after 3 cycles showed significant improvement in her omental  cake and a persistent right ovarian mass that has fatty and calcified elements consistent with a dermoid.  She also has a compression fracture at L1.  As planned, the patient underwent interval debulking on July 08, 2017.  This included total abdominal hysterectomy, bilateral salpingo-nephrectomy, infracolic omentectomy.  She had an R0 resection.  Final pathology showed minimal residual carcinoma in her omentum with findings of extensive treatment effect with calcifications fibrosis histiocytes and giant cell response.  The right ovary had focal residual carcinoma consistent with high-grade carcinoma.  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  She had an uncomplicated postoperative course.  She subsequently received 3 additional cycles of carboplatin and Taxol (total of 6 cycles).  Last cycle was administered on September 25, 2017.  At that time her Ca1 25 value was 12.5 units/mL.  A follow-up CT scan showed no evidence of disease.  She was followed until June 2020 when the CA125 increased to 212 u/ml.    Review of Systems:10 point review of systems is negative except as noted in interval history.   Vitals: There were no vitals taken for this visit.  Physical Exam: Not performed:  Phone visit  Allergies  Allergen Reactions  . Aspirin Other (See Comments)    NSAIDS( advised not to take per hematology)  . Nsaids Other (See Comments)    Gastritis  . Tramadol Other (See Comments)    Headache, vomiting and disorientation     Past Medical History:  Diagnosis Date  . Anxiety   . Cancer ([place])   . Diabetes mellitus without complication ([place])   . Family history of colon cancer   . Family history of prostate cancer   . GERD (gastroesophageal reflux disease)   . Osteoporosis   . Parathyroid adenoma     Past Surgical History:  Procedure Laterality Date  . CHOLECYSTECTOMY    . COLONOSCOPY WITH ESOPHAGOGASTRODUODENOSCOPY (EGD)    . DILATION AND CURETTAGE OF UTERUS    . ELBOW  SURGERY    . PARATHYROIDECTOMY  10/21/2016   One side done pt not sure which side.   Marland Kitchen ROTATOR CUFF REPAIR Right   . TONSILLECTOMY      Current Outpatient Medications  Medication Sig Dispense Refill  . acetaminophen (TYLENOL) 650 MG CR tablet Take 1,300 mg by mouth every 8 (eight) hours.     Marland Kitchen alendronate (FOSAMAX) 70 MG tablet Take 70 mg by mouth every Sunday.     Marland Kitchen atorvastatin (LIPITOR) 80 MG tablet Take 40 mg by mouth at bedtime.     . Cholecalciferol (VITAMIN D3) 2000 units TABS Take 2,000 Units by mouth daily.    . citalopram (CELEXA) 10 MG tablet Take 10 mg by mouth at bedtime    . gabapentin (NEURONTIN) 300 MG capsule Take by mouth.    . Glucosamine Sulfate 500 MG TABS Take 500 mg by mouth 2 (two) times daily.     Marland Kitchen loratadine (CLARITIN) 10 MG tablet Take 10 mg by mouth at bedtime.     Marland Kitchen losartan (COZAAR) 25 MG tablet Take 25 mg by mouth daily.     . meclizine (ANTIVERT)  25 MG tablet Take 25 mg by mouth 2 (two) times daily as needed for dizziness.     . metFORMIN (GLUCOPHAGE) 1000 MG tablet Take 1,000 mg by mouth 2 (two) times daily with a meal.     . omeprazole (PRILOSEC) 40 MG capsule Take 40 mg by mouth daily.     . pioglitazone (ACTOS) 15 MG tablet Take 15 mg by mouth at bedtime.     . vitamin B-12 (CYANOCOBALAMIN) 1000 MCG tablet Take 1,000 mcg by mouth daily.      No current facility-administered medications for this visit.    Facility-Administered Medications Ordered in Other Visits  Medication Dose Route Frequency Provider Last Rate Last Dose  . sodium chloride flush (NS) 0.9 % injection 10 mL  10 mL Intracatheter Once Heath Lark, MD        Social History   Socioeconomic History  . Marital status: Married    Spouse name: Mikeal Hawthorne  . Number of children: 0  . Years of education: Not on file  . Highest education level: Not on file  Occupational History  . Occupation: retired  Scientific laboratory technician  . Financial resource strain: Not on file  . Food insecurity:    Worry:  Not on file    Inability: Not on file  . Transportation needs:    Medical: Not on file    Non-medical: Not on file  Tobacco Use  . Smoking status: Never Smoker  . Smokeless tobacco: Never Used  Substance and Sexual Activity  . Alcohol use: No  . Drug use: No  . Sexual activity: Not on file  Lifestyle  . Physical activity:    Days per week: Not on file    Minutes per session: Not on file  . Stress: Not on file  Relationships  . Social connections:    Talks on phone: Not on file    Gets together: Not on file    Attends religious service: Not on file    Active member of club or organization: Not on file    Attends meetings of clubs or organizations: Not on file    Relationship status: Not on file  . Intimate partner violence:    Fear of current or ex partner: Not on file    Emotionally abused: Not on file    Physically abused: Not on file    Forced sexual activity: Not on file  Other Topics Concern  . Not on file  Social History Narrative  . Not on file    Family History  Problem Relation Age of Onset  . Colon cancer Mother 9  . Prostate cancer Father 52       metastatic  . Colon cancer Paternal Uncle        age dx unk  . Heart attack Maternal Grandmother   . Aneurysm Maternal Grandfather   . Alzheimer's disease Paternal Grandmother   . Lung cancer Paternal Uncle   . Heart attack Paternal Uncle       Cheryl Sleigh, MD 12/14/2018, 1:06 PM

## 2018-12-14 NOTE — Patient Instructions (Signed)
We will schedule a CT scan to evaluate the rising CA125.

## 2018-12-15 ENCOUNTER — Other Ambulatory Visit: Payer: Self-pay | Admitting: Hematology and Oncology

## 2018-12-15 ENCOUNTER — Telehealth: Payer: Self-pay | Admitting: Oncology

## 2018-12-15 DIAGNOSIS — C561 Malignant neoplasm of right ovary: Secondary | ICD-10-CM

## 2018-12-15 NOTE — Telephone Encounter (Signed)
Cheryl Melton with appointment to see Dr. [name] on 12/23/18 at 11:15.  She verbalized agreement.  Also discussed getting a port a cath for chemotherapy and she is agreeable to having it scheduled.

## 2018-12-22 ENCOUNTER — Other Ambulatory Visit: Payer: Self-pay

## 2018-12-22 ENCOUNTER — Encounter (HOSPITAL_COMMUNITY): Payer: Self-pay

## 2018-12-22 ENCOUNTER — Ambulatory Visit (HOSPITAL_COMMUNITY)
Admission: RE | Admit: 2018-12-22 | Discharge: 2018-12-22 | Disposition: A | Discharge status: Home / Self Care | Payer: Medicare Other | Source: Ambulatory Visit | Attending: Gynecologic Oncology | Admitting: Gynecologic Oncology

## 2018-12-22 DIAGNOSIS — C561 Malignant neoplasm of right ovary: Secondary | ICD-10-CM | POA: Insufficient documentation

## 2018-12-22 DIAGNOSIS — R971 Elevated cancer antigen 125 [CA 125]: Secondary | ICD-10-CM | POA: Diagnosis present

## 2018-12-22 MED ORDER — SODIUM CHLORIDE (PF) 0.9 % IJ SOLN
INTRAMUSCULAR | Status: AC
  Filled 2018-12-22: qty 50

## 2018-12-22 MED ORDER — IOHEXOL 300 MG/ML  SOLN
100.0000 mL | Freq: Once | INTRAMUSCULAR | Status: AC | PRN
  Administered 2018-12-22: 100 mL via INTRAVENOUS

## 2018-12-23 ENCOUNTER — Inpatient Hospital Stay (HOSPITAL_BASED_OUTPATIENT_CLINIC_OR_DEPARTMENT_OTHER): Payer: Medicare Other | Admitting: Hematology and Oncology

## 2018-12-23 ENCOUNTER — Encounter: Payer: Self-pay | Admitting: Hematology and Oncology

## 2018-12-23 ENCOUNTER — Other Ambulatory Visit: Payer: Self-pay | Admitting: Hematology and Oncology

## 2018-12-23 ENCOUNTER — Telehealth: Payer: Self-pay | Admitting: Oncology

## 2018-12-23 ENCOUNTER — Other Ambulatory Visit: Payer: Self-pay

## 2018-12-23 VITALS — BP 120/85 | HR 92 | Temp 98.2°F | Resp 18 | Ht 67.0 in | Wt 241.2 lb

## 2018-12-23 DIAGNOSIS — D509 Iron deficiency anemia, unspecified: Secondary | ICD-10-CM

## 2018-12-23 DIAGNOSIS — C561 Malignant neoplasm of right ovary: Secondary | ICD-10-CM | POA: Diagnosis not present

## 2018-12-23 DIAGNOSIS — Z7189 Other specified counseling: Secondary | ICD-10-CM | POA: Insufficient documentation

## 2018-12-23 DIAGNOSIS — T451X5A Adverse effect of antineoplastic and immunosuppressive drugs, initial encounter: Secondary | ICD-10-CM

## 2018-12-23 DIAGNOSIS — E119 Type 2 diabetes mellitus without complications: Secondary | ICD-10-CM

## 2018-12-23 DIAGNOSIS — R180 Malignant ascites: Secondary | ICD-10-CM | POA: Diagnosis not present

## 2018-12-23 DIAGNOSIS — G620 Drug-induced polyneuropathy: Secondary | ICD-10-CM

## 2018-12-23 DIAGNOSIS — Z5111 Encounter for antineoplastic chemotherapy: Secondary | ICD-10-CM | POA: Diagnosis not present

## 2018-12-23 MED ORDER — ONDANSETRON HCL 8 MG PO TABS: 8.0000 mg | ORAL_TABLET | Freq: Three times a day (TID) | ORAL | 1 refills | Status: DC | PRN

## 2018-12-23 MED ORDER — LIDOCAINE-PRILOCAINE 2.5-2.5 % EX CREA: TOPICAL_CREAM | CUTANEOUS | 3 refills | Status: AC

## 2018-12-23 MED ORDER — PROCHLORPERAZINE MALEATE 10 MG PO TABS: 10.0000 mg | ORAL_TABLET | Freq: Four times a day (QID) | ORAL | 1 refills | Status: DC | PRN

## 2018-12-23 MED ORDER — DEXAMETHASONE 4 MG PO TABS: ORAL_TABLET | ORAL | 0 refills | Status: DC

## 2018-12-23 NOTE — Assessment & Plan Note (Signed)
Her anemia has improved She will continue the same

## 2018-12-23 NOTE — Telephone Encounter (Signed)
Cheryl Melton with paracentesis appointment at [facility] on 6/25 with 10:15 arrival.  She verbalized understanding and agreement.

## 2018-12-23 NOTE — Assessment & Plan Note (Signed)
I plan minimum dose adjustment of Taxol by 20% due to high risk of neuropathy during treatment

## 2018-12-23 NOTE — Progress Notes (Signed)
START OFF PATHWAY REGIMEN - Ovarian   [id number]:Bevacizumab 15 mg/kg q21d:   A cycle is every 21 days:     Bevacizumab-xxxx   **Always confirm dose/schedule in your pharmacy ordering system**  Patient Characteristics: Recurrent or Progressive Disease, Second Line Therapy, Platinum Sensitive and ? 6 Months Since Last Therapy Therapeutic Status: Recurrent or Progressive Disease BRCA Mutation Status: Absent Line of Therapy: Second Line  Intent of Therapy: Non-Curative / Palliative Intent, Discussed with Patient

## 2018-12-23 NOTE — Assessment & Plan Note (Signed)
She is at high risk of severe hyperglycemia during treatment I recommend close follow-up with primary care doctor and advised her to check her blood sugar regularly the first few days after treatment

## 2018-12-23 NOTE — Progress Notes (Signed)
[facility] OFFICE PROGRESS NOTE  Patient Care Team: Lilian Coma., MD as PCP - General (Internal Medicine)  ASSESSMENT & PLAN:  Ovarian cancer on right [facility]) I have reviewed imaging studies and blood work with the patient We review current guidelines We discussed treatment options Options would include combination chemotherapy with carboplatin, gemcitabine with or without bevacizumab; carboplatin, liposomal doxorubicin with or without bevacizumab; and carboplatin, paclitaxel with or without bevacizumab The expected risks, benefits, side effects of treatment were discussed The goal of treatment is palliative in nature. We reviewed the NCCN guidelines We discussed some of the risks, benefits, side-effects of carboplatin & Taxol and bavcizumab. Treatment is intravenous, every 3 weeks x 6 cycles  Some of the short term side-effects included, though not limited to, including weight loss, life threatening infections, risk of allergic reactions, need for transfusions of blood products, nausea, vomiting, change in bowel habits, loss of hair, admission to hospital for various reasons, and risks of death.   Long term side-effects are also discussed including risks of infertility, permanent damage to nerve function, blood clot, bleeding, HTN, bowel perforation, hearing loss, chronic fatigue, kidney damage with possibility needing hemodialysis, and rare secondary malignancy including bone marrow disorders.  The patient is aware that the response rates discussed earlier is not guaranteed.  After a long discussion, patient made an informed decision to proceed with the prescribed plan of care.   Patient education material was dispensed. We discussed premedication with dexamethasone before chemotherapy. I do not plan to prescribe prophylactic G-CSF support I will schedule port placement next week and first dose chemotherapy I plan AUC of 5 for carboplatin and Taxol at 140 mg per metered  square I plan to hold bevacizumab and to cycle 2 of treatment to allow adequate recovery from port placement I will see her prior to cycle 2 of treatment   Malignant ascites She is currently not symptomatic but could get worse I will try to get her scheduled for paracentesis next week  Iron deficiency anemia Her anemia has improved She will continue the same  Type 2 diabetes mellitus without complication ([place]) She is at high risk of severe hyperglycemia during treatment I recommend close follow-up with primary care doctor and advised her to check her blood sugar regularly the first few days after treatment  Goals of care, counseling/discussion The patient is aware she has incurable disease and treatment is strictly palliative. We discussed importance of Advanced Directives and Living will.  Peripheral neuropathy due to chemotherapy ([place]) I plan minimum dose adjustment of Taxol by 20% due to high risk of neuropathy during treatment   Orders Placed This Encounter  Procedures  . IR Paracentesis    Standing Status:   Future    Standing Expiration Date:   02/22/2020    Order Specific Question:   If therapeutic, is there a maximum amount of fluid to be removed?    Answer:   Yes    Order Specific Question:   What is the maximum amount of fluide to be removed?    Answer:   5 liters    Order Specific Question:   Are labs required for specimen collection?    Answer:   No    Order Specific Question:   Is Albumin medication needed?    Answer:   No    Order Specific Question:   Reason for Exam (SYMPTOM  OR DIAGNOSIS REQUIRED)    Answer:   malignant ascites    Order Specific  Question:   Preferred Imaging Location?    Answer:   [facility]  . CBC with Differential ([place] Only)    Standing Status:   Standing    Number of Occurrences:   20    Standing Expiration Date:   12/23/2019  . CMP ([place] only)    Standing Status:   Standing    Number of Occurrences:   20     Standing Expiration Date:   12/23/2019  . Total Protein, Urine dipstick    Standing Status:   Standing    Number of Occurrences:   22    Standing Expiration Date:   12/23/2019  . CA 125    Standing Status:   Standing    Number of Occurrences:   9    Standing Expiration Date:   12/23/2019    INTERVAL HISTORY: Please see below for problem oriented charting. 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    03/20/2017 Imaging   GYN ultrasound was performed this revealed a small fundus measuring 5 cm there are 2 small fibroids within the fundus with the largest measuring 1.3 cm Within the endometrium there is a small amount a fluid the endometrial wall was slightly thickened at 4.8 mm the ultrasound was consistent with a small endometrial polyp  The right adnexa measured 7.4 x 6.7 cm contained a both a cystic and solid mass with a small amount of shadowing This is consistent with a dermoid tumor the left ovary was 2.6 x 1.5 x 1.0 cm There is no other adnexal masses no free fluid within the abdomen no ascites     04/10/2017 Surgery   Operation: Operative Laparscopy with  Peritoneal biopsy 2 hysteroscopy D&C Surgeon: Lynder Parents MD  Specimens: Peritoneal biopsy and cellular washings   Complications: None FIndings Small amount of ascites was found cellular washings were sent There was peritoneal studding And omental caking Even in steep Trendelenburg the ovaries and uterus were unable to be visualized due to the small intestines in the lower pelvis     04/10/2017 Pathology Results   Biopsy from [facility] was nondiagnostic.  Peritoneum biopsy did not reveal malignancy.  Endometrial biopsy also did not reveal malignancy.   04/13/2017 Tumor Marker   Patient's tumor was tested for the following markers: CA-125 Results of the tumor marker test revealed 324.1   04/20/2017 Tumor Marker   Patient's tumor was tested for the following markers: CA-125 Results of the tumor marker test revealed 345.6   04/22/2017 Pathology Results   Peritoneum, biopsy - CARCINOMA. - SEE COMMENT.   04/22/2017 Procedure   CT-guided core biopsy performed of peritoneal tumor in the left lower quadrant.   04/24/2017 - 09/25/2017 Chemotherapy   She received carboplatin and Taxol x 3 cycles followed by  interval surgical debulking on 12/31. Chemotherapy was subsequently resumed for 3 more cycles   06/05/2017 Tumor Marker   Patient's tumor was tested for the following markers: CA-125 Results of the tumor marker test revealed 27.5   06/17/2017 Imaging   1. Considerable improvement in the prior omental caking, which now presents mainly as an indistinct stranding with slight nodularity along the left upper quadrant omentum. 2. Stable appearance of right ovarian mass with fatty and calcific elements classic for a dermoid. 3. There is accentuated enhancement in the walls of the common hepatic duct and common bile duct. A low-grade cholangitis is not readily excluded. 4. Other imaging findings of potential clinical significance: Mild cardiomegaly. Aortic Atherosclerosis (ICD10-I70.0). Lumbar scoliosis, spondylosis, degenerative disc disease, and pars defects at L5. These cause  impingement at L5-S1 and lesser impingement at L3-4 and L4-5. 5. 65% compression fracture at L1 slightly worsened than on the prior exam, with associated vertebral sclerosis and posterior retropulsion.   07/06/2017 Surgery   Bilateral Salpingo-Oophorectomy W/Omentectomy, Total Abd Hysterectomy & Radical Dissection For Debulking Right - Ureterolysis, With Or Without Repositioning Of Ureter For Retroperitoneal Fibrosis   07/06/2017 Pathology Results   A: Omentum, omentectomy - Omentum with minimal residual carcinoma, consistent with high grade serous carcinoma (microscopic; stage ypT3a) - Extensive treatment effect with calcifications, fibrosis, histiocytes, and giant cell response - CRS score 3  B: Uterus with cervix and left ovary and fallopian tube, hysterectomy and left salpingo-oophorectomy - Cervix: Ectocervix and endocervix with no dysplasia or malignancy identified - Endometrium: Inactive with no hyperplasia, atypia, or malignancy identified - Myometrium: Adenomyosis and benign leiomyoma, size 1.2 cm - Serosa: Focal calcification and histiocytes suggestive of treatment effect, with no viable carcinoma identified - Ovary, left: Physiologic changes and no carcinoma identified - Fallopian tube, left: 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 and no malignancy identified  C: Ovary and fallopian tube, right, salpingo-oophorectomy - Right fallopian tube with focal residual carcinoma, consistent with high grade serous carcinoma, and serous tubal intraepithelial carcinoma (STIC) - Associated calcifications and histiocytes suggestive of treatment response - Right ovary with calcifications and histiocytes suggestive of treatment effect, with no definite viable carcinoma identified - Mature cystic teratoma, 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 - See synoptic report and comment   08/14/2017 Tumor Marker   Patient's tumor was tested for the following markers: CA-125 Results of the tumor marker test revealed 14.6    09/04/2017 Tumor Marker   Patient's tumor was tested for the following markers: CA-125 Results of the tumor marker test revealed 12.5   10/03/2017 Genetic Testing   The Common Hereditary Cancer Panel offered by [facility] includes sequencing and/or deletion duplication testing of the following 47 genes: APC, ATM, AXIN2, BARD1, BMPR1A, BRCA1, BRCA2, BRIP1, CDH1, CDKN2A (p14ARF), CDKN2A (p16INK4a), CKD4, CHEK2, CTNNA1, DICER1, EPCAM (Deletion/duplication testing only), GREM1 (promoter region deletion/duplication testing only), KIT, MEN1, MLH1, MSH2, MSH3, MSH6, MUTYH, NBN, NF1, NHTL1, PALB2, PDGFRA, PMS2, POLD1, POLE, PTEN, RAD50, RAD51C, RAD51D, SDHB, SDHC, SDHD, SMAD4, SMARCA4. STK11, TP53, TSC1, TSC2, and VHL.  The following genes were evaluated for sequence changes only: SDHA and HOXB13 c.251G>A variant only.  Results: Negative, no pathogenic variants identified.  The date of this test report is 10/03/2017.    10/28/2017 Imaging   1. Stable pulmonary nodules. No new or progressive findings. 2. No residual or recurrent omental disease is identified. No obvious peritoneal surface disease. 3. Increasing soft tissue density and ill-defined interstitial changes around the abdominal wall surgical incision. This may be progressive postoperative  changes or granulation tissue but attention on future scans to exclude the possibility of tumor. 4. Status post resection of right adnexal mass. No pelvic mass or pelvic adenopathy. 5. Stable T12 compression fracture.   01/25/2018 Tumor Marker   Patient's tumor was tested for the following markers: CA-125 Results of the tumor marker test revealed 9.2   12/10/2018 Tumor Marker   Patient's tumor was tested for the following markers: CA-125 Results of the tumor marker test revealed 212   12/22/2018 Imaging   1. Interval development of multiple soft tissue lesions in the gastrocolic ligament, mesentery, and along the peritoneal surface. These soft tissue lesions  measure up to 6.5 cm in size. Associated interval development of moderate volume ascites in the abdomen and pelvis. Imaging features are consistent with metastatic disease. 2. Soft tissue projects through the rectus sheath in the right paraumbilical region, likely herniated small bowel as there is a large amount of decompressed small bowel does deep to this finding. However, metastatic deposit at this location is also possibility.  3. Trace pneumobilia in the left liver suggests prior sphincterotomy. 4.  Aortic Atherosclerois (ICD10-170.0)     REVIEW OF SYSTEMS:   Constitutional: Denies fevers, chills or abnormal weight loss Eyes: Denies blurriness of vision Ears, nose, mouth, throat, and face: Denies mucositis or sore throat Respiratory: Denies cough, dyspnea or wheezes Cardiovascular: Denies palpitation, chest discomfort or lower extremity swelling Gastrointestinal:  Denies nausea, heartburn or change in bowel habits Skin: Denies abnormal skin rashes Lymphatics: Denies new lymphadenopathy or easy bruising Behavioral/Psych: Mood is stable, no new changes  All other systems were reviewed with the patient and are negative.  I have reviewed the past medical history, past surgical history, social history and family history with the patient and they are unchanged from previous note.  ALLERGIES:  is allergic to aspirin; nsaids; and tramadol.  MEDICATIONS:  Current Outpatient Medications  Medication Sig Dispense Refill  . acetaminophen (TYLENOL) 650 MG CR tablet Take 1,300 mg by mouth every 8 (eight) hours.     Marland Kitchen alendronate (FOSAMAX) 70 MG tablet Take 70 mg by mouth every Sunday.     Marland Kitchen atorvastatin (LIPITOR) 80 MG tablet Take 40 mg by mouth at bedtime.     . Cholecalciferol (VITAMIN D3) 2000 units TABS Take 2,000 Units by mouth daily.    . citalopram (CELEXA) 10 MG tablet Take 10 mg by mouth at bedtime    . dexamethasone (DECADRON) 4 MG tablet Take 3 tabs at the night before and 3 tabs the  morning of chemotherapy, every 3 weeks, by mouth 36 tablet 0  . gabapentin (NEURONTIN) 300 MG capsule Take by mouth.    . Glucosamine Sulfate 500 MG TABS Take 500 mg by mouth 2 (two) times daily.     Marland Kitchen lidocaine-prilocaine (EMLA) cream Apply to affected area once 30 g 3  . loratadine (CLARITIN) 10 MG tablet Take 10 mg by mouth at bedtime.     Marland Kitchen losartan (COZAAR) 25 MG tablet Take 25 mg by mouth daily.     . meclizine (ANTIVERT) 25 MG tablet Take 25 mg by mouth 2 (two) times daily as needed for dizziness.     . metFORMIN (GLUCOPHAGE) 1000 MG tablet Take 1,000 mg by mouth 2 (two) times daily with a meal.     . omeprazole (PRILOSEC) 40 MG capsule Take 40 mg by mouth daily.     . ondansetron (ZOFRAN) 8 MG tablet Take 1 tablet (8 mg total) by mouth every  8 (eight) hours as needed for refractory nausea / vomiting. 30 tablet 1  . pioglitazone (ACTOS) 15 MG tablet Take 15 mg by mouth at bedtime.     . prochlorperazine (COMPAZINE) 10 MG tablet Take 1 tablet (10 mg total) by mouth every 6 (six) hours as needed (Nausea or vomiting). 30 tablet 1  . vitamin B-12 (CYANOCOBALAMIN) 1000 MCG tablet Take 1,000 mcg by mouth daily.      No current facility-administered medications for this visit.    Facility-Administered Medications Ordered in Other Visits  Medication Dose Route Frequency Provider Last Rate Last Dose  . sodium chloride flush (NS) 0.9 % injection 10 mL  10 mL Intracatheter Once Heath Lark, MD        PHYSICAL EXAMINATION: ECOG PERFORMANCE STATUS: 1 - Symptomatic but completely ambulatory  Vitals:   12/23/18 1118  BP: 120/85  Pulse: 92  Resp: 18  Temp: 98.2 F (36.8 C)  SpO2: 98%   Filed Weights   12/23/18 1118  Weight: 241 lb 3.2 oz (109.4 kg)    GENERAL:alert, no distress and comfortable SKIN: skin color, texture, turgor are normal, no rashes or significant lesions EYES: normal, Conjunctiva are pink and non-injected, sclera clear OROPHARYNX:no exudate, no erythema and lips,  buccal mucosa, and tongue normal  NECK: supple, thyroid normal size, non-tender, without nodularity LYMPH:  no palpable lymphadenopathy in the cervical, axillary or inguinal LUNGS: clear to auscultation and percussion with normal breathing effort HEART: regular rate & rhythm and no murmurs and no lower extremity edema ABDOMEN:abdomen soft, non-tender and normal bowel sounds.  Limited examination due to severe central obesity Musculoskeletal:no cyanosis of digits and no clubbing  NEURO: alert & oriented x 3 with fluent speech, no focal motor/sensory deficits  LABORATORY DATA:  I have reviewed the data as listed    Component Value Date/Time   NA 139 12/10/2018 1008   NA 139 06/05/2017 0746   K 3.9 12/10/2018 1008   K 4.1 06/05/2017 0746   CL 102 12/10/2018 1008   CO2 26 12/10/2018 1008   CO2 21 (L) 06/05/2017 0746   GLUCOSE 160 (H) 12/10/2018 1008   GLUCOSE 279 (H) 06/05/2017 0746   BUN 13 12/10/2018 1008   BUN 15.0 06/05/2017 0746   CREATININE 0.83 12/10/2018 1008   CREATININE 0.9 06/05/2017 0746   CALCIUM 8.8 (L) 12/10/2018 1008   CALCIUM 10.1 06/05/2017 0746   PROT 7.5 12/10/2018 1008   PROT 8.1 06/05/2017 0746   ALBUMIN 3.8 12/10/2018 1008   ALBUMIN 4.0 06/05/2017 0746   AST 27 12/10/2018 1008   AST 22 06/05/2017 0746   ALT 27 12/10/2018 1008   ALT 36 06/05/2017 0746   ALKPHOS 62 12/10/2018 1008   ALKPHOS 98 06/05/2017 0746   BILITOT 0.4 12/10/2018 1008   BILITOT 0.44 06/05/2017 0746   GFRNONAA >60 12/10/2018 1008   GFRAA >60 12/10/2018 1008    No results found for: SPEP, UPEP  Lab Results  Component Value Date   WBC 4.6 12/10/2018   NEUTROABS 3.1 12/10/2018   HGB 11.7 (L) 12/10/2018   HCT 37.9 12/10/2018   MCV 95.9 12/10/2018   PLT 286 12/10/2018      Chemistry      Component Value Date/Time   NA 139 12/10/2018 1008   NA 139 06/05/2017 0746   K 3.9 12/10/2018 1008   K 4.1 06/05/2017 0746   CL 102 12/10/2018 1008   CO2 26 12/10/2018 1008   CO2 21 (L)  06/05/2017 0746  BUN 13 12/10/2018 1008   BUN 15.0 06/05/2017 0746   CREATININE 0.83 12/10/2018 1008   CREATININE 0.9 06/05/2017 0746      Component Value Date/Time   CALCIUM 8.8 (L) 12/10/2018 1008   CALCIUM 10.1 06/05/2017 0746   ALKPHOS 62 12/10/2018 1008   ALKPHOS 98 06/05/2017 0746   AST 27 12/10/2018 1008   AST 22 06/05/2017 0746   ALT 27 12/10/2018 1008   ALT 36 06/05/2017 0746   BILITOT 0.4 12/10/2018 1008   BILITOT 0.44 06/05/2017 0746       RADIOGRAPHIC STUDIES: I have reviewed multiple imaging studies with the patient I have personally reviewed the radiological images as listed and agreed with the findings in the report. Ct Abdomen Pelvis W Contrast  Result Date: 12/22/2018 CLINICAL DATA:  Ovarian cancer. EXAM: CT ABDOMEN AND PELVIS WITH CONTRAST TECHNIQUE: Multidetector CT imaging of the abdomen and pelvis was performed using the standard protocol following bolus administration of intravenous contrast. CONTRAST:  174m OMNIPAQUE IOHEXOL 300 MG/ML  SOLN COMPARISON:  10/28/2017. FINDINGS: Lower chest: Coronary artery calcification is evident. Hepatobiliary: No suspicious focal abnormality within the liver parenchyma. Mild intrahepatic biliary duct prominence noted. Trace pneumobilia in the left liver suggests prior sphincterotomy. Gallbladder surgically absent. Pancreas: Pancreas diffusely atrophic. Spleen: No splenomegaly. No focal mass lesion. Adrenals/Urinary Tract: No adrenal nodule or mass. Kidneys unremarkable. No evidence for hydroureter. Bladder is nondistended. Stomach/Bowel: Tiny hiatal hernia. Stomach otherwise unremarkable. Duodenum is normally positioned as is the ligament of Treitz. No small bowel wall thickening. No small bowel dilatation. The terminal ileum is normal. The appendix is normal. No gross colonic mass. No colonic wall thickening. Vascular/Lymphatic: There is abdominal aortic atherosclerosis without aneurysm. There is no gastrohepatic or hepatoduodenal  ligament lymphadenopathy. No intraperitoneal or retroperitoneal lymphadenopathy. No pelvic sidewall lymphadenopathy. Reproductive: The uterus is surgically absent. There is no adnexal mass. Other: Interval development of moderate ascites around the liver and spleen with moderate fluid in the pelvis. Nodular soft tissue is identified in the gastrocolic ligament including a confluent 6.5 x 2.6 cm soft tissue lesion on 36/2. Multiple mesenteric and peritoneal soft tissue nodules are new in the interval. 2.1 x 2.7 cm peritoneal nodule is seen in the right pelvis on 64/2. Multiple nodules are seen along the left paracolic gutter (401/7 and in the peritoneum along the right paracolic gutter (479/3. 2.6 x 3.9 cm soft tissue lesion is identified in the right pelvis on 71/2 and a 2.4 x 2.9 cm lesion is noted in the left cul-de-sac on 75/2. A 3.7 x 3.4 cm lesion is noted along the bladder dome (78/2). Musculoskeletal: Soft tissue protruding through the paraumbilical rectus sheath (74/2) is probably decompressed small bowel, but metastatic anterior abdominal wall deposit not excluded. No worrisome lytic or sclerotic osseous abnormality. Bilateral pars interarticularis defects are noted at L5. Stable inferior endplate compression fracture at T12. IMPRESSION: 1. Interval development of multiple soft tissue lesions in the gastrocolic ligament, mesentery, and along the peritoneal surface. These soft tissue lesions measure up to 6.5 cm in size. Associated interval development of moderate volume ascites in the abdomen and pelvis. Imaging features are consistent with metastatic disease. 2. Soft tissue projects through the rectus sheath in the right paraumbilical region, likely herniated small bowel as there is a large amount of decompressed small bowel does deep to this finding. However, metastatic deposit at this location is also possibility. 3. Trace pneumobilia in the left liver suggests prior sphincterotomy. 4.  Aortic  Atherosclerois (ICD10-170.0) Electronically  Signed   By: Misty Stanley M.D.   On: 12/22/2018 16:41    All questions were answered. The patient knows to call the clinic with any problems, questions or concerns. No barriers to learning was detected.  I spent 55 minutes counseling the patient face to face. The total time spent in the appointment was 70 minutes and more than 50% was on counseling and review of test results  Heath Lark, MD 12/23/2018 1:00 PM

## 2018-12-23 NOTE — Assessment & Plan Note (Signed)
She is currently not symptomatic but could get worse I will try to get her scheduled for paracentesis next week

## 2018-12-23 NOTE — Assessment & Plan Note (Signed)
The patient is aware she has incurable disease and treatment is strictly palliative. We discussed importance of Advanced Directives and Living will. 

## 2018-12-23 NOTE — Telephone Encounter (Addendum)
Requested CBC p diff and CMP be drawn with port placement with Tiffany in IR.

## 2018-12-23 NOTE — Assessment & Plan Note (Addendum)
I have reviewed imaging studies and blood work with the patient We review current guidelines We discussed treatment options Options would include combination chemotherapy with carboplatin, gemcitabine with or without bevacizumab; carboplatin, liposomal doxorubicin with or without bevacizumab; and carboplatin, paclitaxel with or without bevacizumab The expected risks, benefits, side effects of treatment were discussed The goal of treatment is palliative in nature. We reviewed the NCCN guidelines We discussed some of the risks, benefits, side-effects of carboplatin & Taxol and bavcizumab. Treatment is intravenous, every 3 weeks x 6 cycles  Some of the short term side-effects included, though not limited to, including weight loss, life threatening infections, risk of allergic reactions, need for transfusions of blood products, nausea, vomiting, change in bowel habits, loss of hair, admission to hospital for various reasons, and risks of death.   Long term side-effects are also discussed including risks of infertility, permanent damage to nerve function, blood clot, bleeding, HTN, bowel perforation, hearing loss, chronic fatigue, kidney damage with possibility needing hemodialysis, and rare secondary malignancy including bone marrow disorders.  The patient is aware that the response rates discussed earlier is not guaranteed.  After a long discussion, patient made an informed decision to proceed with the prescribed plan of care.   Patient education material was dispensed. We discussed premedication with dexamethasone before chemotherapy. I do not plan to prescribe prophylactic G-CSF support I will schedule port placement next week and first dose chemotherapy I plan AUC of 5 for carboplatin and Taxol at 140 mg per metered square I plan to hold bevacizumab and to cycle 2 of treatment to allow adequate recovery from port placement I will see her prior to cycle 2 of treatment

## 2018-12-24 ENCOUNTER — Telehealth: Payer: Self-pay | Admitting: Hematology and Oncology

## 2018-12-24 NOTE — Telephone Encounter (Signed)
I talk with patient regarding schedule  

## 2018-12-29 ENCOUNTER — Other Ambulatory Visit: Payer: Self-pay | Admitting: Radiology

## 2018-12-30 ENCOUNTER — Encounter (HOSPITAL_COMMUNITY): Payer: Self-pay

## 2018-12-30 ENCOUNTER — Ambulatory Visit (HOSPITAL_COMMUNITY)
Admission: RE | Admit: 2018-12-30 | Discharge: 2018-12-30 | Disposition: A | Discharge status: Home / Self Care | Payer: Medicare Other | Source: Ambulatory Visit | Attending: Hematology and Oncology | Admitting: Hematology and Oncology

## 2018-12-30 ENCOUNTER — Other Ambulatory Visit: Payer: Self-pay

## 2018-12-30 DIAGNOSIS — Z886 Allergy status to analgesic agent status: Secondary | ICD-10-CM | POA: Insufficient documentation

## 2018-12-30 DIAGNOSIS — C561 Malignant neoplasm of right ovary: Secondary | ICD-10-CM | POA: Insufficient documentation

## 2018-12-30 DIAGNOSIS — D367 Benign neoplasm of other specified sites: Secondary | ICD-10-CM | POA: Insufficient documentation

## 2018-12-30 DIAGNOSIS — Z7984 Long term (current) use of oral hypoglycemic drugs: Secondary | ICD-10-CM | POA: Insufficient documentation

## 2018-12-30 DIAGNOSIS — M810 Age-related osteoporosis without current pathological fracture: Secondary | ICD-10-CM | POA: Diagnosis not present

## 2018-12-30 DIAGNOSIS — Z79899 Other long term (current) drug therapy: Secondary | ICD-10-CM | POA: Insufficient documentation

## 2018-12-30 DIAGNOSIS — R180 Malignant ascites: Secondary | ICD-10-CM

## 2018-12-30 DIAGNOSIS — E119 Type 2 diabetes mellitus without complications: Secondary | ICD-10-CM | POA: Diagnosis not present

## 2018-12-30 DIAGNOSIS — K219 Gastro-esophageal reflux disease without esophagitis: Secondary | ICD-10-CM | POA: Insufficient documentation

## 2018-12-30 DIAGNOSIS — Z8042 Family history of malignant neoplasm of prostate: Secondary | ICD-10-CM | POA: Diagnosis not present

## 2018-12-30 DIAGNOSIS — Z808 Family history of malignant neoplasm of other organs or systems: Secondary | ICD-10-CM | POA: Diagnosis not present

## 2018-12-30 DIAGNOSIS — Z888 Allergy status to other drugs, medicaments and biological substances status: Secondary | ICD-10-CM | POA: Diagnosis not present

## 2018-12-30 HISTORY — DX: Family history of other specified conditions: Z84.89

## 2018-12-30 HISTORY — PX: IR IMAGING GUIDED PORT INSERTION: IMG5740

## 2018-12-30 LAB — COMPREHENSIVE METABOLIC PANEL
ALT: 25 U/L (ref 0–44)
AST: 37 U/L (ref 15–41)
Albumin: 3.7 g/dL (ref 3.5–5.0)
Alkaline Phosphatase: 50 U/L (ref 38–126)
Anion gap: 8 (ref 5–15)
BUN: 17 mg/dL (ref 8–23)
CO2: 27 mmol/L (ref 22–32)
Calcium: 8.8 mg/dL — ABNORMAL LOW (ref 8.9–10.3)
Chloride: 104 mmol/L (ref 98–111)
Creatinine, Ser: 0.75 mg/dL (ref 0.44–1.00)
GFR calc Af Amer: >60 mL/min (ref >60)
GFR calc non Af Amer: >60 mL/min (ref >60)
Glucose, Bld: 120 mg/dL — ABNORMAL HIGH (ref 70–99)
Potassium: 4.3 mmol/L (ref 3.5–5.1)
Sodium: 139 mmol/L (ref 135–145)
Total Bilirubin: 0.4 mg/dL (ref 0.3–1.2)
Total Protein: 7.3 g/dL (ref 6.5–8.1)

## 2018-12-30 LAB — CBC WITH DIFFERENTIAL/PLATELET
Abs Immature Granulocytes: 0.01 10*3/uL (ref 0.00–0.07)
Basophils Absolute: 0 10*3/uL (ref 0.0–0.1)
Basophils Relative: 1 %
Eosinophils Absolute: 0.1 10*3/uL (ref 0.0–0.5)
Eosinophils Relative: 2 %
HCT: 41.2 % (ref 36.0–46.0)
Hemoglobin: 12.4 g/dL (ref 12.0–15.0)
Immature Granulocytes: 0 %
Lymphocytes Relative: 26 %
Lymphs Abs: 1.4 10*3/uL (ref 0.7–4.0)
MCH: 29.5 pg (ref 26.0–34.0)
MCHC: 30.1 g/dL (ref 30.0–36.0)
MCV: 97.9 fL (ref 80.0–100.0)
Monocytes Absolute: 0.4 10*3/uL (ref 0.1–1.0)
Monocytes Relative: 7 %
Neutro Abs: 3.6 10*3/uL (ref 1.7–7.7)
Neutrophils Relative %: 64 %
Platelets: 352 10*3/uL (ref 150–400)
RBC: 4.21 MIL/uL (ref 3.87–5.11)
RDW: 15 % (ref 11.5–15.5)
WBC: 5.6 10*3/uL (ref 4.0–10.5)
nRBC: 0 % (ref 0.0–0.2)

## 2018-12-30 LAB — GLUCOSE, CAPILLARY: Glucose-Capillary: 99 mg/dL (ref 70–99)

## 2018-12-30 LAB — HEMOGLOBIN A1C
Hgb A1c MFr Bld: 6.8 % — ABNORMAL HIGH (ref 4.8–5.6)
Mean Plasma Glucose: 148.46 mg/dL

## 2018-12-30 LAB — PROTIME-INR
INR: 0.9 (ref 0.8–1.2)
Prothrombin Time: 11.8 seconds (ref 11.4–15.2)

## 2018-12-30 LAB — APTT: aPTT: 25 seconds (ref 24–36)

## 2018-12-30 MED ORDER — CEFAZOLIN SODIUM-DEXTROSE 2-4 GM/100ML-% IV SOLN
2.0000 g | INTRAVENOUS | Status: DC
  Filled 2018-12-30: qty 100

## 2018-12-30 MED ORDER — NALOXONE HCL 0.4 MG/ML IJ SOLN
INTRAMUSCULAR | Status: AC
  Filled 2018-12-30: qty 1

## 2018-12-30 MED ORDER — FENTANYL CITRATE (PF) 100 MCG/2ML IJ SOLN
INTRAMUSCULAR | Status: AC | PRN
  Administered 2018-12-30 (×2): 50 ug via INTRAVENOUS

## 2018-12-30 MED ORDER — LIDOCAINE-EPINEPHRINE (PF) 2 %-1:200000 IJ SOLN
INTRAMUSCULAR | Status: AC
  Filled 2018-12-30: qty 20

## 2018-12-30 MED ORDER — MIDAZOLAM HCL 2 MG/2ML IJ SOLN
INTRAMUSCULAR | Status: AC
  Filled 2018-12-30: qty 4

## 2018-12-30 MED ORDER — FLUMAZENIL 0.5 MG/5ML IV SOLN
INTRAVENOUS | Status: AC
  Filled 2018-12-30: qty 5

## 2018-12-30 MED ORDER — SODIUM CHLORIDE 0.9 % IV SOLN
INTRAVENOUS | Status: DC
  Administered 2018-12-30: 13:00:00 via INTRAVENOUS

## 2018-12-30 MED ORDER — LIDOCAINE-EPINEPHRINE 2 %-1:100000 IJ SOLN
INTRAMUSCULAR | Status: AC | PRN
  Administered 2018-12-30: 20 mL

## 2018-12-30 MED ORDER — HEPARIN SOD (PORK) LOCK FLUSH 100 UNIT/ML IV SOLN
INTRAVENOUS | Status: AC
  Filled 2018-12-30: qty 5

## 2018-12-30 MED ORDER — FENTANYL CITRATE (PF) 100 MCG/2ML IJ SOLN
INTRAMUSCULAR | Status: AC
  Filled 2018-12-30: qty 4

## 2018-12-30 MED ORDER — MIDAZOLAM HCL 2 MG/2ML IJ SOLN
INTRAMUSCULAR | Status: AC | PRN
  Administered 2018-12-30: 0.5 mg via INTRAVENOUS
  Administered 2018-12-30: 1 mg via INTRAVENOUS

## 2018-12-30 MED ORDER — CEFAZOLIN SODIUM-DEXTROSE 2-4 GM/100ML-% IV SOLN
INTRAVENOUS | Status: AC | PRN
  Administered 2018-12-30: 2 g via INTRAVENOUS

## 2018-12-30 MED ORDER — LIDOCAINE HCL 1 % IJ SOLN
INTRAMUSCULAR | Status: AC
  Filled 2018-12-30: qty 20

## 2018-12-30 NOTE — Discharge Instructions (Addendum)
Implanted Port Insertion, Care After °This sheet gives you information about how to care for yourself after your procedure. Your health care provider may also give you more specific instructions. If you have problems or questions, contact your health care provider. °What can I expect after the procedure? °After the procedure, it is common to have: °· Discomfort at the port insertion site. °· Bruising on the skin over the port. This should improve over 3-4 days. °Follow these instructions at home: °Port care °· After your port is placed, you will get a manufacturer's information card. The card has information about your port. Keep this card with you at all times. °· Take care of the port as told by your health care provider. Ask your health care provider if you or a family member can get training for taking care of the port at home. A home health care nurse may also take care of the port. °· Make sure to remember what type of port you have. °Incision care ° °  ° °· Follow instructions from your health care provider about how to take care of your port insertion site. Make sure you: °? Wash your hands with soap and water before and after you change your bandage (dressing). If soap and water are not available, use hand sanitizer. °? Change your dressing as told by your health care provider. °? Leave stitches (sutures), skin glue, or adhesive strips in place. These skin closures may need to stay in place for 2 weeks or longer. If adhesive strip edges start to loosen and curl up, you may trim the loose edges. Do not remove adhesive strips completely unless your health care provider tells you to do that. °· Check your port insertion site every day for signs of infection. Check for: °? Redness, swelling, or pain. °? Fluid or blood. °? Warmth. °? Pus or a bad smell. °Activity °· Return to your normal activities as told by your health care provider. Ask your health care provider what activities are safe for you. °· Do not  lift anything that is heavier than 10 lb (4.5 kg), or the limit that you are told, until your health care provider says that it is safe. °General instructions °· Take over-the-counter and prescription medicines only as told by your health care provider. °· Do not take baths, swim, or use a hot tub until your health care provider approves. Ask your health care provider if you may take showers. You may only be allowed to take sponge baths. °· Do not drive for 24 hours if you were given a sedative during your procedure. °· Wear a medical alert bracelet in case of an emergency. This will tell any health care providers that you have a port. °· Keep all follow-up visits as told by your health care provider. This is important. °Contact a health care provider if: °· You cannot flush your port with saline as directed, or you cannot draw blood from the port. °· You have a fever or chills. °· You have redness, swelling, or pain around your port insertion site. °· You have fluid or blood coming from your port insertion site. °· Your port insertion site feels warm to the touch. °· You have pus or a bad smell coming from the port insertion site. °Get help right away if: °· You have chest pain or shortness of breath. °· You have bleeding from your port that you cannot control. °Summary °· Take care of the port as told by your health   care provider. Keep the manufacturer's information card with you at all times. °· Change your dressing as told by your health care provider. °· Contact a health care provider if you have a fever or chills or if you have redness, swelling, or pain around your port insertion site. °· Keep all follow-up visits as told by your health care provider. °This information is not intended to replace advice given to you by your health care provider. Make sure you discuss any questions you have with your health care provider. °Document Released: 04/13/2013 Document Revised: 01/19/2018 Document Reviewed:  01/19/2018 °[employer] Interactive Patient Education © 2019 [employer] °Implanted Port Home Guide °An implanted port is a device that is placed under the skin. It is usually placed in the chest. The device can be used to give IV medicine, to take blood, or for dialysis. You may have an implanted port if: °· You need IV medicine that would be irritating to the small veins in your hands or arms. °· You need IV medicines, such as antibiotics, for a long period of time. °· You need IV nutrition for a long period of time. °· You need dialysis. °Having a port means that your health care provider will not need to use the veins in your arms for these procedures. You may have fewer limitations when using a port than you would if you used other types of long-term IVs, and you will likely be able to return to normal activities after your incision heals. °An implanted port has two main parts: °· Reservoir. The reservoir is the part where a needle is inserted to give medicines or draw blood. The reservoir is round. After it is placed, it appears as a small, raised area under your skin. °· Catheter. The catheter is a thin, flexible tube that connects the reservoir to a vein. Medicine that is inserted into the reservoir goes into the catheter and then into the vein. °How is my port accessed? °To access your port: °· A numbing cream may be placed on the skin over the port site. °· Your health care provider will put on a mask and sterile gloves. °· The skin over your port will be cleaned carefully with a germ-killing soap and allowed to dry. °· Your health care provider will gently pinch the port and insert a needle into it. °· Your health care provider will check for a blood return to make sure the port is in the vein and is not clogged. °· If your port needs to remain accessed to get medicine continuously (constant infusion), your health care provider will place a clear bandage (dressing) over the needle site. The dressing and  needle will need to be changed every week, or as told by your health care provider. °What is flushing? °Flushing helps keep the port from getting clogged. Follow instructions from your health care provider about how and when to flush the port. Ports are usually flushed with saline solution or a medicine called heparin. The need for flushing will depend on how the port is used: °· If the port is only used from time to time to give medicines or draw blood, the port may need to be flushed: °? Before and after medicines have been given. °? Before and after blood has been drawn. °? As part of routine maintenance. Flushing may be recommended every 4-6 weeks. °· If a constant infusion is running, the port may not need to be flushed. °· Throw away any syringes in a disposal   container that is meant for sharp items (sharps container). You can buy a sharps container from a pharmacy, or you can make one by using an empty hard plastic bottle with a cover. °How long will my port stay implanted? °The port can stay in for as long as your health care provider thinks it is needed. When it is time for the port to come out, a surgery will be done to remove it. The surgery will be similar to the procedure that was done to put the port in. °Follow these instructions at home: ° °· Flush your port as told by your health care provider. °· If you need an infusion over several days, follow instructions from your health care provider about how to take care of your port site. Make sure you: °? Wash your hands with soap and water before you change your dressing. If soap and water are not available, use alcohol-based hand sanitizer. °? Change your dressing as told by your health care provider. °? Place any used dressings or infusion bags into a plastic bag. Throw that bag in the trash. °? Keep the dressing that covers the needle clean and dry. Do not get it wet. °? Do not use scissors or sharp objects near the tube. °? Keep the tube clamped,  unless it is being used. °· Check your port site every day for signs of infection. Check for: °? Redness, swelling, or pain. °? Fluid or blood. °? Pus or a bad smell. °· Protect the skin around the port site. °? Avoid wearing bra straps that rub or irritate the site. °? Protect the skin around your port from seat belts. Place a soft pad over your chest if needed. °· Bathe or shower as told by your health care provider. The site may get wet as long as you are not actively receiving an infusion. °· Return to your normal activities as told by your health care provider. Ask your health care provider what activities are safe for you. °· Carry a medical alert card or wear a medical alert bracelet at all times. This will let health care providers know that you have an implanted port in case of an emergency. °Get help right away if: °· You have redness, swelling, or pain at the port site. °· You have fluid or blood coming from your port site. °· You have pus or a bad smell coming from the port site. °· You have a fever. °Summary °· Implanted ports are usually placed in the chest for long-term IV access. °· Follow instructions from your health care provider about flushing the port and changing bandages (dressings). °· Take care of the area around your port by avoiding clothing that puts pressure on the area, and by watching for signs of infection. °· Protect the skin around your port from seat belts. Place a soft pad over your chest if needed. °· Get help right away if you have a fever or you have redness, swelling, pain, drainage, or a bad smell at the port site. °This information is not intended to replace advice given to you by your health care provider. Make sure you discuss any questions you have with your health care provider. °Document Released: 06/23/2005 Document Revised: 07/26/2016 Document Reviewed: 07/26/2016 °[employer] Interactive Patient Education © 2019 [employer] °Moderate Conscious Sedation, Adult, Care  After °These instructions provide you with information about caring for yourself after your procedure. Your health care provider may also give you more specific instructions. Your treatment has   been planned according to current medical practices, but problems sometimes occur. Call your health care provider if you have any problems or questions after your procedure. °What can I expect after the procedure? °After your procedure, it is common: °· To feel sleepy for several hours. °· To feel clumsy and have poor balance for several hours. °· To have poor judgment for several hours. °· To vomit if you eat too soon. °Follow these instructions at home: °For at least 24 hours after the procedure: ° °· Do not: °? Participate in activities where you could fall or become injured. °? Drive. °? Use heavy machinery. °? Drink alcohol. °? Take sleeping pills or medicines that cause drowsiness. °? Make important decisions or sign legal documents. °? Take care of children on your own. °· Rest. °Eating and drinking °· Follow the diet recommended by your health care provider. °· If you vomit: °? Drink water, juice, or soup when you can drink without vomiting. °? Make sure you have little or no nausea before eating solid foods. °General instructions °· Have a responsible adult stay with you until you are awake and alert. °· Take over-the-counter and prescription medicines only as told by your health care provider. °· If you smoke, do not smoke without supervision. °· Keep all follow-up visits as told by your health care provider. This is important. °Contact a health care provider if: °· You keep feeling nauseous or you keep vomiting. °· You feel light-headed. °· You develop a rash. °· You have a fever. °Get help right away if: °· You have trouble breathing. °This information is not intended to replace advice given to you by your health care provider. Make sure you discuss any questions you have with your health care provider. °Document  Released: 04/13/2013 Document Revised: 11/26/2015 Document Reviewed: 10/13/2015 °[employer] Interactive Patient Education © 2019 [employer] ° °

## 2018-12-30 NOTE — H&P (Signed)
Chief Complaint: Patient was seen in consultation today for port placement at the request of Organ  Referring Physician(s): Leary  Supervising Physician: Arne Cleveland  Patient Status: [place]  History of Present Illness: Cheryl Melton is a 68 y.o. female with ovarian cancer. She is to start chemotherapy tomorrow and is referred for port placement. Had paracentesis earlier today, feels much better. PMHx, meds, labs, imaging, allergies reviewed. Feels well, no recent fevers, chills, illness. Has been NPO today as directed.   Past Medical History:  Diagnosis Date   Anxiety    Cancer ([place]) 04/2017   ovarian   Diabetes mellitus without complication (HCC)    Family history of adverse reaction to anesthesia    sister nausea vomitting   Family history of colon cancer    Family history of prostate cancer    GERD (gastroesophageal reflux disease)    Osteoporosis    Parathyroid adenoma     Past Surgical History:  Procedure Laterality Date   CHOLECYSTECTOMY     COLONOSCOPY WITH ESOPHAGOGASTRODUODENOSCOPY (EGD)     DILATION AND CURETTAGE OF UTERUS     ELBOW SURGERY     PARATHYROIDECTOMY  10/21/2016   One side done pt not sure which side.    ROTATOR CUFF REPAIR Right    TONSILLECTOMY      Allergies: Aspirin, Nsaids, and Tramadol  Medications: Prior to Admission medications   Medication Sig Start Date End Date Taking? Authorizing Provider  acetaminophen (TYLENOL) 650 MG CR tablet Take 1,300 mg by mouth every 8 (eight) hours.    Yes [provider]  atorvastatin (LIPITOR) 80 MG tablet Take 40 mg by mouth at bedtime.  01/29/17  Yes [provider]  Cholecalciferol (VITAMIN D3) 2000 units TABS Take 2,000 Units by mouth daily.   Yes [provider]  citalopram (CELEXA) 10 MG tablet Take 10 mg by mouth at bedtime 04/10/17  Yes [provider]  ferrous sulfate 325 (65 FE) MG tablet Take 325 mg by mouth  daily. 3 hours after meal and 30 min before a meal   Yes [provider]  gabapentin (NEURONTIN) 300 MG capsule Take by mouth. 03/30/17  Yes [provider]  Glucosamine Sulfate 500 MG TABS Take 500 mg by mouth 2 (two) times daily.    Yes [provider]  loratadine (CLARITIN) 10 MG tablet Take 10 mg by mouth at bedtime.    Yes [provider]  losartan (COZAAR) 25 MG tablet Take 25 mg by mouth daily.  02/04/17  Yes [provider]  metFORMIN (GLUCOPHAGE) 1000 MG tablet Take 1,000 mg by mouth 2 (two) times daily with a meal.  04/30/16  Yes [provider]  omeprazole (PRILOSEC) 40 MG capsule Take 40 mg by mouth daily.  03/20/17  Yes [provider]  pioglitazone (ACTOS) 15 MG tablet Take 15 mg by mouth at bedtime.  11/03/16  Yes [provider]  Pyridoxine HCl (VITAMIN B-6 PO) Take 50 mg by mouth 1 day or 1 dose.   Yes [provider]  vitamin B-12 (CYANOCOBALAMIN) 1000 MCG tablet Take 1,000 mcg by mouth daily.    Yes [provider]  alendronate (FOSAMAX) 70 MG tablet Take 70 mg by mouth every Sunday.  09/22/16   [provider]  dexamethasone (DECADRON) 4 MG tablet Take 3 tabs at the night before and 3 tabs the morning of chemotherapy, every 3 weeks, by mouth 12/23/18   Heath Lark, MD  lidocaine-prilocaine (EMLA) cream  Apply to affected area once 12/23/18   Heath Lark, MD  meclizine (ANTIVERT) 25 MG tablet Take 25 mg by mouth 2 (two) times daily as needed for dizziness.  03/25/16   [provider]  ondansetron (ZOFRAN) 8 MG tablet Take 1 tablet (8 mg total) by mouth every 8 (eight) hours as needed for refractory nausea / vomiting. 12/23/18   Heath Lark, MD  prochlorperazine (COMPAZINE) 10 MG tablet Take 1 tablet (10 mg total) by mouth every 6 (six) hours as needed (Nausea or vomiting). 12/23/18   Heath Lark, MD     Family History  Problem Relation Age of Onset   Colon cancer Mother 58    Prostate cancer Father 17       metastatic   Colon cancer Paternal Uncle        age dx unk   Heart attack Maternal Grandmother    Aneurysm Maternal Grandfather    Alzheimer's disease Paternal Grandmother    Lung cancer Paternal Uncle    Heart attack Paternal Uncle     Social History   Socioeconomic History   Marital status: Married    Spouse name: Mikeal Hawthorne   Number of children: 0   Years of education: Not on file   Highest education level: Not on file  Occupational History   Occupation: retired  Scientist, product/process development strain: Not on file   Food insecurity    Worry: Not on file    Inability: Not on Lexicographer needs    Medical: Not on file    Non-medical: Not on file  Tobacco Use   Smoking status: Never Smoker   Smokeless tobacco: Never Used  Substance and Sexual Activity   Alcohol use: No   Drug use: No   Sexual activity: Not on file  Lifestyle   Physical activity    Days per week: Not on file    Minutes per session: Not on file   Stress: Not on file  Relationships   Social connections    Talks on phone: Not on file    Gets together: Not on file    Attends religious service: Not on file    Active member of club or organization: Not on file    Attends meetings of clubs or organizations: Not on file    Relationship status: Not on file  Other Topics Concern   Not on file  Social History Narrative   Not on file     Review of Systems: A 12 point ROS discussed and pertinent positives are indicated in the HPI above.  All other systems are negative.  Review of Systems  Vital Signs: BP 129/71    Pulse 82    Temp 98.3 F (36.8 C) (Oral)    Resp 18    SpO2 97%   Physical Exam Constitutional:      Appearance: Normal appearance.  HENT:     Mouth/Throat:     Mouth: Mucous membranes are moist.     Pharynx: Oropharynx is clear.  Cardiovascular:     Rate and Rhythm: Normal rate and regular rhythm.     Heart sounds:  Normal heart sounds.  Pulmonary:     Effort: Pulmonary effort is normal. No respiratory distress.     Breath sounds: Normal breath sounds.  Abdominal:     General: Abdomen is flat. There is no distension.     Palpations: Abdomen is soft.     Tenderness: There is no abdominal tenderness.  Neurological:     General: No focal deficit present.     Mental Status: She is alert and oriented to person, place, and time.  Psychiatric:        Mood and Affect: Mood normal.        Judgment: Judgment normal.       Imaging: Ct Abdomen Pelvis W Contrast  Result Date: 12/22/2018 CLINICAL DATA:  Ovarian cancer. EXAM: CT ABDOMEN AND PELVIS WITH CONTRAST TECHNIQUE: Multidetector CT imaging of the abdomen and pelvis was performed using the standard protocol following bolus administration of intravenous contrast. CONTRAST:  154mL OMNIPAQUE IOHEXOL 300 MG/ML  SOLN COMPARISON:  10/28/2017. FINDINGS: Lower chest: Coronary artery calcification is evident. Hepatobiliary: No suspicious focal abnormality within the liver parenchyma. Mild intrahepatic biliary duct prominence noted. Trace pneumobilia in the left liver suggests prior sphincterotomy. Gallbladder surgically absent. Pancreas: Pancreas diffusely atrophic. Spleen: No splenomegaly. No focal mass lesion. Adrenals/Urinary Tract: No adrenal nodule or mass. Kidneys unremarkable. No evidence for hydroureter. Bladder is nondistended. Stomach/Bowel: Tiny hiatal hernia. Stomach otherwise unremarkable. Duodenum is normally positioned as is the ligament of Treitz. No small bowel wall thickening. No small bowel dilatation. The terminal ileum is normal. The appendix is normal. No gross colonic mass. No colonic wall thickening. Vascular/Lymphatic: There is abdominal aortic atherosclerosis without aneurysm. There is no gastrohepatic or hepatoduodenal ligament lymphadenopathy. No intraperitoneal or retroperitoneal lymphadenopathy. No pelvic sidewall lymphadenopathy. Reproductive:  The uterus is surgically absent. There is no adnexal mass. Other: Interval development of moderate ascites around the liver and spleen with moderate fluid in the pelvis. Nodular soft tissue is identified in the gastrocolic ligament including a confluent 6.5 x 2.6 cm soft tissue lesion on 36/2. Multiple mesenteric and peritoneal soft tissue nodules are new in the interval. 2.1 x 2.7 cm peritoneal nodule is seen in the right pelvis on 64/2. Multiple nodules are seen along the left paracolic gutter (53/2) and in the peritoneum along the right paracolic gutter (99/2). 2.6 x 3.9 cm soft tissue lesion is identified in the right pelvis on 71/2 and a 2.4 x 2.9 cm lesion is noted in the left cul-de-sac on 75/2. A 3.7 x 3.4 cm lesion is noted along the bladder dome (78/2). Musculoskeletal: Soft tissue protruding through the paraumbilical rectus sheath (74/2) is probably decompressed small bowel, but metastatic anterior abdominal wall deposit not excluded. No worrisome lytic or sclerotic osseous abnormality. Bilateral pars interarticularis defects are noted at L5. Stable inferior endplate compression fracture at T12. IMPRESSION: 1. Interval development of multiple soft tissue lesions in the gastrocolic ligament, mesentery, and along the peritoneal surface. These soft tissue lesions measure up to 6.5 cm in size. Associated interval development of moderate volume ascites in the abdomen and pelvis. Imaging features are consistent with metastatic disease. 2. Soft tissue projects through the rectus sheath in the right paraumbilical region, likely herniated small bowel as there is a large amount of decompressed small bowel does deep to this finding. However, metastatic deposit at this location is also possibility. 3. Trace pneumobilia in the left liver suggests prior sphincterotomy. 4.  Aortic Atherosclerois (ICD10-170.0) Electronically Signed   By: Misty Stanley M.D.   On: 12/22/2018 16:41   US Paracentesis  Result Date:  12/30/2018 INDICATION: Patient with abdominal distention, ascites. Request is made for therapeutic paracentesis. EXAM: ULTRASOUND GUIDED THERAPEUTIC PARACENTESIS MEDICATIONS: 10 mL 1% lidocaine COMPLICATIONS: None immediate. PROCEDURE: Informed written consent was obtained from the patient after a discussion of the risks, benefits and alternatives to treatment. A timeout  was performed prior to the initiation of the procedure. Initial ultrasound scanning demonstrates a moderate amount of ascites within the right lower abdominal quadrant. The right lower abdomen was prepped and draped in the usual sterile fashion. 1% lidocaine was used for local anesthesia. Following this, a 19 gauge, 7-cm, Yueh catheter was introduced. An ultrasound image was saved for documentation purposes. The paracentesis was performed. The catheter was removed and a dressing was applied. The patient tolerated the procedure well without immediate post procedural complication. FINDINGS: A total of approximately 4.5 liters of bloody fluid was removed. IMPRESSION: Successful ultrasound-guided therapeutic paracentesis yielding 4.5 liters of peritoneal fluid. Read by: Brynda Greathouse PA-C Electronically Signed   By: Lucrezia Europe M.D.   On: 12/30/2018 12:23    Labs:  CBC: Recent Labs    01/25/18 1111 04/22/18 1147 12/10/18 1008 12/30/18 1307  WBC 4.1 4.4 4.6 5.6  HGB 10.4* 10.3* 11.7* 12.4  HCT 33.8* 33.4* 37.9 41.2  PLT 223 231 286 352    COAGS: Recent Labs    12/30/18 1307  INR 0.9  APTT 25    BMP: Recent Labs    01/25/18 1111 04/22/18 1147 12/10/18 1008 12/30/18 1307  NA 141 138 139 139  K 4.0 4.4 3.9 4.3  CL 103 103 102 104  CO2 28 27 26 27   GLUCOSE 133* 176* 160* 120*  BUN 15 18 13 17   CALCIUM 9.5 9.5 8.8* 8.8*  CREATININE 0.77 0.82 0.83 0.75  GFRNONAA >60 >60 >60 >60  GFRAA >60 >60 >60 >60    LIVER FUNCTION TESTS: Recent Labs    01/25/18 1111 04/22/18 1147 12/10/18 1008 12/30/18 1307  BILITOT 0.3  0.3 0.4 0.4  AST 17 21 27  37  ALT 21 27 27 25   ALKPHOS 91 93 62 50  PROT 7.2 7.6 7.5 7.3  ALBUMIN 3.9 3.8 3.8 3.7    TUMOR MARKERS: No results for input(s): AFPTM, CEA, CA199, CHROMGRNA in the last 8760 hours.  Assessment and Plan: Ovarian Cancer For Port placement Labs ok Risks and benefits of image guided port-a-catheter placement was discussed with the patient including, but not limited to bleeding, infection, pneumothorax, or fibrin sheath development and need for additional procedures.  All of the patient's questions were answered, patient is agreeable to proceed. Consent signed and in chart.     Thank you for this interesting consult.  I greatly enjoyed meeting Cheryl Melton and look forward to participating in their care.  A copy of this report was sent to the requesting provider on this date.  Electronically Signed: Ascencion Dike, PA-C 12/30/2018, 2:21 PM   I spent a total of 20 minutes in face to face in clinical consultation, greater than 50% of which was counseling/coordinating care for port placement

## 2018-12-30 NOTE — Procedures (Signed)
  Procedure: R IJ Port placement   EBL:   minimal Complications:  none immediate  See full dictation in [facility] PACS.  D. Kedar Sedano MD Main # [phone] Pager  [phone]    

## 2018-12-30 NOTE — Procedures (Signed)
PROCEDURE SUMMARY:  Successful US guided paracentesis from right lateral abdomen.  Yielded 4.5 liters of bloody fluid.  No immediate complications.  Pt tolerated well.   Specimen was not sent for labs.  EBL < 57mL  Docia Barrier PA-C 12/30/2018 2:19 PM

## 2018-12-31 ENCOUNTER — Other Ambulatory Visit: Payer: Self-pay

## 2018-12-31 ENCOUNTER — Inpatient Hospital Stay: Payer: Medicare Other

## 2018-12-31 ENCOUNTER — Encounter (HOSPITAL_COMMUNITY): Payer: Self-pay | Admitting: Interventional Radiology

## 2018-12-31 VITALS — BP 119/67 | HR 94 | Temp 99.1°F | Resp 20

## 2018-12-31 DIAGNOSIS — C561 Malignant neoplasm of right ovary: Secondary | ICD-10-CM

## 2018-12-31 DIAGNOSIS — Z7189 Other specified counseling: Secondary | ICD-10-CM

## 2018-12-31 DIAGNOSIS — Z5111 Encounter for antineoplastic chemotherapy: Secondary | ICD-10-CM | POA: Diagnosis not present

## 2018-12-31 MED ORDER — DIPHENHYDRAMINE HCL 50 MG/ML IJ SOLN
50.0000 mg | Freq: Once | INTRAMUSCULAR | Status: AC
  Administered 2018-12-31: 50 mg via INTRAVENOUS

## 2018-12-31 MED ORDER — SODIUM CHLORIDE 0.9 % IV SOLN
Freq: Once | INTRAVENOUS | Status: AC
  Administered 2018-12-31: 10:00:00 via INTRAVENOUS
  Filled 2018-12-31: qty 5

## 2018-12-31 MED ORDER — PALONOSETRON HCL INJECTION 0.25 MG/5ML
INTRAVENOUS | Status: AC
  Filled 2018-12-31: qty 5

## 2018-12-31 MED ORDER — FAMOTIDINE IN NACL 20-0.9 MG/50ML-% IV SOLN
INTRAVENOUS | Status: AC
  Filled 2018-12-31: qty 50

## 2018-12-31 MED ORDER — SODIUM CHLORIDE 0.9 % IV SOLN
Freq: Once | INTRAVENOUS | Status: AC
  Administered 2018-12-31: 09:00:00 via INTRAVENOUS
  Filled 2018-12-31: qty 250

## 2018-12-31 MED ORDER — SODIUM CHLORIDE 0.9 % IV SOLN
603.0000 mg | Freq: Once | INTRAVENOUS | Status: AC
  Administered 2018-12-31: 600 mg via INTRAVENOUS
  Filled 2018-12-31: qty 60

## 2018-12-31 MED ORDER — FAMOTIDINE IN NACL 20-0.9 MG/50ML-% IV SOLN
20.0000 mg | Freq: Once | INTRAVENOUS | Status: AC
  Administered 2018-12-31: 20 mg via INTRAVENOUS

## 2018-12-31 MED ORDER — SODIUM CHLORIDE 0.9 % IV SOLN
140.0000 mg/m2 | Freq: Once | INTRAVENOUS | Status: AC
  Administered 2018-12-31: 318 mg via INTRAVENOUS
  Filled 2018-12-31: qty 53

## 2018-12-31 MED ORDER — PALONOSETRON HCL INJECTION 0.25 MG/5ML
0.2500 mg | Freq: Once | INTRAVENOUS | Status: AC
  Administered 2018-12-31: 0.25 mg via INTRAVENOUS

## 2018-12-31 MED ORDER — SODIUM CHLORIDE 0.9% FLUSH
10.0000 mL | INTRAVENOUS | Status: DC | PRN
  Administered 2018-12-31: 10 mL
  Filled 2018-12-31: qty 10

## 2018-12-31 MED ORDER — HEPARIN SOD (PORK) LOCK FLUSH 100 UNIT/ML IV SOLN
500.0000 [IU] | Freq: Once | INTRAVENOUS | Status: AC | PRN
  Administered 2018-12-31: 500 [IU]
  Filled 2018-12-31: qty 5

## 2018-12-31 MED ORDER — DIPHENHYDRAMINE HCL 50 MG/ML IJ SOLN
INTRAMUSCULAR | Status: AC
  Filled 2018-12-31: qty 1

## 2018-12-31 NOTE — Patient Instructions (Signed)
[facility] Discharge Instructions for Patients Receiving Chemotherapy  Today you received the following chemotherapy agents: Paclitaxel (Taxol) and Carboplatin (Paraplatin)  To help prevent nausea and vomiting after your treatment, we encourage you to take your nausea medication as directed. Received Aloxi during your treatment today-->For the next 3 days take Compazine (not Zofran) for nausea if needed.    If you develop nausea and vomiting that is not controlled by your nausea medication, call the clinic.   BELOW ARE SYMPTOMS THAT SHOULD BE REPORTED IMMEDIATELY:  *FEVER GREATER THAN 100.5 F  *CHILLS WITH OR WITHOUT FEVER  NAUSEA AND VOMITING THAT IS NOT CONTROLLED WITH YOUR NAUSEA MEDICATION  *UNUSUAL SHORTNESS OF BREATH  *UNUSUAL BRUISING OR BLEEDING  TENDERNESS IN MOUTH AND THROAT WITH OR WITHOUT PRESENCE OF ULCERS  *URINARY PROBLEMS  *BOWEL PROBLEMS  UNUSUAL RASH Items with * indicate a potential emergency and should be followed up as soon as possible.  Feel free to call the clinic should you have any questions or concerns. The clinic phone number is [phone].  Please show the [facility] at check-in to the Emergency Department and triage nurse.  Coronavirus (COVID-19) Are you at risk?  Are you at risk for the Coronavirus (COVID-19)?  To be considered HIGH RISK for Coronavirus (COVID-19), you have to meet the following criteria:  . Traveled to [place], [place], [place], [place] or [place]; or in the [place] to [place], [place], [place], or [place]; and have fever, cough, and shortness of breath within the last 2 weeks of travel OR . Been in close contact with a person diagnosed with COVID-19 within the last 2 weeks and have fever, cough, and shortness of breath . IF YOU DO NOT MEET THESE CRITERIA, YOU ARE CONSIDERED LOW RISK FOR COVID-19.  What to do if you are HIGH RISK for COVID-19?  Marland Kitchen If you are having a medical emergency,  call 911. . Seek medical care right away. Before you go to a doctor's office, urgent care or emergency department, call ahead and tell them about your recent travel, contact with someone diagnosed with COVID-19, and your symptoms. You should receive instructions from your physician's office regarding next steps of care.  . When you arrive at healthcare provider, tell the healthcare staff immediately you have returned from visiting [place], [place], [place], [place] or [place]; or traveled in the [place] to [place], [place], [place], or [place]; in the last two weeks or you have been in close contact with a person diagnosed with COVID-19 in the last 2 weeks.   . Tell the health care staff about your symptoms: fever, cough and shortness of breath. . After you have been seen by a medical provider, you will be either: o Tested for (COVID-19) and discharged home on quarantine except to seek medical care if symptoms worsen, and asked to  - Stay home and avoid contact with others until you get your results (4-5 days)  - Avoid travel on public transportation if possible (such as bus, train, or airplane) or o Sent to the Emergency Department by EMS for evaluation, COVID-19 testing, and possible admission depending on your condition and test results.  What to do if you are LOW RISK for COVID-19?  Reduce your risk of any infection by using the same precautions used for avoiding the common cold or flu:  Marland Kitchen Wash your hands often with soap and warm water for at least 20 seconds.  If  soap and water are not readily available, use an alcohol-based hand sanitizer with at least 60% alcohol.  . If coughing or sneezing, cover your mouth and nose by coughing or sneezing into the elbow areas of your shirt or coat, into a tissue or into your sleeve (not your hands). . Avoid shaking hands with others and consider head nods or verbal greetings only. . Avoid touching your eyes, nose, or mouth with unwashed hands.   . Avoid close contact with people who are sick. . Avoid places or events with large numbers of people in one location, like concerts or sporting events. . Carefully consider travel plans you have or are making. . If you are planning any travel outside or inside the [place], visit the CDC's Travelers' Health webpage for the latest health notices. . If you have some symptoms but not all symptoms, continue to monitor at home and seek medical attention if your symptoms worsen. . If you are having a medical emergency, call 911.   [place] / e-Visit: [email or web address]         [facility]: [facility]: [phone]                   [facility]: [phone]

## 2019-01-21 ENCOUNTER — Other Ambulatory Visit: Payer: Self-pay

## 2019-01-21 ENCOUNTER — Other Ambulatory Visit: Payer: Self-pay | Admitting: Hematology and Oncology

## 2019-01-21 ENCOUNTER — Inpatient Hospital Stay: Payer: Medicare Other

## 2019-01-21 ENCOUNTER — Encounter: Payer: Self-pay | Admitting: Hematology and Oncology

## 2019-01-21 ENCOUNTER — Inpatient Hospital Stay: Payer: Medicare Other | Attending: Gynecology

## 2019-01-21 ENCOUNTER — Inpatient Hospital Stay (HOSPITAL_BASED_OUTPATIENT_CLINIC_OR_DEPARTMENT_OTHER): Payer: Medicare Other | Admitting: Hematology and Oncology

## 2019-01-21 VITALS — BP 118/54 | HR 87

## 2019-01-21 DIAGNOSIS — D6481 Anemia due to antineoplastic chemotherapy: Secondary | ICD-10-CM | POA: Diagnosis not present

## 2019-01-21 DIAGNOSIS — Z7189 Other specified counseling: Secondary | ICD-10-CM

## 2019-01-21 DIAGNOSIS — Z5111 Encounter for antineoplastic chemotherapy: Secondary | ICD-10-CM | POA: Insufficient documentation

## 2019-01-21 DIAGNOSIS — C561 Malignant neoplasm of right ovary: Secondary | ICD-10-CM | POA: Diagnosis present

## 2019-01-21 DIAGNOSIS — E119 Type 2 diabetes mellitus without complications: Secondary | ICD-10-CM | POA: Diagnosis not present

## 2019-01-21 DIAGNOSIS — Z5112 Encounter for antineoplastic immunotherapy: Secondary | ICD-10-CM | POA: Diagnosis not present

## 2019-01-21 LAB — CMP (CANCER CENTER ONLY)
ALT: 28 U/L (ref 0–44)
AST: 26 U/L (ref 15–41)
Albumin: 3.4 g/dL — ABNORMAL LOW (ref 3.5–5.0)
Alkaline Phosphatase: 58 U/L (ref 38–126)
Anion gap: 14 (ref 5–15)
BUN: 17 mg/dL (ref 8–23)
CO2: 24 mmol/L (ref 22–32)
Calcium: 8.7 mg/dL — ABNORMAL LOW (ref 8.9–10.3)
Chloride: 104 mmol/L (ref 98–111)
Creatinine: 0.74 mg/dL (ref 0.44–1.00)
GFR, Est AFR Am: >60 mL/min (ref >60)
GFR, Estimated: >60 mL/min (ref >60)
Glucose, Bld: 219 mg/dL — ABNORMAL HIGH (ref 70–99)
Potassium: 3.9 mmol/L (ref 3.5–5.1)
Sodium: 142 mmol/L (ref 135–145)
Total Bilirubin: 0.3 mg/dL (ref 0.3–1.2)
Total Protein: 7 g/dL (ref 6.5–8.1)

## 2019-01-21 LAB — CBC WITH DIFFERENTIAL (CANCER CENTER ONLY)
Abs Immature Granulocytes: 0.04 10*3/uL (ref 0.00–0.07)
Basophils Absolute: 0 10*3/uL (ref 0.0–0.1)
Basophils Relative: 0 %
Eosinophils Absolute: 0 10*3/uL (ref 0.0–0.5)
Eosinophils Relative: 0 %
HCT: 33.7 % — ABNORMAL LOW (ref 36.0–46.0)
Hemoglobin: 10.6 g/dL — ABNORMAL LOW (ref 12.0–15.0)
Immature Granulocytes: 1 %
Lymphocytes Relative: 12 %
Lymphs Abs: 0.6 10*3/uL — ABNORMAL LOW (ref 0.7–4.0)
MCH: 29.1 pg (ref 26.0–34.0)
MCHC: 31.5 g/dL (ref 30.0–36.0)
MCV: 92.6 fL (ref 80.0–100.0)
Monocytes Absolute: 0.1 10*3/uL (ref 0.1–1.0)
Monocytes Relative: 3 %
Neutro Abs: 4.1 10*3/uL (ref 1.7–7.7)
Neutrophils Relative %: 84 %
Platelet Count: 283 10*3/uL (ref 150–400)
RBC: 3.64 MIL/uL — ABNORMAL LOW (ref 3.87–5.11)
RDW: 15.4 % (ref 11.5–15.5)
WBC Count: 4.9 10*3/uL (ref 4.0–10.5)
nRBC: 0 % (ref 0.0–0.2)

## 2019-01-21 LAB — TOTAL PROTEIN, URINE DIPSTICK: Protein, ur: <30 mg/dL — AB

## 2019-01-21 MED ORDER — SODIUM CHLORIDE 0.9 % IV SOLN
Freq: Once | INTRAVENOUS | Status: AC
  Administered 2019-01-21: 10:00:00 via INTRAVENOUS
  Filled 2019-01-21: qty 5

## 2019-01-21 MED ORDER — SODIUM CHLORIDE 0.9 % IV SOLN
14.7000 mg/kg | Freq: Once | INTRAVENOUS | Status: AC
  Administered 2019-01-21: 1600 mg via INTRAVENOUS
  Filled 2019-01-21: qty 64

## 2019-01-21 MED ORDER — SODIUM CHLORIDE 0.9% FLUSH
10.0000 mL | Freq: Once | INTRAVENOUS | Status: AC
  Administered 2019-01-21: 10 mL
  Filled 2019-01-21: qty 10

## 2019-01-21 MED ORDER — DIPHENHYDRAMINE HCL 50 MG/ML IJ SOLN
INTRAMUSCULAR | Status: AC
  Filled 2019-01-21: qty 1

## 2019-01-21 MED ORDER — FAMOTIDINE IN NACL 20-0.9 MG/50ML-% IV SOLN
INTRAVENOUS | Status: AC
  Filled 2019-01-21: qty 50

## 2019-01-21 MED ORDER — DIPHENHYDRAMINE HCL 50 MG/ML IJ SOLN
50.0000 mg | Freq: Once | INTRAMUSCULAR | Status: AC
  Administered 2019-01-21: 50 mg via INTRAVENOUS

## 2019-01-21 MED ORDER — PALONOSETRON HCL INJECTION 0.25 MG/5ML
0.2500 mg | Freq: Once | INTRAVENOUS | Status: AC
  Administered 2019-01-21: 10:00:00 0.25 mg via INTRAVENOUS

## 2019-01-21 MED ORDER — SODIUM CHLORIDE 0.9 % IV SOLN
603.0000 mg | Freq: Once | INTRAVENOUS | Status: AC
  Administered 2019-01-21: 15:00:00 600 mg via INTRAVENOUS
  Filled 2019-01-21: qty 60

## 2019-01-21 MED ORDER — FAMOTIDINE IN NACL 20-0.9 MG/50ML-% IV SOLN
20.0000 mg | Freq: Once | INTRAVENOUS | Status: AC
  Administered 2019-01-21: 10:00:00 20 mg via INTRAVENOUS

## 2019-01-21 MED ORDER — PALONOSETRON HCL INJECTION 0.25 MG/5ML
INTRAVENOUS | Status: AC
  Filled 2019-01-21: qty 5

## 2019-01-21 MED ORDER — HEPARIN SOD (PORK) LOCK FLUSH 100 UNIT/ML IV SOLN
500.0000 [IU] | Freq: Once | INTRAVENOUS | Status: AC | PRN
  Administered 2019-01-21: 500 [IU]
  Filled 2019-01-21: qty 5

## 2019-01-21 MED ORDER — SODIUM CHLORIDE 0.9 % IV SOLN
140.0000 mg/m2 | Freq: Once | INTRAVENOUS | Status: AC
  Administered 2019-01-21: 12:00:00 318 mg via INTRAVENOUS
  Filled 2019-01-21: qty 53

## 2019-01-21 MED ORDER — SODIUM CHLORIDE 0.9 % IV SOLN
Freq: Once | INTRAVENOUS | Status: AC
  Administered 2019-01-21: 09:00:00 via INTRAVENOUS
  Filled 2019-01-21: qty 250

## 2019-01-21 MED ORDER — SODIUM CHLORIDE 0.9% FLUSH
10.0000 mL | INTRAVENOUS | Status: DC | PRN
  Administered 2019-01-21: 16:00:00 10 mL
  Filled 2019-01-21: qty 10

## 2019-01-21 NOTE — Assessment & Plan Note (Signed)
She is at high risk of severe hyperglycemia during treatment I recommend close follow-up with primary care doctor and advised her to check her blood sugar regularly the first few days after treatment

## 2019-01-21 NOTE — Assessment & Plan Note (Signed)
Overall, she tolerated recent treatment well Tumor markers pending She does not feel that she needed repeat paracentesis We will introduce additional treatment with bevacizumab and she agreed with the plan of care I have advised her to check her blood pressure twice a day and to call me if systolic blood pressure greater than 003 or diastolic blood pressure greater than 95

## 2019-01-21 NOTE — Progress Notes (Signed)
[facility] OFFICE PROGRESS NOTE  Patient Care Team: Lilian Coma., MD as PCP - General (Internal Medicine)  ASSESSMENT & PLAN:  Ovarian cancer on right ([place]) Overall, she tolerated recent treatment well Tumor markers pending She does not feel that she needed repeat paracentesis We will introduce additional treatment with bevacizumab and she agreed with the plan of care I have advised her to check her blood pressure twice a day and to call me if systolic blood pressure greater than 989 or diastolic blood pressure greater than 95  Type 2 diabetes mellitus without complication ([place]) She is at high risk of severe hyperglycemia during treatment I recommend close follow-up with primary care doctor and advised her to check her blood sugar regularly the first few days after treatment  Anemia due to antineoplastic chemotherapy This is likely due to recent treatment. The patient denies recent history of bleeding such as epistaxis, hematuria or hematochezia. She is asymptomatic from the anemia. I will observe for now.  She does not require transfusion now. I will continue the chemotherapy at current dose without dosage adjustment.  If the anemia gets progressive worse in the future, I might have to delay her treatment or adjust the chemotherapy dose.     No orders of the defined types were placed in this encounter.   INTERVAL HISTORY: Please see below for problem oriented charting. 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    03/20/2017 Imaging   GYN ultrasound was performed this revealed a small fundus measuring 5 cm there are 2 small fibroids within the fundus with the largest measuring 1.3 cm Within the endometrium there is a small amount a fluid the endometrial wall was slightly thickened at 4.8 mm the ultrasound was consistent with a small endometrial polyp  The right adnexa measured 7.4 x 6.7 cm contained a both a cystic and solid mass with a small amount of shadowing This is consistent with a dermoid tumor the left ovary was 2.6 x 1.5 x 1.0 cm There is no other adnexal masses no free fluid within the abdomen no ascites     04/10/2017 Surgery   Operation: Operative Laparscopy with  Peritoneal biopsy 2 hysteroscopy D&C Surgeon: Lynder Parents MD  Specimens: Peritoneal biopsy and cellular washings  Complications: None FIndings Small amount of ascites was found cellular washings were sent There was peritoneal studding And omental caking Even in steep Trendelenburg the ovaries and uterus were unable to be visualized due to the small intestines in the lower pelvis     04/10/2017 Pathology Results   Biopsy from [facility] was nondiagnostic.  Peritoneum biopsy did not reveal malignancy.  Endometrial biopsy also did not reveal malignancy.   04/13/2017 Tumor Marker   Patient's tumor was tested for the following markers: CA-125 Results of the tumor marker test revealed 324.1   04/20/2017 Tumor Marker   Patient's tumor was tested for the following  markers: CA-125 Results of the tumor marker test revealed 345.6   04/22/2017 Pathology Results   Peritoneum, biopsy - CARCINOMA. - SEE COMMENT.   04/22/2017 Procedure   CT-guided core biopsy performed of peritoneal tumor in the left lower quadrant.   04/24/2017 - 09/25/2017 Chemotherapy   She received  carboplatin and Taxol x 3 cycles followed by interval surgical debulking on 12/31. Chemotherapy was subsequently resumed for 3 more cycles   06/05/2017 Tumor Marker   Patient's tumor was tested for the following markers: CA-125 Results of the tumor marker test revealed 27.5   06/17/2017 Imaging   1. Considerable improvement in the prior omental caking, which now presents mainly as an indistinct stranding with slight nodularity along the left upper quadrant omentum. 2. Stable appearance of right ovarian mass with fatty and calcific elements classic for a dermoid. 3. There is accentuated enhancement in the walls of the common hepatic duct and common bile duct. A low-grade cholangitis is not readily excluded. 4. Other imaging findings of potential clinical significance: Mild cardiomegaly. Aortic Atherosclerosis (ICD10-I70.0). Lumbar scoliosis, spondylosis, degenerative disc disease, and pars defects at L5. These cause impingement at L5-S1 and lesser impingement at L3-4 and L4-5. 5. 65% compression fracture at L1 slightly worsened than on the prior exam, with associated vertebral sclerosis and posterior retropulsion.   07/06/2017 Surgery   Bilateral Salpingo-Oophorectomy W/Omentectomy, Total Abd Hysterectomy & Radical Dissection For Debulking Right - Ureterolysis, With Or Without Repositioning Of Ureter For Retroperitoneal Fibrosis   07/06/2017 Pathology Results   A: Omentum, omentectomy - Omentum with minimal residual carcinoma, consistent with high grade serous carcinoma (microscopic; stage ypT3a) - Extensive treatment effect with calcifications, fibrosis, histiocytes, and giant cell response - CRS score 3  B: Uterus with cervix and left ovary and fallopian tube, hysterectomy and left salpingo-oophorectomy - Cervix: Ectocervix and endocervix with no dysplasia or malignancy identified - Endometrium: Inactive with no hyperplasia, atypia, or malignancy identified - Myometrium: Adenomyosis and  benign leiomyoma, size 1.2 cm - Serosa: Focal calcification and histiocytes suggestive of treatment effect, with no viable carcinoma identified - Ovary, left: Physiologic changes and no carcinoma identified - Fallopian tube, left: 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 and no malignancy identified  C: Ovary and fallopian tube, right, salpingo-oophorectomy - Right fallopian tube with focal residual carcinoma, consistent with high grade serous carcinoma, and serous tubal intraepithelial carcinoma (STIC) - Associated calcifications and histiocytes suggestive of treatment response - Right ovary with calcifications and histiocytes suggestive of treatment effect, with no definite viable carcinoma identified - Mature cystic teratoma, 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 - See synoptic report and comment   08/14/2017 Tumor Marker   Patient's tumor was tested for the following markers: CA-125 Results of the tumor marker test revealed 14.6   09/04/2017 Tumor Marker   Patient's tumor was tested for the following markers: CA-125 Results of the tumor marker test revealed 12.5   10/03/2017 Genetic Testing   The Common Hereditary Cancer Panel offered by [facility] includes sequencing and/or deletion duplication testing of the following 47 genes: APC, ATM, AXIN2, BARD1, BMPR1A, BRCA1, BRCA2, BRIP1, CDH1, CDKN2A (p14ARF), CDKN2A (p16INK4a), CKD4, CHEK2, CTNNA1, DICER1, EPCAM (Deletion/duplication testing only), GREM1 (promoter region deletion/duplication testing only), KIT, MEN1, MLH1, MSH2, MSH3, MSH6, MUTYH, NBN, NF1, NHTL1, PALB2, PDGFRA, PMS2, POLD1, POLE, PTEN, RAD50, RAD51C, RAD51D, SDHB, SDHC, SDHD, SMAD4, SMARCA4. STK11, TP53, TSC1, TSC2, and VHL.  The following genes were evaluated for sequence changes only: SDHA and HOXB13 c.251G>A variant only.  Results: Negative, no pathogenic variants  identified.  The date of this test report is 10/03/2017.    10/28/2017 Imaging   1. Stable pulmonary nodules. No new or progressive  findings. 2. No residual or recurrent omental disease is identified. No obvious peritoneal surface disease. 3. Increasing soft tissue density and ill-defined interstitial changes around the abdominal wall surgical incision. This may be progressive postoperative changes or granulation tissue but attention on future scans to exclude the possibility of tumor. 4. Status post resection of right adnexal mass. No pelvic mass or pelvic adenopathy. 5. Stable T12 compression fracture.   01/25/2018 Tumor Marker   Patient's tumor was tested for the following markers: CA-125 Results of the tumor marker test revealed 9.2   12/10/2018 Tumor Marker   Patient's tumor was tested for the following markers: CA-125 Results of the tumor marker test revealed 212   12/22/2018 Imaging   1. Interval development of multiple soft tissue lesions in the gastrocolic ligament, mesentery, and along the peritoneal surface. These soft tissue lesions measure up to 6.5 cm in size. Associated interval development of moderate volume ascites in the abdomen and pelvis. Imaging features are consistent with metastatic disease. 2. Soft tissue projects through the rectus sheath in the right paraumbilical region, likely herniated small bowel as there is a large amount of decompressed small bowel does deep to this finding. However, metastatic deposit at this location is also possibility.  3. Trace pneumobilia in the left liver suggests prior sphincterotomy. 4.  Aortic Atherosclerois (ICD10-170.0)   12/30/2018 Procedure   Successful ultrasound-guided therapeutic paracentesis yielding 4.5 liters of peritoneal fluid.   12/30/2018 Procedure   Technically successful right IJ power-injectable port catheter placement. Ready for routine use.   12/31/2018 -  Chemotherapy   The patient had carboplatin and taxol for chemotherapy treatment.       REVIEW OF SYSTEMS:   Constitutional: Denies fevers, chills or abnormal weight loss Eyes: Denies  blurriness of vision Ears, nose, mouth, throat, and face: Denies mucositis or sore throat Respiratory: Denies cough, dyspnea or wheezes Cardiovascular: Denies palpitation, chest discomfort or lower extremity swelling Gastrointestinal:  Denies nausea, heartburn or change in bowel habits Skin: Denies abnormal skin rashes Lymphatics: Denies new lymphadenopathy or easy bruising Neurological:Denies numbness, tingling or new weaknesses Behavioral/Psych: Mood is stable, no new changes  All other systems were reviewed with the patient and are negative.  I have reviewed the past medical history, past surgical history, social history and family history with the patient and they are unchanged from previous note.  ALLERGIES:  is allergic to aspirin; nsaids; and tramadol.  MEDICATIONS:  Current Outpatient Medications  Medication Sig Dispense Refill  . acetaminophen (TYLENOL) 650 MG CR tablet Take 1,300 mg by mouth every 8 (eight) hours.     Marland Kitchen alendronate (FOSAMAX) 70 MG tablet Take 70 mg by mouth every Sunday.     Marland Kitchen atorvastatin (LIPITOR) 80 MG tablet Take 40 mg by mouth at bedtime.     . Cholecalciferol (VITAMIN D3) 2000 units TABS Take 2,000 Units by mouth daily.    . citalopram (CELEXA) 10 MG tablet Take 10 mg by mouth at bedtime    . dexamethasone (DECADRON) 4 MG tablet Take 3 tabs at the night before and 3 tabs the morning of chemotherapy, every 3 weeks, by mouth 36 tablet 0  . ferrous sulfate 325 (65 FE) MG tablet Take 325 mg by mouth daily. 3 hours after meal and 30 min before a meal    . gabapentin (NEURONTIN) 300 MG capsule Take  by mouth.    . Glucosamine Sulfate 500 MG TABS Take 500 mg by mouth 2 (two) times daily.     Marland Kitchen lidocaine-prilocaine (EMLA) cream Apply to affected area once 30 g 3  . loratadine (CLARITIN) 10 MG tablet Take 10 mg by mouth at bedtime.     Marland Kitchen losartan (COZAAR) 25 MG tablet Take 25 mg by mouth daily.     . meclizine (ANTIVERT) 25 MG tablet Take 25 mg by mouth 2 (two)  times daily as needed for dizziness.     . metFORMIN (GLUCOPHAGE) 1000 MG tablet Take 1,000 mg by mouth 2 (two) times daily with a meal.     . omeprazole (PRILOSEC) 40 MG capsule Take 40 mg by mouth daily.     . ondansetron (ZOFRAN) 8 MG tablet Take 1 tablet (8 mg total) by mouth every 8 (eight) hours as needed for refractory nausea / vomiting. 30 tablet 1  . pioglitazone (ACTOS) 15 MG tablet Take 15 mg by mouth at bedtime.     . prochlorperazine (COMPAZINE) 10 MG tablet Take 1 tablet (10 mg total) by mouth every 6 (six) hours as needed (Nausea or vomiting). 30 tablet 1  . Pyridoxine HCl (VITAMIN B-6 PO) Take 50 mg by mouth 1 day or 1 dose.    . vitamin B-12 (CYANOCOBALAMIN) 1000 MCG tablet Take 1,000 mcg by mouth daily.      No current facility-administered medications for this visit.    Facility-Administered Medications Ordered in Other Visits  Medication Dose Route Frequency Provider Last Rate Last Dose  . CARBOplatin (PARAPLATIN) 600 mg in sodium chloride 0.9 % 250 mL chemo infusion  600 mg Intravenous Once [name], Alfonse Garringer, MD      . heparin lock flush 100 unit/mL  500 Units Intracatheter Once PRN [name], Florabel Faulks, MD      . PACLitaxel (TAXOL) 318 mg in sodium chloride 0.9 % 500 mL chemo infusion (> 62m/m2)  140 mg/m2 (Treatment Plan Recorded) Intravenous Once Ahmir Bracken, MD      . sodium chloride flush (NS) 0.9 % injection 10 mL  10 mL Intracatheter Once Charde Macfarlane, MD      . sodium chloride flush (NS) 0.9 % injection 10 mL  10 mL Intracatheter PRN GAlvy Bimler Bane Hagy, MD        PHYSICAL EXAMINATION: ECOG PERFORMANCE STATUS: 1 - Symptomatic but completely ambulatory  Vitals:   01/21/19 0836  BP: 129/67  Pulse: (!) 103  Resp: 18  Temp: 98 F (36.7 C)  SpO2: 99%   Filed Weights   01/21/19 0836  Weight: 242 lb 3.2 oz (109.9 kg)    GENERAL:alert, no distress and comfortable.  Limited exam due to body habitus SKIN: skin color, texture, turgor are normal, no rashes or significant  lesions EYES: normal, Conjunctiva are pink and non-injected, sclera clear OROPHARYNX:no exudate, no erythema and lips, buccal mucosa, and tongue normal  NECK: supple, thyroid normal size, non-tender, without nodularity LYMPH:  no palpable lymphadenopathy in the cervical, axillary or inguinal LUNGS: clear to auscultation and percussion with normal breathing effort HEART: regular rate & rhythm and no murmurs and no lower extremity edema ABDOMEN:abdomen soft, non-tender and normal bowel sounds Musculoskeletal:no cyanosis of digits and no clubbing  NEURO: alert & oriented x 3 with fluent speech, no focal motor/sensory deficits  LABORATORY DATA:  I have reviewed the data as listed    Component Value Date/Time   NA 142 01/21/2019 0808   NA 139 06/05/2017 0746   K 3.9  01/21/2019 0808   K 4.1 06/05/2017 0746   CL 104 01/21/2019 0808   CO2 24 01/21/2019 0808   CO2 21 (L) 06/05/2017 0746   GLUCOSE 219 (H) 01/21/2019 0808   GLUCOSE 279 (H) 06/05/2017 0746   BUN 17 01/21/2019 0808   BUN 15.0 06/05/2017 0746   CREATININE 0.74 01/21/2019 0808   CREATININE 0.9 06/05/2017 0746   CALCIUM 8.7 (L) 01/21/2019 0808   CALCIUM 10.1 06/05/2017 0746   PROT 7.0 01/21/2019 0808   PROT 8.1 06/05/2017 0746   ALBUMIN 3.4 (L) 01/21/2019 0808   ALBUMIN 4.0 06/05/2017 0746   AST 26 01/21/2019 0808   AST 22 06/05/2017 0746   ALT 28 01/21/2019 0808   ALT 36 06/05/2017 0746   ALKPHOS 58 01/21/2019 0808   ALKPHOS 98 06/05/2017 0746   BILITOT 0.3 01/21/2019 0808   BILITOT 0.44 06/05/2017 0746   GFRNONAA >60 01/21/2019 0808   GFRAA >60 01/21/2019 0808    No results found for: SPEP, UPEP  Lab Results  Component Value Date   WBC 4.9 01/21/2019   NEUTROABS 4.1 01/21/2019   HGB 10.6 (L) 01/21/2019   HCT 33.7 (L) 01/21/2019   MCV 92.6 01/21/2019   PLT 283 01/21/2019      Chemistry      Component Value Date/Time   NA 142 01/21/2019 0808   NA 139 06/05/2017 0746   K 3.9 01/21/2019 0808   K 4.1  06/05/2017 0746   CL 104 01/21/2019 0808   CO2 24 01/21/2019 0808   CO2 21 (L) 06/05/2017 0746   BUN 17 01/21/2019 0808   BUN 15.0 06/05/2017 0746   CREATININE 0.74 01/21/2019 0808   CREATININE 0.9 06/05/2017 0746      Component Value Date/Time   CALCIUM 8.7 (L) 01/21/2019 0808   CALCIUM 10.1 06/05/2017 0746   ALKPHOS 58 01/21/2019 0808   ALKPHOS 98 06/05/2017 0746   AST 26 01/21/2019 0808   AST 22 06/05/2017 0746   ALT 28 01/21/2019 0808   ALT 36 06/05/2017 0746   BILITOT 0.3 01/21/2019 0808   BILITOT 0.44 06/05/2017 0746       RADIOGRAPHIC STUDIES: I have personally reviewed the radiological images as listed and agreed with the findings in the report. Ct Abdomen Pelvis W Contrast  Result Date: 12/22/2018 CLINICAL DATA:  Ovarian cancer. EXAM: CT ABDOMEN AND PELVIS WITH CONTRAST TECHNIQUE: Multidetector CT imaging of the abdomen and pelvis was performed using the standard protocol following bolus administration of intravenous contrast. CONTRAST:  178m OMNIPAQUE IOHEXOL 300 MG/ML  SOLN COMPARISON:  10/28/2017. FINDINGS: Lower chest: Coronary artery calcification is evident. Hepatobiliary: No suspicious focal abnormality within the liver parenchyma. Mild intrahepatic biliary duct prominence noted. Trace pneumobilia in the left liver suggests prior sphincterotomy. Gallbladder surgically absent. Pancreas: Pancreas diffusely atrophic. Spleen: No splenomegaly. No focal mass lesion. Adrenals/Urinary Tract: No adrenal nodule or mass. Kidneys unremarkable. No evidence for hydroureter. Bladder is nondistended. Stomach/Bowel: Tiny hiatal hernia. Stomach otherwise unremarkable. Duodenum is normally positioned as is the ligament of Treitz. No small bowel wall thickening. No small bowel dilatation. The terminal ileum is normal. The appendix is normal. No gross colonic mass. No colonic wall thickening. Vascular/Lymphatic: There is abdominal aortic atherosclerosis without aneurysm. There is no  gastrohepatic or hepatoduodenal ligament lymphadenopathy. No intraperitoneal or retroperitoneal lymphadenopathy. No pelvic sidewall lymphadenopathy. Reproductive: The uterus is surgically absent. There is no adnexal mass. Other: Interval development of moderate ascites around the liver and spleen with moderate fluid in the pelvis.  Nodular soft tissue is identified in the gastrocolic ligament including a confluent 6.5 x 2.6 cm soft tissue lesion on 36/2. Multiple mesenteric and peritoneal soft tissue nodules are new in the interval. 2.1 x 2.7 cm peritoneal nodule is seen in the right pelvis on 64/2. Multiple nodules are seen along the left paracolic gutter (69/6) and in the peritoneum along the right paracolic gutter (29/5). 2.6 x 3.9 cm soft tissue lesion is identified in the right pelvis on 71/2 and a 2.4 x 2.9 cm lesion is noted in the left cul-de-sac on 75/2. A 3.7 x 3.4 cm lesion is noted along the bladder dome (78/2). Musculoskeletal: Soft tissue protruding through the paraumbilical rectus sheath (74/2) is probably decompressed small bowel, but metastatic anterior abdominal wall deposit not excluded. No worrisome lytic or sclerotic osseous abnormality. Bilateral pars interarticularis defects are noted at L5. Stable inferior endplate compression fracture at T12. IMPRESSION: 1. Interval development of multiple soft tissue lesions in the gastrocolic ligament, mesentery, and along the peritoneal surface. These soft tissue lesions measure up to 6.5 cm in size. Associated interval development of moderate volume ascites in the abdomen and pelvis. Imaging features are consistent with metastatic disease. 2. Soft tissue projects through the rectus sheath in the right paraumbilical region, likely herniated small bowel as there is a large amount of decompressed small bowel does deep to this finding. However, metastatic deposit at this location is also possibility. 3. Trace pneumobilia in the left liver suggests prior  sphincterotomy. 4.  Aortic Atherosclerois (ICD10-170.0) Electronically Signed   By: Misty Stanley M.D.   On: 12/22/2018 16:41   US Paracentesis  Result Date: 12/30/2018 INDICATION: Patient with abdominal distention, ascites. Request is made for therapeutic paracentesis. EXAM: ULTRASOUND GUIDED THERAPEUTIC PARACENTESIS MEDICATIONS: 10 mL 1% lidocaine COMPLICATIONS: None immediate. PROCEDURE: Informed written consent was obtained from the patient after a discussion of the risks, benefits and alternatives to treatment. A timeout was performed prior to the initiation of the procedure. Initial ultrasound scanning demonstrates a moderate amount of ascites within the right lower abdominal quadrant. The right lower abdomen was prepped and draped in the usual sterile fashion. 1% lidocaine was used for local anesthesia. Following this, a 19 gauge, 7-cm, Yueh catheter was introduced. An ultrasound image was saved for documentation purposes. The paracentesis was performed. The catheter was removed and a dressing was applied. The patient tolerated the procedure well without immediate post procedural complication. FINDINGS: A total of approximately 4.5 liters of bloody fluid was removed. IMPRESSION: Successful ultrasound-guided therapeutic paracentesis yielding 4.5 liters of peritoneal fluid. Read by: Brynda Greathouse PA-C Electronically Signed   By: Lucrezia Europe M.D.   On: 12/30/2018 12:23   Ir Imaging Guided Port Insertion  Result Date: 12/31/2018 CLINICAL DATA:  Ovarian carcinoma, needs durable central venous access for planned chemotherapy regimen EXAM: TUNNELED PORT CATHETER PLACEMENT WITH ULTRASOUND AND FLUOROSCOPIC GUIDANCE FLUOROSCOPY TIME:  0.2 minutes; 98  uGym2 DAP ANESTHESIA/SEDATION: Intravenous Fentanyl 157mg and Versed 1.534mwere administered as conscious sedation during continuous monitoring of the patient's level of consciousness and physiological / cardiorespiratory status by the radiology RN, with a total  moderate sedation time of 19 minutes. TECHNIQUE: The procedure, risks, benefits, and alternatives were explained to the patient. Questions regarding the procedure were encouraged and answered. The patient understands and consents to the procedure. As antibiotic prophylaxis, cefazolin 2 g was ordered pre-procedure and administered intravenously within one hour of incision. patency of the right IJ vein was confirmed with ultrasound with image documentation.  An appropriate skin site was determined. Skin site was marked. Region was prepped using maximum barrier technique including cap and mask, sterile gown, sterile gloves, large sterile sheet, and Chlorhexidine as cutaneous antisepsis. The region was infiltrated locally with 1% lidocaine. Under real-time ultrasound guidance, the right IJ vein was accessed with a 21 gauge micropuncture needle; the needle tip within the vein was confirmed with ultrasound image documentation. Needle was exchanged over a 018 guidewire for transitional dilator which allowed passage of the [facility] wire into the IVC. Over this, the transitional dilator was exchanged for a 5 [place] MPA catheter. A small incision was made on the right anterior chest wall and a subcutaneous pocket fashioned. The power-injectable port was positioned and its catheter tunneled to the right IJ dermatotomy site. The MPA catheter was exchanged over an Amplatz wire for a peel-away sheath, through which the port catheter, which had been trimmed to the appropriate length, was advanced and positioned under fluoroscopy with its tip at the cavoatrial junction. Spot chest radiograph confirms good catheter position and no pneumothorax. The pocket was closed with deep interrupted and subcuticular continuous 3-0 Monocryl sutures. The port was flushed per protocol. The incisions were covered with Dermabond then covered with a sterile dressing. COMPLICATIONS: COMPLICATIONS None immediate IMPRESSION: Technically successful right IJ  power-injectable port catheter placement. Ready for routine use. Electronically Signed   By: Lucrezia Europe M.D.   On: 12/31/2018 07:26    All questions were answered. The patient knows to call the clinic with any problems, questions or concerns. No barriers to learning was detected.  I spent 15 minutes counseling the patient face to face. The total time spent in the appointment was 20 minutes and more than 50% was on counseling and review of test results  Heath Lark, MD 01/21/2019 12:00 PM

## 2019-01-21 NOTE — Assessment & Plan Note (Signed)

## 2019-01-21 NOTE — Patient Instructions (Addendum)
Coronavirus (COVID-19) Are you at risk?  Are you at risk for the Coronavirus (COVID-19)?  To be considered HIGH RISK for Coronavirus (COVID-19), you have to meet the following criteria:  . Traveled to [place], [place], [place], [place] or [place]; or in the [place] to [place], [place], [place], or [place]; and have fever, cough, and shortness of breath within the last 2 weeks of travel OR . Been in close contact with a person diagnosed with COVID-19 within the last 2 weeks and have fever, cough, and shortness of breath . IF YOU DO NOT MEET THESE CRITERIA, YOU ARE CONSIDERED LOW RISK FOR COVID-19.  What to do if you are HIGH RISK for COVID-19?  . If you are having a medical emergency, call 911. . Seek medical care right away. Before you go to a doctor's office, urgent care or emergency department, call ahead and tell them about your recent travel, contact with someone diagnosed with COVID-19, and your symptoms. You should receive instructions from your physician's office regarding next steps of care.  . When you arrive at healthcare provider, tell the healthcare staff immediately you have returned from visiting [place], [place], [place], [place] or [place]; or traveled in the [place] to [place], [place], [place], or [place]; in the last two weeks or you have been in close contact with a person diagnosed with COVID-19 in the last 2 weeks.   . Tell the health care staff about your symptoms: fever, cough and shortness of breath. . After you have been seen by a medical provider, you will be either: o Tested for (COVID-19) and discharged home on quarantine except to seek medical care if symptoms worsen, and asked to  - Stay home and avoid contact with others until you get your results (4-5 days)  - Avoid travel on public transportation if possible (such as bus, train, or airplane) or o Sent to the Emergency Department by EMS for evaluation, COVID-19 testing, and possible  admission depending on your condition and test results.  What to do if you are LOW RISK for COVID-19?  Reduce your risk of any infection by using the same precautions used for avoiding the common cold or flu:  . Wash your hands often with soap and warm water for at least 20 seconds.  If soap and water are not readily available, use an alcohol-based hand sanitizer with at least 60% alcohol.  . If coughing or sneezing, cover your mouth and nose by coughing or sneezing into the elbow areas of your shirt or coat, into a tissue or into your sleeve (not your hands). . Avoid shaking hands with others and consider head nods or verbal greetings only. . Avoid touching your eyes, nose, or mouth with unwashed hands.  . Avoid close contact with people who are sick. . Avoid places or events with large numbers of people in one location, like concerts or sporting events. . Carefully consider travel plans you have or are making. . If you are planning any travel outside or inside the [place], visit the CDC's Travelers' Health webpage for the latest health notices. . If you have some symptoms but not all symptoms, continue to monitor at home and seek medical attention if your symptoms worsen. . If you are having a medical emergency, call 911.   ADDITIONAL HEALTHCARE OPTIONS FOR PATIENTS  [facility] / e-Visit: [email or web address]         [facility]: [phone]  [facility]   Urgent Care: [facility]: [facility] Discharge Instructions for Patients Receiving Chemotherapy  Today you received the following chemotherapy agents :  Avastin,  Taxol,  Carboplatin.  To help prevent nausea and vomiting after your treatment, we encourage you to take your nausea medication as prescribed.   If you develop nausea and vomiting that is not controlled by your nausea medication, call  the clinic.   BELOW ARE SYMPTOMS THAT SHOULD BE REPORTED IMMEDIATELY:  *FEVER GREATER THAN 100.5 F  *CHILLS WITH OR WITHOUT FEVER  NAUSEA AND VOMITING THAT IS NOT CONTROLLED WITH YOUR NAUSEA MEDICATION  *UNUSUAL SHORTNESS OF BREATH  *UNUSUAL BRUISING OR BLEEDING  TENDERNESS IN MOUTH AND THROAT WITH OR WITHOUT PRESENCE OF ULCERS  *URINARY PROBLEMS  *BOWEL PROBLEMS  UNUSUAL RASH Items with * indicate a potential emergency and should be followed up as soon as possible.  Feel free to call the clinic should you have any questions or concerns. The clinic phone number is [phone].  Please show the [facility] at check-in to the Emergency Department and triage nurse.   Bevacizumab injection What is this medicine? BEVACIZUMAB (be va SIZ yoo mab) is a monoclonal antibody. It is used to treat many types of cancer. This medicine may be used for other purposes; ask your health care provider or pharmacist if you have questions. COMMON BRAND NAME(S): Avastin, MVASI, Zirabev What should I tell my health care provider before I take this medicine? They need to know if you have any of these conditions:  diabetes  heart disease  high blood pressure  history of coughing up blood  prior anthracycline chemotherapy (e.g., doxorubicin, daunorubicin, epirubicin)  recent or ongoing radiation therapy  recent or planning to have surgery  stroke  an unusual or allergic reaction to bevacizumab, hamster proteins, mouse proteins, other medicines, foods, dyes, or preservatives  pregnant or trying to get pregnant  breast-feeding How should I use this medicine? This medicine is for infusion into a vein. It is given by a health care professional in a hospital or clinic setting. Talk to your pediatrician regarding the use of this medicine in children. Special care may be needed. Overdosage: If you think you have taken too much of this medicine contact a poison control center or  emergency room at once. NOTE: This medicine is only for you. Do not share this medicine with others. What if I miss a dose? It is important not to miss your dose. Call your doctor or health care professional if you are unable to keep an appointment. What may interact with this medicine? Interactions are not expected. This list may not describe all possible interactions. Give your health care provider a list of all the medicines, herbs, non-prescription drugs, or dietary supplements you use. Also tell them if you smoke, drink alcohol, or use illegal drugs. Some items may interact with your medicine. What should I watch for while using this medicine? Your condition will be monitored carefully while you are receiving this medicine. You will need important blood work and urine testing done while you are taking this medicine. This medicine may increase your risk to bruise or bleed. Call your doctor or health care professional if you notice any unusual bleeding. This medicine should be started at least 28 days following major surgery and the site of the surgery should be  totally healed. Check with your doctor before scheduling dental work or surgery while you are receiving this treatment. Talk to your doctor if you have recently had surgery or if you have a wound that has not healed. Do not become pregnant while taking this medicine or for 6 months after stopping it. Women should inform their doctor if they wish to become pregnant or think they might be pregnant. There is a potential for serious side effects to an unborn child. Talk to your health care professional or pharmacist for more information. Do not breast-feed an infant while taking this medicine and for 6 months after the last dose. This medicine has caused ovarian failure in some women. This medicine may interfere with the ability to have a child. You should talk to your doctor or health care professional if you are concerned about your  fertility. What side effects may I notice from receiving this medicine? Side effects that you should report to your doctor or health care professional as soon as possible:  allergic reactions like skin rash, itching or hives, swelling of the face, lips, or tongue  chest pain or chest tightness  chills  coughing up blood  high fever  seizures  severe constipation  signs and symptoms of bleeding such as bloody or black, tarry stools; red or dark-brown urine; spitting up blood or brown material that looks like coffee grounds; red spots on the skin; unusual bruising or bleeding from the eye, gums, or nose  signs and symptoms of a blood clot such as breathing problems; chest pain; severe, sudden headache; pain, swelling, warmth in the leg  signs and symptoms of a stroke like changes in vision; confusion; trouble speaking or understanding; severe headaches; sudden numbness or weakness of the face, arm or leg; trouble walking; dizziness; loss of balance or coordination  stomach pain  sweating  swelling of legs or ankles  vomiting  weight gain Side effects that usually do not require medical attention (report to your doctor or health care professional if they continue or are bothersome):  back pain  changes in taste  decreased appetite  dry skin  nausea  tiredness This list may not describe all possible side effects. Call your doctor for medical advice about side effects. You may report side effects to FDA at 1-800-FDA-1088. Where should I keep my medicine? This drug is given in a hospital or clinic and will not be stored at home. NOTE: This sheet is a summary. It may not cover all possible information. If you have questions about this medicine, talk to your doctor, pharmacist, or health care provider.  2020 Elsevier/Gold Standard (2016-06-20 14:33:29)

## 2019-01-22 LAB — CA 125: Cancer Antigen (CA) 125: 408 U/mL — ABNORMAL HIGH (ref 0.0–38.1)

## 2019-01-24 ENCOUNTER — Telehealth: Payer: Self-pay | Admitting: *Deleted

## 2019-01-24 NOTE — Telephone Encounter (Signed)
Patient advised lab results. She reports she understands and anticipates her continued treatment. She reports her BP has remained stable. This morning she was 121/76. She understands to call this office if she has any concerns or questions.

## 2019-01-24 NOTE — Telephone Encounter (Signed)
-----   Message from Heath Lark, MD sent at 01/24/2019  8:28 AM EDT ----- Regarding: CA-125 Pls let her know results of CA-125 Even though results look worse, her previous test was done almost 3 weeks before her chemo starts We will continue treatment as scheduled

## 2019-02-11 ENCOUNTER — Inpatient Hospital Stay: Payer: Medicare Other

## 2019-02-11 ENCOUNTER — Other Ambulatory Visit: Payer: Self-pay

## 2019-02-11 ENCOUNTER — Inpatient Hospital Stay: Payer: Medicare Other | Attending: Gynecology

## 2019-02-11 ENCOUNTER — Inpatient Hospital Stay (HOSPITAL_BASED_OUTPATIENT_CLINIC_OR_DEPARTMENT_OTHER): Payer: Medicare Other | Admitting: Hematology and Oncology

## 2019-02-11 ENCOUNTER — Encounter: Payer: Self-pay | Admitting: Hematology and Oncology

## 2019-02-11 VITALS — BP 143/68

## 2019-02-11 DIAGNOSIS — T451X5A Adverse effect of antineoplastic and immunosuppressive drugs, initial encounter: Secondary | ICD-10-CM | POA: Diagnosis not present

## 2019-02-11 DIAGNOSIS — C561 Malignant neoplasm of right ovary: Secondary | ICD-10-CM

## 2019-02-11 DIAGNOSIS — Z5112 Encounter for antineoplastic immunotherapy: Secondary | ICD-10-CM | POA: Insufficient documentation

## 2019-02-11 DIAGNOSIS — I10 Essential (primary) hypertension: Secondary | ICD-10-CM | POA: Diagnosis not present

## 2019-02-11 DIAGNOSIS — Z7189 Other specified counseling: Secondary | ICD-10-CM

## 2019-02-11 DIAGNOSIS — Z5111 Encounter for antineoplastic chemotherapy: Secondary | ICD-10-CM | POA: Diagnosis present

## 2019-02-11 DIAGNOSIS — D6481 Anemia due to antineoplastic chemotherapy: Secondary | ICD-10-CM

## 2019-02-11 DIAGNOSIS — E119 Type 2 diabetes mellitus without complications: Secondary | ICD-10-CM

## 2019-02-11 LAB — CMP (CANCER CENTER ONLY)
ALT: 40 U/L (ref 0–44)
AST: 27 U/L (ref 15–41)
Albumin: 4 g/dL (ref 3.5–5.0)
Alkaline Phosphatase: 83 U/L (ref 38–126)
Anion gap: 16 — ABNORMAL HIGH (ref 5–15)
BUN: 23 mg/dL (ref 8–23)
CO2: 20 mmol/L — ABNORMAL LOW (ref 22–32)
Calcium: 9.4 mg/dL (ref 8.9–10.3)
Chloride: 104 mmol/L (ref 98–111)
Creatinine: 0.87 mg/dL (ref 0.44–1.00)
GFR, Est AFR Am: >60 mL/min (ref >60)
GFR, Estimated: >60 mL/min (ref >60)
Glucose, Bld: 229 mg/dL — ABNORMAL HIGH (ref 70–99)
Potassium: 4.1 mmol/L (ref 3.5–5.1)
Sodium: 140 mmol/L (ref 135–145)
Total Bilirubin: 0.3 mg/dL (ref 0.3–1.2)
Total Protein: 7.9 g/dL (ref 6.5–8.1)

## 2019-02-11 LAB — CBC WITH DIFFERENTIAL (CANCER CENTER ONLY)
Abs Immature Granulocytes: 0.04 10*3/uL (ref 0.00–0.07)
Basophils Absolute: 0 10*3/uL (ref 0.0–0.1)
Basophils Relative: 0 %
Eosinophils Absolute: 0 10*3/uL (ref 0.0–0.5)
Eosinophils Relative: 0 %
HCT: 32.8 % — ABNORMAL LOW (ref 36.0–46.0)
Hemoglobin: 10.2 g/dL — ABNORMAL LOW (ref 12.0–15.0)
Immature Granulocytes: 1 %
Lymphocytes Relative: 15 %
Lymphs Abs: 0.8 10*3/uL (ref 0.7–4.0)
MCH: 29.1 pg (ref 26.0–34.0)
MCHC: 31.1 g/dL (ref 30.0–36.0)
MCV: 93.4 fL (ref 80.0–100.0)
Monocytes Absolute: 0.2 10*3/uL (ref 0.1–1.0)
Monocytes Relative: 4 %
Neutro Abs: 4 10*3/uL (ref 1.7–7.7)
Neutrophils Relative %: 80 %
Platelet Count: 278 10*3/uL (ref 150–400)
RBC: 3.51 MIL/uL — ABNORMAL LOW (ref 3.87–5.11)
RDW: 16.7 % — ABNORMAL HIGH (ref 11.5–15.5)
WBC Count: 5 10*3/uL (ref 4.0–10.5)
nRBC: 0 % (ref 0.0–0.2)

## 2019-02-11 LAB — TOTAL PROTEIN, URINE DIPSTICK

## 2019-02-11 MED ORDER — SODIUM CHLORIDE 0.9% FLUSH
10.0000 mL | Freq: Once | INTRAVENOUS | Status: AC
  Administered 2019-02-11: 10 mL
  Filled 2019-02-11: qty 10

## 2019-02-11 MED ORDER — SODIUM CHLORIDE 0.9 % IV SOLN
14.7000 mg/kg | Freq: Once | INTRAVENOUS | Status: AC
  Administered 2019-02-11: 1600 mg via INTRAVENOUS
  Filled 2019-02-11: qty 64

## 2019-02-11 MED ORDER — HEPARIN SOD (PORK) LOCK FLUSH 100 UNIT/ML IV SOLN
500.0000 [IU] | Freq: Once | INTRAVENOUS | Status: AC | PRN
  Administered 2019-02-11: 16:00:00 500 [IU]
  Filled 2019-02-11: qty 5

## 2019-02-11 MED ORDER — SODIUM CHLORIDE 0.9 % IV SOLN
140.0000 mg/m2 | Freq: Once | INTRAVENOUS | Status: AC
  Administered 2019-02-11: 318 mg via INTRAVENOUS
  Filled 2019-02-11: qty 53

## 2019-02-11 MED ORDER — SODIUM CHLORIDE 0.9 % IV SOLN
603.0000 mg | Freq: Once | INTRAVENOUS | Status: AC
  Administered 2019-02-11: 600 mg via INTRAVENOUS
  Filled 2019-02-11: qty 60

## 2019-02-11 MED ORDER — FAMOTIDINE IN NACL 20-0.9 MG/50ML-% IV SOLN
20.0000 mg | Freq: Once | INTRAVENOUS | Status: AC
  Administered 2019-02-11: 10:00:00 20 mg via INTRAVENOUS

## 2019-02-11 MED ORDER — DIPHENHYDRAMINE HCL 50 MG/ML IJ SOLN
50.0000 mg | Freq: Once | INTRAMUSCULAR | Status: AC
  Administered 2019-02-11: 50 mg via INTRAVENOUS

## 2019-02-11 MED ORDER — SODIUM CHLORIDE 0.9 % IV SOLN
Freq: Once | INTRAVENOUS | Status: AC
  Administered 2019-02-11: 11:00:00 via INTRAVENOUS
  Filled 2019-02-11: qty 5

## 2019-02-11 MED ORDER — PALONOSETRON HCL INJECTION 0.25 MG/5ML
0.2500 mg | Freq: Once | INTRAVENOUS | Status: AC
  Administered 2019-02-11: 0.25 mg via INTRAVENOUS

## 2019-02-11 MED ORDER — FAMOTIDINE IN NACL 20-0.9 MG/50ML-% IV SOLN
INTRAVENOUS | Status: AC
  Filled 2019-02-11: qty 50

## 2019-02-11 MED ORDER — SODIUM CHLORIDE 0.9 % IV SOLN
Freq: Once | INTRAVENOUS | Status: AC
  Administered 2019-02-11: 10:00:00 via INTRAVENOUS
  Filled 2019-02-11: qty 250

## 2019-02-11 MED ORDER — SODIUM CHLORIDE 0.9% FLUSH
10.0000 mL | INTRAVENOUS | Status: DC | PRN
  Administered 2019-02-11: 10 mL
  Filled 2019-02-11: qty 10

## 2019-02-11 MED ORDER — PALONOSETRON HCL INJECTION 0.25 MG/5ML
INTRAVENOUS | Status: AC
  Filled 2019-02-11: qty 5

## 2019-02-11 MED ORDER — DIPHENHYDRAMINE HCL 50 MG/ML IJ SOLN
INTRAMUSCULAR | Status: AC
  Filled 2019-02-11: qty 1

## 2019-02-11 NOTE — Progress Notes (Signed)
Per DR [name] , it is okay to proceed with chemo and avastin and heart rate of 107.

## 2019-02-11 NOTE — Patient Instructions (Addendum)
[facility] Discharge Instructions for Patients Receiving Chemotherapy  Today you received the following chemotherapy agents: Paclitaxel (Taxol) and Carboplatin (Paraplatin)  To help prevent nausea and vomiting after your treatment, we encourage you to take your nausea medication as directed. Received Aloxi during your treatment today-->For the next 3 days take Compazine (not Zofran) for nausea if needed.    If you develop nausea and vomiting that is not controlled by your nausea medication, call the clinic.   BELOW ARE SYMPTOMS THAT SHOULD BE REPORTED IMMEDIATELY:  *FEVER GREATER THAN 100.5 F  *CHILLS WITH OR WITHOUT FEVER  NAUSEA AND VOMITING THAT IS NOT CONTROLLED WITH YOUR NAUSEA MEDICATION  *UNUSUAL SHORTNESS OF BREATH  *UNUSUAL BRUISING OR BLEEDING  TENDERNESS IN MOUTH AND THROAT WITH OR WITHOUT PRESENCE OF ULCERS  *URINARY PROBLEMS  *BOWEL PROBLEMS  UNUSUAL RASH Items with * indicate a potential emergency and should be followed up as soon as possible.  Feel free to call the clinic should you have any questions or concerns. The clinic phone number is [phone].  Please show the [facility] at check-in to the Emergency Department and triage nurse.  Coronavirus (COVID-19) Are you at risk?  Are you at risk for the Coronavirus (COVID-19)?  To be considered HIGH RISK for Coronavirus (COVID-19), you have to meet the following criteria:  . Traveled to [place], [place], [place], [place] or [place]; or in the [place] to [place], [place], [place], or [place]; and have fever, cough, and shortness of breath within the last 2 weeks of travel OR . Been in close contact with a person diagnosed with COVID-19 within the last 2 weeks and have fever, cough, and shortness of breath . IF YOU DO NOT MEET THESE CRITERIA, YOU ARE CONSIDERED LOW RISK FOR COVID-19.  What to do if you are HIGH RISK for COVID-19?  Marland Kitchen If you are having a medical emergency,  call 911. . Seek medical care right away. Before you go to a doctor's office, urgent care or emergency department, call ahead and tell them about your recent travel, contact with someone diagnosed with COVID-19, and your symptoms. You should receive instructions from your physician's office regarding next steps of care.  . When you arrive at healthcare provider, tell the healthcare staff immediately you have returned from visiting [place], [place], [place], [place] or [place]; or traveled in the [place] to [place], [place], [place], or [place]; in the last two weeks or you have been in close contact with a person diagnosed with COVID-19 in the last 2 weeks.   . Tell the health care staff about your symptoms: fever, cough and shortness of breath. . After you have been seen by a medical provider, you will be either: o Tested for (COVID-19) and discharged home on quarantine except to seek medical care if symptoms worsen, and asked to  - Stay home and avoid contact with others until you get your results (4-5 days)  - Avoid travel on public transportation if possible (such as bus, train, or airplane) or o Sent to the Emergency Department by EMS for evaluation, COVID-19 testing, and possible admission depending on your condition and test results.  What to do if you are LOW RISK for COVID-19?  Reduce your risk of any infection by using the same precautions used for avoiding the common cold or flu:  Marland Kitchen Wash your hands often with soap and warm water for at least 20 seconds.  If  soap and water are not readily available, use an alcohol-based hand sanitizer with at least 60% alcohol.  . If coughing or sneezing, cover your mouth and nose by coughing or sneezing into the elbow areas of your shirt or coat, into a tissue or into your sleeve (not your hands). . Avoid shaking hands with others and consider head nods or verbal greetings only. . Avoid touching your eyes, nose, or mouth with unwashed hands.   . Avoid close contact with people who are sick. . Avoid places or events with large numbers of people in one location, like concerts or sporting events. . Carefully consider travel plans you have or are making. . If you are planning any travel outside or inside the [place], visit the CDC's Travelers' Health webpage for the latest health notices. . If you have some symptoms but not all symptoms, continue to monitor at home and seek medical attention if your symptoms worsen. . If you are having a medical emergency, call 911.   [facility] / e-Visit: [email or web address]         [facility]: [facility]: [phone]                   [facility]: [phone]    Protein Content in Foods  Generally, most healthy people need around 50 grams of protein each day. Depending on your overall health, you may need more or less protein in your diet. Talk to your health care provider or dietitian about how much protein you need. See the following list for the protein content of some common foods. High-protein foods High-protein foods contain 4 grams (4 g) or more of protein per serving. They include:  Beef, ground sirloin (cooked) - 3 oz have 24 g of protein.  Cheese (hard) - 1 oz has 7 g of protein.  Chicken breast, boneless and skinless (cooked) - 3 oz have 13.4 g of protein.  Cottage cheese - 1/2 cup has 13.4 g of protein.  Egg - 1 egg has 6 g of protein.  Fish, filet (cooked) - 1 oz has 6-7 g of protein.  Garbanzo beans (canned or cooked) - 1/2 cup has 6-7 g of protein.  Kidney beans (canned or cooked) - 1/2 cup has 6-7 g of protein.  Lamb (cooked) - 3 oz has 24 g of protein.  Milk - 1 cup (8 oz) has 8 g of protein.  Nuts (peanuts, pistachios, almonds) - 1 oz has 6 g of protein.  Peanut butter - 1 oz has 7-8 g of protein.  Pork  tenderloin (cooked) - 3 oz has 18.4 g of protein.  Pumpkin seeds - 1 oz has 8.5 g of protein.  Soybeans (roasted) - 1 oz has 8 g of protein.  Soybeans (cooked) - 1/2 cup has 11 g of protein.  Soy milk - 1 cup (8 oz) has 5-10 g of protein.  Soy or vegetable patty - 1 patty has 11 g of protein.  Sunflower seeds - 1 oz has 5.5 g of protein.  Tofu (firm) - 1/2 cup has 20 g of protein.  Tuna (canned in water) - 3 oz has 20 g of protein.  Yogurt - 6 oz has 8 g of protein. Low-protein foods Low-protein foods contain 3 grams (3 g) or less of protein per serving. They include:  Beets (raw or cooked) - 1/2 cup has 1.5 g of protein.  Bran cereal - 1/2 cup has 2-3 g of protein.  Bread - 1 slice has 2.5 g of protein.  Broccoli (raw or cooked) - 1/2 cup has 2 g of protein.  Collard greens (raw or cooked) - 1/2 cup has 2 g of protein.  Corn (fresh or cooked) - 1/2 cup has 2 g of protein.  Cream cheese - 1 oz has 2 g of protein.  Creamer (half-and-half) - 1 oz has 1 g of protein.  Flour tortilla - 1 tortilla has 2.5 g of protein  Frozen yogurt - 1/2 cup has 3 g of protein.  Fruit or vegetable juice - 1/2 cup has 1 g of protein.  Green beans (raw or cooked) - 1/2 cup has 1 g of protein.  Green peas (canned) - 1/2 cup has 3.5 g of protein.  Muffins - 1 small muffin (2 oz) has 3 g of protein.  Oatmeal (cooked) - 1/2 cup has 3 g of protein.  Potato (baked with skin) - 1 medium potato has 3 g of protein.  Rice (cooked) - 1/2 cup has 2.5-3.5 g of protein.  Sour cream - 1/2 cup has 2.5 g of protein.  Spinach (cooked) - 1/2 cup has 3 g of protein.  Squash (cooked) - 1/2 cup has 1.5 g of protein. Actual amounts of protein may be different depending on processing. Talk with your health care provider or dietitian about what foods are recommended for you. This information is not intended to replace advice given to you by your health care provider. Make sure you discuss any questions  you have with your health care provider. Document Released: 09/22/2015 Document Revised: 03/03/2016 Document Reviewed: 03/03/2016 Elsevier Patient Education  2020 [employer].

## 2019-02-11 NOTE — Patient Instructions (Signed)

## 2019-02-11 NOTE — Progress Notes (Signed)
[place] OFFICE PROGRESS NOTE  Patient Care Team: Lilian Coma., MD as PCP - General (Internal Medicine)  ASSESSMENT & PLAN:  Ovarian cancer on right [facility]) So far, she tolerated treatment very well with minimum side effects She has lost some weight but I suspect weight loss is due to resolution of ascites We will proceed with cycle 3 without dose adjustment I plan to order CT imaging around August 26, see her on the 27th and she will receive her next dose of chemotherapy on August 28 She agreed with the plan of care  Anemia due to antineoplastic chemotherapy This is likely due to recent treatment. The patient denies recent history of bleeding such as epistaxis, hematuria or hematochezia. She is asymptomatic from the anemia. I will observe for now.  She does not require transfusion now. I will continue the chemotherapy at current dose without dosage adjustment.  If the anemia gets progressive worse in the future, I might have to delay her treatment or adjust the chemotherapy dose.  Type 2 diabetes mellitus without complication ([place]) She is at high risk of severe hyperglycemia during treatment I recommend close follow-up with primary care doctor and advised her to check her blood sugar regularly the first few days after treatment   Orders Placed This Encounter  Procedures  . CT ABDOMEN PELVIS W CONTRAST    Standing Status:   Future    Standing Expiration Date:   02/11/2020    Order Specific Question:   If indicated for the ordered procedure, I authorize the administration of contrast media per Radiology protocol    Answer:   Yes    Order Specific Question:   Preferred imaging location?    Answer:   [facility]    Order Specific Question:   Radiology Contrast Protocol - do NOT remove file path    Answer:   [email or web address]    INTERVAL HISTORY: Please see below for problem oriented charting. 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    03/20/2017 Imaging   GYN ultrasound was performed this revealed a small fundus measuring 5 cm there are 2 small fibroids within the fundus with the largest measuring 1.3 cm Within the endometrium there is a small amount a fluid the endometrial wall was slightly thickened at 4.8 mm the ultrasound was consistent with a small endometrial polyp  The right adnexa measured 7.4 x 6.7 cm contained a both a cystic and solid mass with a small amount of shadowing This is consistent with a dermoid tumor the left ovary was 2.6 x 1.5 x 1.0 cm There is no other adnexal masses no free fluid within the abdomen no ascites     04/10/2017 Surgery   Operation: Operative Laparscopy with  Peritoneal biopsy 2 hysteroscopy D&C Surgeon: Lynder Parents MD  Specimens: Peritoneal biopsy and cellular washings  Complications: None FIndings Small amount of  ascites was found cellular washings were sent There was peritoneal studding And omental caking Even in steep Trendelenburg the ovaries and uterus were unable to be visualized due to the small intestines in the lower pelvis     04/10/2017 Pathology Results   Biopsy from [facility] was nondiagnostic.  Peritoneum biopsy did not reveal malignancy.  Endometrial biopsy also did not reveal malignancy.   04/13/2017 Tumor Marker    Patient's tumor was tested for the following markers: CA-125 Results of the tumor marker test revealed 324.1   04/20/2017 Tumor Marker   Patient's tumor was tested for the following markers: CA-125 Results of the tumor marker test revealed 345.6   04/22/2017 Pathology Results   Peritoneum, biopsy - CARCINOMA. - SEE COMMENT.   04/22/2017 Procedure   CT-guided core biopsy performed of peritoneal tumor in the left lower quadrant.   04/24/2017 - 09/25/2017 Chemotherapy   She received carboplatin and Taxol x 3 cycles followed by interval surgical debulking on 12/31. Chemotherapy was subsequently resumed for 3 more cycles   06/05/2017 Tumor Marker   Patient's tumor was tested for the following markers: CA-125 Results of the tumor marker test revealed 27.5   06/17/2017 Imaging   1. Considerable improvement in the prior omental caking, which now presents mainly as an indistinct stranding with slight nodularity along the left upper quadrant omentum. 2. Stable appearance of right ovarian mass with fatty and calcific elements classic for a dermoid. 3. There is accentuated enhancement in the walls of the common hepatic duct and common bile duct. A low-grade cholangitis is not readily excluded. 4. Other imaging findings of potential clinical significance: Mild cardiomegaly. Aortic Atherosclerosis (ICD10-I70.0). Lumbar scoliosis, spondylosis, degenerative disc disease, and pars defects at L5. These cause impingement at L5-S1 and lesser impingement at L3-4 and L4-5. 5. 65% compression fracture at L1 slightly worsened than on the prior exam, with associated vertebral sclerosis and posterior retropulsion.   07/06/2017 Surgery   Bilateral Salpingo-Oophorectomy W/Omentectomy, Total Abd Hysterectomy & Radical Dissection For Debulking Right - Ureterolysis, With Or Without Repositioning Of Ureter For Retroperitoneal Fibrosis   07/06/2017 Pathology Results   A: Omentum, omentectomy - Omentum with minimal  residual carcinoma, consistent with high grade serous carcinoma (microscopic; stage ypT3a) - Extensive treatment effect with calcifications, fibrosis, histiocytes, and giant cell response - CRS score 3  B: Uterus with cervix and left ovary and fallopian tube, hysterectomy and left salpingo-oophorectomy - Cervix: Ectocervix and endocervix with no dysplasia or malignancy identified - Endometrium: Inactive with no hyperplasia, atypia, or malignancy identified - Myometrium: Adenomyosis and benign leiomyoma, size 1.2 cm - Serosa: Focal calcification and histiocytes suggestive of treatment effect, with no viable carcinoma identified - Ovary, left: Physiologic changes and no carcinoma identified - Fallopian tube, left: 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 and no malignancy identified  C: Ovary and fallopian tube, right, salpingo-oophorectomy - Right fallopian tube with focal residual carcinoma, consistent with high grade serous carcinoma, and serous tubal intraepithelial carcinoma (STIC) - Associated calcifications and histiocytes suggestive of treatment response - Right ovary with calcifications and histiocytes suggestive of treatment effect, with no definite viable carcinoma identified - Mature cystic teratoma, 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 - See synoptic report and comment   08/14/2017 Tumor Marker   Patient's tumor was tested for the following markers: CA-125 Results of the tumor marker test revealed 14.6   09/04/2017 Tumor Marker   Patient's tumor was tested for the following markers: CA-125 Results of the tumor marker test  revealed 12.5   10/03/2017 Genetic Testing   The Common Hereditary Cancer Panel offered by [facility] includes sequencing and/or deletion duplication testing of the following 47 genes: APC, ATM, AXIN2, BARD1, BMPR1A, BRCA1, BRCA2, BRIP1, CDH1, CDKN2A (p14ARF), CDKN2A (p16INK4a), CKD4, CHEK2, CTNNA1, DICER1, EPCAM (Deletion/duplication testing only), GREM1 (promoter region  deletion/duplication testing only), KIT, MEN1, MLH1, MSH2, MSH3, MSH6, MUTYH, NBN, NF1, NHTL1, PALB2, PDGFRA, PMS2, POLD1, POLE, PTEN, RAD50, RAD51C, RAD51D, SDHB, SDHC, SDHD, SMAD4, SMARCA4. STK11, TP53, TSC1, TSC2, and VHL.  The following genes were evaluated for sequence changes only: SDHA and HOXB13 c.251G>A variant only.  Results: Negative, no pathogenic variants identified.  The date of this test report is 10/03/2017.    10/28/2017 Imaging   1. Stable pulmonary nodules. No new or progressive findings. 2. No residual or recurrent omental disease is identified. No obvious peritoneal surface disease. 3. Increasing soft tissue density and ill-defined interstitial changes around the abdominal wall surgical incision. This may be progressive postoperative changes or granulation tissue but attention on future scans to exclude the possibility of tumor. 4. Status post resection of right adnexal mass. No pelvic mass or pelvic adenopathy. 5. Stable T12 compression fracture.   01/25/2018 Tumor Marker   Patient's tumor was tested for the following markers: CA-125 Results of the tumor marker test revealed 9.2   12/10/2018 Tumor Marker   Patient's tumor was tested for the following markers: CA-125 Results of the tumor marker test revealed 212   12/22/2018 Imaging   1. Interval development of multiple soft tissue lesions in the gastrocolic ligament, mesentery, and along the peritoneal surface. These soft tissue lesions measure up to 6.5 cm in size. Associated interval development of moderate volume ascites in the abdomen and pelvis. Imaging features are consistent with metastatic disease. 2. Soft tissue projects through the rectus sheath in the right paraumbilical region, likely herniated small bowel as there is a large amount of decompressed small bowel does deep to this finding. However, metastatic deposit at this location is also possibility.  3. Trace pneumobilia in the left liver suggests prior  sphincterotomy. 4.  Aortic Atherosclerois (ICD10-170.0)   12/30/2018 Procedure   Successful ultrasound-guided therapeutic paracentesis yielding 4.5 liters of peritoneal fluid.   12/30/2018 Procedure   Technically successful right IJ power-injectable port catheter placement. Ready for routine use.   12/31/2018 -  Chemotherapy   The patient had carboplatin and taxol for chemotherapy treatment.     01/21/2019 Tumor Marker   Patient's tumor was tested for the following markers: CA-125 Results of the tumor marker test revealed 408     REVIEW OF SYSTEMS:   Constitutional: Denies fevers, chills or abnormal weight loss Eyes: Denies blurriness of vision Ears, nose, mouth, throat, and face: Denies mucositis or sore throat Respiratory: Denies cough, dyspnea or wheezes Cardiovascular: Denies palpitation, chest discomfort or lower extremity swelling Gastrointestinal:  Denies nausea, heartburn or change in bowel habits Skin: Denies abnormal skin rashes Lymphatics: Denies new lymphadenopathy or easy bruising Neurological:Denies numbness, tingling or new weaknesses Behavioral/Psych: Mood is stable, no new changes  All other systems were reviewed with the patient and are negative.  I have reviewed the past medical history, past surgical history, social history and family history with the patient and they are unchanged from previous note.  ALLERGIES:  is allergic to aspirin; nsaids; and tramadol.  MEDICATIONS:  Current Outpatient Medications  Medication Sig Dispense Refill  . acetaminophen (TYLENOL) 650 MG CR tablet Take 1,300 mg by mouth every 8 (eight) hours.     Marland Kitchen  alendronate (FOSAMAX) 70 MG tablet Take 70 mg by mouth every Sunday.     Marland Kitchen atorvastatin (LIPITOR) 80 MG tablet Take 40 mg by mouth at bedtime.     . Cholecalciferol (VITAMIN D3) 2000 units TABS Take 2,000 Units by mouth daily.    . citalopram (CELEXA) 10 MG tablet Take 10 mg by mouth at bedtime    . dexamethasone (DECADRON) 4 MG  tablet Take 3 tabs at the night before and 3 tabs the morning of chemotherapy, every 3 weeks, by mouth 36 tablet 0  . ferrous sulfate 325 (65 FE) MG tablet Take 325 mg by mouth daily. 3 hours after meal and 30 min before a meal    . gabapentin (NEURONTIN) 300 MG capsule Take by mouth.    . Glucosamine Sulfate 500 MG TABS Take 500 mg by mouth 2 (two) times daily.     Marland Kitchen lidocaine-prilocaine (EMLA) cream Apply to affected area once 30 g 3  . loratadine (CLARITIN) 10 MG tablet Take 10 mg by mouth at bedtime.     Marland Kitchen losartan (COZAAR) 25 MG tablet Take 25 mg by mouth daily.     . meclizine (ANTIVERT) 25 MG tablet Take 25 mg by mouth 2 (two) times daily as needed for dizziness.     . metFORMIN (GLUCOPHAGE) 1000 MG tablet Take 1,000 mg by mouth 2 (two) times daily with a meal.     . omeprazole (PRILOSEC) 40 MG capsule Take 40 mg by mouth daily.     . ondansetron (ZOFRAN) 8 MG tablet Take 1 tablet (8 mg total) by mouth every 8 (eight) hours as needed for refractory nausea / vomiting. 30 tablet 1  . pioglitazone (ACTOS) 15 MG tablet Take 15 mg by mouth at bedtime.     . prochlorperazine (COMPAZINE) 10 MG tablet Take 1 tablet (10 mg total) by mouth every 6 (six) hours as needed (Nausea or vomiting). 30 tablet 1  . Pyridoxine HCl (VITAMIN B-6 PO) Take 50 mg by mouth 1 day or 1 dose.    . vitamin B-12 (CYANOCOBALAMIN) 1000 MCG tablet Take 1,000 mcg by mouth daily.      No current facility-administered medications for this visit.    Facility-Administered Medications Ordered in Other Visits  Medication Dose Route Frequency Provider Last Rate Last Dose  . CARBOplatin (PARAPLATIN) 600 mg in sodium chloride 0.9 % 250 mL chemo infusion  600 mg Intravenous Once [name], Gerrit Rafalski, MD      . heparin lock flush 100 unit/mL  500 Units Intracatheter Once PRN [name], Rudra Hobbins, MD      . PACLitaxel (TAXOL) 318 mg in sodium chloride 0.9 % 500 mL chemo infusion (> 45m/m2)  140 mg/m2 (Treatment Plan Recorded) Intravenous Once GHeath Lark MD 184 mL/hr at 02/11/19 1206 318 mg at 02/11/19 1206  . sodium chloride flush (NS) 0.9 % injection 10 mL  10 mL Intracatheter Once GAlvy Bimler Wilbon Obenchain, MD      . sodium chloride flush (NS) 0.9 % injection 10 mL  10 mL Intracatheter PRN GAlvy Bimler Taz Vanness, MD        PHYSICAL EXAMINATION: ECOG PERFORMANCE STATUS: 0 - Asymptomatic  Vitals:   02/11/19 0905  BP: (!) 144/66  Pulse: (!) 107  Resp: 18  Temp: 98.3 F (36.8 C)   Filed Weights   02/11/19 0905  Weight: 230 lb 3.2 oz (104.4 kg)    GENERAL:alert, no distress and comfortable SKIN: skin color, texture, turgor are normal, no rashes or significant lesions EYES: normal,  Conjunctiva are pink and non-injected, sclera clear OROPHARYNX:no exudate, no erythema and lips, buccal mucosa, and tongue normal  NECK: supple, thyroid normal size, non-tender, without nodularity LYMPH:  no palpable lymphadenopathy in the cervical, axillary or inguinal LUNGS: clear to auscultation and percussion with normal breathing effort HEART: regular rate & rhythm and no murmurs and no lower extremity edema ABDOMEN:abdomen soft, non-tender and normal bowel sounds Musculoskeletal:no cyanosis of digits and no clubbing  NEURO: alert & oriented x 3 with fluent speech, no focal motor/sensory deficits  LABORATORY DATA:  I have reviewed the data as listed    Component Value Date/Time   NA 140 02/11/2019 0852   NA 139 06/05/2017 0746   K 4.1 02/11/2019 0852   K 4.1 06/05/2017 0746   CL 104 02/11/2019 0852   CO2 20 (L) 02/11/2019 0852   CO2 21 (L) 06/05/2017 0746   GLUCOSE 229 (H) 02/11/2019 0852   GLUCOSE 279 (H) 06/05/2017 0746   BUN 23 02/11/2019 0852   BUN 15.0 06/05/2017 0746   CREATININE 0.87 02/11/2019 0852   CREATININE 0.9 06/05/2017 0746   CALCIUM 9.4 02/11/2019 0852   CALCIUM 10.1 06/05/2017 0746   PROT 7.9 02/11/2019 0852   PROT 8.1 06/05/2017 0746   ALBUMIN 4.0 02/11/2019 0852   ALBUMIN 4.0 06/05/2017 0746   AST 27 02/11/2019 0852   AST 22  06/05/2017 0746   ALT 40 02/11/2019 0852   ALT 36 06/05/2017 0746   ALKPHOS 83 02/11/2019 0852   ALKPHOS 98 06/05/2017 0746   BILITOT 0.3 02/11/2019 0852   BILITOT 0.44 06/05/2017 0746   GFRNONAA >60 02/11/2019 0852   GFRAA >60 02/11/2019 0852    No results found for: SPEP, UPEP  Lab Results  Component Value Date   WBC 5.0 02/11/2019   NEUTROABS 4.0 02/11/2019   HGB 10.2 (L) 02/11/2019   HCT 32.8 (L) 02/11/2019   MCV 93.4 02/11/2019   PLT 278 02/11/2019      Chemistry      Component Value Date/Time   NA 140 02/11/2019 0852   NA 139 06/05/2017 0746   K 4.1 02/11/2019 0852   K 4.1 06/05/2017 0746   CL 104 02/11/2019 0852   CO2 20 (L) 02/11/2019 0852   CO2 21 (L) 06/05/2017 0746   BUN 23 02/11/2019 0852   BUN 15.0 06/05/2017 0746   CREATININE 0.87 02/11/2019 0852   CREATININE 0.9 06/05/2017 0746      Component Value Date/Time   CALCIUM 9.4 02/11/2019 0852   CALCIUM 10.1 06/05/2017 0746   ALKPHOS 83 02/11/2019 0852   ALKPHOS 98 06/05/2017 0746   AST 27 02/11/2019 0852   AST 22 06/05/2017 0746   ALT 40 02/11/2019 0852   ALT 36 06/05/2017 0746   BILITOT 0.3 02/11/2019 0852   BILITOT 0.44 06/05/2017 0746       All questions were answered. The patient knows to call the clinic with any problems, questions or concerns. No barriers to learning was detected.  I spent 15 minutes counseling the patient face to face. The total time spent in the appointment was 20 minutes and more than 50% was on counseling and review of test results  Heath Lark, MD 02/11/2019 2:03 PM

## 2019-02-11 NOTE — Assessment & Plan Note (Addendum)

## 2019-02-11 NOTE — Assessment & Plan Note (Signed)
So far, she tolerated treatment very well with minimum side effects She has lost some weight but I suspect weight loss is due to resolution of ascites We will proceed with cycle 3 without dose adjustment I plan to order CT imaging around August 26, see her on the 27th and she will receive her next dose of chemotherapy on August 28 She agreed with the plan of care

## 2019-02-11 NOTE — Assessment & Plan Note (Signed)
She is at high risk of severe hyperglycemia during treatment I recommend close follow-up with primary care doctor and advised her to check her blood sugar regularly the first few days after treatment

## 2019-02-12 LAB — CA 125: Cancer Antigen (CA) 125: 646 U/mL — ABNORMAL HIGH (ref 0.0–38.1)

## 2019-02-21 ENCOUNTER — Other Ambulatory Visit: Payer: Self-pay | Admitting: Hematology and Oncology

## 2019-02-21 ENCOUNTER — Telehealth: Payer: Self-pay | Admitting: *Deleted

## 2019-02-21 DIAGNOSIS — C561 Malignant neoplasm of right ovary: Secondary | ICD-10-CM

## 2019-02-21 DIAGNOSIS — I10 Essential (primary) hypertension: Secondary | ICD-10-CM | POA: Insufficient documentation

## 2019-02-21 MED ORDER — HYDROCHLOROTHIAZIDE 25 MG PO TABS: 25.0000 mg | ORAL_TABLET | Freq: Every day | ORAL | 11 refills | Status: DC

## 2019-02-21 NOTE — Telephone Encounter (Signed)
I have sent HCTZ take morning Limit salt intake

## 2019-02-21 NOTE — Telephone Encounter (Signed)
Patient advised to pick up medication. She understands to continue monitoring BP daily. Discussed watching for signs of hypotension, feeling lightheaded. Patient understands to switch positions slowly and observe for signs of dizziness. Patient verbalized an understanding to call this office with any concerns or questions. She confirms her appt next week.

## 2019-02-21 NOTE — Telephone Encounter (Signed)
Patient called concerned her BP today and over the weekend has been 144-146 over 82-88. She reports a weight increase of 4 lbs since 02/12/19. Patient denies any headaches, peripheral edema, chest pain or SOB. She states she actually feels great.    [place] if anything is prescribed.

## 2019-03-01 ENCOUNTER — Encounter: Payer: Self-pay | Admitting: Nutrition

## 2019-03-01 ENCOUNTER — Telehealth: Payer: Self-pay

## 2019-03-01 NOTE — Telephone Encounter (Signed)
She called and left a message to call her.  Called back, she would like to give bp readings and will bring with her on appt 8/27. 8/19 am 141/81 and pm 130/75 8/20 am 133/78 and pm 127/79 8/21 am 137/79 and pm 129/77 8/22 am 129/77 and pm 137/83 8/23 am 135/80 and pm 135/75 8/24 am 115/77 and pm 130/81 8/25 am am 137/76.

## 2019-03-01 NOTE — Telephone Encounter (Signed)
ok 

## 2019-03-01 NOTE — Progress Notes (Signed)
Patient called to cancel nutrition appointment on Friday, August 28. She does not wish to reschedule. Appointment was cancelled.

## 2019-03-02 ENCOUNTER — Other Ambulatory Visit: Payer: Self-pay

## 2019-03-02 ENCOUNTER — Ambulatory Visit (HOSPITAL_COMMUNITY)
Admission: RE | Admit: 2019-03-02 | Discharge: 2019-03-02 | Disposition: A | Discharge status: Home / Self Care | Payer: Medicare Other | Source: Ambulatory Visit | Attending: Hematology and Oncology | Admitting: Hematology and Oncology

## 2019-03-02 ENCOUNTER — Encounter (HOSPITAL_COMMUNITY): Payer: Self-pay

## 2019-03-02 DIAGNOSIS — C561 Malignant neoplasm of right ovary: Secondary | ICD-10-CM | POA: Insufficient documentation

## 2019-03-02 MED ORDER — HEPARIN SOD (PORK) LOCK FLUSH 100 UNIT/ML IV SOLN
500.0000 [IU] | Freq: Once | INTRAVENOUS | Status: AC
  Administered 2019-03-02: 500 [IU] via INTRAVENOUS

## 2019-03-02 MED ORDER — HEPARIN SOD (PORK) LOCK FLUSH 100 UNIT/ML IV SOLN
INTRAVENOUS | Status: AC
  Filled 2019-03-02: qty 5

## 2019-03-02 MED ORDER — IOHEXOL 300 MG/ML  SOLN
100.0000 mL | Freq: Once | INTRAMUSCULAR | Status: AC | PRN
  Administered 2019-03-02: 11:00:00 100 mL via INTRAVENOUS

## 2019-03-02 MED ORDER — SODIUM CHLORIDE (PF) 0.9 % IJ SOLN
INTRAMUSCULAR | Status: AC
  Filled 2019-03-02: qty 50

## 2019-03-03 ENCOUNTER — Other Ambulatory Visit: Payer: Self-pay

## 2019-03-03 ENCOUNTER — Encounter: Payer: Self-pay | Admitting: Hematology and Oncology

## 2019-03-03 ENCOUNTER — Inpatient Hospital Stay (HOSPITAL_BASED_OUTPATIENT_CLINIC_OR_DEPARTMENT_OTHER): Payer: Medicare Other | Admitting: Hematology and Oncology

## 2019-03-03 DIAGNOSIS — I10 Essential (primary) hypertension: Secondary | ICD-10-CM | POA: Diagnosis not present

## 2019-03-03 DIAGNOSIS — T451X5A Adverse effect of antineoplastic and immunosuppressive drugs, initial encounter: Secondary | ICD-10-CM

## 2019-03-03 DIAGNOSIS — C561 Malignant neoplasm of right ovary: Secondary | ICD-10-CM | POA: Diagnosis not present

## 2019-03-03 DIAGNOSIS — D6481 Anemia due to antineoplastic chemotherapy: Secondary | ICD-10-CM | POA: Diagnosis not present

## 2019-03-03 NOTE — Progress Notes (Signed)
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 [facility]) I have reviewed multiple imaging studies with the patient She has excellent response to treatment with minimum residual disease We will continue current treatment for 3 more cycles before repeating imaging study in November  Essential hypertension Her recent blood pressure control is excellent She will continue current blood pressure management and avoid extra salt intake if possible  Anemia due to antineoplastic chemotherapy She has some mild anemia likely due to chemotherapy I do not plan to recheck iron studies until we have stop her treatment   No orders of the defined types were placed in this encounter.   INTERVAL HISTORY: Please see below for problem oriented charting. 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    03/20/2017 Imaging   GYN ultrasound was performed this revealed a small fundus measuring 5 cm there are 2 small fibroids within the fundus with the largest measuring 1.3 cm Within the endometrium there is a small amount a fluid the endometrial wall was slightly thickened at 4.8 mm the ultrasound was consistent with a small endometrial polyp  The right adnexa measured 7.4 x 6.7 cm contained a both a cystic and solid mass with a small amount of shadowing This is consistent with a dermoid  tumor the left ovary was 2.6 x 1.5 x 1.0 cm There is no other adnexal masses no free fluid within the abdomen no ascites     04/10/2017 Surgery   Operation: Operative Laparscopy with  Peritoneal biopsy 2 hysteroscopy D&C Surgeon: Lynder Parents MD  Specimens: Peritoneal biopsy and cellular washings  Complications: None FIndings Small amount of ascites was found cellular washings were sent There was peritoneal studding And omental caking Even in steep Trendelenburg the ovaries and uterus were unable to be visualized due to the small intestines in the lower pelvis     04/10/2017 Pathology Results   Biopsy from [facility] was nondiagnostic.  Peritoneum biopsy did not reveal malignancy.  Endometrial biopsy also did not reveal malignancy.   04/13/2017 Tumor Marker   Patient's tumor was tested for the following markers: CA-125 Results of the tumor marker test revealed 324.1   04/20/2017 Tumor Marker   Patient's tumor was tested for the following markers: CA-125 Results of the tumor marker test revealed 345.6   04/22/2017 Pathology Results   Peritoneum, biopsy - CARCINOMA. - SEE COMMENT.   04/22/2017 Procedure   CT-guided core biopsy performed of peritoneal tumor in the left lower quadrant.   04/24/2017 - 09/25/2017 Chemotherapy   She received carboplatin and Taxol x 3 cycles followed by interval surgical debulking on 12/31. Chemotherapy was subsequently resumed for 3 more cycles   06/05/2017 Tumor Marker   Patient's tumor was tested for the following markers: CA-125 Results of the tumor marker test revealed 27.5   06/17/2017 Imaging   1. Considerable improvement in  the prior omental caking, which now presents mainly as an indistinct stranding with slight nodularity along the left upper quadrant omentum. 2. Stable appearance of right ovarian mass with fatty and calcific elements classic for a dermoid. 3. There is accentuated enhancement in the walls of  the common hepatic duct and common bile duct. A low-grade cholangitis is not readily excluded. 4. Other imaging findings of potential clinical significance: Mild cardiomegaly. Aortic Atherosclerosis (ICD10-I70.0). Lumbar scoliosis, spondylosis, degenerative disc disease, and pars defects at L5. These cause impingement at L5-S1 and lesser impingement at L3-4 and L4-5. 5. 65% compression fracture at L1 slightly worsened than on the prior exam, with associated vertebral sclerosis and posterior retropulsion.   07/06/2017 Surgery   Bilateral Salpingo-Oophorectomy W/Omentectomy, Total Abd Hysterectomy & Radical Dissection For Debulking Right - Ureterolysis, With Or Without Repositioning Of Ureter For Retroperitoneal Fibrosis   07/06/2017 Pathology Results   A: Omentum, omentectomy - Omentum with minimal residual carcinoma, consistent with high grade serous carcinoma (microscopic; stage ypT3a) - Extensive treatment effect with calcifications, fibrosis, histiocytes, and giant cell response - CRS score 3  B: Uterus with cervix and left ovary and fallopian tube, hysterectomy and left salpingo-oophorectomy - Cervix: Ectocervix and endocervix with no dysplasia or malignancy identified - Endometrium: Inactive with no hyperplasia, atypia, or malignancy identified - Myometrium: Adenomyosis and benign leiomyoma, size 1.2 cm - Serosa: Focal calcification and histiocytes suggestive of treatment effect, with no viable carcinoma identified - Ovary, left: Physiologic changes and no carcinoma identified - Fallopian tube, left: 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 and no malignancy identified  C: Ovary and fallopian tube, right, salpingo-oophorectomy - Right fallopian tube with focal residual carcinoma, consistent with high grade serous carcinoma, and serous tubal intraepithelial carcinoma (STIC) - Associated calcifications and histiocytes suggestive of treatment response - Right ovary with  calcifications and histiocytes suggestive of treatment effect, with no definite viable carcinoma identified - Mature cystic teratoma, 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 - See synoptic report and comment   08/14/2017 Tumor Marker   Patient's tumor was tested for the following markers: CA-125 Results of the tumor marker test revealed 14.6   09/04/2017 Tumor Marker   Patient's tumor was tested for the following markers: CA-125 Results of the tumor marker test revealed 12.5   10/03/2017 Genetic Testing   The Common Hereditary Cancer Panel offered by [facility] includes sequencing and/or deletion duplication testing of the following 47 genes: APC, ATM, AXIN2, BARD1, BMPR1A, BRCA1, BRCA2, BRIP1, CDH1, CDKN2A (p14ARF), CDKN2A (p16INK4a), CKD4, CHEK2, CTNNA1, DICER1, EPCAM (Deletion/duplication testing only), GREM1 (promoter region deletion/duplication testing only), KIT, MEN1, MLH1, MSH2, MSH3, MSH6, MUTYH, NBN, NF1, NHTL1, PALB2, PDGFRA, PMS2, POLD1, POLE, PTEN, RAD50, RAD51C, RAD51D, SDHB, SDHC, SDHD, SMAD4, SMARCA4. STK11, TP53, TSC1, TSC2, and VHL.  The following genes were evaluated for sequence changes only: SDHA and HOXB13 c.251G>A variant only.  Results: Negative, no pathogenic variants identified.  The date of this test report is 10/03/2017.    10/28/2017 Imaging   1. Stable pulmonary nodules. No new or progressive findings. 2. No residual or recurrent omental disease is identified. No obvious peritoneal surface disease. 3. Increasing soft tissue density and ill-defined interstitial changes around the abdominal wall surgical incision. This may be progressive postoperative changes or granulation tissue but attention on future scans to exclude the possibility of tumor. 4. Status post resection of right adnexal mass. No pelvic mass or pelvic adenopathy. 5. Stable T12 compression fracture.   01/25/2018 Tumor Marker   Patient's tumor was tested for the  following markers: CA-125 Results of the tumor marker test revealed 9.2    12/10/2018 Tumor Marker   Patient's tumor was tested for the following markers: CA-125 Results of the tumor marker test revealed 212   12/22/2018 Imaging   1. Interval development of multiple soft tissue lesions in the gastrocolic ligament, mesentery, and along the peritoneal surface. These soft tissue lesions measure up to 6.5 cm in size. Associated interval development of moderate volume ascites in the abdomen and pelvis. Imaging features are consistent with metastatic disease. 2. Soft tissue projects through the rectus sheath in the right paraumbilical region, likely herniated small bowel as there is a large amount of decompressed small bowel does deep to this finding. However, metastatic deposit at this location is also possibility.  3. Trace pneumobilia in the left liver suggests prior sphincterotomy. 4.  Aortic Atherosclerois (ICD10-170.0)   12/30/2018 Procedure   Successful ultrasound-guided therapeutic paracentesis yielding 4.5 liters of peritoneal fluid.   12/30/2018 Procedure   Technically successful right IJ power-injectable port catheter placement. Ready for routine use.   12/31/2018 -  Chemotherapy   The patient had carboplatin and taxol for chemotherapy treatment.     01/21/2019 Tumor Marker   Patient's tumor was tested for the following markers: CA-125 Results of the tumor marker test revealed 408   02/11/2019 Tumor Marker   Patient's tumor was tested for the following markers: CA-125 Results of the tumor marker test revealed 646.   03/02/2019 Imaging   1. Marked improvement in peritoneal carcinomatosis/ascites, without complete resolution. 2.  Aortic atherosclerosis (ICD10-170.0).       REVIEW OF SYSTEMS:   Constitutional: Denies fevers, chills or abnormal weight loss Eyes: Denies blurriness of vision Ears, nose, mouth, throat, and face: Denies mucositis or sore throat Respiratory: Denies cough, dyspnea or wheezes Cardiovascular: Denies palpitation, chest discomfort or  lower extremity swelling Gastrointestinal:  Denies nausea, heartburn or change in bowel habits Skin: Denies abnormal skin rashes Lymphatics: Denies new lymphadenopathy or easy bruising Neurological:Denies numbness, tingling or new weaknesses Behavioral/Psych: Mood is stable, no new changes  All other systems were reviewed with the patient and are negative.  I have reviewed the past medical history, past surgical history, social history and family history with the patient and they are unchanged from previous note.  ALLERGIES:  is allergic to aspirin; nsaids; and tramadol.  MEDICATIONS:  Current Outpatient Medications  Medication Sig Dispense Refill  . acetaminophen (TYLENOL) 650 MG CR tablet Take 1,300 mg by mouth every 8 (eight) hours.     Marland Kitchen alendronate (FOSAMAX) 70 MG tablet Take 70 mg by mouth every Sunday.     Marland Kitchen atorvastatin (LIPITOR) 80 MG tablet Take 40 mg by mouth at bedtime.     . Cholecalciferol (VITAMIN D3) 2000 units TABS Take 2,000 Units by mouth daily.    . citalopram (CELEXA) 10 MG tablet Take 10 mg by mouth at bedtime    . dexamethasone (DECADRON) 4 MG tablet Take 3 tabs at the night before and 3 tabs the morning of chemotherapy, every 3 weeks, by mouth 36 tablet 0  . ferrous sulfate 325 (65 FE) MG tablet Take 325 mg by mouth daily. 3 hours after meal and 30 min before a meal    . gabapentin (NEURONTIN) 300 MG capsule Take by mouth.    . Glucosamine Sulfate 500 MG TABS Take 500 mg by mouth 2 (two) times daily.     . hydrochlorothiazide (HYDRODIURIL) 25 MG tablet Take 1 tablet (25 mg total) by  mouth daily. 30 tablet 11  . lidocaine-prilocaine (EMLA) cream Apply to affected area once 30 g 3  . loratadine (CLARITIN) 10 MG tablet Take 10 mg by mouth at bedtime.     Marland Kitchen losartan (COZAAR) 25 MG tablet Take 25 mg by mouth daily.     . meclizine (ANTIVERT) 25 MG tablet Take 25 mg by mouth 2 (two) times daily as needed for dizziness.     . metFORMIN (GLUCOPHAGE) 1000 MG tablet Take  1,000 mg by mouth 2 (two) times daily with a meal.     . omeprazole (PRILOSEC) 40 MG capsule Take 40 mg by mouth daily.     . ondansetron (ZOFRAN) 8 MG tablet Take 1 tablet (8 mg total) by mouth every 8 (eight) hours as needed for refractory nausea / vomiting. 30 tablet 1  . pioglitazone (ACTOS) 15 MG tablet Take 15 mg by mouth at bedtime.     . prochlorperazine (COMPAZINE) 10 MG tablet Take 1 tablet (10 mg total) by mouth every 6 (six) hours as needed (Nausea or vomiting). 30 tablet 1  . Pyridoxine HCl (VITAMIN B-6 PO) Take 50 mg by mouth 1 day or 1 dose.    . vitamin B-12 (CYANOCOBALAMIN) 1000 MCG tablet Take 1,000 mcg by mouth daily.      No current facility-administered medications for this visit.    Facility-Administered Medications Ordered in Other Visits  Medication Dose Route Frequency Provider Last Rate Last Dose  . sodium chloride flush (NS) 0.9 % injection 10 mL  10 mL Intracatheter Once Heath Lark, MD        PHYSICAL EXAMINATION: ECOG PERFORMANCE STATUS: 1 - Symptomatic but completely ambulatory  Vitals:   03/03/19 1010  BP: (!) 141/74  Pulse: 92  Resp: 18  Temp: 98.7 F (37.1 C)  SpO2: 99%   Filed Weights   03/03/19 1010  Weight: 225 lb (102.1 kg)    GENERAL:alert, no distress and comfortable Musculoskeletal:no cyanosis of digits and no clubbing  NEURO: alert & oriented x 3 with fluent speech, no focal motor/sensory deficits  LABORATORY DATA:  I have reviewed the data as listed    Component Value Date/Time   NA 140 02/11/2019 0852   NA 139 06/05/2017 0746   K 4.1 02/11/2019 0852   K 4.1 06/05/2017 0746   CL 104 02/11/2019 0852   CO2 20 (L) 02/11/2019 0852   CO2 21 (L) 06/05/2017 0746   GLUCOSE 229 (H) 02/11/2019 0852   GLUCOSE 279 (H) 06/05/2017 0746   BUN 23 02/11/2019 0852   BUN 15.0 06/05/2017 0746   CREATININE 0.87 02/11/2019 0852   CREATININE 0.9 06/05/2017 0746   CALCIUM 9.4 02/11/2019 0852   CALCIUM 10.1 06/05/2017 0746   PROT 7.9 02/11/2019  0852   PROT 8.1 06/05/2017 0746   ALBUMIN 4.0 02/11/2019 0852   ALBUMIN 4.0 06/05/2017 0746   AST 27 02/11/2019 0852   AST 22 06/05/2017 0746   ALT 40 02/11/2019 0852   ALT 36 06/05/2017 0746   ALKPHOS 83 02/11/2019 0852   ALKPHOS 98 06/05/2017 0746   BILITOT 0.3 02/11/2019 0852   BILITOT 0.44 06/05/2017 0746   GFRNONAA >60 02/11/2019 0852   GFRAA >60 02/11/2019 0852    No results found for: SPEP, UPEP  Lab Results  Component Value Date   WBC 5.0 02/11/2019   NEUTROABS 4.0 02/11/2019   HGB 10.2 (L) 02/11/2019   HCT 32.8 (L) 02/11/2019   MCV 93.4 02/11/2019   PLT 278 02/11/2019  Chemistry      Component Value Date/Time   NA 140 02/11/2019 0852   NA 139 06/05/2017 0746   K 4.1 02/11/2019 0852   K 4.1 06/05/2017 0746   CL 104 02/11/2019 0852   CO2 20 (L) 02/11/2019 0852   CO2 21 (L) 06/05/2017 0746   BUN 23 02/11/2019 0852   BUN 15.0 06/05/2017 0746   CREATININE 0.87 02/11/2019 0852   CREATININE 0.9 06/05/2017 0746      Component Value Date/Time   CALCIUM 9.4 02/11/2019 0852   CALCIUM 10.1 06/05/2017 0746   ALKPHOS 83 02/11/2019 0852   ALKPHOS 98 06/05/2017 0746   AST 27 02/11/2019 0852   AST 22 06/05/2017 0746   ALT 40 02/11/2019 0852   ALT 36 06/05/2017 0746   BILITOT 0.3 02/11/2019 0852   BILITOT 0.44 06/05/2017 0746       RADIOGRAPHIC STUDIES: I have reviewed multiple imaging studies with the patient I have personally reviewed the radiological images as listed and agreed with the findings in the report. Ct Abdomen Pelvis W Contrast  Result Date: 03/02/2019 CLINICAL DATA:  Recurrent ovarian cancer, ongoing chemotherapy. EXAM: CT ABDOMEN AND PELVIS WITH CONTRAST TECHNIQUE: Multidetector CT imaging of the abdomen and pelvis was performed using the standard protocol following bolus administration of intravenous contrast. CONTRAST:  179m OMNIPAQUE IOHEXOL 300 MG/ML  SOLN COMPARISON:  12/22/2018 and CT chest 10/28/2017. FINDINGS: Lower chest: Scattered  subpleural nodules measure up to 6 mm in the right middle lobe (4/9), unchanged from 10/28/2017 and likely benign. Linear scarring in the lingula. Coronary artery calcification. Heart is at the upper limits of normal in size. No pericardial or pleural effusion. Hepatobiliary: Liver is unremarkable. Biliary ductal dilatation after cholecystectomy appears unchanged. The wall of the common bile duct appears thickened, similar. Pancreas: Negative. Spleen: Negative. Adrenals/Urinary Tract: Adrenal glands and kidneys are unremarkable. Ureters are decompressed. Bladder is grossly unremarkable. Stomach/Bowel: Stomach, small bowel and colon are unremarkable. Appendix is not readily visualized. Vascular/Lymphatic: Atherosclerotic calcification of the aorta without aneurysm. Abdominal peritoneal ligament, retroperitoneal and mesenteric lymph nodes are subcentimeter in short axis size. Reproductive: Hysterectomy.  No adnexal mass. Other: Small residual pelvic ascites, much improved from 12/22/2018. Residual omental thickening is difficult to measure but there has been marked improvement from 12/22/2018. Musculoskeletal: Degenerative changes in the spine. Bilateral L5 pars defects with grade 1 anterolisthesis of L5 on S1. T12 compression fracture, unchanged. IMPRESSION: 1. Marked improvement in peritoneal carcinomatosis/ascites, without complete resolution. 2.  Aortic atherosclerosis (ICD10-170.0). Electronically Signed   By: MLorin PicketM.D.   On: 03/02/2019 13:40    All questions were answered. The patient knows to call the clinic with any problems, questions or concerns. No barriers to learning was detected.  I spent 15 minutes counseling the patient face to face. The total time spent in the appointment was 20 minutes and more than 50% was on counseling and review of test results  NHeath Lark MD 03/03/2019 12:41 PM

## 2019-03-03 NOTE — Assessment & Plan Note (Signed)
She has some mild anemia likely due to chemotherapy I do not plan to recheck iron studies until we have stop her treatment

## 2019-03-03 NOTE — Assessment & Plan Note (Signed)
I have reviewed multiple imaging studies with the patient She has excellent response to treatment with minimum residual disease We will continue current treatment for 3 more cycles before repeating imaging study in November

## 2019-03-03 NOTE — Assessment & Plan Note (Signed)
Her recent blood pressure control is excellent She will continue current blood pressure management and avoid extra salt intake if possible

## 2019-03-04 ENCOUNTER — Inpatient Hospital Stay: Payer: Medicare Other

## 2019-03-04 ENCOUNTER — Encounter: Payer: Medicare Other | Admitting: Nutrition

## 2019-03-04 ENCOUNTER — Other Ambulatory Visit: Payer: Self-pay

## 2019-03-04 VITALS — BP 122/59 | HR 79 | Temp 98.5°F | Resp 18

## 2019-03-04 DIAGNOSIS — C561 Malignant neoplasm of right ovary: Secondary | ICD-10-CM

## 2019-03-04 DIAGNOSIS — Z7189 Other specified counseling: Secondary | ICD-10-CM

## 2019-03-04 DIAGNOSIS — Z5112 Encounter for antineoplastic immunotherapy: Secondary | ICD-10-CM | POA: Diagnosis not present

## 2019-03-04 LAB — CBC WITH DIFFERENTIAL (CANCER CENTER ONLY)
Abs Immature Granulocytes: 0.04 10*3/uL (ref 0.00–0.07)
Basophils Absolute: 0 10*3/uL (ref 0.0–0.1)
Basophils Relative: 0 %
Eosinophils Absolute: 0 10*3/uL (ref 0.0–0.5)
Eosinophils Relative: 0 %
HCT: 30.7 % — ABNORMAL LOW (ref 36.0–46.0)
Hemoglobin: 9.7 g/dL — ABNORMAL LOW (ref 12.0–15.0)
Immature Granulocytes: 1 %
Lymphocytes Relative: 12 %
Lymphs Abs: 0.7 10*3/uL (ref 0.7–4.0)
MCH: 30.5 pg (ref 26.0–34.0)
MCHC: 31.6 g/dL (ref 30.0–36.0)
MCV: 96.5 fL (ref 80.0–100.0)
Monocytes Absolute: 0.1 10*3/uL (ref 0.1–1.0)
Monocytes Relative: 2 %
Neutro Abs: 4.9 10*3/uL (ref 1.7–7.7)
Neutrophils Relative %: 85 %
Platelet Count: 248 10*3/uL (ref 150–400)
RBC: 3.18 MIL/uL — ABNORMAL LOW (ref 3.87–5.11)
RDW: 18 % — ABNORMAL HIGH (ref 11.5–15.5)
WBC Count: 5.8 10*3/uL (ref 4.0–10.5)
nRBC: 0 % (ref 0.0–0.2)

## 2019-03-04 LAB — CMP (CANCER CENTER ONLY)
ALT: 33 U/L (ref 0–44)
AST: 25 U/L (ref 15–41)
Albumin: 3.8 g/dL (ref 3.5–5.0)
Alkaline Phosphatase: 85 U/L (ref 38–126)
Anion gap: 17 — ABNORMAL HIGH (ref 5–15)
BUN: 24 mg/dL — ABNORMAL HIGH (ref 8–23)
CO2: 22 mmol/L (ref 22–32)
Calcium: 9.2 mg/dL (ref 8.9–10.3)
Chloride: 99 mmol/L (ref 98–111)
Creatinine: 1.04 mg/dL — ABNORMAL HIGH (ref 0.44–1.00)
GFR, Est AFR Am: >60 mL/min (ref >60)
GFR, Estimated: 56 mL/min — ABNORMAL LOW (ref >60)
Glucose, Bld: 266 mg/dL — ABNORMAL HIGH (ref 70–99)
Potassium: 3.8 mmol/L (ref 3.5–5.1)
Sodium: 138 mmol/L (ref 135–145)
Total Bilirubin: 0.5 mg/dL (ref 0.3–1.2)
Total Protein: 7.8 g/dL (ref 6.5–8.1)

## 2019-03-04 LAB — TOTAL PROTEIN, URINE DIPSTICK: Protein, ur: 30 mg/dL — AB

## 2019-03-04 MED ORDER — DIPHENHYDRAMINE HCL 50 MG/ML IJ SOLN
INTRAMUSCULAR | Status: AC
  Filled 2019-03-04: qty 1

## 2019-03-04 MED ORDER — PALONOSETRON HCL INJECTION 0.25 MG/5ML
INTRAVENOUS | Status: AC
  Filled 2019-03-04: qty 5

## 2019-03-04 MED ORDER — PALONOSETRON HCL INJECTION 0.25 MG/5ML
0.2500 mg | Freq: Once | INTRAVENOUS | Status: AC
  Administered 2019-03-04: 09:00:00 0.25 mg via INTRAVENOUS

## 2019-03-04 MED ORDER — DIPHENHYDRAMINE HCL 25 MG PO CAPS
ORAL_CAPSULE | ORAL | Status: AC
  Filled 2019-03-04: qty 1

## 2019-03-04 MED ORDER — SODIUM CHLORIDE 0.9 % IV SOLN
14.7000 mg/kg | Freq: Once | INTRAVENOUS | Status: AC
  Administered 2019-03-04: 11:00:00 1600 mg via INTRAVENOUS
  Filled 2019-03-04: qty 64

## 2019-03-04 MED ORDER — HEPARIN SOD (PORK) LOCK FLUSH 100 UNIT/ML IV SOLN
500.0000 [IU] | Freq: Once | INTRAVENOUS | Status: AC | PRN
  Administered 2019-03-04: 16:00:00 500 [IU]
  Filled 2019-03-04: qty 5

## 2019-03-04 MED ORDER — SODIUM CHLORIDE 0.9 % IV SOLN
578.5000 mg | Freq: Once | INTRAVENOUS | Status: AC
  Administered 2019-03-04: 15:00:00 580 mg via INTRAVENOUS
  Filled 2019-03-04: qty 58

## 2019-03-04 MED ORDER — SODIUM CHLORIDE 0.9% FLUSH
10.0000 mL | INTRAVENOUS | Status: DC | PRN
  Administered 2019-03-04: 16:00:00 10 mL
  Filled 2019-03-04: qty 10

## 2019-03-04 MED ORDER — DIPHENHYDRAMINE HCL 50 MG/ML IJ SOLN
50.0000 mg | Freq: Once | INTRAMUSCULAR | Status: AC
  Administered 2019-03-04: 10:00:00 50 mg via INTRAVENOUS

## 2019-03-04 MED ORDER — SODIUM CHLORIDE 0.9 % IV SOLN
Freq: Once | INTRAVENOUS | Status: AC
  Administered 2019-03-04: 10:00:00 via INTRAVENOUS
  Filled 2019-03-04: qty 5

## 2019-03-04 MED ORDER — ACETAMINOPHEN 325 MG PO TABS
ORAL_TABLET | ORAL | Status: AC
  Filled 2019-03-04: qty 2

## 2019-03-04 MED ORDER — HEPARIN SOD (PORK) LOCK FLUSH 100 UNIT/ML IV SOLN
250.0000 [IU] | Freq: Once | INTRAVENOUS | Status: DC
  Filled 2019-03-04: qty 5

## 2019-03-04 MED ORDER — FAMOTIDINE IN NACL 20-0.9 MG/50ML-% IV SOLN
20.0000 mg | Freq: Once | INTRAVENOUS | Status: AC
  Administered 2019-03-04: 20 mg via INTRAVENOUS

## 2019-03-04 MED ORDER — SODIUM CHLORIDE 0.9% FLUSH
10.0000 mL | Freq: Once | INTRAVENOUS | Status: AC
  Administered 2019-03-04: 10 mL
  Filled 2019-03-04: qty 10

## 2019-03-04 MED ORDER — SODIUM CHLORIDE 0.9 % IV SOLN
140.0000 mg/m2 | Freq: Once | INTRAVENOUS | Status: AC
  Administered 2019-03-04: 12:00:00 318 mg via INTRAVENOUS
  Filled 2019-03-04: qty 53

## 2019-03-04 MED ORDER — FAMOTIDINE IN NACL 20-0.9 MG/50ML-% IV SOLN
INTRAVENOUS | Status: AC
  Filled 2019-03-04: qty 50

## 2019-03-04 MED ORDER — SODIUM CHLORIDE 0.9 % IV SOLN
Freq: Once | INTRAVENOUS | Status: AC
  Administered 2019-03-04: 09:00:00 via INTRAVENOUS
  Filled 2019-03-04: qty 250

## 2019-03-04 NOTE — Patient Instructions (Signed)
[facility] Discharge Instructions for Patients Receiving Chemotherapy  Today you received the following chemotherapy agents: Avastin/Taxol/Carboplatin.  To help prevent nausea and vomiting after your treatment, we encourage you to take your nausea medication as directed.   If you develop nausea and vomiting that is not controlled by your nausea medication, call the clinic.   BELOW ARE SYMPTOMS THAT SHOULD BE REPORTED IMMEDIATELY:  *FEVER GREATER THAN 100.5 F  *CHILLS WITH OR WITHOUT FEVER  NAUSEA AND VOMITING THAT IS NOT CONTROLLED WITH YOUR NAUSEA MEDICATION  *UNUSUAL SHORTNESS OF BREATH  *UNUSUAL BRUISING OR BLEEDING  TENDERNESS IN MOUTH AND THROAT WITH OR WITHOUT PRESENCE OF ULCERS  *URINARY PROBLEMS  *BOWEL PROBLEMS  UNUSUAL RASH Items with * indicate a potential emergency and should be followed up as soon as possible.  Feel free to call the clinic should you have any questions or concerns. The clinic phone number is [phone].  Please show the CHEMO ALERT CARD at check-in to the Emergency Department and triage nurse.   

## 2019-03-04 NOTE — Patient Instructions (Signed)

## 2019-03-16 ENCOUNTER — Other Ambulatory Visit: Payer: Self-pay

## 2019-03-16 ENCOUNTER — Telehealth: Payer: Self-pay

## 2019-03-16 MED ORDER — HYDROCHLOROTHIAZIDE 25 MG PO TABS: 25.0000 mg | ORAL_TABLET | Freq: Every day | ORAL | 3 refills | Status: DC

## 2019-03-16 NOTE — Telephone Encounter (Signed)
She called requesting 90 refill on hydrochlorothiazide. She gets it cheaper with 90 day refill. Rx sent to her requested pharmacy.

## 2019-03-25 ENCOUNTER — Inpatient Hospital Stay: Payer: Medicare Other

## 2019-03-25 ENCOUNTER — Encounter: Payer: Self-pay | Admitting: Hematology and Oncology

## 2019-03-25 ENCOUNTER — Other Ambulatory Visit: Payer: Self-pay

## 2019-03-25 ENCOUNTER — Inpatient Hospital Stay (HOSPITAL_BASED_OUTPATIENT_CLINIC_OR_DEPARTMENT_OTHER): Payer: Medicare Other | Admitting: Hematology and Oncology

## 2019-03-25 ENCOUNTER — Inpatient Hospital Stay: Payer: Medicare Other | Attending: Gynecology

## 2019-03-25 VITALS — BP 119/54 | HR 79

## 2019-03-25 DIAGNOSIS — D61818 Other pancytopenia: Secondary | ICD-10-CM | POA: Diagnosis not present

## 2019-03-25 DIAGNOSIS — Z7189 Other specified counseling: Secondary | ICD-10-CM

## 2019-03-25 DIAGNOSIS — Z5111 Encounter for antineoplastic chemotherapy: Secondary | ICD-10-CM | POA: Diagnosis present

## 2019-03-25 DIAGNOSIS — C561 Malignant neoplasm of right ovary: Secondary | ICD-10-CM | POA: Diagnosis present

## 2019-03-25 DIAGNOSIS — Z5112 Encounter for antineoplastic immunotherapy: Secondary | ICD-10-CM | POA: Diagnosis present

## 2019-03-25 DIAGNOSIS — G620 Drug-induced polyneuropathy: Secondary | ICD-10-CM

## 2019-03-25 DIAGNOSIS — I10 Essential (primary) hypertension: Secondary | ICD-10-CM | POA: Insufficient documentation

## 2019-03-25 DIAGNOSIS — T451X5A Adverse effect of antineoplastic and immunosuppressive drugs, initial encounter: Secondary | ICD-10-CM

## 2019-03-25 DIAGNOSIS — D61810 Antineoplastic chemotherapy induced pancytopenia: Secondary | ICD-10-CM | POA: Insufficient documentation

## 2019-03-25 LAB — CMP (CANCER CENTER ONLY)
ALT: 36 U/L (ref 0–44)
AST: 27 U/L (ref 15–41)
Albumin: 3.9 g/dL (ref 3.5–5.0)
Alkaline Phosphatase: 89 U/L (ref 38–126)
Anion gap: 14 (ref 5–15)
BUN: 28 mg/dL — ABNORMAL HIGH (ref 8–23)
CO2: 21 mmol/L — ABNORMAL LOW (ref 22–32)
Calcium: 8.8 mg/dL — ABNORMAL LOW (ref 8.9–10.3)
Chloride: 105 mmol/L (ref 98–111)
Creatinine: 1.01 mg/dL — ABNORMAL HIGH (ref 0.44–1.00)
GFR, Est AFR Am: >60 mL/min (ref >60)
GFR, Estimated: 58 mL/min — ABNORMAL LOW (ref >60)
Glucose, Bld: 190 mg/dL — ABNORMAL HIGH (ref 70–99)
Potassium: 4.1 mmol/L (ref 3.5–5.1)
Sodium: 140 mmol/L (ref 135–145)
Total Bilirubin: 0.3 mg/dL (ref 0.3–1.2)
Total Protein: 7.7 g/dL (ref 6.5–8.1)

## 2019-03-25 LAB — CBC WITH DIFFERENTIAL (CANCER CENTER ONLY)
Abs Immature Granulocytes: 0.01 10*3/uL (ref 0.00–0.07)
Basophils Absolute: 0 10*3/uL (ref 0.0–0.1)
Basophils Relative: 0 %
Eosinophils Absolute: 0 10*3/uL (ref 0.0–0.5)
Eosinophils Relative: 0 %
HCT: 26.3 % — ABNORMAL LOW (ref 36.0–46.0)
Hemoglobin: 8.4 g/dL — ABNORMAL LOW (ref 12.0–15.0)
Immature Granulocytes: 0 %
Lymphocytes Relative: 18 %
Lymphs Abs: 0.8 10*3/uL (ref 0.7–4.0)
MCH: 32.2 pg (ref 26.0–34.0)
MCHC: 31.9 g/dL (ref 30.0–36.0)
MCV: 100.8 fL — ABNORMAL HIGH (ref 80.0–100.0)
Monocytes Absolute: 0.2 10*3/uL (ref 0.1–1.0)
Monocytes Relative: 6 %
Neutro Abs: 3.3 10*3/uL (ref 1.7–7.7)
Neutrophils Relative %: 76 %
Platelet Count: 134 10*3/uL — ABNORMAL LOW (ref 150–400)
RBC: 2.61 MIL/uL — ABNORMAL LOW (ref 3.87–5.11)
RDW: 20.5 % — ABNORMAL HIGH (ref 11.5–15.5)
WBC Count: 4.4 10*3/uL (ref 4.0–10.5)
nRBC: 0.5 % — ABNORMAL HIGH (ref 0.0–0.2)

## 2019-03-25 LAB — TOTAL PROTEIN, URINE DIPSTICK: Protein, ur: <30 mg/dL — AB

## 2019-03-25 MED ORDER — SODIUM CHLORIDE 0.9% FLUSH
10.0000 mL | INTRAVENOUS | Status: DC | PRN
  Administered 2019-03-25: 10 mL
  Filled 2019-03-25: qty 10

## 2019-03-25 MED ORDER — SODIUM CHLORIDE 0.9 % IV SOLN
1600.0000 mg | Freq: Once | INTRAVENOUS | Status: AC
  Administered 2019-03-25: 1600 mg via INTRAVENOUS
  Filled 2019-03-25: qty 64

## 2019-03-25 MED ORDER — SODIUM CHLORIDE 0.9 % IV SOLN
Freq: Once | INTRAVENOUS | Status: AC
  Administered 2019-03-25: 10:00:00 via INTRAVENOUS
  Filled 2019-03-25: qty 250

## 2019-03-25 MED ORDER — HEPARIN SOD (PORK) LOCK FLUSH 100 UNIT/ML IV SOLN
500.0000 [IU] | Freq: Once | INTRAVENOUS | Status: AC | PRN
  Administered 2019-03-25: 500 [IU]
  Filled 2019-03-25: qty 5

## 2019-03-25 MED ORDER — DIPHENHYDRAMINE HCL 50 MG/ML IJ SOLN
INTRAMUSCULAR | Status: AC
  Filled 2019-03-25: qty 1

## 2019-03-25 MED ORDER — DIPHENHYDRAMINE HCL 50 MG/ML IJ SOLN
50.0000 mg | Freq: Once | INTRAMUSCULAR | Status: AC
  Administered 2019-03-25: 50 mg via INTRAVENOUS

## 2019-03-25 MED ORDER — SODIUM CHLORIDE 0.9% FLUSH
10.0000 mL | Freq: Once | INTRAVENOUS | Status: AC
  Administered 2019-03-25: 08:00:00 10 mL
  Filled 2019-03-25: qty 10

## 2019-03-25 MED ORDER — SODIUM CHLORIDE 0.9 % IV SOLN
Freq: Once | INTRAVENOUS | Status: DC
  Filled 2019-03-25: qty 250

## 2019-03-25 MED ORDER — PALONOSETRON HCL INJECTION 0.25 MG/5ML
INTRAVENOUS | Status: AC
  Filled 2019-03-25: qty 5

## 2019-03-25 MED ORDER — PALONOSETRON HCL INJECTION 0.25 MG/5ML
0.2500 mg | Freq: Once | INTRAVENOUS | Status: AC
  Administered 2019-03-25: 0.25 mg via INTRAVENOUS

## 2019-03-25 MED ORDER — SODIUM CHLORIDE 0.9 % IV SOLN
Freq: Once | INTRAVENOUS | Status: AC
  Administered 2019-03-25: 10:00:00 via INTRAVENOUS
  Filled 2019-03-25: qty 5

## 2019-03-25 MED ORDER — SODIUM CHLORIDE 0.9 % IV SOLN
520.0000 mg | Freq: Once | INTRAVENOUS | Status: AC
  Administered 2019-03-25: 520 mg via INTRAVENOUS
  Filled 2019-03-25: qty 52

## 2019-03-25 MED ORDER — FAMOTIDINE IN NACL 20-0.9 MG/50ML-% IV SOLN
20.0000 mg | Freq: Once | INTRAVENOUS | Status: AC
  Administered 2019-03-25: 20 mg via INTRAVENOUS

## 2019-03-25 MED ORDER — SODIUM CHLORIDE 0.9 % IV SOLN
140.0000 mg/m2 | Freq: Once | INTRAVENOUS | Status: AC
  Administered 2019-03-25: 318 mg via INTRAVENOUS
  Filled 2019-03-25: qty 53

## 2019-03-25 MED ORDER — FAMOTIDINE IN NACL 20-0.9 MG/50ML-% IV SOLN
INTRAVENOUS | Status: AC
  Filled 2019-03-25: qty 50

## 2019-03-25 NOTE — Progress Notes (Signed)
[facility] OFFICE PROGRESS NOTE  Patient Care Team: Jobe, Daniel B., MD as PCP - General (Internal Medicine)  ASSESSMENT & PLAN:  Ovarian cancer on right (HCC) She is getting progressively pancytopenic and she is symptomatic I recommend dose reduction of carboplatin We will proceed with treatment as scheduled  Pancytopenia, acquired (HCC) She has progressive pancytopenia due to treatment She is symptomatic However, with her current blood count, she does not qualify for transfusion support We discussed different strategies Her calculated chemotherapy dose is really high due to her massive body habitus I recommend mild dose reduction of carboplatin but to keep her at the every 3-week schedule We discussed the risk and benefits of this strategy and she agreed to proceed with the plan of care I told the patient her progressive pancytopenia is not due to iron deficiency but she may continue on oral iron supplement  Peripheral neuropathy due to chemotherapy (HCC) She denies worsening neuropathy We will continue similar dose  Essential hypertension Her blood pressure control at home is satisfactory She have minimum proteinuria We will continue treatment without change.   No orders of the defined types were placed in this encounter.   INTERVAL HISTORY: Please see below for problem oriented charting. She returns for cycle 5 of treatment She is symptomatic with anemia with shortness of breath on minimal exertion The patient denies any recent signs or symptoms of bleeding such as spontaneous epistaxis, hematuria or hematochezia. 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    03/20/2017 Imaging   GYN ultrasound was performed this revealed a small fundus measuring 5 cm there are 2 small fibroids within the fundus with the largest measuring 1.3 cm Within the endometrium there is a small amount a fluid the endometrial wall was slightly thickened at 4.8 mm the ultrasound was consistent with a small endometrial polyp  The right adnexa measured 7.4 x 6.7 cm contained a both a cystic and solid mass with a small amount of shadowing This is consistent with a dermoid tumor the left ovary was 2.6 x 1.5 x 1.0 cm There is no other adnexal masses no free fluid within the abdomen no ascites     04/10/2017 Surgery   Operation: Operative Laparscopy with  Peritoneal biopsy 2 hysteroscopy D&C Surgeon: Douglas K Louk MD  Specimens: Peritoneal biopsy and cellular washings  Complications: None FIndings Small amount of ascites was found cellular washings were sent There was peritoneal studding And omental caking Even in steep Trendelenburg the ovaries and uterus were unable to be visualized due to the small intestines in the lower pelvis     04/10/2017 Pathology Results   Biopsy from [facility] was nondiagnostic.  Peritoneum biopsy did not reveal malignancy.  Endometrial biopsy also did not reveal malignancy.   04/13/2017 Tumor Marker   Patient's tumor was tested for the following markers: CA-125 Results of the tumor marker test revealed 324.1   04/20/2017 Tumor Marker   Patient's tumor was tested for the following markers:   CA-125 Results of the tumor marker test revealed 345.6   04/22/2017 Pathology Results   Peritoneum, biopsy - CARCINOMA. - SEE COMMENT.   04/22/2017 Procedure   CT-guided core biopsy performed of peritoneal tumor in the  left lower quadrant.   04/24/2017 - 09/25/2017 Chemotherapy   She received carboplatin and Taxol x 3 cycles followed by interval surgical debulking on 12/31. Chemotherapy was subsequently resumed for 3 more cycles   06/05/2017 Tumor Marker   Patient's tumor was tested for the following markers: CA-125 Results of the tumor marker test revealed 27.5   06/17/2017 Imaging   1. Considerable improvement in the prior omental caking, which now presents mainly as an indistinct stranding with slight nodularity along the left upper quadrant omentum. 2. Stable appearance of right ovarian mass with fatty and calcific elements classic for a dermoid. 3. There is accentuated enhancement in the walls of the common hepatic duct and common bile duct. A low-grade cholangitis is not readily excluded. 4. Other imaging findings of potential clinical significance: Mild cardiomegaly. Aortic Atherosclerosis (ICD10-I70.0). Lumbar scoliosis, spondylosis, degenerative disc disease, and pars defects at L5. These cause impingement at L5-S1 and lesser impingement at L3-4 and L4-5. 5. 65% compression fracture at L1 slightly worsened than on the prior exam, with associated vertebral sclerosis and posterior retropulsion.   07/06/2017 Surgery   Bilateral Salpingo-Oophorectomy W/Omentectomy, Total Abd Hysterectomy & Radical Dissection For Debulking Right - Ureterolysis, With Or Without Repositioning Of Ureter For Retroperitoneal Fibrosis   07/06/2017 Pathology Results   A: Omentum, omentectomy - Omentum with minimal residual carcinoma, consistent with high grade serous carcinoma (microscopic; stage [id number]) - Extensive treatment effect with calcifications, fibrosis, histiocytes, and giant cell response - CRS score 3  B: Uterus with cervix and left ovary and fallopian tube, hysterectomy and left salpingo-oophorectomy - Cervix: Ectocervix and endocervix with no dysplasia or malignancy identified - Endometrium: Inactive with no  hyperplasia, atypia, or malignancy identified - Myometrium: Adenomyosis and benign leiomyoma, size 1.2 cm - Serosa: Focal calcification and histiocytes suggestive of treatment effect, with no viable carcinoma identified - Ovary, left: Physiologic changes and no carcinoma identified - Fallopian tube, left: 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 and no malignancy identified  C: Ovary and fallopian tube, right, salpingo-oophorectomy - Right fallopian tube with focal residual carcinoma, consistent with high grade serous carcinoma, and serous tubal intraepithelial carcinoma (STIC) - Associated calcifications and histiocytes suggestive of treatment response - Right ovary with calcifications and histiocytes suggestive of treatment effect, with no definite viable carcinoma identified - Mature cystic teratoma, 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 - See synoptic report and comment   08/14/2017 Tumor Marker   Patient's tumor was tested for the following markers: CA-125 Results of the tumor marker test revealed 14.6   09/04/2017 Tumor Marker   Patient's tumor was tested for the following markers: CA-125 Results of the tumor marker test revealed 12.5   10/03/2017 Genetic Testing   The Common Hereditary Cancer Panel offered by [facility] includes sequencing and/or deletion duplication testing of the following 47 genes: APC, ATM, AXIN2, BARD1, BMPR1A, BRCA1, BRCA2, BRIP1, CDH1, CDKN2A (p14ARF), CDKN2A (p16INK4a), CKD4, CHEK2, CTNNA1, DICER1, EPCAM (Deletion/duplication testing only), GREM1 (promoter region deletion/duplication testing only), KIT, MEN1, MLH1, MSH2, MSH3, MSH6, MUTYH, NBN, NF1, NHTL1, PALB2, PDGFRA, PMS2, POLD1, POLE, PTEN, RAD50, RAD51C, RAD51D, SDHB, SDHC, SDHD, SMAD4, SMARCA4. STK11, TP53, TSC1, TSC2, and VHL.  The following genes were evaluated for sequence changes only: SDHA and HOXB13 c.251G>A variant only.  Results: Negative, no pathogenic variants identified.    The date of this test report is 10/03/2017.     10/28/2017 Imaging   1. Stable pulmonary nodules. No new or progressive findings. 2. No residual or recurrent omental disease is identified. No obvious peritoneal surface disease. 3. Increasing soft tissue density and ill-defined interstitial changes around the abdominal wall surgical incision. This may be progressive postoperative changes or granulation tissue but attention on future scans to exclude the possibility of tumor. 4. Status post resection of right adnexal mass. No pelvic mass or pelvic adenopathy. 5. Stable T12 compression fracture.   01/25/2018 Tumor Marker   Patient's tumor was tested for the following markers: CA-125 Results of the tumor marker test revealed 9.2   12/10/2018 Tumor Marker   Patient's tumor was tested for the following markers: CA-125 Results of the tumor marker test revealed 212   12/22/2018 Imaging   1. Interval development of multiple soft tissue lesions in the gastrocolic ligament, mesentery, and along the peritoneal surface. These soft tissue lesions measure up to 6.5 cm in size. Associated interval development of moderate volume ascites in the abdomen and pelvis. Imaging features are consistent with metastatic disease. 2. Soft tissue projects through the rectus sheath in the right paraumbilical region, likely herniated small bowel as there is a large amount of decompressed small bowel does deep to this finding. However, metastatic deposit at this location is also possibility.  3. Trace pneumobilia in the left liver suggests prior sphincterotomy. 4.  Aortic Atherosclerois (ICD10-170.0)   12/30/2018 Procedure   Successful ultrasound-guided therapeutic paracentesis yielding 4.5 liters of peritoneal fluid.   12/30/2018 Procedure   Technically successful right IJ power-injectable port catheter placement. Ready for routine use.   12/31/2018 -  Chemotherapy   The patient had carboplatin and taxol for chemotherapy treatment.     01/21/2019 Tumor Marker    Patient's tumor was tested for the following markers: CA-125 Results of the tumor marker test revealed 408   02/11/2019 Tumor Marker   Patient's tumor was tested for the following markers: CA-125 Results of the tumor marker test revealed 646.   03/02/2019 Imaging   1. Marked improvement in peritoneal carcinomatosis/ascites, without complete resolution. 2.  Aortic atherosclerosis (ICD10-170.0).       REVIEW OF SYSTEMS:   Constitutional: Denies fevers, chills or abnormal weight loss Eyes: Denies blurriness of vision Ears, nose, mouth, throat, and face: Denies mucositis or sore throat Respiratory: Denies cough, dyspnea or wheezes Cardiovascular: Denies palpitation, chest discomfort or lower extremity swelling Gastrointestinal:  Denies nausea, heartburn or change in bowel habits Skin: Denies abnormal skin rashes Lymphatics: Denies new lymphadenopathy or easy bruising Neurological:Denies numbness, tingling or new weaknesses Behavioral/Psych: Mood is stable, no new changes  All other systems were reviewed with the patient and are negative.  I have reviewed the past medical history, past surgical history, social history and family history with the patient and they are unchanged from previous note.  ALLERGIES:  is allergic to aspirin; nsaids; and tramadol.  MEDICATIONS:  Current Outpatient Medications  Medication Sig Dispense Refill  . acetaminophen (TYLENOL) 650 MG CR tablet Take 1,300 mg by mouth every 8 (eight) hours.     . alendronate (FOSAMAX) 70 MG tablet Take 70 mg by mouth every Sunday.     . atorvastatin (LIPITOR) 80 MG tablet Take 40 mg by mouth at bedtime.     . Cholecalciferol (VITAMIN D3) 2000 units TABS Take 2,000 Units by mouth daily.    . citalopram (CELEXA) 10 MG tablet Take 10 mg by mouth   at bedtime    . dexamethasone (DECADRON) 4 MG tablet Take 3 tabs at the night before and 3 tabs the morning of chemotherapy, every 3 weeks, by mouth 36 tablet 0  . ferrous sulfate 325  (65 FE) MG tablet Take 325 mg by mouth daily. 3 hours after meal and 30 min before a meal    . gabapentin (NEURONTIN) 300 MG capsule Take by mouth.    . Glucosamine Sulfate 500 MG TABS Take 500 mg by mouth 2 (two) times daily.     . hydrochlorothiazide (HYDRODIURIL) 25 MG tablet Take 1 tablet (25 mg total) by mouth daily. 90 tablet 3  . lidocaine-prilocaine (EMLA) cream Apply to affected area once 30 g 3  . loratadine (CLARITIN) 10 MG tablet Take 10 mg by mouth at bedtime.     . losartan (COZAAR) 25 MG tablet Take 25 mg by mouth daily.     . meclizine (ANTIVERT) 25 MG tablet Take 25 mg by mouth 2 (two) times daily as needed for dizziness.     . metFORMIN (GLUCOPHAGE) 1000 MG tablet Take 1,000 mg by mouth 2 (two) times daily with a meal.     . omeprazole (PRILOSEC) 40 MG capsule Take 40 mg by mouth daily.     . ondansetron (ZOFRAN) 8 MG tablet Take 1 tablet (8 mg total) by mouth every 8 (eight) hours as needed for refractory nausea / vomiting. 30 tablet 1  . pioglitazone (ACTOS) 15 MG tablet Take 15 mg by mouth at bedtime.     . prochlorperazine (COMPAZINE) 10 MG tablet Take 1 tablet (10 mg total) by mouth every 6 (six) hours as needed (Nausea or vomiting). 30 tablet 1  . Pyridoxine HCl (VITAMIN B-6 PO) Take 50 mg by mouth 1 day or 1 dose.    . vitamin B-12 (CYANOCOBALAMIN) 1000 MCG tablet Take 1,000 mcg by mouth daily.      No current facility-administered medications for this visit.    Facility-Administered Medications Ordered in Other Visits  Medication Dose Route Frequency Provider Last Rate Last Dose  . sodium chloride flush (NS) 0.9 % injection 10 mL  10 mL Intracatheter Once Gorsuch, Ni, MD        PHYSICAL EXAMINATION: ECOG PERFORMANCE STATUS: 2 - Symptomatic, <50% confined to bed  Vitals:   03/25/19 0830 03/25/19 0831  BP: (!) 155/54 117/83  Pulse: (!) 109   Resp: 19   Temp: 98.7 F (37.1 C)   SpO2: 100%    Filed Weights   03/25/19 0830  Weight: 226 lb 3.2 oz (102.6 kg)     GENERAL:alert, no distress and comfortable SKIN: skin color, texture, turgor are normal, no rashes or significant lesions EYES: normal, Conjunctiva are pink and non-injected, sclera clear OROPHARYNX:no exudate, no erythema and lips, buccal mucosa, and tongue normal  NECK: supple, thyroid normal size, non-tender, without nodularity LYMPH:  no palpable lymphadenopathy in the cervical, axillary or inguinal LUNGS: clear to auscultation and percussion with normal breathing effort HEART: Mild tachycardia, no murmurs and no lower extremity edema ABDOMEN:abdomen soft, non-tender and normal bowel sounds Musculoskeletal:no cyanosis of digits and no clubbing  NEURO: alert & oriented x 3 with fluent speech, no focal motor/sensory deficits  LABORATORY DATA:  I have reviewed the data as listed    Component Value Date/Time   NA 140 03/25/2019 0815   NA 139 06/05/2017 0746   K 4.1 03/25/2019 0815   K 4.1 06/05/2017 0746   CL 105 03/25/2019 0815     CO2 21 (L) 03/25/2019 0815   CO2 21 (L) 06/05/2017 0746   GLUCOSE 190 (H) 03/25/2019 0815   GLUCOSE 279 (H) 06/05/2017 0746   BUN 28 (H) 03/25/2019 0815   BUN 15.0 06/05/2017 0746   CREATININE 1.01 (H) 03/25/2019 0815   CREATININE 0.9 06/05/2017 0746   CALCIUM 8.8 (L) 03/25/2019 0815   CALCIUM 10.1 06/05/2017 0746   PROT 7.7 03/25/2019 0815   PROT 8.1 06/05/2017 0746   ALBUMIN 3.9 03/25/2019 0815   ALBUMIN 4.0 06/05/2017 0746   AST 27 03/25/2019 0815   AST 22 06/05/2017 0746   ALT 36 03/25/2019 0815   ALT 36 06/05/2017 0746   ALKPHOS 89 03/25/2019 0815   ALKPHOS 98 06/05/2017 0746   BILITOT 0.3 03/25/2019 0815   BILITOT 0.44 06/05/2017 0746   GFRNONAA 58 (L) 03/25/2019 0815   GFRAA >60 03/25/2019 0815    No results found for: SPEP, UPEP  Lab Results  Component Value Date   WBC 4.4 03/25/2019   NEUTROABS 3.3 03/25/2019   HGB 8.4 (L) 03/25/2019   HCT 26.3 (L) 03/25/2019   MCV 100.8 (H) 03/25/2019   PLT 134 (L) 03/25/2019       Chemistry      Component Value Date/Time   NA 140 03/25/2019 0815   NA 139 06/05/2017 0746   K 4.1 03/25/2019 0815   K 4.1 06/05/2017 0746   CL 105 03/25/2019 0815   CO2 21 (L) 03/25/2019 0815   CO2 21 (L) 06/05/2017 0746   BUN 28 (H) 03/25/2019 0815   BUN 15.0 06/05/2017 0746   CREATININE 1.01 (H) 03/25/2019 0815   CREATININE 0.9 06/05/2017 0746      Component Value Date/Time   CALCIUM 8.8 (L) 03/25/2019 0815   CALCIUM 10.1 06/05/2017 0746   ALKPHOS 89 03/25/2019 0815   ALKPHOS 98 06/05/2017 0746   AST 27 03/25/2019 0815   AST 22 06/05/2017 0746   ALT 36 03/25/2019 0815   ALT 36 06/05/2017 0746   BILITOT 0.3 03/25/2019 0815   BILITOT 0.44 06/05/2017 0746       RADIOGRAPHIC STUDIES: I have personally reviewed the radiological images as listed and agreed with the findings in the report. Ct Abdomen Pelvis W Contrast  Result Date: 03/02/2019 CLINICAL DATA:  Recurrent ovarian cancer, ongoing chemotherapy. EXAM: CT ABDOMEN AND PELVIS WITH CONTRAST TECHNIQUE: Multidetector CT imaging of the abdomen and pelvis was performed using the standard protocol following bolus administration of intravenous contrast. CONTRAST:  170m OMNIPAQUE IOHEXOL 300 MG/ML  SOLN COMPARISON:  12/22/2018 and CT chest 10/28/2017. FINDINGS: Lower chest: Scattered subpleural nodules measure up to 6 mm in the right middle lobe (4/9), unchanged from 10/28/2017 and likely benign. Linear scarring in the lingula. Coronary artery calcification. Heart is at the upper limits of normal in size. No pericardial or pleural effusion. Hepatobiliary: Liver is unremarkable. Biliary ductal dilatation after cholecystectomy appears unchanged. The wall of the common bile duct appears thickened, similar. Pancreas: Negative. Spleen: Negative. Adrenals/Urinary Tract: Adrenal glands and kidneys are unremarkable. Ureters are decompressed. Bladder is grossly unremarkable. Stomach/Bowel: Stomach, small bowel and colon are unremarkable.  Appendix is not readily visualized. Vascular/Lymphatic: Atherosclerotic calcification of the aorta without aneurysm. Abdominal peritoneal ligament, retroperitoneal and mesenteric lymph nodes are subcentimeter in short axis size. Reproductive: Hysterectomy.  No adnexal mass. Other: Small residual pelvic ascites, much improved from 12/22/2018. Residual omental thickening is difficult to measure but there has been marked improvement from 12/22/2018. Musculoskeletal: Degenerative changes in the spine. Bilateral L5  pars defects with grade 1 anterolisthesis of L5 on S1. T12 compression fracture, unchanged. IMPRESSION: 1. Marked improvement in peritoneal carcinomatosis/ascites, without complete resolution. 2.  Aortic atherosclerosis (ICD10-170.0). Electronically Signed   By: Lorin Picket M.D.   On: 03/02/2019 13:40    All questions were answered. The patient knows to call the clinic with any problems, questions or concerns. No barriers to learning was detected.  I spent 25 minutes counseling the patient face to face. The total time spent in the appointment was 30 minutes and more than 50% was on counseling and review of test results  Heath Lark, MD 03/25/2019 9:05 AM

## 2019-03-25 NOTE — Assessment & Plan Note (Signed)
Her blood pressure control at home is satisfactory She have minimum proteinuria We will continue treatment without change.

## 2019-03-25 NOTE — Assessment & Plan Note (Signed)
She is getting progressively pancytopenic and she is symptomatic I recommend dose reduction of carboplatin We will proceed with treatment as scheduled

## 2019-03-25 NOTE — Assessment & Plan Note (Signed)
She has progressive pancytopenia due to treatment She is symptomatic However, with her current blood count, she does not qualify for transfusion support We discussed different strategies Her calculated chemotherapy dose is really high due to her massive body habitus I recommend mild dose reduction of carboplatin but to keep her at the every 3-week schedule We discussed the risk and benefits of this strategy and she agreed to proceed with the plan of care I told the patient her progressive pancytopenia is not due to iron deficiency but she may continue on oral iron supplement

## 2019-03-25 NOTE — Assessment & Plan Note (Signed)
She denies worsening neuropathy We will continue similar dose

## 2019-03-25 NOTE — Patient Instructions (Signed)
[facility] Discharge Instructions for Patients Receiving Chemotherapy  Today you received the following chemotherapy agents: Avastin/Taxol/Carboplatin.  To help prevent nausea and vomiting after your treatment, we encourage you to take your nausea medication as directed.   If you develop nausea and vomiting that is not controlled by your nausea medication, call the clinic.   BELOW ARE SYMPTOMS THAT SHOULD BE REPORTED IMMEDIATELY:  *FEVER GREATER THAN 100.5 F  *CHILLS WITH OR WITHOUT FEVER  NAUSEA AND VOMITING THAT IS NOT CONTROLLED WITH YOUR NAUSEA MEDICATION  *UNUSUAL SHORTNESS OF BREATH  *UNUSUAL BRUISING OR BLEEDING  TENDERNESS IN MOUTH AND THROAT WITH OR WITHOUT PRESENCE OF ULCERS  *URINARY PROBLEMS  *BOWEL PROBLEMS  UNUSUAL RASH Items with * indicate a potential emergency and should be followed up as soon as possible.  Feel free to call the clinic should you have any questions or concerns. The clinic phone number is [phone].  Please show the CHEMO ALERT CARD at check-in to the Emergency Department and triage nurse.   

## 2019-03-26 LAB — CA 125: Cancer Antigen (CA) 125: 24.1 U/mL (ref 0.0–38.1)

## 2019-03-28 ENCOUNTER — Telehealth: Payer: Self-pay

## 2019-03-28 NOTE — Telephone Encounter (Signed)
-----   Message from Heath Lark, MD sent at 03/28/2019  8:14 AM EDT ----- Regarding: CA-125 is normal, pls let her know

## 2019-03-28 NOTE — Telephone Encounter (Signed)
Called and given below message. She verbalized understanding. 

## 2019-04-15 ENCOUNTER — Inpatient Hospital Stay: Payer: Medicare Other

## 2019-04-15 ENCOUNTER — Other Ambulatory Visit: Payer: Self-pay

## 2019-04-15 ENCOUNTER — Inpatient Hospital Stay: Payer: Medicare Other | Attending: Gynecology

## 2019-04-15 ENCOUNTER — Inpatient Hospital Stay (HOSPITAL_BASED_OUTPATIENT_CLINIC_OR_DEPARTMENT_OTHER): Payer: Medicare Other | Admitting: Hematology and Oncology

## 2019-04-15 ENCOUNTER — Encounter: Payer: Self-pay | Admitting: Hematology and Oncology

## 2019-04-15 VITALS — BP 141/59 | HR 90 | Temp 98.7°F | Resp 18 | Ht 67.0 in | Wt 228.6 lb

## 2019-04-15 DIAGNOSIS — T451X5A Adverse effect of antineoplastic and immunosuppressive drugs, initial encounter: Secondary | ICD-10-CM | POA: Diagnosis not present

## 2019-04-15 DIAGNOSIS — C561 Malignant neoplasm of right ovary: Secondary | ICD-10-CM

## 2019-04-15 DIAGNOSIS — Z5111 Encounter for antineoplastic chemotherapy: Secondary | ICD-10-CM | POA: Diagnosis present

## 2019-04-15 DIAGNOSIS — I10 Essential (primary) hypertension: Secondary | ICD-10-CM

## 2019-04-15 DIAGNOSIS — D6481 Anemia due to antineoplastic chemotherapy: Secondary | ICD-10-CM | POA: Insufficient documentation

## 2019-04-15 DIAGNOSIS — D61810 Antineoplastic chemotherapy induced pancytopenia: Secondary | ICD-10-CM | POA: Insufficient documentation

## 2019-04-15 DIAGNOSIS — Z7189 Other specified counseling: Secondary | ICD-10-CM

## 2019-04-15 DIAGNOSIS — Z5112 Encounter for antineoplastic immunotherapy: Secondary | ICD-10-CM | POA: Diagnosis not present

## 2019-04-15 DIAGNOSIS — Z452 Encounter for adjustment and management of vascular access device: Secondary | ICD-10-CM | POA: Diagnosis not present

## 2019-04-15 LAB — CMP (CANCER CENTER ONLY)
ALT: 38 U/L (ref 0–44)
AST: 27 U/L (ref 15–41)
Albumin: 3.6 g/dL (ref 3.5–5.0)
Alkaline Phosphatase: 101 U/L (ref 38–126)
Anion gap: 17 — ABNORMAL HIGH (ref 5–15)
BUN: 25 mg/dL — ABNORMAL HIGH (ref 8–23)
CO2: 19 mmol/L — ABNORMAL LOW (ref 22–32)
Calcium: 8.8 mg/dL — ABNORMAL LOW (ref 8.9–10.3)
Chloride: 103 mmol/L (ref 98–111)
Creatinine: 1.08 mg/dL — ABNORMAL HIGH (ref 0.44–1.00)
GFR, Est AFR Am: >60 mL/min (ref >60)
GFR, Estimated: 53 mL/min — ABNORMAL LOW (ref >60)
Glucose, Bld: 264 mg/dL — ABNORMAL HIGH (ref 70–99)
Potassium: 4.5 mmol/L (ref 3.5–5.1)
Sodium: 139 mmol/L (ref 135–145)
Total Bilirubin: 0.4 mg/dL (ref 0.3–1.2)
Total Protein: 7.8 g/dL (ref 6.5–8.1)

## 2019-04-15 LAB — CBC WITH DIFFERENTIAL (CANCER CENTER ONLY)
Abs Immature Granulocytes: 0.04 10*3/uL (ref 0.00–0.07)
Basophils Absolute: 0 10*3/uL (ref 0.0–0.1)
Basophils Relative: 0 %
Eosinophils Absolute: 0 10*3/uL (ref 0.0–0.5)
Eosinophils Relative: 0 %
HCT: 26.1 % — ABNORMAL LOW (ref 36.0–46.0)
Hemoglobin: 8.1 g/dL — ABNORMAL LOW (ref 12.0–15.0)
Immature Granulocytes: 1 %
Lymphocytes Relative: 15 %
Lymphs Abs: 0.7 10*3/uL (ref 0.7–4.0)
MCH: 33.1 pg (ref 26.0–34.0)
MCHC: 31 g/dL (ref 30.0–36.0)
MCV: 106.5 fL — ABNORMAL HIGH (ref 80.0–100.0)
Monocytes Absolute: 0.1 10*3/uL (ref 0.1–1.0)
Monocytes Relative: 2 %
Neutro Abs: 3.9 10*3/uL (ref 1.7–7.7)
Neutrophils Relative %: 82 %
Platelet Count: 192 10*3/uL (ref 150–400)
RBC: 2.45 MIL/uL — ABNORMAL LOW (ref 3.87–5.11)
RDW: 20.5 % — ABNORMAL HIGH (ref 11.5–15.5)
WBC Count: 4.8 10*3/uL (ref 4.0–10.5)
nRBC: 0 % (ref 0.0–0.2)

## 2019-04-15 MED ORDER — DEXAMETHASONE 4 MG PO TABS: ORAL_TABLET | ORAL | 0 refills | Status: DC

## 2019-04-15 MED ORDER — SODIUM CHLORIDE 0.9% FLUSH
10.0000 mL | Freq: Once | INTRAVENOUS | Status: AC
  Administered 2019-04-15: 08:00:00 10 mL
  Filled 2019-04-15: qty 10

## 2019-04-15 NOTE — Assessment & Plan Note (Signed)
She has progressive pancytopenia due to treatment She is symptomatic However, with her current blood count, she does not qualify for transfusion support We discussed different strategies We will proceed with delaying treatment by 1 week as above

## 2019-04-15 NOTE — Assessment & Plan Note (Signed)
Her blood pressure control at home is satisfactory She have minimum proteinuria We will continue treatment without change.

## 2019-04-15 NOTE — Progress Notes (Signed)
[facility] OFFICE PROGRESS NOTE  Patient Care Team: Lilian Coma., MD as PCP - General (Internal Medicine)  ASSESSMENT & PLAN:  Ovarian cancer on right [facility]) She is profoundly symptomatic from anemia Based on her current blood work, she does not qualify for transfusion support I recommend delaying chemotherapy by 1 week Her dose of chemotherapy was recently adjusted We discussed the risk and benefits of delaying chemotherapy versus transfusion support and ultimately she agreed to delay her final dose of treatment by 1 week I will schedule CT imaging in November and if results are favorable, we will proceed with maintenance bevacizumab only next month  Anemia due to antineoplastic chemotherapy She has progressive pancytopenia due to treatment She is symptomatic However, with her current blood count, she does not qualify for transfusion support We discussed different strategies We will proceed with delaying treatment by 1 week as above  Essential hypertension Her blood pressure control at home is satisfactory She have minimum proteinuria We will continue treatment without change.   Orders Placed This Encounter  Procedures  . CT ABDOMEN PELVIS W CONTRAST    Standing Status:   Future    Standing Expiration Date:   04/14/2020    Order Specific Question:   If indicated for the ordered procedure, I authorize the administration of contrast media per Radiology protocol    Answer:   Yes    Order Specific Question:   Preferred imaging location?    Answer:   [facility]    Order Specific Question:   Radiology Contrast Protocol - do NOT remove file path    Answer:   [email or web address]    INTERVAL HISTORY: Please see below for problem oriented charting. She returns for cycle 6 of treatment She is symptomatic with excessive fatigue Denies chest pain or shortness of breath The patient denies any recent signs or symptoms of bleeding such  as spontaneous epistaxis, hematuria or hematochezia. 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    03/20/2017 Imaging   GYN ultrasound was performed this revealed a small fundus measuring 5 cm there are 2 small fibroids within the fundus with the largest measuring 1.3 cm Within the endometrium there is a small amount a fluid the endometrial wall was slightly thickened at 4.8 mm the ultrasound was consistent with a small endometrial polyp  The right adnexa measured 7.4 x 6.7 cm contained a both a cystic and solid mass with a small amount of shadowing This is consistent with a dermoid tumor the left ovary was 2.6 x 1.5 x 1.0 cm There is no other adnexal masses no free fluid within the abdomen no ascites     04/10/2017 Surgery   Operation: Operative Laparscopy with  Peritoneal biopsy 2 hysteroscopy D&C Surgeon: Lynder Parents MD  Specimens: Peritoneal biopsy and cellular washings  Complications: None FIndings Small amount of ascites was found cellular washings were sent There was peritoneal studding And omental caking Even in steep Trendelenburg the ovaries and uterus were unable to be visualized due to the small intestines in the  lower pelvis     04/10/2017 Pathology Results   Biopsy from [facility] was nondiagnostic.  Peritoneum biopsy did not reveal malignancy.  Endometrial biopsy also did not reveal malignancy.   04/13/2017 Tumor Marker   Patient's tumor was tested for the following markers: CA-125 Results of the tumor  marker test revealed 324.1   04/20/2017 Tumor Marker   Patient's tumor was tested for the following markers: CA-125 Results of the tumor marker test revealed 345.6   04/22/2017 Pathology Results   Peritoneum, biopsy - CARCINOMA. - SEE COMMENT.   04/22/2017 Procedure   CT-guided core biopsy performed of peritoneal tumor in the left lower quadrant.   04/24/2017 - 09/25/2017 Chemotherapy   She received carboplatin and Taxol x 3 cycles followed by interval surgical debulking on 12/31. Chemotherapy was subsequently resumed for 3 more cycles   06/05/2017 Tumor Marker   Patient's tumor was tested for the following markers: CA-125 Results of the tumor marker test revealed 27.5   06/17/2017 Imaging   1. Considerable improvement in the prior omental caking, which now presents mainly as an indistinct stranding with slight nodularity along the left upper quadrant omentum. 2. Stable appearance of right ovarian mass with fatty and calcific elements classic for a dermoid. 3. There is accentuated enhancement in the walls of the common hepatic duct and common bile duct. A low-grade cholangitis is not readily excluded. 4. Other imaging findings of potential clinical significance: Mild cardiomegaly. Aortic Atherosclerosis (ICD10-I70.0). Lumbar scoliosis, spondylosis, degenerative disc disease, and pars defects at L5. These cause impingement at L5-S1 and lesser impingement at L3-4 and L4-5. 5. 65% compression fracture at L1 slightly worsened than on the prior exam, with associated vertebral sclerosis and posterior retropulsion.   07/06/2017 Surgery   Bilateral Salpingo-Oophorectomy W/Omentectomy, Total Abd Hysterectomy & Radical Dissection For Debulking Right - Ureterolysis, With Or Without Repositioning Of Ureter For Retroperitoneal Fibrosis   07/06/2017 Pathology Results   A: Omentum, omentectomy - Omentum with minimal residual carcinoma, consistent with high grade serous carcinoma (microscopic; stage  ypT3a) - Extensive treatment effect with calcifications, fibrosis, histiocytes, and giant cell response - CRS score 3  B: Uterus with cervix and left ovary and fallopian tube, hysterectomy and left salpingo-oophorectomy - Cervix: Ectocervix and endocervix with no dysplasia or malignancy identified - Endometrium: Inactive with no hyperplasia, atypia, or malignancy identified - Myometrium: Adenomyosis and benign leiomyoma, size 1.2 cm - Serosa: Focal calcification and histiocytes suggestive of treatment effect, with no viable carcinoma identified - Ovary, left: Physiologic changes and no carcinoma identified - Fallopian tube, left: 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 and no malignancy identified  C: Ovary and fallopian tube, right, salpingo-oophorectomy - Right fallopian tube with focal residual carcinoma, consistent with high grade serous carcinoma, and serous tubal intraepithelial carcinoma (STIC) - Associated calcifications and histiocytes suggestive of treatment response - Right ovary with calcifications and histiocytes suggestive of treatment effect, with no definite viable carcinoma identified - Mature cystic teratoma, 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 - See synoptic report and comment   08/14/2017 Tumor Marker   Patient's tumor was tested for the following markers: CA-125 Results of the tumor marker test revealed 14.6   09/04/2017 Tumor Marker   Patient's tumor was tested for the following markers: CA-125 Results of the tumor marker test revealed 12.5   10/03/2017 Genetic Testing   The Common Hereditary Cancer Panel offered by [facility] includes sequencing and/or deletion duplication testing of the following 47 genes: APC, ATM, AXIN2, BARD1, BMPR1A, BRCA1,  BRCA2, BRIP1, CDH1, CDKN2A (p14ARF), CDKN2A (p16INK4a), CKD4, CHEK2, CTNNA1, DICER1, EPCAM (Deletion/duplication testing only), GREM1 (promoter region deletion/duplication testing only), KIT, MEN1, MLH1, MSH2, MSH3, MSH6, MUTYH, NBN, NF1, NHTL1,  PALB2, PDGFRA, PMS2, POLD1, POLE, PTEN, RAD50, RAD51C, RAD51D, SDHB, SDHC, SDHD, SMAD4, SMARCA4. STK11, TP53, TSC1, TSC2, and VHL.  The following genes were evaluated for sequence changes only: SDHA and HOXB13 c.251G>A variant only.  Results: Negative, no pathogenic variants identified.  The date of this test report is 10/03/2017.    10/28/2017 Imaging   1. Stable pulmonary nodules. No new or progressive findings. 2. No residual or recurrent omental disease is identified. No obvious peritoneal surface disease. 3. Increasing soft tissue density and ill-defined interstitial changes around the abdominal wall surgical incision. This may be progressive postoperative changes or granulation tissue but attention on future scans to exclude the possibility of tumor. 4. Status post resection of right adnexal mass. No pelvic mass or pelvic adenopathy. 5. Stable T12 compression fracture.   01/25/2018 Tumor Marker   Patient's tumor was tested for the following markers: CA-125 Results of the tumor marker test revealed 9.2   12/10/2018 Tumor Marker   Patient's tumor was tested for the following markers: CA-125 Results of the tumor marker test revealed 212   12/22/2018 Imaging   1. Interval development of multiple soft tissue lesions in the gastrocolic ligament, mesentery, and along the peritoneal surface. These soft tissue lesions measure up to 6.5 cm in size. Associated interval development of moderate volume ascites in the abdomen and pelvis. Imaging features are consistent with metastatic disease. 2. Soft tissue projects through the rectus sheath in the right paraumbilical region, likely herniated small bowel as there is a large amount of decompressed small bowel does deep to this finding. However, metastatic deposit at this location is also possibility.  3. Trace pneumobilia in the left liver suggests prior sphincterotomy. 4.  Aortic Atherosclerois (ICD10-170.0)   12/30/2018 Procedure   Successful  ultrasound-guided therapeutic paracentesis yielding 4.5 liters of peritoneal fluid.   12/30/2018 Procedure   Technically successful right IJ power-injectable port catheter placement. Ready for routine use.   12/31/2018 -  Chemotherapy   The patient had carboplatin and taxol for chemotherapy treatment.     01/21/2019 Tumor Marker   Patient's tumor was tested for the following markers: CA-125 Results of the tumor marker test revealed 408   02/11/2019 Tumor Marker   Patient's tumor was tested for the following markers: CA-125 Results of the tumor marker test revealed 646.   03/02/2019 Imaging   1. Marked improvement in peritoneal carcinomatosis/ascites, without complete resolution. 2.  Aortic atherosclerosis (ICD10-170.0).     03/25/2019 Tumor Marker   Patient's tumor was tested for the following markers: CA-125 Results of the tumor marker test revealed 24.1.     REVIEW OF SYSTEMS:   Constitutional: Denies fevers, chills or abnormal weight loss Eyes: Denies blurriness of vision Ears, nose, mouth, throat, and face: Denies mucositis or sore throat Respiratory: Denies cough, dyspnea or wheezes Cardiovascular: Denies palpitation, chest discomfort or lower extremity swelling Gastrointestinal:  Denies nausea, heartburn or change in bowel habits Skin: Denies abnormal skin rashes Lymphatics: Denies new lymphadenopathy or easy bruising Neurological:Denies numbness, tingling or new weaknesses Behavioral/Psych: Mood is stable, no new changes  All other systems were reviewed with the patient and are negative.  I have reviewed the past medical history, past surgical history, social history and family history with the patient and they are unchanged from previous note.  ALLERGIES:  is  allergic to aspirin; nsaids; and tramadol.  MEDICATIONS:  Current Outpatient Medications  Medication Sig Dispense Refill  . acetaminophen (TYLENOL) 650 MG CR tablet Take 1,300 mg by mouth every 8 (eight) hours.      Marland Kitchen alendronate (FOSAMAX) 70 MG tablet Take 70 mg by mouth every Sunday.     Marland Kitchen atorvastatin (LIPITOR) 80 MG tablet Take 40 mg by mouth at bedtime.     . Cholecalciferol (VITAMIN D3) 2000 units TABS Take 2,000 Units by mouth daily.    . citalopram (CELEXA) 10 MG tablet Take 10 mg by mouth at bedtime    . dexamethasone (DECADRON) 4 MG tablet Take 3 tabs at the night before and 3 tabs the morning of chemotherapy, every 3 weeks, by mouth 36 tablet 0  . ferrous sulfate 325 (65 FE) MG tablet Take 325 mg by mouth daily. 3 hours after meal and 30 min before a meal    . gabapentin (NEURONTIN) 300 MG capsule Take by mouth.    . Glucosamine Sulfate 500 MG TABS Take 500 mg by mouth 2 (two) times daily.     . hydrochlorothiazide (HYDRODIURIL) 25 MG tablet Take 1 tablet (25 mg total) by mouth daily. 90 tablet 3  . lidocaine-prilocaine (EMLA) cream Apply to affected area once 30 g 3  . loratadine (CLARITIN) 10 MG tablet Take 10 mg by mouth at bedtime.     Marland Kitchen losartan (COZAAR) 25 MG tablet Take 25 mg by mouth daily.     . meclizine (ANTIVERT) 25 MG tablet Take 25 mg by mouth 2 (two) times daily as needed for dizziness.     . metFORMIN (GLUCOPHAGE) 1000 MG tablet Take 1,000 mg by mouth 2 (two) times daily with a meal.     . omeprazole (PRILOSEC) 40 MG capsule Take 40 mg by mouth daily.     . ondansetron (ZOFRAN) 8 MG tablet Take 1 tablet (8 mg total) by mouth every 8 (eight) hours as needed for refractory nausea / vomiting. 30 tablet 1  . pioglitazone (ACTOS) 15 MG tablet Take 15 mg by mouth at bedtime.     . prochlorperazine (COMPAZINE) 10 MG tablet Take 1 tablet (10 mg total) by mouth every 6 (six) hours as needed (Nausea or vomiting). 30 tablet 1  . Pyridoxine HCl (VITAMIN B-6 PO) Take 50 mg by mouth 1 day or 1 dose.    . vitamin B-12 (CYANOCOBALAMIN) 1000 MCG tablet Take 1,000 mcg by mouth daily.      No current facility-administered medications for this visit.    Facility-Administered Medications Ordered  in Other Visits  Medication Dose Route Frequency Provider Last Rate Last Dose  . sodium chloride flush (NS) 0.9 % injection 10 mL  10 mL Intracatheter Once Heath Lark, MD        PHYSICAL EXAMINATION: ECOG PERFORMANCE STATUS: 1 - Symptomatic but completely ambulatory  Vitals:   04/15/19 0847  BP: (!) 141/59  Pulse: 90  Resp: 18  Temp: 98.7 F (37.1 C)  SpO2: 99%   Filed Weights   04/15/19 0847  Weight: 228 lb 9.6 oz (103.7 kg)    GENERAL:alert, no distress and comfortable.  She looks pale, sitting on the wheelchair SKIN: skin color, texture, turgor are normal, no rashes or significant lesions EYES: normal, Conjunctiva are pink and non-injected, sclera clear OROPHARYNX:no exudate, no erythema and lips, buccal mucosa, and tongue normal  NECK: supple, thyroid normal size, non-tender, without nodularity LYMPH:  no palpable lymphadenopathy in the cervical, axillary or  inguinal LUNGS: clear to auscultation and percussion with normal breathing effort HEART: regular rate & rhythm and no murmurs and no lower extremity edema ABDOMEN:abdomen soft, non-tender and normal bowel sounds Musculoskeletal:no cyanosis of digits and no clubbing  NEURO: alert & oriented x 3 with fluent speech, no focal motor/sensory deficits  LABORATORY DATA:  I have reviewed the data as listed    Component Value Date/Time   NA 139 04/15/2019 0811   NA 139 06/05/2017 0746   K 4.5 04/15/2019 0811   K 4.1 06/05/2017 0746   CL 103 04/15/2019 0811   CO2 19 (L) 04/15/2019 0811   CO2 21 (L) 06/05/2017 0746   GLUCOSE 264 (H) 04/15/2019 0811   GLUCOSE 279 (H) 06/05/2017 0746   BUN 25 (H) 04/15/2019 0811   BUN 15.0 06/05/2017 0746   CREATININE 1.08 (H) 04/15/2019 0811   CREATININE 0.9 06/05/2017 0746   CALCIUM 8.8 (L) 04/15/2019 0811   CALCIUM 10.1 06/05/2017 0746   PROT 7.8 04/15/2019 0811   PROT 8.1 06/05/2017 0746   ALBUMIN 3.6 04/15/2019 0811   ALBUMIN 4.0 06/05/2017 0746   AST 27 04/15/2019 0811   AST  22 06/05/2017 0746   ALT 38 04/15/2019 0811   ALT 36 06/05/2017 0746   ALKPHOS 101 04/15/2019 0811   ALKPHOS 98 06/05/2017 0746   BILITOT 0.4 04/15/2019 0811   BILITOT 0.44 06/05/2017 0746   GFRNONAA 53 (L) 04/15/2019 0811   GFRAA >60 04/15/2019 0811    No results found for: SPEP, UPEP  Lab Results  Component Value Date   WBC 4.8 04/15/2019   NEUTROABS 3.9 04/15/2019   HGB 8.1 (L) 04/15/2019   HCT 26.1 (L) 04/15/2019   MCV 106.5 (H) 04/15/2019   PLT 192 04/15/2019      Chemistry      Component Value Date/Time   NA 139 04/15/2019 0811   NA 139 06/05/2017 0746   K 4.5 04/15/2019 0811   K 4.1 06/05/2017 0746   CL 103 04/15/2019 0811   CO2 19 (L) 04/15/2019 0811   CO2 21 (L) 06/05/2017 0746   BUN 25 (H) 04/15/2019 0811   BUN 15.0 06/05/2017 0746   CREATININE 1.08 (H) 04/15/2019 0811   CREATININE 0.9 06/05/2017 0746      Component Value Date/Time   CALCIUM 8.8 (L) 04/15/2019 0811   CALCIUM 10.1 06/05/2017 0746   ALKPHOS 101 04/15/2019 0811   ALKPHOS 98 06/05/2017 0746   AST 27 04/15/2019 0811   AST 22 06/05/2017 0746   ALT 38 04/15/2019 0811   ALT 36 06/05/2017 0746   BILITOT 0.4 04/15/2019 0811   BILITOT 0.44 06/05/2017 0746      All questions were answered. The patient knows to call the clinic with any problems, questions or concerns. No barriers to learning was detected.  I spent 25 minutes counseling the patient face to face. The total time spent in the appointment was 30 minutes and more than 50% was on counseling and review of test results  Heath Lark, MD 04/15/2019 9:24 AM

## 2019-04-15 NOTE — Assessment & Plan Note (Signed)
She is profoundly symptomatic from anemia Based on her current blood work, she does not qualify for transfusion support I recommend delaying chemotherapy by 1 week Her dose of chemotherapy was recently adjusted We discussed the risk and benefits of delaying chemotherapy versus transfusion support and ultimately she agreed to delay her final dose of treatment by 1 week I will schedule CT imaging in November and if results are favorable, we will proceed with maintenance bevacizumab only next month

## 2019-04-16 LAB — CA 125: Cancer Antigen (CA) 125: 15.3 U/mL (ref 0.0–38.1)

## 2019-04-18 ENCOUNTER — Telehealth: Payer: Self-pay | Admitting: *Deleted

## 2019-04-18 NOTE — Telephone Encounter (Signed)
-----   Message from Heath Lark, MD sent at 04/18/2019  8:07 AM EDT ----- Regarding: Pls let her know CA-125 is better

## 2019-04-18 NOTE — Telephone Encounter (Signed)
Patient advised of lab results as directed below. Patient appreciated call.

## 2019-04-19 ENCOUNTER — Telehealth: Payer: Self-pay | Admitting: Hematology and Oncology

## 2019-04-19 NOTE — Telephone Encounter (Signed)
I talk with patient regarding 10/16 °

## 2019-04-20 NOTE — Progress Notes (Signed)
The following biosimilar Zirabev (bevacizumab-bvzr) has been selected for use in this patient.  Maggie Kharisma Glasner, PharmD, BCPS Oncology Pharmacist Pharmacy Phone: [phone] 04/20/2019     

## 2019-04-22 ENCOUNTER — Other Ambulatory Visit: Payer: Self-pay

## 2019-04-22 ENCOUNTER — Other Ambulatory Visit: Payer: Medicare Other

## 2019-04-22 ENCOUNTER — Inpatient Hospital Stay: Payer: Medicare Other

## 2019-04-22 VITALS — BP 138/67 | HR 93 | Temp 98.9°F | Resp 18 | Wt 222.2 lb

## 2019-04-22 DIAGNOSIS — Z5112 Encounter for antineoplastic immunotherapy: Secondary | ICD-10-CM | POA: Diagnosis not present

## 2019-04-22 DIAGNOSIS — C561 Malignant neoplasm of right ovary: Secondary | ICD-10-CM

## 2019-04-22 DIAGNOSIS — Z7189 Other specified counseling: Secondary | ICD-10-CM

## 2019-04-22 LAB — CBC WITH DIFFERENTIAL (CANCER CENTER ONLY)
Abs Immature Granulocytes: 0.03 10*3/uL (ref 0.00–0.07)
Basophils Absolute: 0 10*3/uL (ref 0.0–0.1)
Basophils Relative: 0 %
Eosinophils Absolute: 0 10*3/uL (ref 0.0–0.5)
Eosinophils Relative: 0 %
HCT: 26.9 % — ABNORMAL LOW (ref 36.0–46.0)
Hemoglobin: 8.5 g/dL — ABNORMAL LOW (ref 12.0–15.0)
Immature Granulocytes: 1 %
Lymphocytes Relative: 16 %
Lymphs Abs: 0.8 10*3/uL (ref 0.7–4.0)
MCH: 33.9 pg (ref 26.0–34.0)
MCHC: 31.6 g/dL (ref 30.0–36.0)
MCV: 107.2 fL — ABNORMAL HIGH (ref 80.0–100.0)
Monocytes Absolute: 0.2 10*3/uL (ref 0.1–1.0)
Monocytes Relative: 5 %
Neutro Abs: 3.9 10*3/uL (ref 1.7–7.7)
Neutrophils Relative %: 78 %
Platelet Count: 234 10*3/uL (ref 150–400)
RBC: 2.51 MIL/uL — ABNORMAL LOW (ref 3.87–5.11)
RDW: 19.6 % — ABNORMAL HIGH (ref 11.5–15.5)
WBC Count: 4.9 10*3/uL (ref 4.0–10.5)
nRBC: 0 % (ref 0.0–0.2)

## 2019-04-22 LAB — CMP (CANCER CENTER ONLY)
ALT: 35 U/L (ref 0–44)
AST: 24 U/L (ref 15–41)
Albumin: 3.8 g/dL (ref 3.5–5.0)
Alkaline Phosphatase: 95 U/L (ref 38–126)
Anion gap: 17 — ABNORMAL HIGH (ref 5–15)
BUN: 30 mg/dL — ABNORMAL HIGH (ref 8–23)
CO2: 20 mmol/L — ABNORMAL LOW (ref 22–32)
Calcium: 9.1 mg/dL (ref 8.9–10.3)
Chloride: 103 mmol/L (ref 98–111)
Creatinine: 1.22 mg/dL — ABNORMAL HIGH (ref 0.44–1.00)
GFR, Est AFR Am: 53 mL/min — ABNORMAL LOW (ref >60)
GFR, Estimated: 46 mL/min — ABNORMAL LOW (ref >60)
Glucose, Bld: 271 mg/dL — ABNORMAL HIGH (ref 70–99)
Potassium: 4.5 mmol/L (ref 3.5–5.1)
Sodium: 140 mmol/L (ref 135–145)
Total Bilirubin: 0.3 mg/dL (ref 0.3–1.2)
Total Protein: 7.8 g/dL (ref 6.5–8.1)

## 2019-04-22 LAB — TOTAL PROTEIN, URINE DIPSTICK

## 2019-04-22 MED ORDER — SODIUM CHLORIDE 0.9% FLUSH
10.0000 mL | Freq: Once | INTRAVENOUS | Status: AC
  Administered 2019-04-22: 10 mL
  Filled 2019-04-22: qty 10

## 2019-04-22 MED ORDER — FAMOTIDINE IN NACL 20-0.9 MG/50ML-% IV SOLN
20.0000 mg | Freq: Once | INTRAVENOUS | Status: AC
  Administered 2019-04-22: 20 mg via INTRAVENOUS

## 2019-04-22 MED ORDER — FAMOTIDINE IN NACL 20-0.9 MG/50ML-% IV SOLN
INTRAVENOUS | Status: AC
  Filled 2019-04-22: qty 50

## 2019-04-22 MED ORDER — DIPHENHYDRAMINE HCL 50 MG/ML IJ SOLN
INTRAMUSCULAR | Status: AC
  Filled 2019-04-22: qty 1

## 2019-04-22 MED ORDER — PALONOSETRON HCL INJECTION 0.25 MG/5ML
INTRAVENOUS | Status: AC
  Filled 2019-04-22: qty 5

## 2019-04-22 MED ORDER — SODIUM CHLORIDE 0.9 % IV SOLN
1600.0000 mg | Freq: Once | INTRAVENOUS | Status: AC
  Administered 2019-04-22: 1600 mg via INTRAVENOUS
  Filled 2019-04-22: qty 64

## 2019-04-22 MED ORDER — SODIUM CHLORIDE 0.9 % IV SOLN
140.0000 mg/m2 | Freq: Once | INTRAVENOUS | Status: AC
  Administered 2019-04-22: 318 mg via INTRAVENOUS
  Filled 2019-04-22: qty 53

## 2019-04-22 MED ORDER — SODIUM CHLORIDE 0.9 % IV SOLN
Freq: Once | INTRAVENOUS | Status: AC
  Administered 2019-04-22: 09:00:00 via INTRAVENOUS
  Filled 2019-04-22: qty 250

## 2019-04-22 MED ORDER — SODIUM CHLORIDE 0.9 % IV SOLN
520.0000 mg | Freq: Once | INTRAVENOUS | Status: AC
  Administered 2019-04-22: 520 mg via INTRAVENOUS
  Filled 2019-04-22: qty 52

## 2019-04-22 MED ORDER — SODIUM CHLORIDE 0.9% FLUSH
10.0000 mL | INTRAVENOUS | Status: DC | PRN
  Administered 2019-04-22: 10 mL
  Filled 2019-04-22: qty 10

## 2019-04-22 MED ORDER — PALONOSETRON HCL INJECTION 0.25 MG/5ML
0.2500 mg | Freq: Once | INTRAVENOUS | Status: AC
  Administered 2019-04-22: 0.25 mg via INTRAVENOUS

## 2019-04-22 MED ORDER — SODIUM CHLORIDE 0.9 % IV SOLN
Freq: Once | INTRAVENOUS | Status: AC
  Administered 2019-04-22: 10:00:00 via INTRAVENOUS
  Filled 2019-04-22: qty 5

## 2019-04-22 MED ORDER — DIPHENHYDRAMINE HCL 50 MG/ML IJ SOLN
50.0000 mg | Freq: Once | INTRAMUSCULAR | Status: AC
  Administered 2019-04-22: 50 mg via INTRAVENOUS

## 2019-04-22 MED ORDER — HEPARIN SOD (PORK) LOCK FLUSH 100 UNIT/ML IV SOLN
500.0000 [IU] | Freq: Once | INTRAVENOUS | Status: AC | PRN
  Administered 2019-04-22: 500 [IU]
  Filled 2019-04-22: qty 5

## 2019-04-22 NOTE — Patient Instructions (Signed)
[facility] Discharge Instructions for Patients Receiving Chemotherapy  Today you received the following chemotherapy agents Taxol, Carboplatin  To help prevent nausea and vomiting after your treatment, we encourage you to take your nausea medication as directed  If you develop nausea and vomiting that is not controlled by your nausea medication, call the clinic.   BELOW ARE SYMPTOMS THAT SHOULD BE REPORTED IMMEDIATELY:  *FEVER GREATER THAN 100.5 F  *CHILLS WITH OR WITHOUT FEVER  NAUSEA AND VOMITING THAT IS NOT CONTROLLED WITH YOUR NAUSEA MEDICATION  *UNUSUAL SHORTNESS OF BREATH  *UNUSUAL BRUISING OR BLEEDING  TENDERNESS IN MOUTH AND THROAT WITH OR WITHOUT PRESENCE OF ULCERS  *URINARY PROBLEMS  *BOWEL PROBLEMS  UNUSUAL RASH Items with * indicate a potential emergency and should be followed up as soon as possible.  Feel free to call the clinic should you have any questions or concerns. The clinic phone number is [phone].  Please show the CHEMO ALERT CARD at check-in to the Emergency Department and triage nurse.   

## 2019-05-18 ENCOUNTER — Ambulatory Visit (HOSPITAL_COMMUNITY)
Admission: RE | Admit: 2019-05-18 | Discharge: 2019-05-18 | Disposition: A | Discharge status: Home / Self Care | Payer: Medicare Other | Source: Ambulatory Visit | Attending: Hematology and Oncology | Admitting: Hematology and Oncology

## 2019-05-18 ENCOUNTER — Other Ambulatory Visit: Payer: Self-pay

## 2019-05-18 ENCOUNTER — Other Ambulatory Visit: Payer: Self-pay | Admitting: Hematology and Oncology

## 2019-05-18 DIAGNOSIS — C561 Malignant neoplasm of right ovary: Secondary | ICD-10-CM | POA: Insufficient documentation

## 2019-05-18 MED ORDER — HEPARIN SOD (PORK) LOCK FLUSH 100 UNIT/ML IV SOLN
INTRAVENOUS | Status: AC
  Administered 2019-05-18: 10:00:00 500 [IU] via INTRAVENOUS
  Filled 2019-05-18: qty 5

## 2019-05-18 MED ORDER — IOHEXOL 300 MG/ML  SOLN
30.0000 mL | Freq: Once | INTRAMUSCULAR | Status: AC | PRN
  Administered 2019-05-18: 30 mL via ORAL

## 2019-05-18 MED ORDER — SODIUM CHLORIDE (PF) 0.9 % IJ SOLN
INTRAMUSCULAR | Status: AC
  Filled 2019-05-18: qty 50

## 2019-05-18 MED ORDER — HEPARIN SOD (PORK) LOCK FLUSH 100 UNIT/ML IV SOLN
500.0000 [IU] | Freq: Once | INTRAVENOUS | Status: AC
  Administered 2019-05-18: 10:00:00 500 [IU] via INTRAVENOUS

## 2019-05-18 MED ORDER — IOHEXOL 300 MG/ML  SOLN
100.0000 mL | Freq: Once | INTRAMUSCULAR | Status: AC | PRN
  Administered 2019-05-18: 100 mL via INTRAVENOUS

## 2019-05-20 ENCOUNTER — Encounter: Payer: Self-pay | Admitting: Hematology and Oncology

## 2019-05-20 ENCOUNTER — Other Ambulatory Visit: Payer: Self-pay

## 2019-05-20 ENCOUNTER — Inpatient Hospital Stay (HOSPITAL_BASED_OUTPATIENT_CLINIC_OR_DEPARTMENT_OTHER): Payer: Medicare Other | Admitting: Hematology and Oncology

## 2019-05-20 ENCOUNTER — Inpatient Hospital Stay: Payer: Medicare Other

## 2019-05-20 ENCOUNTER — Telehealth: Payer: Self-pay | Admitting: Hematology and Oncology

## 2019-05-20 ENCOUNTER — Inpatient Hospital Stay: Payer: Medicare Other | Attending: Gynecology

## 2019-05-20 ENCOUNTER — Telehealth: Payer: Self-pay | Admitting: Oncology

## 2019-05-20 VITALS — BP 146/65 | HR 100

## 2019-05-20 DIAGNOSIS — Z90722 Acquired absence of ovaries, bilateral: Secondary | ICD-10-CM | POA: Insufficient documentation

## 2019-05-20 DIAGNOSIS — Z7984 Long term (current) use of oral hypoglycemic drugs: Secondary | ICD-10-CM | POA: Insufficient documentation

## 2019-05-20 DIAGNOSIS — I10 Essential (primary) hypertension: Secondary | ICD-10-CM | POA: Diagnosis not present

## 2019-05-20 DIAGNOSIS — Z90710 Acquired absence of both cervix and uterus: Secondary | ICD-10-CM | POA: Insufficient documentation

## 2019-05-20 DIAGNOSIS — Z79899 Other long term (current) drug therapy: Secondary | ICD-10-CM | POA: Diagnosis not present

## 2019-05-20 DIAGNOSIS — R918 Other nonspecific abnormal finding of lung field: Secondary | ICD-10-CM | POA: Diagnosis not present

## 2019-05-20 DIAGNOSIS — Z7189 Other specified counseling: Secondary | ICD-10-CM

## 2019-05-20 DIAGNOSIS — C561 Malignant neoplasm of right ovary: Secondary | ICD-10-CM

## 2019-05-20 DIAGNOSIS — D6481 Anemia due to antineoplastic chemotherapy: Secondary | ICD-10-CM

## 2019-05-20 DIAGNOSIS — T451X5A Adverse effect of antineoplastic and immunosuppressive drugs, initial encounter: Secondary | ICD-10-CM

## 2019-05-20 DIAGNOSIS — Z9079 Acquired absence of other genital organ(s): Secondary | ICD-10-CM | POA: Insufficient documentation

## 2019-05-20 DIAGNOSIS — Z5112 Encounter for antineoplastic immunotherapy: Secondary | ICD-10-CM | POA: Insufficient documentation

## 2019-05-20 DIAGNOSIS — Z9049 Acquired absence of other specified parts of digestive tract: Secondary | ICD-10-CM | POA: Diagnosis not present

## 2019-05-20 LAB — CBC WITH DIFFERENTIAL (CANCER CENTER ONLY)
Abs Immature Granulocytes: 0.02 10*3/uL (ref 0.00–0.07)
Basophils Absolute: 0 10*3/uL (ref 0.0–0.1)
Basophils Relative: 0 %
Eosinophils Absolute: 0 10*3/uL (ref 0.0–0.5)
Eosinophils Relative: 0 %
HCT: 26.4 % — ABNORMAL LOW (ref 36.0–46.0)
Hemoglobin: 8.4 g/dL — ABNORMAL LOW (ref 12.0–15.0)
Immature Granulocytes: 0 %
Lymphocytes Relative: 12 %
Lymphs Abs: 0.5 10*3/uL — ABNORMAL LOW (ref 0.7–4.0)
MCH: 34.6 pg — ABNORMAL HIGH (ref 26.0–34.0)
MCHC: 31.8 g/dL (ref 30.0–36.0)
MCV: 108.6 fL — ABNORMAL HIGH (ref 80.0–100.0)
Monocytes Absolute: 0.1 10*3/uL (ref 0.1–1.0)
Monocytes Relative: 3 %
Neutro Abs: 3.8 10*3/uL (ref 1.7–7.7)
Neutrophils Relative %: 85 %
Platelet Count: 181 10*3/uL (ref 150–400)
RBC: 2.43 MIL/uL — ABNORMAL LOW (ref 3.87–5.11)
RDW: 17.5 % — ABNORMAL HIGH (ref 11.5–15.5)
WBC Count: 4.5 10*3/uL (ref 4.0–10.5)
nRBC: 0 % (ref 0.0–0.2)

## 2019-05-20 LAB — CMP (CANCER CENTER ONLY)
ALT: 29 U/L (ref 0–44)
AST: 22 U/L (ref 15–41)
Albumin: 3.9 g/dL (ref 3.5–5.0)
Alkaline Phosphatase: 84 U/L (ref 38–126)
Anion gap: 15 (ref 5–15)
BUN: 26 mg/dL — ABNORMAL HIGH (ref 8–23)
CO2: 21 mmol/L — ABNORMAL LOW (ref 22–32)
Calcium: 8.8 mg/dL — ABNORMAL LOW (ref 8.9–10.3)
Chloride: 105 mmol/L (ref 98–111)
Creatinine: 0.93 mg/dL (ref 0.44–1.00)
GFR, Est AFR Am: >60 mL/min (ref >60)
GFR, Estimated: >60 mL/min (ref >60)
Glucose, Bld: 264 mg/dL — ABNORMAL HIGH (ref 70–99)
Potassium: 4.3 mmol/L (ref 3.5–5.1)
Sodium: 141 mmol/L (ref 135–145)
Total Bilirubin: 0.4 mg/dL (ref 0.3–1.2)
Total Protein: 7.8 g/dL (ref 6.5–8.1)

## 2019-05-20 LAB — TOTAL PROTEIN, URINE DIPSTICK: Protein, ur: <30 mg/dL — AB

## 2019-05-20 MED ORDER — SODIUM CHLORIDE 0.9 % IV SOLN
15.0000 mg/kg | Freq: Once | INTRAVENOUS | Status: AC
  Administered 2019-05-20: 11:00:00 1500 mg via INTRAVENOUS
  Filled 2019-05-20: qty 60

## 2019-05-20 MED ORDER — HEPARIN SOD (PORK) LOCK FLUSH 100 UNIT/ML IV SOLN
500.0000 [IU] | Freq: Once | INTRAVENOUS | Status: AC | PRN
  Administered 2019-05-20: 12:00:00 500 [IU]
  Filled 2019-05-20: qty 5

## 2019-05-20 MED ORDER — SODIUM CHLORIDE 0.9% FLUSH
10.0000 mL | INTRAVENOUS | Status: DC | PRN
  Administered 2019-05-20: 12:00:00 10 mL
  Filled 2019-05-20: qty 10

## 2019-05-20 MED ORDER — SODIUM CHLORIDE 0.9 % IV SOLN
Freq: Once | INTRAVENOUS | Status: AC
  Administered 2019-05-20: 10:00:00 via INTRAVENOUS
  Filled 2019-05-20: qty 250

## 2019-05-20 MED ORDER — SODIUM CHLORIDE 0.9% FLUSH
10.0000 mL | Freq: Once | INTRAVENOUS | Status: AC
  Administered 2019-05-20: 10 mL
  Filled 2019-05-20: qty 10

## 2019-05-20 NOTE — Patient Instructions (Signed)
Bevacizumab injection What is this medicine? BEVACIZUMAB (be va SIZ yoo mab) is a monoclonal antibody. It is used to treat many types of cancer. This medicine may be used for other purposes; ask your health care provider or pharmacist if you have questions. COMMON BRAND NAME(S): Avastin, MVASI, Zirabev What should I tell my health care provider before I take this medicine? They need to know if you have any of these conditions:  diabetes  heart disease  high blood pressure  history of coughing up blood  prior anthracycline chemotherapy (e.g., doxorubicin, daunorubicin, epirubicin)  recent or ongoing radiation therapy  recent or planning to have surgery  stroke  an unusual or allergic reaction to bevacizumab, hamster proteins, mouse proteins, other medicines, foods, dyes, or preservatives  pregnant or trying to get pregnant  breast-feeding How should I use this medicine? This medicine is for infusion into a vein. It is given by a health care professional in a hospital or clinic setting. Talk to your pediatrician regarding the use of this medicine in children. Special care may be needed. Overdosage: If you think you have taken too much of this medicine contact a poison control center or emergency room at once. NOTE: This medicine is only for you. Do not share this medicine with others. What if I miss a dose? It is important not to miss your dose. Call your doctor or health care professional if you are unable to keep an appointment. What may interact with this medicine? Interactions are not expected. This list may not describe all possible interactions. Give your health care provider a list of all the medicines, herbs, non-prescription drugs, or dietary supplements you use. Also tell them if you smoke, drink alcohol, or use illegal drugs. Some items may interact with your medicine. What should I watch for while using this medicine? Your condition will be monitored carefully while  you are receiving this medicine. You will need important blood work and urine testing done while you are taking this medicine. This medicine may increase your risk to bruise or bleed. Call your doctor or health care professional if you notice any unusual bleeding. This medicine should be started at least 28 days following major surgery and the site of the surgery should be totally healed. Check with your doctor before scheduling dental work or surgery while you are receiving this treatment. Talk to your doctor if you have recently had surgery or if you have a wound that has not healed. Do not become pregnant while taking this medicine or for 6 months after stopping it. Women should inform their doctor if they wish to become pregnant or think they might be pregnant. There is a potential for serious side effects to an unborn child. Talk to your health care professional or pharmacist for more information. Do not breast-feed an infant while taking this medicine and for 6 months after the last dose. This medicine has caused ovarian failure in some women. This medicine may interfere with the ability to have a child. You should talk to your doctor or health care professional if you are concerned about your fertility. What side effects may I notice from receiving this medicine? Side effects that you should report to your doctor or health care professional as soon as possible:  allergic reactions like skin rash, itching or hives, swelling of the face, lips, or tongue  chest pain or chest tightness  chills  coughing up blood  high fever  seizures  severe constipation  signs and symptoms   of bleeding such as bloody or black, tarry stools; red or dark-brown urine; spitting up blood or brown material that looks like coffee grounds; red spots on the skin; unusual bruising or bleeding from the eye, gums, or nose  signs and symptoms of a blood clot such as breathing problems; chest pain; severe, sudden  headache; pain, swelling, warmth in the leg  signs and symptoms of a stroke like changes in vision; confusion; trouble speaking or understanding; severe headaches; sudden numbness or weakness of the face, arm or leg; trouble walking; dizziness; loss of balance or coordination  stomach pain  sweating  swelling of legs or ankles  vomiting  weight gain Side effects that usually do not require medical attention (report to your doctor or health care professional if they continue or are bothersome):  back pain  changes in taste  decreased appetite  dry skin  nausea  tiredness This list may not describe all possible side effects. Call your doctor for medical advice about side effects. You may report side effects to FDA at 1-800-FDA-1088. Where should I keep my medicine? This drug is given in a hospital or clinic and will not be stored at home. NOTE: This sheet is a summary. It may not cover all possible information. If you have questions about this medicine, talk to your doctor, pharmacist, or health care provider.  2020 [employer] Standard (2016-06-20 14:33:29)  

## 2019-05-20 NOTE — Assessment & Plan Note (Signed)
We had numerous goals of care discussions in the past The patient is satisfied with the plan to continue on maintenance bevacizumab

## 2019-05-20 NOTE — Assessment & Plan Note (Signed)
Her blood pressure monitoring at home is satisfactory Her mild elevated blood pressure today is likely due to mild anxiety We will proceed with bevacizumab without delay

## 2019-05-20 NOTE — Telephone Encounter (Signed)
Scheduled appt per 11/13 sch message - pt is aware of appt date and time

## 2019-05-20 NOTE — Assessment & Plan Note (Signed)
She have stable anemia since last cycle of chemotherapy I anticipate her anemia will improve after discontinuation of chemotherapy She does not need transfusion support

## 2019-05-20 NOTE — Telephone Encounter (Signed)
Called Cheryl Melton and advised her that a family member can attend her appointment today with Dr. [name].  [place] said her husband will attend.  Discussed that he will not be able to go the blue room or infusion with her.  She verbalized agreement.

## 2019-05-20 NOTE — Assessment & Plan Note (Signed)
I have reviewed multiple imaging studies with the patient and her husband Overall, the patient had complete response to therapy We will discontinue carboplatin and Taxol We will proceed with maintenance bevacizumab only We will continue close monitoring of tumor marker So far, she tolerated treatment well except for mild intermittent proteinuria

## 2019-05-20 NOTE — Progress Notes (Signed)
[facility] OFFICE PROGRESS NOTE  Patient Care Team: Jobe, Daniel B., MD as PCP - General (Internal Medicine)  ASSESSMENT & PLAN:  Ovarian cancer on right (HCC) I have reviewed multiple imaging studies with the patient and her husband Overall, the patient had complete response to therapy We will discontinue carboplatin and Taxol We will proceed with maintenance bevacizumab only We will continue close monitoring of tumor marker So far, she tolerated treatment well except for mild intermittent proteinuria  Anemia due to antineoplastic chemotherapy She have stable anemia since last cycle of chemotherapy I anticipate her anemia will improve after discontinuation of chemotherapy She does not need transfusion support  Essential hypertension Her blood pressure monitoring at home is satisfactory Her mild elevated blood pressure today is likely due to mild anxiety We will proceed with bevacizumab without delay  Goals of care, counseling/discussion We had numerous goals of care discussions in the past The patient is satisfied with the plan to continue on maintenance bevacizumab   No orders of the defined types were placed in this encounter.   INTERVAL HISTORY: Please see below for problem oriented charting. 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    03/20/2017 Imaging   GYN ultrasound was performed this revealed a small fundus measuring 5 cm there are 2 small fibroids within the fundus with the largest measuring 1.3 cm Within the endometrium there is a small amount a fluid the endometrial wall was slightly thickened at 4.8 mm the ultrasound was consistent with a small endometrial polyp  The right adnexa measured 7.4 x 6.7 cm contained a both a cystic and solid mass with a small amount of shadowing This is consistent with a dermoid tumor the left ovary was 2.6 x 1.5 x 1.0 cm There is no other adnexal masses no free fluid within the abdomen no ascites     04/10/2017 Surgery   Operation: Operative Laparscopy with  Peritoneal biopsy 2 hysteroscopy D&C Surgeon: Douglas K Louk MD  Specimens: Peritoneal biopsy and cellular washings  Complications: None FIndings Small amount of ascites was found cellular washings were sent There was peritoneal studding And omental caking Even in steep Trendelenburg the ovaries and uterus were unable to be visualized due to the small intestines in the lower pelvis     04/10/2017 Pathology Results   Biopsy from [facility] was nondiagnostic.  Peritoneum biopsy did not reveal malignancy.  Endometrial biopsy also did not reveal malignancy.   04/13/2017 Tumor Marker   Patient's tumor was tested for the following markers: CA-125 Results of the tumor marker test revealed 324.1   04/20/2017 Tumor Marker   Patient's tumor was tested for the following markers: CA-125 Results of the tumor marker test revealed   345.6   04/22/2017 Pathology Results   Peritoneum, biopsy - CARCINOMA. - SEE COMMENT.   04/22/2017 Procedure   CT-guided core biopsy performed of peritoneal tumor in the left lower quadrant.    04/24/2017 - 09/25/2017 Chemotherapy   She received carboplatin and Taxol x 3 cycles followed by interval surgical debulking on 12/31. Chemotherapy was subsequently resumed for 3 more cycles   06/05/2017 Tumor Marker   Patient's tumor was tested for the following markers: CA-125 Results of the tumor marker test revealed 27.5   06/17/2017 Imaging   1. Considerable improvement in the prior omental caking, which now presents mainly as an indistinct stranding with slight nodularity along the left upper quadrant omentum. 2. Stable appearance of right ovarian mass with fatty and calcific elements classic for a dermoid. 3. There is accentuated enhancement in the walls of the common hepatic duct and common bile duct. A low-grade cholangitis is not readily excluded. 4. Other imaging findings of potential clinical significance: Mild cardiomegaly. Aortic Atherosclerosis (ICD10-I70.0). Lumbar scoliosis, spondylosis, degenerative disc disease, and pars defects at L5. These cause impingement at L5-S1 and lesser impingement at L3-4 and L4-5. 5. 65% compression fracture at L1 slightly worsened than on the prior exam, with associated vertebral sclerosis and posterior retropulsion.   07/06/2017 Surgery   Bilateral Salpingo-Oophorectomy W/Omentectomy, Total Abd Hysterectomy & Radical Dissection For Debulking Right - Ureterolysis, With Or Without Repositioning Of Ureter For Retroperitoneal Fibrosis   07/06/2017 Pathology Results   A: Omentum, omentectomy - Omentum with minimal residual carcinoma, consistent with high grade serous carcinoma (microscopic; stage ypT3a) - Extensive treatment effect with calcifications, fibrosis, histiocytes, and giant cell response - CRS score 3  B: Uterus with cervix and left ovary and fallopian tube, hysterectomy and left salpingo-oophorectomy - Cervix: Ectocervix and endocervix with no dysplasia or malignancy identified - Endometrium: Inactive with no hyperplasia, atypia, or  malignancy identified - Myometrium: Adenomyosis and benign leiomyoma, size 1.2 cm - Serosa: Focal calcification and histiocytes suggestive of treatment effect, with no viable carcinoma identified - Ovary, left: Physiologic changes and no carcinoma identified - Fallopian tube, left: 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 and no malignancy identified  C: Ovary and fallopian tube, right, salpingo-oophorectomy - Right fallopian tube with focal residual carcinoma, consistent with high grade serous carcinoma, and serous tubal intraepithelial carcinoma (STIC) - Associated calcifications and histiocytes suggestive of treatment response - Right ovary with calcifications and histiocytes suggestive of treatment effect, with no definite viable carcinoma identified - Mature cystic teratoma, 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 - See synoptic report and comment   08/14/2017 Tumor Marker   Patient's tumor was tested for the following markers: CA-125 Results of the tumor marker test revealed 14.6   09/04/2017 Tumor Marker   Patient's tumor was tested for the following markers: CA-125 Results of the tumor marker test revealed 12.5   10/03/2017 Genetic Testing   The Common Hereditary Cancer Panel offered by [facility] includes sequencing and/or deletion duplication testing of the following 47 genes: APC, ATM, AXIN2, BARD1, BMPR1A, BRCA1, BRCA2, BRIP1, CDH1, CDKN2A (p14ARF), CDKN2A (p16INK4a), CKD4, CHEK2, CTNNA1, DICER1, EPCAM (Deletion/duplication testing only), GREM1 (promoter region deletion/duplication testing only), KIT, MEN1, MLH1, MSH2, MSH3, MSH6, MUTYH, NBN, NF1, NHTL1, PALB2, PDGFRA, PMS2, POLD1, POLE, PTEN, RAD50, RAD51C, RAD51D, SDHB, SDHC, SDHD, SMAD4, SMARCA4. STK11, TP53, TSC1, TSC2, and VHL.  The following genes were evaluated for sequence changes only: SDHA and HOXB13 c.251G>A variant only.  Results: Negative, no pathogenic variants identified.  The date of this test report is  10/03/2017.    10/28/2017 Imaging   1.  Stable pulmonary nodules. No new or progressive findings. 2. No residual or recurrent omental disease is identified. No obvious peritoneal surface disease. 3. Increasing soft tissue density and ill-defined interstitial changes around the abdominal wall surgical incision. This may be progressive postoperative changes or granulation tissue but attention on future scans to exclude the possibility of tumor. 4. Status post resection of right adnexal mass. No pelvic mass or pelvic adenopathy. 5. Stable T12 compression fracture.   01/25/2018 Tumor Marker   Patient's tumor was tested for the following markers: CA-125 Results of the tumor marker test revealed 9.2   12/10/2018 Tumor Marker   Patient's tumor was tested for the following markers: CA-125 Results of the tumor marker test revealed 212   12/22/2018 Imaging   1. Interval development of multiple soft tissue lesions in the gastrocolic ligament, mesentery, and along the peritoneal surface. These soft tissue lesions measure up to 6.5 cm in size. Associated interval development of moderate volume ascites in the abdomen and pelvis. Imaging features are consistent with metastatic disease. 2. Soft tissue projects through the rectus sheath in the right paraumbilical region, likely herniated small bowel as there is a large amount of decompressed small bowel does deep to this finding. However, metastatic deposit at this location is also possibility.  3. Trace pneumobilia in the left liver suggests prior sphincterotomy. 4.  Aortic Atherosclerois (ICD10-170.0)   12/30/2018 Procedure   Successful ultrasound-guided therapeutic paracentesis yielding 4.5 liters of peritoneal fluid.   12/30/2018 Procedure   Technically successful right IJ power-injectable port catheter placement. Ready for routine use.   12/31/2018 -  Chemotherapy   The patient had carboplatin and taxol for chemotherapy treatment.     01/21/2019 Tumor Marker   Patient's tumor was tested for  the following markers: CA-125 Results of the tumor marker test revealed 408   02/11/2019 Tumor Marker   Patient's tumor was tested for the following markers: CA-125 Results of the tumor marker test revealed 646.   03/02/2019 Imaging   1. Marked improvement in peritoneal carcinomatosis/ascites, without complete resolution. 2.  Aortic atherosclerosis (ICD10-170.0).     03/25/2019 Tumor Marker   Patient's tumor was tested for the following markers: CA-125 Results of the tumor marker test revealed 24.1.   04/15/2019 Tumor Marker   Patient's tumor was tested for the following markers: CA-125 Results of the tumor marker test revealed 15.3   05/18/2019 Imaging   1. There is progressive, nearly complete resolution of peritoneal and omental nodularity seen on prior examination with very faint residual peritoneal and omental haziness (series 2, image 50, 39).   2. Interval resolution of ascites.   3.  Status post hysterectomy and oophorectomy.   4.  Coronary artery disease.  Aortic Atherosclerosis (ICD10-I70.0).       REVIEW OF SYSTEMS:   Constitutional: Denies fevers, chills or abnormal weight loss Eyes: Denies blurriness of vision Ears, nose, mouth, throat, and face: Denies mucositis or sore throat Respiratory: Denies cough, dyspnea or wheezes Cardiovascular: Denies palpitation, chest discomfort or lower extremity swelling Gastrointestinal:  Denies nausea, heartburn or change in bowel habits Skin: Denies abnormal skin rashes Lymphatics: Denies new lymphadenopathy or easy bruising Neurological:Denies numbness, tingling or new weaknesses Behavioral/Psych: Mood is stable, no new changes  All other systems were reviewed with the patient and are negative.  I have reviewed the past medical history, past surgical history, social history and family history with the patient and they are unchanged from  previous note.  ALLERGIES:  is allergic to aspirin; nsaids; and tramadol.  MEDICATIONS:   Current Outpatient Medications  Medication Sig Dispense Refill  . acetaminophen (TYLENOL) 650 MG CR tablet Take 1,300 mg by mouth every 8 (eight) hours.     Marland Kitchen alendronate (FOSAMAX) 70 MG tablet Take 70 mg by mouth every Sunday.     Marland Kitchen atorvastatin (LIPITOR) 80 MG tablet Take 40 mg by mouth at bedtime.     . Cholecalciferol (VITAMIN D3) 2000 units TABS Take 2,000 Units by mouth daily.    . citalopram (CELEXA) 10 MG tablet Take 10 mg by mouth at bedtime    . ferrous sulfate 325 (65 FE) MG tablet Take 325 mg by mouth daily. 3 hours after meal and 30 min before a meal    . gabapentin (NEURONTIN) 300 MG capsule Take by mouth.    . Glucosamine Sulfate 500 MG TABS Take 500 mg by mouth 2 (two) times daily.     . hydrochlorothiazide (HYDRODIURIL) 25 MG tablet Take 1 tablet (25 mg total) by mouth daily. 90 tablet 3  . lidocaine-prilocaine (EMLA) cream Apply to affected area once 30 g 3  . loratadine (CLARITIN) 10 MG tablet Take 10 mg by mouth at bedtime.     Marland Kitchen losartan (COZAAR) 25 MG tablet Take 25 mg by mouth daily.     . meclizine (ANTIVERT) 25 MG tablet Take 25 mg by mouth 2 (two) times daily as needed for dizziness.     . metFORMIN (GLUCOPHAGE) 1000 MG tablet Take 1,000 mg by mouth 2 (two) times daily with a meal.     . omeprazole (PRILOSEC) 40 MG capsule Take 40 mg by mouth daily.     . ondansetron (ZOFRAN) 8 MG tablet Take 1 tablet (8 mg total) by mouth every 8 (eight) hours as needed for refractory nausea / vomiting. 30 tablet 1  . pioglitazone (ACTOS) 15 MG tablet Take 15 mg by mouth at bedtime.     . prochlorperazine (COMPAZINE) 10 MG tablet Take 1 tablet (10 mg total) by mouth every 6 (six) hours as needed (Nausea or vomiting). 30 tablet 1  . Pyridoxine HCl (VITAMIN B-6 PO) Take 50 mg by mouth 1 day or 1 dose.    . vitamin B-12 (CYANOCOBALAMIN) 1000 MCG tablet Take 1,000 mcg by mouth daily.      No current facility-administered medications for this visit.    Facility-Administered  Medications Ordered in Other Visits  Medication Dose Route Frequency Provider Last Rate Last Dose  . bevacizumab-bvzr (ZIRABEV) 1,500 mg in sodium chloride 0.9 % 100 mL chemo infusion  15 mg/kg (Treatment Plan Recorded) Intravenous Once Emilyrose Darrah, MD      . sodium chloride flush (NS) 0.9 % injection 10 mL  10 mL Intracatheter Once [name], Anadalay Macdonell, MD      . sodium chloride flush (NS) 0.9 % injection 10 mL  10 mL Intracatheter PRN [name], Deandrew Hoecker, MD        PHYSICAL EXAMINATION: ECOG PERFORMANCE STATUS: 1 - Symptomatic but completely ambulatory  Vitals:   05/20/19 0941  BP: (!) 151/65  Pulse: (!) 102  Resp: 18  Temp: 98.3 F (36.8 C)  SpO2: 99%   Filed Weights   05/20/19 0941  Weight: 224 lb 1.6 oz (101.7 kg)    GENERAL:alert, no distress and comfortable Musculoskeletal:no cyanosis of digits and no clubbing  NEURO: alert & oriented x 3 with fluent speech, no focal motor/sensory deficits  LABORATORY DATA:  I have reviewed  the data as listed    Component Value Date/Time   NA 141 05/20/2019 0935   NA 139 06/05/2017 0746   K 4.3 05/20/2019 0935   K 4.1 06/05/2017 0746   CL 105 05/20/2019 0935   CO2 21 (L) 05/20/2019 0935   CO2 21 (L) 06/05/2017 0746   GLUCOSE 264 (H) 05/20/2019 0935   GLUCOSE 279 (H) 06/05/2017 0746   BUN 26 (H) 05/20/2019 0935   BUN 15.0 06/05/2017 0746   CREATININE 0.93 05/20/2019 0935   CREATININE 0.9 06/05/2017 0746   CALCIUM 8.8 (L) 05/20/2019 0935   CALCIUM 10.1 06/05/2017 0746   PROT 7.8 05/20/2019 0935   PROT 8.1 06/05/2017 0746   ALBUMIN 3.9 05/20/2019 0935   ALBUMIN 4.0 06/05/2017 0746   AST 22 05/20/2019 0935   AST 22 06/05/2017 0746   ALT 29 05/20/2019 0935   ALT 36 06/05/2017 0746   ALKPHOS 84 05/20/2019 0935   ALKPHOS 98 06/05/2017 0746   BILITOT 0.4 05/20/2019 0935   BILITOT 0.44 06/05/2017 0746   GFRNONAA >60 05/20/2019 0935   GFRAA >60 05/20/2019 0935    No results found for: SPEP, UPEP  Lab Results  Component Value Date   WBC  4.5 05/20/2019   NEUTROABS 3.8 05/20/2019   HGB 8.4 (L) 05/20/2019   HCT 26.4 (L) 05/20/2019   MCV 108.6 (H) 05/20/2019   PLT 181 05/20/2019      Chemistry      Component Value Date/Time   NA 141 05/20/2019 0935   NA 139 06/05/2017 0746   K 4.3 05/20/2019 0935   K 4.1 06/05/2017 0746   CL 105 05/20/2019 0935   CO2 21 (L) 05/20/2019 0935   CO2 21 (L) 06/05/2017 0746   BUN 26 (H) 05/20/2019 0935   BUN 15.0 06/05/2017 0746   CREATININE 0.93 05/20/2019 0935   CREATININE 0.9 06/05/2017 0746      Component Value Date/Time   CALCIUM 8.8 (L) 05/20/2019 0935   CALCIUM 10.1 06/05/2017 0746   ALKPHOS 84 05/20/2019 0935   ALKPHOS 98 06/05/2017 0746   AST 22 05/20/2019 0935   AST 22 06/05/2017 0746   ALT 29 05/20/2019 0935   ALT 36 06/05/2017 0746   BILITOT 0.4 05/20/2019 0935   BILITOT 0.44 06/05/2017 0746       RADIOGRAPHIC STUDIES: I have personally reviewed the radiological images as listed and agreed with the findings in the report. Ct Abdomen Pelvis W Contrast  Result Date: 05/18/2019 CLINICAL DATA:  Ovarian cancer recurrence,, assess treatment response EXAM: CT ABDOMEN AND PELVIS WITH CONTRAST TECHNIQUE: Multidetector CT imaging of the abdomen and pelvis was performed using the standard protocol following bolus administration of intravenous contrast. CONTRAST:  100mL OMNIPAQUE IOHEXOL 300 MG/ML SOLN, 30mL OMNIPAQUE IOHEXOL 300 MG/ML SOLN, additional oral enteric contrast COMPARISON:  03/02/2019, 06/17/2017, 04/22/2017 FINDINGS: Lower chest: No acute abnormality. Stable, partially imaged 6 mm pulmonary nodule of the anterior right middle lobe (series 6, image 1). Coronary artery calcifications. Hepatobiliary: No focal liver abnormality is seen. Status post cholecystectomy. No biliary dilatation. Pancreas: Fatty atrophy of the pancreatic parenchyma. No pancreatic ductal dilatation or surrounding inflammatory changes. Spleen: Normal in size without significant abnormality.  Adrenals/Urinary Tract: Adrenal glands are unremarkable. Kidneys are normal, without renal calculi, solid lesion, or hydronephrosis. Bladder is unremarkable. Stomach/Bowel: Stomach is within normal limits. Appendix appears normal. No evidence of bowel wall thickening, distention, or inflammatory changes. Occasional sigmoid diverticula Vascular/Lymphatic: Aortic atherosclerosis. No enlarged abdominal or pelvic lymph nodes. Reproductive:   Status post hysterectomy and oophorectomy. Other: No abdominal wall hernia or abnormality. Interval resolution of ascites. There is progressive, nearly complete resolution of peritoneal and omental nodularity seen on prior examination with very faint residual peritoneal and omental haziness (series 2, image 50, 39). Musculoskeletal: No acute or significant osseous findings. Unchanged inferior endplate wedge deformity of the T12 vertebral body. IMPRESSION: 1. There is progressive, nearly complete resolution of peritoneal and omental nodularity seen on prior examination with very faint residual peritoneal and omental haziness (series 2, image 50, 39). 2. Interval resolution of ascites. 3.  Status post hysterectomy and oophorectomy. 4.  Coronary artery disease.  Aortic Atherosclerosis (ICD10-I70.0). Electronically Signed   By: Alex  Bibbey M.D.   On: 05/18/2019 11:52    All questions were answered. The patient knows to call the clinic with any problems, questions or concerns. No barriers to learning was detected.  I spent 25 minutes counseling the patient face to face. The total time spent in the appointment was 30 minutes and more than 50% was on counseling and review of test results  Ni Gorsuch, MD 05/20/2019 10:36 AM  

## 2019-05-21 LAB — CA 125: Cancer Antigen (CA) 125: 11.2 U/mL (ref 0.0–38.1)

## 2019-05-23 ENCOUNTER — Telehealth: Payer: Self-pay

## 2019-05-23 NOTE — Telephone Encounter (Signed)
-----   Message from Heath Lark, MD sent at 05/21/2019 12:41 PM EST ----- Regarding: pls call and let her know CA-125 is better  ----- Message ----- From: Interface, Lab In [place] Sent: 05/20/2019   8:48 AM EST To: Heath Lark, MD

## 2019-05-23 NOTE — Telephone Encounter (Signed)
Called and given below message. She verbalized understanding. 

## 2019-06-10 ENCOUNTER — Inpatient Hospital Stay (HOSPITAL_BASED_OUTPATIENT_CLINIC_OR_DEPARTMENT_OTHER): Payer: Medicare Other | Admitting: Hematology and Oncology

## 2019-06-10 ENCOUNTER — Other Ambulatory Visit: Payer: Self-pay

## 2019-06-10 ENCOUNTER — Inpatient Hospital Stay: Payer: Medicare Other

## 2019-06-10 ENCOUNTER — Encounter: Payer: Self-pay | Admitting: Hematology and Oncology

## 2019-06-10 ENCOUNTER — Inpatient Hospital Stay: Payer: Medicare Other | Attending: Gynecology

## 2019-06-10 DIAGNOSIS — D6481 Anemia due to antineoplastic chemotherapy: Secondary | ICD-10-CM | POA: Insufficient documentation

## 2019-06-10 DIAGNOSIS — Z5112 Encounter for antineoplastic immunotherapy: Secondary | ICD-10-CM | POA: Diagnosis present

## 2019-06-10 DIAGNOSIS — C561 Malignant neoplasm of right ovary: Secondary | ICD-10-CM | POA: Insufficient documentation

## 2019-06-10 DIAGNOSIS — Z7189 Other specified counseling: Secondary | ICD-10-CM

## 2019-06-10 DIAGNOSIS — T451X5A Adverse effect of antineoplastic and immunosuppressive drugs, initial encounter: Secondary | ICD-10-CM

## 2019-06-10 DIAGNOSIS — I10 Essential (primary) hypertension: Secondary | ICD-10-CM

## 2019-06-10 DIAGNOSIS — R809 Proteinuria, unspecified: Secondary | ICD-10-CM | POA: Diagnosis not present

## 2019-06-10 LAB — CBC WITH DIFFERENTIAL (CANCER CENTER ONLY)
Abs Immature Granulocytes: 0 10*3/uL (ref 0.00–0.07)
Basophils Absolute: 0 10*3/uL (ref 0.0–0.1)
Basophils Relative: 1 %
Eosinophils Absolute: 0.1 10*3/uL (ref 0.0–0.5)
Eosinophils Relative: 2 %
HCT: 28 % — ABNORMAL LOW (ref 36.0–46.0)
Hemoglobin: 8.8 g/dL — ABNORMAL LOW (ref 12.0–15.0)
Immature Granulocytes: 0 %
Lymphocytes Relative: 23 %
Lymphs Abs: 0.9 10*3/uL (ref 0.7–4.0)
MCH: 34.4 pg — ABNORMAL HIGH (ref 26.0–34.0)
MCHC: 31.4 g/dL (ref 30.0–36.0)
MCV: 109.4 fL — ABNORMAL HIGH (ref 80.0–100.0)
Monocytes Absolute: 0.3 10*3/uL (ref 0.1–1.0)
Monocytes Relative: 8 %
Neutro Abs: 2.7 10*3/uL (ref 1.7–7.7)
Neutrophils Relative %: 66 %
Platelet Count: 221 10*3/uL (ref 150–400)
RBC: 2.56 MIL/uL — ABNORMAL LOW (ref 3.87–5.11)
RDW: 15.7 % — ABNORMAL HIGH (ref 11.5–15.5)
WBC Count: 4 10*3/uL (ref 4.0–10.5)
nRBC: 0 % (ref 0.0–0.2)

## 2019-06-10 LAB — CMP (CANCER CENTER ONLY)
ALT: 26 U/L (ref 0–44)
AST: 29 U/L (ref 15–41)
Albumin: 3.8 g/dL (ref 3.5–5.0)
Alkaline Phosphatase: 71 U/L (ref 38–126)
Anion gap: 12 (ref 5–15)
BUN: 20 mg/dL (ref 8–23)
CO2: 25 mmol/L (ref 22–32)
Calcium: 8.8 mg/dL — ABNORMAL LOW (ref 8.9–10.3)
Chloride: 104 mmol/L (ref 98–111)
Creatinine: 0.93 mg/dL (ref 0.44–1.00)
GFR, Est AFR Am: >60 mL/min (ref >60)
GFR, Estimated: >60 mL/min (ref >60)
Glucose, Bld: 184 mg/dL — ABNORMAL HIGH (ref 70–99)
Potassium: 3.9 mmol/L (ref 3.5–5.1)
Sodium: 141 mmol/L (ref 135–145)
Total Bilirubin: 0.4 mg/dL (ref 0.3–1.2)
Total Protein: 7.3 g/dL (ref 6.5–8.1)

## 2019-06-10 LAB — TOTAL PROTEIN, URINE DIPSTICK: Protein, ur: NEGATIVE mg/dL

## 2019-06-10 MED ORDER — SODIUM CHLORIDE 0.9% FLUSH
10.0000 mL | Freq: Once | INTRAVENOUS | Status: AC
  Administered 2019-06-10: 10 mL
  Filled 2019-06-10: qty 10

## 2019-06-10 MED ORDER — SODIUM CHLORIDE 0.9 % IV SOLN
Freq: Once | INTRAVENOUS | Status: AC
  Administered 2019-06-10: 10:00:00 via INTRAVENOUS
  Filled 2019-06-10: qty 250

## 2019-06-10 MED ORDER — SODIUM CHLORIDE 0.9 % IV SOLN
15.0000 mg/kg | Freq: Once | INTRAVENOUS | Status: AC
  Administered 2019-06-10: 1500 mg via INTRAVENOUS
  Filled 2019-06-10: qty 48

## 2019-06-10 MED ORDER — SODIUM CHLORIDE 0.9% FLUSH
10.0000 mL | INTRAVENOUS | Status: DC | PRN
  Administered 2019-06-10: 10 mL
  Filled 2019-06-10: qty 10

## 2019-06-10 MED ORDER — HEPARIN SOD (PORK) LOCK FLUSH 100 UNIT/ML IV SOLN
500.0000 [IU] | Freq: Once | INTRAVENOUS | Status: AC | PRN
  Administered 2019-06-10: 500 [IU]
  Filled 2019-06-10: qty 5

## 2019-06-10 NOTE — Assessment & Plan Note (Signed)
Her blood pressure is well controlled She has no proteinuria today We will continue aggressive blood pressure monitoring and medical management

## 2019-06-10 NOTE — Patient Instructions (Signed)
Bevacizumab injection What is this medicine? BEVACIZUMAB (be va SIZ yoo mab) is a monoclonal antibody. It is used to treat many types of cancer. This medicine may be used for other purposes; ask your health care provider or pharmacist if you have questions. COMMON BRAND NAME(S): Avastin, MVASI, Zirabev What should I tell my health care provider before I take this medicine? They need to know if you have any of these conditions:  diabetes  heart disease  high blood pressure  history of coughing up blood  prior anthracycline chemotherapy (e.g., doxorubicin, daunorubicin, epirubicin)  recent or ongoing radiation therapy  recent or planning to have surgery  stroke  an unusual or allergic reaction to bevacizumab, hamster proteins, mouse proteins, other medicines, foods, dyes, or preservatives  pregnant or trying to get pregnant  breast-feeding How should I use this medicine? This medicine is for infusion into a vein. It is given by a health care professional in a hospital or clinic setting. Talk to your pediatrician regarding the use of this medicine in children. Special care may be needed. Overdosage: If you think you have taken too much of this medicine contact a poison control center or emergency room at once. NOTE: This medicine is only for you. Do not share this medicine with others. What if I miss a dose? It is important not to miss your dose. Call your doctor or health care professional if you are unable to keep an appointment. What may interact with this medicine? Interactions are not expected. This list may not describe all possible interactions. Give your health care provider a list of all the medicines, herbs, non-prescription drugs, or dietary supplements you use. Also tell them if you smoke, drink alcohol, or use illegal drugs. Some items may interact with your medicine. What should I watch for while using this medicine? Your condition will be monitored carefully while  you are receiving this medicine. You will need important blood work and urine testing done while you are taking this medicine. This medicine may increase your risk to bruise or bleed. Call your doctor or health care professional if you notice any unusual bleeding. This medicine should be started at least 28 days following major surgery and the site of the surgery should be totally healed. Check with your doctor before scheduling dental work or surgery while you are receiving this treatment. Talk to your doctor if you have recently had surgery or if you have a wound that has not healed. Do not become pregnant while taking this medicine or for 6 months after stopping it. Women should inform their doctor if they wish to become pregnant or think they might be pregnant. There is a potential for serious side effects to an unborn child. Talk to your health care professional or pharmacist for more information. Do not breast-feed an infant while taking this medicine and for 6 months after the last dose. This medicine has caused ovarian failure in some women. This medicine may interfere with the ability to have a child. You should talk to your doctor or health care professional if you are concerned about your fertility. What side effects may I notice from receiving this medicine? Side effects that you should report to your doctor or health care professional as soon as possible:  allergic reactions like skin rash, itching or hives, swelling of the face, lips, or tongue  chest pain or chest tightness  chills  coughing up blood  high fever  seizures  severe constipation  signs and symptoms   of bleeding such as bloody or black, tarry stools; red or dark-brown urine; spitting up blood or brown material that looks like coffee grounds; red spots on the skin; unusual bruising or bleeding from the eye, gums, or nose  signs and symptoms of a blood clot such as breathing problems; chest pain; severe, sudden  headache; pain, swelling, warmth in the leg  signs and symptoms of a stroke like changes in vision; confusion; trouble speaking or understanding; severe headaches; sudden numbness or weakness of the face, arm or leg; trouble walking; dizziness; loss of balance or coordination  stomach pain  sweating  swelling of legs or ankles  vomiting  weight gain Side effects that usually do not require medical attention (report to your doctor or health care professional if they continue or are bothersome):  back pain  changes in taste  decreased appetite  dry skin  nausea  tiredness This list may not describe all possible side effects. Call your doctor for medical advice about side effects. You may report side effects to FDA at 1-800-FDA-1088. Where should I keep my medicine? This drug is given in a hospital or clinic and will not be stored at home. NOTE: This sheet is a summary. It may not cover all possible information. If you have questions about this medicine, talk to your doctor, pharmacist, or health care provider.  2020 [employer] Standard (2016-06-20 14:33:29)  

## 2019-06-10 NOTE — Progress Notes (Signed)
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 [facility]) Her last imaging study showed the patient had complete response to therapy We will proceed with maintenance bevacizumab only We will continue close monitoring of tumor marker So far, she tolerated treatment well except for mild intermittent proteinuria  Anemia due to antineoplastic chemotherapy Her anemia has improved since discontinuation of chemotherapy We will observe closely I anticipate it will continue to improve in the future  Essential hypertension Her blood pressure is well controlled She has no proteinuria today We will continue aggressive blood pressure monitoring and medical management   No orders of the defined types were placed in this encounter.   INTERVAL HISTORY: Please see below for problem oriented charting. 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    03/20/2017 Imaging   GYN ultrasound was performed this revealed a small fundus measuring 5 cm there are 2 small fibroids within the fundus with the largest measuring 1.3 cm Within the endometrium there is a small amount a fluid the endometrial wall was slightly thickened at 4.8 mm the ultrasound was consistent with a small endometrial polyp  The right  adnexa measured 7.4 x 6.7 cm contained a both a cystic and solid mass with a small amount of shadowing This is consistent with a dermoid tumor the left ovary was 2.6 x 1.5 x 1.0 cm There is no other adnexal masses no free fluid within the abdomen no ascites     04/10/2017 Surgery   Operation: Operative Laparscopy with  Peritoneal biopsy 2 hysteroscopy D&C Surgeon: Lynder Parents MD  Specimens: Peritoneal biopsy and cellular washings  Complications: None FIndings Small amount of ascites was found cellular washings were sent There was peritoneal studding And omental caking Even in steep Trendelenburg the ovaries and uterus were unable to be visualized due to the small intestines in the lower pelvis     04/10/2017 Pathology Results   Biopsy from [facility] was nondiagnostic.  Peritoneum biopsy did not reveal malignancy.  Endometrial biopsy also did not reveal malignancy.   04/13/2017 Tumor Marker   Patient's tumor was tested for the following markers: CA-125 Results of the tumor marker test revealed 324.1   04/20/2017 Tumor Marker   Patient's tumor was tested for the following markers: CA-125 Results of the tumor marker test revealed 345.6   04/22/2017 Pathology Results   Peritoneum, biopsy - CARCINOMA. - SEE COMMENT.   04/22/2017 Procedure   CT-guided core biopsy performed of peritoneal tumor in the left lower quadrant.   04/24/2017 - 09/25/2017 Chemotherapy   She received carboplatin and Taxol x 3 cycles followed by interval surgical debulking on 12/31. Chemotherapy was subsequently resumed for 3 more cycles   06/05/2017 Tumor Marker   Patient's tumor was tested for the following markers: CA-125 Results  of the tumor marker test revealed 27.5   06/17/2017 Imaging   1. Considerable improvement in the prior omental caking, which now presents mainly as an indistinct stranding with slight nodularity along the left upper quadrant omentum. 2. Stable  appearance of right ovarian mass with fatty and calcific elements classic for a dermoid. 3. There is accentuated enhancement in the walls of the common hepatic duct and common bile duct. A low-grade cholangitis is not readily excluded. 4. Other imaging findings of potential clinical significance: Mild cardiomegaly. Aortic Atherosclerosis (ICD10-I70.0). Lumbar scoliosis, spondylosis, degenerative disc disease, and pars defects at L5. These cause impingement at L5-S1 and lesser impingement at L3-4 and L4-5. 5. 65% compression fracture at L1 slightly worsened than on the prior exam, with associated vertebral sclerosis and posterior retropulsion.   07/06/2017 Surgery   Bilateral Salpingo-Oophorectomy W/Omentectomy, Total Abd Hysterectomy & Radical Dissection For Debulking Right - Ureterolysis, With Or Without Repositioning Of Ureter For Retroperitoneal Fibrosis   07/06/2017 Pathology Results   A: Omentum, omentectomy - Omentum with minimal residual carcinoma, consistent with high grade serous carcinoma (microscopic; stage ypT3a) - Extensive treatment effect with calcifications, fibrosis, histiocytes, and giant cell response - CRS score 3  B: Uterus with cervix and left ovary and fallopian tube, hysterectomy and left salpingo-oophorectomy - Cervix: Ectocervix and endocervix with no dysplasia or malignancy identified - Endometrium: Inactive with no hyperplasia, atypia, or malignancy identified - Myometrium: Adenomyosis and benign leiomyoma, size 1.2 cm - Serosa: Focal calcification and histiocytes suggestive of treatment effect, with no viable carcinoma identified - Ovary, left: Physiologic changes and no carcinoma identified - Fallopian tube, left: 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 and no malignancy identified  C: Ovary and fallopian tube, right, salpingo-oophorectomy - Right fallopian tube with focal residual carcinoma, consistent with high grade serous carcinoma, and serous tubal  intraepithelial carcinoma (STIC) - Associated calcifications and histiocytes suggestive of treatment response - Right ovary with calcifications and histiocytes suggestive of treatment effect, with no definite viable carcinoma identified - Mature cystic teratoma, 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 - See synoptic report and comment   08/14/2017 Tumor Marker   Patient's tumor was tested for the following markers: CA-125 Results of the tumor marker test revealed 14.6   09/04/2017 Tumor Marker   Patient's tumor was tested for the following markers: CA-125 Results of the tumor marker test revealed 12.5   10/03/2017 Genetic Testing   The Common Hereditary Cancer Panel offered by [facility] includes sequencing and/or deletion duplication testing of the following 47 genes: APC, ATM, AXIN2, BARD1, BMPR1A, BRCA1, BRCA2, BRIP1, CDH1, CDKN2A (p14ARF), CDKN2A (p16INK4a), CKD4, CHEK2, CTNNA1, DICER1, EPCAM (Deletion/duplication testing only), GREM1 (promoter region deletion/duplication testing only), KIT, MEN1, MLH1, MSH2, MSH3, MSH6, MUTYH, NBN, NF1, NHTL1, PALB2, PDGFRA, PMS2, POLD1, POLE, PTEN, RAD50, RAD51C, RAD51D, SDHB, SDHC, SDHD, SMAD4, SMARCA4. STK11, TP53, TSC1, TSC2, and VHL.  The following genes were evaluated for sequence changes only: SDHA and HOXB13 c.251G>A variant only.  Results: Negative, no pathogenic variants identified.  The date of this test report is 10/03/2017.    10/28/2017 Imaging   1. Stable pulmonary nodules. No new or progressive findings. 2. No residual or recurrent omental disease is identified. No obvious peritoneal surface disease. 3. Increasing soft tissue density and ill-defined interstitial changes around the abdominal wall surgical incision. This may be progressive postoperative changes or granulation tissue but attention on future scans to exclude the possibility of tumor. 4. Status post resection of right adnexal mass. No pelvic mass or pelvic adenopathy. 5.  Stable T12 compression fracture.    01/25/2018 Tumor Marker   Patient's tumor was tested for the following markers: CA-125 Results of the tumor marker test revealed 9.2   12/10/2018 Tumor Marker   Patient's tumor was tested for the following markers: CA-125 Results of the tumor marker test revealed 212   12/22/2018 Imaging   1. Interval development of multiple soft tissue lesions in the gastrocolic ligament, mesentery, and along the peritoneal surface. These soft tissue lesions measure up to 6.5 cm in size. Associated interval development of moderate volume ascites in the abdomen and pelvis. Imaging features are consistent with metastatic disease. 2. Soft tissue projects through the rectus sheath in the right paraumbilical region, likely herniated small bowel as there is a large amount of decompressed small bowel does deep to this finding. However, metastatic deposit at this location is also possibility.  3. Trace pneumobilia in the left liver suggests prior sphincterotomy. 4.  Aortic Atherosclerois (ICD10-170.0)   12/30/2018 Procedure   Successful ultrasound-guided therapeutic paracentesis yielding 4.5 liters of peritoneal fluid.   12/30/2018 Procedure   Technically successful right IJ power-injectable port catheter placement. Ready for routine use.   12/31/2018 -  Chemotherapy   The patient had carboplatin and taxol for chemotherapy treatment.     01/21/2019 Tumor Marker   Patient's tumor was tested for the following markers: CA-125 Results of the tumor marker test revealed 408   02/11/2019 Tumor Marker   Patient's tumor was tested for the following markers: CA-125 Results of the tumor marker test revealed 646.   03/02/2019 Imaging   1. Marked improvement in peritoneal carcinomatosis/ascites, without complete resolution. 2.  Aortic atherosclerosis (ICD10-170.0).     03/25/2019 Tumor Marker   Patient's tumor was tested for the following markers: CA-125 Results of the tumor marker test revealed 24.1.   04/15/2019 Tumor  Marker   Patient's tumor was tested for the following markers: CA-125 Results of the tumor marker test revealed 15.3   05/18/2019 Imaging   1. There is progressive, nearly complete resolution of peritoneal and omental nodularity seen on prior examination with very faint residual peritoneal and omental haziness (series 2, image 50, 39).   2. Interval resolution of ascites.   3.  Status post hysterectomy and oophorectomy.   4.  Coronary artery disease.  Aortic Atherosclerosis (ICD10-I70.0).      Tumor Marker   Patient's tumor was tested for the following markers: CA-125 Results of the tumor marker test revealed 11.2.     REVIEW OF SYSTEMS:   Constitutional: Denies fevers, chills or abnormal weight loss Eyes: Denies blurriness of vision Ears, nose, mouth, throat, and face: Denies mucositis or sore throat Respiratory: Denies cough, dyspnea or wheezes Cardiovascular: Denies palpitation, chest discomfort or lower extremity swelling Gastrointestinal:  Denies nausea, heartburn or change in bowel habits Skin: Denies abnormal skin rashes Lymphatics: Denies new lymphadenopathy or easy bruising Neurological:Denies numbness, tingling or new weaknesses Behavioral/Psych: Mood is stable, no new changes  All other systems were reviewed with the patient and are negative.  I have reviewed the past medical history, past surgical history, social history and family history with the patient and they are unchanged from previous note.  ALLERGIES:  is allergic to aspirin; nsaids; and tramadol.  MEDICATIONS:  Current Outpatient Medications  Medication Sig Dispense Refill  . acetaminophen (TYLENOL) 650 MG CR tablet Take 1,300 mg by mouth every 8 (eight) hours.     Marland Kitchen alendronate (FOSAMAX) 70 MG tablet Take 70 mg by mouth every Sunday.     Marland Kitchen  atorvastatin (LIPITOR) 80 MG tablet Take 40 mg by mouth at bedtime.     . Cholecalciferol (VITAMIN D3) 2000 units TABS Take 2,000 Units by mouth daily.    .  citalopram (CELEXA) 10 MG tablet Take 10 mg by mouth at bedtime    . ferrous sulfate 325 (65 FE) MG tablet Take 325 mg by mouth daily. 3 hours after meal and 30 min before a meal    . gabapentin (NEURONTIN) 300 MG capsule Take by mouth.    . Glucosamine Sulfate 500 MG TABS Take 500 mg by mouth 2 (two) times daily.     . hydrochlorothiazide (HYDRODIURIL) 25 MG tablet Take 1 tablet (25 mg total) by mouth daily. 90 tablet 3  . lidocaine-prilocaine (EMLA) cream Apply to affected area once 30 g 3  . loratadine (CLARITIN) 10 MG tablet Take 10 mg by mouth at bedtime.     Marland Kitchen losartan (COZAAR) 25 MG tablet Take 25 mg by mouth daily.     . meclizine (ANTIVERT) 25 MG tablet Take 25 mg by mouth 2 (two) times daily as needed for dizziness.     . metFORMIN (GLUCOPHAGE) 1000 MG tablet Take 1,000 mg by mouth 2 (two) times daily with a meal.     . omeprazole (PRILOSEC) 40 MG capsule Take 40 mg by mouth daily.     . ondansetron (ZOFRAN) 8 MG tablet Take 1 tablet (8 mg total) by mouth every 8 (eight) hours as needed for refractory nausea / vomiting. 30 tablet 1  . pioglitazone (ACTOS) 15 MG tablet Take 15 mg by mouth at bedtime.     . prochlorperazine (COMPAZINE) 10 MG tablet Take 1 tablet (10 mg total) by mouth every 6 (six) hours as needed (Nausea or vomiting). 30 tablet 1  . Pyridoxine HCl (VITAMIN B-6 PO) Take 50 mg by mouth 1 day or 1 dose.    . vitamin B-12 (CYANOCOBALAMIN) 1000 MCG tablet Take 1,000 mcg by mouth daily.      No current facility-administered medications for this visit.    Facility-Administered Medications Ordered in Other Visits  Medication Dose Route Frequency Provider Last Rate Last Dose  . sodium chloride flush (NS) 0.9 % injection 10 mL  10 mL Intracatheter Once Heath Lark, MD        PHYSICAL EXAMINATION: ECOG PERFORMANCE STATUS: 0 - Asymptomatic  Vitals:   06/10/19 0914  BP: 132/61  Pulse: 98  Resp: 17  Temp: 98.9 F (37.2 C)  SpO2: 100%   Filed Weights   06/10/19 0914   Weight: 224 lb 8 oz (101.8 kg)    GENERAL:alert, no distress and comfortable SKIN: skin color, texture, turgor are normal, no rashes or significant lesions EYES: normal, Conjunctiva are pink and non-injected, sclera clear OROPHARYNX:no exudate, no erythema and lips, buccal mucosa, and tongue normal  NECK: supple, thyroid normal size, non-tender, without nodularity LYMPH:  no palpable lymphadenopathy in the cervical, axillary or inguinal LUNGS: clear to auscultation and percussion with normal breathing effort HEART: regular rate & rhythm and no murmurs and no lower extremity edema ABDOMEN:abdomen soft, non-tender and normal bowel sounds Musculoskeletal:no cyanosis of digits and no clubbing  NEURO: alert & oriented x 3 with fluent speech, no focal motor/sensory deficits  LABORATORY DATA:  I have reviewed the data as listed    Component Value Date/Time   NA 141 05/20/2019 0935   NA 139 06/05/2017 0746   K 4.3 05/20/2019 0935   K 4.1 06/05/2017 0746   CL 105  05/20/2019 0935   CO2 21 (L) 05/20/2019 0935   CO2 21 (L) 06/05/2017 0746   GLUCOSE 264 (H) 05/20/2019 0935   GLUCOSE 279 (H) 06/05/2017 0746   BUN 26 (H) 05/20/2019 0935   BUN 15.0 06/05/2017 0746   CREATININE 0.93 05/20/2019 0935   CREATININE 0.9 06/05/2017 0746   CALCIUM 8.8 (L) 05/20/2019 0935   CALCIUM 10.1 06/05/2017 0746   PROT 7.8 05/20/2019 0935   PROT 8.1 06/05/2017 0746   ALBUMIN 3.9 05/20/2019 0935   ALBUMIN 4.0 06/05/2017 0746   AST 22 05/20/2019 0935   AST 22 06/05/2017 0746   ALT 29 05/20/2019 0935   ALT 36 06/05/2017 0746   ALKPHOS 84 05/20/2019 0935   ALKPHOS 98 06/05/2017 0746   BILITOT 0.4 05/20/2019 0935   BILITOT 0.44 06/05/2017 0746   GFRNONAA >60 05/20/2019 0935   GFRAA >60 05/20/2019 0935    No results found for: SPEP, UPEP  Lab Results  Component Value Date   WBC 4.0 06/10/2019   NEUTROABS 2.7 06/10/2019   HGB 8.8 (L) 06/10/2019   HCT 28.0 (L) 06/10/2019   MCV 109.4 (H) 06/10/2019    PLT 221 06/10/2019      Chemistry      Component Value Date/Time   NA 141 05/20/2019 0935   NA 139 06/05/2017 0746   K 4.3 05/20/2019 0935   K 4.1 06/05/2017 0746   CL 105 05/20/2019 0935   CO2 21 (L) 05/20/2019 0935   CO2 21 (L) 06/05/2017 0746   BUN 26 (H) 05/20/2019 0935   BUN 15.0 06/05/2017 0746   CREATININE 0.93 05/20/2019 0935   CREATININE 0.9 06/05/2017 0746      Component Value Date/Time   CALCIUM 8.8 (L) 05/20/2019 0935   CALCIUM 10.1 06/05/2017 0746   ALKPHOS 84 05/20/2019 0935   ALKPHOS 98 06/05/2017 0746   AST 22 05/20/2019 0935   AST 22 06/05/2017 0746   ALT 29 05/20/2019 0935   ALT 36 06/05/2017 0746   BILITOT 0.4 05/20/2019 0935   BILITOT 0.44 06/05/2017 0746       RADIOGRAPHIC STUDIES: I have personally reviewed the radiological images as listed and agreed with the findings in the report. Ct Abdomen Pelvis W Contrast  Result Date: 05/18/2019 CLINICAL DATA:  Ovarian cancer recurrence,, assess treatment response EXAM: CT ABDOMEN AND PELVIS WITH CONTRAST TECHNIQUE: Multidetector CT imaging of the abdomen and pelvis was performed using the standard protocol following bolus administration of intravenous contrast. CONTRAST:  172m OMNIPAQUE IOHEXOL 300 MG/ML SOLN, 395mOMNIPAQUE IOHEXOL 300 MG/ML SOLN, additional oral enteric contrast COMPARISON:  03/02/2019, 06/17/2017, 04/22/2017 FINDINGS: Lower chest: No acute abnormality. Stable, partially imaged 6 mm pulmonary nodule of the anterior right middle lobe (series 6, image 1). Coronary artery calcifications. Hepatobiliary: No focal liver abnormality is seen. Status post cholecystectomy. No biliary dilatation. Pancreas: Fatty atrophy of the pancreatic parenchyma. No pancreatic ductal dilatation or surrounding inflammatory changes. Spleen: Normal in size without significant abnormality. Adrenals/Urinary Tract: Adrenal glands are unremarkable. Kidneys are normal, without renal calculi, solid lesion, or hydronephrosis.  Bladder is unremarkable. Stomach/Bowel: Stomach is within normal limits. Appendix appears normal. No evidence of bowel wall thickening, distention, or inflammatory changes. Occasional sigmoid diverticula Vascular/Lymphatic: Aortic atherosclerosis. No enlarged abdominal or pelvic lymph nodes. Reproductive: Status post hysterectomy and oophorectomy. Other: No abdominal wall hernia or abnormality. Interval resolution of ascites. There is progressive, nearly complete resolution of peritoneal and omental nodularity seen on prior examination with very faint residual peritoneal and omental  haziness (series 2, image 50, 39). Musculoskeletal: No acute or significant osseous findings. Unchanged inferior endplate wedge deformity of the T12 vertebral body. IMPRESSION: 1. There is progressive, nearly complete resolution of peritoneal and omental nodularity seen on prior examination with very faint residual peritoneal and omental haziness (series 2, image 50, 39). 2. Interval resolution of ascites. 3.  Status post hysterectomy and oophorectomy. 4.  Coronary artery disease.  Aortic Atherosclerosis (ICD10-I70.0). Electronically Signed   By: Eddie Candle M.D.   On: 05/18/2019 11:52    All questions were answered. The patient knows to call the clinic with any problems, questions or concerns. No barriers to learning was detected.  I spent 15 minutes counseling the patient face to face. The total time spent in the appointment was 20 minutes and more than 50% was on counseling and review of test results  Heath Lark, MD 06/10/2019 9:21 AM

## 2019-06-10 NOTE — Assessment & Plan Note (Signed)
Her last imaging study showed the patient had complete response to therapy We will proceed with maintenance bevacizumab only We will continue close monitoring of tumor marker So far, she tolerated treatment well except for mild intermittent proteinuria

## 2019-06-10 NOTE — Assessment & Plan Note (Signed)
Her anemia has improved since discontinuation of chemotherapy We will observe closely I anticipate it will continue to improve in the future

## 2019-06-11 LAB — CA 125: Cancer Antigen (CA) 125: 9.1 U/mL (ref 0.0–38.1)

## 2019-06-13 ENCOUNTER — Telehealth: Payer: Self-pay

## 2019-06-13 NOTE — Telephone Encounter (Signed)
-----   Message from Flo Shanks, RN sent at 06/13/2019  9:37 AM EST ----- Regarding: FW: CA-125 is better, pls let her know  ----- Message ----- From: Heath Lark, MD Sent: 06/13/2019   8:04 AM EST To: Flo Shanks, RN Subject: CA-125 is better, pls let her know

## 2019-06-13 NOTE — Telephone Encounter (Signed)
Spoke with patient to inform of improved CA-125 results.  Patient voiced understanding and thanks for call.

## 2019-07-04 ENCOUNTER — Inpatient Hospital Stay: Payer: Medicare Other

## 2019-07-04 ENCOUNTER — Other Ambulatory Visit: Payer: Self-pay | Admitting: Hematology and Oncology

## 2019-07-04 ENCOUNTER — Telehealth: Payer: Self-pay | Admitting: Hematology and Oncology

## 2019-07-04 ENCOUNTER — Other Ambulatory Visit: Payer: Self-pay

## 2019-07-04 ENCOUNTER — Inpatient Hospital Stay (HOSPITAL_BASED_OUTPATIENT_CLINIC_OR_DEPARTMENT_OTHER): Payer: Medicare Other | Admitting: Hematology and Oncology

## 2019-07-04 ENCOUNTER — Encounter: Payer: Self-pay | Admitting: Hematology and Oncology

## 2019-07-04 VITALS — BP 137/53 | HR 81 | Temp 98.5°F | Resp 18 | Ht 67.0 in | Wt 223.4 lb

## 2019-07-04 DIAGNOSIS — I10 Essential (primary) hypertension: Secondary | ICD-10-CM | POA: Diagnosis not present

## 2019-07-04 DIAGNOSIS — Z7189 Other specified counseling: Secondary | ICD-10-CM

## 2019-07-04 DIAGNOSIS — Z5112 Encounter for antineoplastic immunotherapy: Secondary | ICD-10-CM | POA: Diagnosis not present

## 2019-07-04 DIAGNOSIS — D6481 Anemia due to antineoplastic chemotherapy: Secondary | ICD-10-CM | POA: Diagnosis not present

## 2019-07-04 DIAGNOSIS — T451X5A Adverse effect of antineoplastic and immunosuppressive drugs, initial encounter: Secondary | ICD-10-CM | POA: Diagnosis not present

## 2019-07-04 DIAGNOSIS — C561 Malignant neoplasm of right ovary: Secondary | ICD-10-CM | POA: Diagnosis not present

## 2019-07-04 LAB — CBC WITH DIFFERENTIAL (CANCER CENTER ONLY)
Abs Immature Granulocytes: 0.01 10*3/uL (ref 0.00–0.07)
Basophils Absolute: 0 10*3/uL (ref 0.0–0.1)
Basophils Relative: 1 %
Eosinophils Absolute: 0.1 10*3/uL (ref 0.0–0.5)
Eosinophils Relative: 2 %
HCT: 30.4 % — ABNORMAL LOW (ref 36.0–46.0)
Hemoglobin: 9.2 g/dL — ABNORMAL LOW (ref 12.0–15.0)
Immature Granulocytes: 0 %
Lymphocytes Relative: 23 %
Lymphs Abs: 1.1 10*3/uL (ref 0.7–4.0)
MCH: 32.6 pg (ref 26.0–34.0)
MCHC: 30.3 g/dL (ref 30.0–36.0)
MCV: 107.8 fL — ABNORMAL HIGH (ref 80.0–100.0)
Monocytes Absolute: 0.4 10*3/uL (ref 0.1–1.0)
Monocytes Relative: 8 %
Neutro Abs: 3.3 10*3/uL (ref 1.7–7.7)
Neutrophils Relative %: 66 %
Platelet Count: 221 10*3/uL (ref 150–400)
RBC: 2.82 MIL/uL — ABNORMAL LOW (ref 3.87–5.11)
RDW: 15 % (ref 11.5–15.5)
WBC Count: 5 10*3/uL (ref 4.0–10.5)
nRBC: 0 % (ref 0.0–0.2)

## 2019-07-04 LAB — CMP (CANCER CENTER ONLY)
ALT: 24 U/L (ref 0–44)
AST: 23 U/L (ref 15–41)
Albumin: 3.8 g/dL (ref 3.5–5.0)
Alkaline Phosphatase: 75 U/L (ref 38–126)
Anion gap: 12 (ref 5–15)
BUN: 25 mg/dL — ABNORMAL HIGH (ref 8–23)
CO2: 23 mmol/L (ref 22–32)
Calcium: 8.4 mg/dL — ABNORMAL LOW (ref 8.9–10.3)
Chloride: 105 mmol/L (ref 98–111)
Creatinine: 0.93 mg/dL (ref 0.44–1.00)
GFR, Est AFR Am: >60 mL/min (ref >60)
GFR, Estimated: >60 mL/min (ref >60)
Glucose, Bld: 108 mg/dL — ABNORMAL HIGH (ref 70–99)
Potassium: 4.1 mmol/L (ref 3.5–5.1)
Sodium: 140 mmol/L (ref 135–145)
Total Bilirubin: 0.3 mg/dL (ref 0.3–1.2)
Total Protein: 7.4 g/dL (ref 6.5–8.1)

## 2019-07-04 LAB — TOTAL PROTEIN, URINE DIPSTICK

## 2019-07-04 MED ORDER — SODIUM CHLORIDE 0.9 % IV SOLN
Freq: Once | INTRAVENOUS | Status: AC
  Filled 2019-07-04: qty 250

## 2019-07-04 MED ORDER — HEPARIN SOD (PORK) LOCK FLUSH 100 UNIT/ML IV SOLN
500.0000 [IU] | Freq: Once | INTRAVENOUS | Status: AC | PRN
  Administered 2019-07-04: 500 [IU]
  Filled 2019-07-04: qty 5

## 2019-07-04 MED ORDER — SODIUM CHLORIDE 0.9 % IV SOLN
15.0000 mg/kg | Freq: Once | INTRAVENOUS | Status: AC
  Administered 2019-07-04: 1500 mg via INTRAVENOUS
  Filled 2019-07-04: qty 48

## 2019-07-04 MED ORDER — SODIUM CHLORIDE 0.9% FLUSH
10.0000 mL | INTRAVENOUS | Status: DC | PRN
  Administered 2019-07-04: 10 mL
  Filled 2019-07-04: qty 10

## 2019-07-04 NOTE — Assessment & Plan Note (Signed)
Her blood pressure is well controlled She has minimal proteinuria today We will continue aggressive blood pressure monitoring and medical management

## 2019-07-04 NOTE — Assessment & Plan Note (Signed)
So far, she tolerated bevacizumab well without major side effects Her blood pressure control at home is satisfactory Her anemia is improving She will continue maintenance bevacizumab I will monitor tumor marker once a month She will be due to CT imaging in February

## 2019-07-04 NOTE — Telephone Encounter (Signed)
Scheduled appt per 12/28 sch message - pt to get an updated schedule in treatment area.

## 2019-07-04 NOTE — Patient Instructions (Signed)
Bevacizumab injection What is this medicine? BEVACIZUMAB (be va SIZ yoo mab) is a monoclonal antibody. It is used to treat many types of cancer. This medicine may be used for other purposes; ask your health care provider or pharmacist if you have questions. COMMON BRAND NAME(S): Avastin, MVASI, Zirabev What should I tell my health care provider before I take this medicine? They need to know if you have any of these conditions:  diabetes  heart disease  high blood pressure  history of coughing up blood  prior anthracycline chemotherapy (e.g., doxorubicin, daunorubicin, epirubicin)  recent or ongoing radiation therapy  recent or planning to have surgery  stroke  an unusual or allergic reaction to bevacizumab, hamster proteins, mouse proteins, other medicines, foods, dyes, or preservatives  pregnant or trying to get pregnant  breast-feeding How should I use this medicine? This medicine is for infusion into a vein. It is given by a health care professional in a hospital or clinic setting. Talk to your pediatrician regarding the use of this medicine in children. Special care may be needed. Overdosage: If you think you have taken too much of this medicine contact a poison control center or emergency room at once. NOTE: This medicine is only for you. Do not share this medicine with others. What if I miss a dose? It is important not to miss your dose. Call your doctor or health care professional if you are unable to keep an appointment. What may interact with this medicine? Interactions are not expected. This list may not describe all possible interactions. Give your health care provider a list of all the medicines, herbs, non-prescription drugs, or dietary supplements you use. Also tell them if you smoke, drink alcohol, or use illegal drugs. Some items may interact with your medicine. What should I watch for while using this medicine? Your condition will be monitored carefully while  you are receiving this medicine. You will need important blood work and urine testing done while you are taking this medicine. This medicine may increase your risk to bruise or bleed. Call your doctor or health care professional if you notice any unusual bleeding. This medicine should be started at least 28 days following major surgery and the site of the surgery should be totally healed. Check with your doctor before scheduling dental work or surgery while you are receiving this treatment. Talk to your doctor if you have recently had surgery or if you have a wound that has not healed. Do not become pregnant while taking this medicine or for 6 months after stopping it. Women should inform their doctor if they wish to become pregnant or think they might be pregnant. There is a potential for serious side effects to an unborn child. Talk to your health care professional or pharmacist for more information. Do not breast-feed an infant while taking this medicine and for 6 months after the last dose. This medicine has caused ovarian failure in some women. This medicine may interfere with the ability to have a child. You should talk to your doctor or health care professional if you are concerned about your fertility. What side effects may I notice from receiving this medicine? Side effects that you should report to your doctor or health care professional as soon as possible:  allergic reactions like skin rash, itching or hives, swelling of the face, lips, or tongue  chest pain or chest tightness  chills  coughing up blood  high fever  seizures  severe constipation  signs and symptoms   of bleeding such as bloody or black, tarry stools; red or dark-brown urine; spitting up blood or brown material that looks like coffee grounds; red spots on the skin; unusual bruising or bleeding from the eye, gums, or nose  signs and symptoms of a blood clot such as breathing problems; chest pain; severe, sudden  headache; pain, swelling, warmth in the leg  signs and symptoms of a stroke like changes in vision; confusion; trouble speaking or understanding; severe headaches; sudden numbness or weakness of the face, arm or leg; trouble walking; dizziness; loss of balance or coordination  stomach pain  sweating  swelling of legs or ankles  vomiting  weight gain Side effects that usually do not require medical attention (report to your doctor or health care professional if they continue or are bothersome):  back pain  changes in taste  decreased appetite  dry skin  nausea  tiredness This list may not describe all possible side effects. Call your doctor for medical advice about side effects. You may report side effects to FDA at 1-800-FDA-1088. Where should I keep my medicine? This drug is given in a hospital or clinic and will not be stored at home. NOTE: This sheet is a summary. It may not cover all possible information. If you have questions about this medicine, talk to your doctor, pharmacist, or health care provider.  2020 [employer] Standard (2016-06-20 14:33:29)  

## 2019-07-04 NOTE — Progress Notes (Signed)
[place] OFFICE PROGRESS NOTE  Patient Care Team: Lilian Coma., MD as PCP - General (Internal Medicine)  ASSESSMENT & PLAN:  Ovarian cancer on right [facility]) So far, she tolerated bevacizumab well without major side effects Her blood pressure control at home is satisfactory Her anemia is improving She will continue maintenance bevacizumab I will monitor tumor marker once a month She will be due to CT imaging in February  Essential hypertension Her blood pressure is well controlled She has minimal proteinuria today We will continue aggressive blood pressure monitoring and medical management  Anemia due to antineoplastic chemotherapy Her anemia has improved since discontinuation of chemotherapy We will observe closely   Orders Placed This Encounter  Procedures  . CT Abdomen Pelvis W Contrast    Standing Status:   Future    Standing Expiration Date:   07/03/2020    Order Specific Question:   If indicated for the ordered procedure, I authorize the administration of contrast media per Radiology protocol    Answer:   Yes    Order Specific Question:   Preferred imaging location?    Answer:   [facility]    Order Specific Question:   Radiology Contrast Protocol - do NOT remove file path    Answer:   [email or web address]    INTERVAL HISTORY: Please see below for problem oriented charting. 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    03/20/2017 Imaging   GYN ultrasound was performed this revealed a small fundus measuring 5 cm there are 2 small fibroids within the fundus with the largest measuring 1.3 cm Within the endometrium there is a small amount a fluid the endometrial wall was slightly thickened at 4.8 mm the ultrasound was consistent with a small endometrial polyp  The right adnexa measured 7.4 x 6.7 cm contained a both a cystic and solid mass with a small amount of shadowing This is consistent with a dermoid tumor the left ovary was 2.6 x 1.5 x 1.0 cm There is no other adnexal masses no free fluid within the abdomen no ascites     04/10/2017 Surgery   Operation: Operative Laparscopy with  Peritoneal biopsy 2 hysteroscopy D&C Surgeon: Lynder Parents MD  Specimens: Peritoneal biopsy and cellular washings  Complications: None FIndings Small amount of ascites was found cellular washings were sent There was peritoneal studding And omental caking Even in steep Trendelenburg the ovaries and uterus were unable to be visualized due to the small intestines in the lower pelvis     04/10/2017 Pathology Results   Biopsy from [facility] was nondiagnostic.  Peritoneum biopsy did not reveal malignancy.  Endometrial biopsy also did not reveal malignancy.   04/13/2017 Tumor Marker   Patient's tumor was tested for the following markers: CA-125 Results of the tumor marker test revealed 324.1  04/20/2017 Tumor Marker   Patient's tumor was tested for the following markers: CA-125 Results of the tumor marker test revealed 345.6   04/22/2017 Pathology Results   Peritoneum, biopsy - CARCINOMA. - SEE COMMENT.   04/22/2017 Procedure   CT-guided core biopsy performed of peritoneal tumor in the left lower quadrant.   04/24/2017  - 09/25/2017 Chemotherapy   She received carboplatin and Taxol x 3 cycles followed by interval surgical debulking on 12/31. Chemotherapy was subsequently resumed for 3 more cycles   06/05/2017 Tumor Marker   Patient's tumor was tested for the following markers: CA-125 Results of the tumor marker test revealed 27.5   06/17/2017 Imaging   1. Considerable improvement in the prior omental caking, which now presents mainly as an indistinct stranding with slight nodularity along the left upper quadrant omentum. 2. Stable appearance of right ovarian mass with fatty and calcific elements classic for a dermoid. 3. There is accentuated enhancement in the walls of the common hepatic duct and common bile duct. A low-grade cholangitis is not readily excluded. 4. Other imaging findings of potential clinical significance: Mild cardiomegaly. Aortic Atherosclerosis (ICD10-I70.0). Lumbar scoliosis, spondylosis, degenerative disc disease, and pars defects at L5. These cause impingement at L5-S1 and lesser impingement at L3-4 and L4-5. 5. 65% compression fracture at L1 slightly worsened than on the prior exam, with associated vertebral sclerosis and posterior retropulsion.   07/06/2017 Surgery   Bilateral Salpingo-Oophorectomy W/Omentectomy, Total Abd Hysterectomy & Radical Dissection For Debulking Right - Ureterolysis, With Or Without Repositioning Of Ureter For Retroperitoneal Fibrosis   07/06/2017 Pathology Results   A: Omentum, omentectomy - Omentum with minimal residual carcinoma, consistent with high grade serous carcinoma (microscopic; stage ypT3a) - Extensive treatment effect with calcifications, fibrosis, histiocytes, and giant cell response - CRS score 3  B: Uterus with cervix and left ovary and fallopian tube, hysterectomy and left salpingo-oophorectomy - Cervix: Ectocervix and endocervix with no dysplasia or malignancy identified - Endometrium: Inactive with no hyperplasia, atypia, or malignancy  identified - Myometrium: Adenomyosis and benign leiomyoma, size 1.2 cm - Serosa: Focal calcification and histiocytes suggestive of treatment effect, with no viable carcinoma identified - Ovary, left: Physiologic changes and no carcinoma identified - Fallopian tube, left: 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 and no malignancy identified  C: Ovary and fallopian tube, right, salpingo-oophorectomy - Right fallopian tube with focal residual carcinoma, consistent with high grade serous carcinoma, and serous tubal intraepithelial carcinoma (STIC) - Associated calcifications and histiocytes suggestive of treatment response - Right ovary with calcifications and histiocytes suggestive of treatment effect, with no definite viable carcinoma identified - Mature cystic teratoma, 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 - See synoptic report and comment   08/14/2017 Tumor Marker   Patient's tumor was tested for the following markers: CA-125 Results of the tumor marker test revealed 14.6   09/04/2017 Tumor Marker   Patient's tumor was tested for the following markers: CA-125 Results of the tumor marker test revealed 12.5   10/03/2017 Genetic Testing   The Common Hereditary Cancer Panel offered by [facility] includes sequencing and/or deletion duplication testing of the following 47 genes: APC, ATM, AXIN2, BARD1, BMPR1A, BRCA1, BRCA2, BRIP1, CDH1, CDKN2A (p14ARF), CDKN2A (p16INK4a), CKD4, CHEK2, CTNNA1, DICER1, EPCAM (Deletion/duplication testing only), GREM1 (promoter region deletion/duplication testing only), KIT, MEN1, MLH1, MSH2, MSH3, MSH6, MUTYH, NBN, NF1, NHTL1, PALB2, PDGFRA, PMS2, POLD1, POLE, PTEN, RAD50, RAD51C, RAD51D, SDHB, SDHC, SDHD, SMAD4, SMARCA4. STK11, TP53, TSC1, TSC2, and VHL.  The following genes were evaluated for sequence changes only:  SDHA and HOXB13 c.251G>A variant only.  Results: Negative, no pathogenic variants identified.  The date of this test report is 10/03/2017.    10/28/2017 Imaging   1. Stable  pulmonary nodules. No new or progressive findings. 2. No residual or recurrent omental disease is identified. No obvious peritoneal surface disease. 3. Increasing soft tissue density and ill-defined interstitial changes around the abdominal wall surgical incision. This may be progressive postoperative changes or granulation tissue but attention on future scans to exclude the possibility of tumor. 4. Status post resection of right adnexal mass. No pelvic mass or pelvic adenopathy. 5. Stable T12 compression fracture.   01/25/2018 Tumor Marker   Patient's tumor was tested for the following markers: CA-125 Results of the tumor marker test revealed 9.2   12/10/2018 Tumor Marker   Patient's tumor was tested for the following markers: CA-125 Results of the tumor marker test revealed 212   12/22/2018 Imaging   1. Interval development of multiple soft tissue lesions in the gastrocolic ligament, mesentery, and along the peritoneal surface. These soft tissue lesions measure up to 6.5 cm in size. Associated interval development of moderate volume ascites in the abdomen and pelvis. Imaging features are consistent with metastatic disease. 2. Soft tissue projects through the rectus sheath in the right paraumbilical region, likely herniated small bowel as there is a large amount of decompressed small bowel does deep to this finding. However, metastatic deposit at this location is also possibility.  3. Trace pneumobilia in the left liver suggests prior sphincterotomy. 4.  Aortic Atherosclerois (ICD10-170.0)   12/30/2018 Procedure   Successful ultrasound-guided therapeutic paracentesis yielding 4.5 liters of peritoneal fluid.   12/30/2018 Procedure   Technically successful right IJ power-injectable port catheter placement. Ready for routine use.   12/31/2018 -  Chemotherapy   The patient had carboplatin and taxol for chemotherapy treatment.     01/21/2019 Tumor Marker   Patient's tumor was tested for the  following markers: CA-125 Results of the tumor marker test revealed 408   02/11/2019 Tumor Marker   Patient's tumor was tested for the following markers: CA-125 Results of the tumor marker test revealed 646.   03/02/2019 Imaging   1. Marked improvement in peritoneal carcinomatosis/ascites, without complete resolution. 2.  Aortic atherosclerosis (ICD10-170.0).     03/25/2019 Tumor Marker   Patient's tumor was tested for the following markers: CA-125 Results of the tumor marker test revealed 24.1.   04/15/2019 Tumor Marker   Patient's tumor was tested for the following markers: CA-125 Results of the tumor marker test revealed 15.3   05/18/2019 Imaging   1. There is progressive, nearly complete resolution of peritoneal and omental nodularity seen on prior examination with very faint residual peritoneal and omental haziness (series 2, image 50, 39).   2. Interval resolution of ascites.   3.  Status post hysterectomy and oophorectomy.   4.  Coronary artery disease.  Aortic Atherosclerosis (ICD10-I70.0).      Tumor Marker   Patient's tumor was tested for the following markers: CA-125 Results of the tumor marker test revealed 11.2.   06/10/2019 Tumor Marker   Patient's tumor was tested for the following markers: CA-125 Results of the tumor marker test revealed 9.1     REVIEW OF SYSTEMS:   Constitutional: Denies fevers, chills or abnormal weight loss Eyes: Denies blurriness of vision Ears, nose, mouth, throat, and face: Denies mucositis or sore throat Respiratory: Denies cough, dyspnea or wheezes Cardiovascular: Denies palpitation, chest discomfort or lower extremity swelling Gastrointestinal:  Denies nausea, heartburn or change in bowel habits Skin: Denies abnormal skin rashes Lymphatics: Denies new lymphadenopathy or easy bruising Neurological:Denies numbness, tingling or new weaknesses Behavioral/Psych: Mood is stable, no new changes  All other systems were reviewed with the  patient and are negative.  I have reviewed the past medical history, past surgical history, social history and family history with the patient and they are unchanged from previous note.  ALLERGIES:  is allergic to aspirin; nsaids; and tramadol.  MEDICATIONS:  Current Outpatient Medications  Medication Sig Dispense Refill  . acetaminophen (TYLENOL) 650 MG CR tablet Take 1,300 mg by mouth every 8 (eight) hours.     Marland Kitchen alendronate (FOSAMAX) 70 MG tablet Take 70 mg by mouth every Sunday.     Marland Kitchen atorvastatin (LIPITOR) 80 MG tablet Take 40 mg by mouth at bedtime.     . Cholecalciferol (VITAMIN D3) 2000 units TABS Take 2,000 Units by mouth daily.    . citalopram (CELEXA) 10 MG tablet Take 10 mg by mouth at bedtime    . ferrous sulfate 325 (65 FE) MG tablet Take 325 mg by mouth daily. 3 hours after meal and 30 min before a meal    . gabapentin (NEURONTIN) 300 MG capsule Take by mouth.    . Glucosamine Sulfate 500 MG TABS Take 500 mg by mouth 2 (two) times daily.     . hydrochlorothiazide (HYDRODIURIL) 25 MG tablet Take 1 tablet (25 mg total) by mouth daily. 90 tablet 3  . lidocaine-prilocaine (EMLA) cream Apply to affected area once 30 g 3  . loratadine (CLARITIN) 10 MG tablet Take 10 mg by mouth at bedtime.     Marland Kitchen losartan (COZAAR) 25 MG tablet Take 25 mg by mouth daily.     . meclizine (ANTIVERT) 25 MG tablet Take 25 mg by mouth 2 (two) times daily as needed for dizziness.     . metFORMIN (GLUCOPHAGE) 1000 MG tablet Take 1,000 mg by mouth 2 (two) times daily with a meal.     . omeprazole (PRILOSEC) 40 MG capsule Take 40 mg by mouth daily.     . ondansetron (ZOFRAN) 8 MG tablet Take 1 tablet (8 mg total) by mouth every 8 (eight) hours as needed for refractory nausea / vomiting. 30 tablet 1  . pioglitazone (ACTOS) 15 MG tablet Take 15 mg by mouth at bedtime.     . prochlorperazine (COMPAZINE) 10 MG tablet Take 1 tablet (10 mg total) by mouth every 6 (six) hours as needed (Nausea or vomiting). 30  tablet 1  . Pyridoxine HCl (VITAMIN B-6 PO) Take 50 mg by mouth 1 day or 1 dose.    . vitamin B-12 (CYANOCOBALAMIN) 1000 MCG tablet Take 1,000 mcg by mouth daily.      No current facility-administered medications for this visit.   Facility-Administered Medications Ordered in Other Visits  Medication Dose Route Frequency Provider Last Rate Last Admin  . sodium chloride flush (NS) 0.9 % injection 10 mL  10 mL Intracatheter Once [name], Tymika Grilli, MD      . sodium chloride flush (NS) 0.9 % injection 10 mL  10 mL Intracatheter PRN [name], Quanda Pavlicek, MD   10 mL at 07/04/19 1204    PHYSICAL EXAMINATION: ECOG PERFORMANCE STATUS: 1 - Symptomatic but completely ambulatory  Vitals:   07/04/19 1003  BP: (!) 137/53  Pulse: 81  Resp: 18  Temp: 98.5 F (36.9 C)  SpO2: 99%   Filed Weights   07/04/19 1003  Weight: 223 lb  6.4 oz (101.3 kg)    GENERAL:alert, no distress and comfortable SKIN: skin color, texture, turgor are normal, no rashes or significant lesions EYES: normal, Conjunctiva are pink and non-injected, sclera clear OROPHARYNX:no exudate, no erythema and lips, buccal mucosa, and tongue normal  NECK: supple, thyroid normal size, non-tender, without nodularity LYMPH:  no palpable lymphadenopathy in the cervical, axillary or inguinal LUNGS: clear to auscultation and percussion with normal breathing effort HEART: regular rate & rhythm and no murmurs and no lower extremity edema ABDOMEN:abdomen soft, non-tender and normal bowel sounds Musculoskeletal:no cyanosis of digits and no clubbing  NEURO: alert & oriented x 3 with fluent speech, no focal motor/sensory deficits  LABORATORY DATA:  I have reviewed the data as listed    Component Value Date/Time   NA 140 07/04/2019 0946   NA 139 06/05/2017 0746   K 4.1 07/04/2019 0946   K 4.1 06/05/2017 0746   CL 105 07/04/2019 0946   CO2 23 07/04/2019 0946   CO2 21 (L) 06/05/2017 0746   GLUCOSE 108 (H) 07/04/2019 0946   GLUCOSE 279 (H) 06/05/2017  0746   BUN 25 (H) 07/04/2019 0946   BUN 15.0 06/05/2017 0746   CREATININE 0.93 07/04/2019 0946   CREATININE 0.9 06/05/2017 0746   CALCIUM 8.4 (L) 07/04/2019 0946   CALCIUM 10.1 06/05/2017 0746   PROT 7.4 07/04/2019 0946   PROT 8.1 06/05/2017 0746   ALBUMIN 3.8 07/04/2019 0946   ALBUMIN 4.0 06/05/2017 0746   AST 23 07/04/2019 0946   AST 22 06/05/2017 0746   ALT 24 07/04/2019 0946   ALT 36 06/05/2017 0746   ALKPHOS 75 07/04/2019 0946   ALKPHOS 98 06/05/2017 0746   BILITOT 0.3 07/04/2019 0946   BILITOT 0.44 06/05/2017 0746   GFRNONAA >60 07/04/2019 0946   GFRAA >60 07/04/2019 0946    No results found for: SPEP, UPEP  Lab Results  Component Value Date   WBC 5.0 07/04/2019   NEUTROABS 3.3 07/04/2019   HGB 9.2 (L) 07/04/2019   HCT 30.4 (L) 07/04/2019   MCV 107.8 (H) 07/04/2019   PLT 221 07/04/2019      Chemistry      Component Value Date/Time   NA 140 07/04/2019 0946   NA 139 06/05/2017 0746   K 4.1 07/04/2019 0946   K 4.1 06/05/2017 0746   CL 105 07/04/2019 0946   CO2 23 07/04/2019 0946   CO2 21 (L) 06/05/2017 0746   BUN 25 (H) 07/04/2019 0946   BUN 15.0 06/05/2017 0746   CREATININE 0.93 07/04/2019 0946   CREATININE 0.9 06/05/2017 0746      Component Value Date/Time   CALCIUM 8.4 (L) 07/04/2019 0946   CALCIUM 10.1 06/05/2017 0746   ALKPHOS 75 07/04/2019 0946   ALKPHOS 98 06/05/2017 0746   AST 23 07/04/2019 0946   AST 22 06/05/2017 0746   ALT 24 07/04/2019 0946   ALT 36 06/05/2017 0746   BILITOT 0.3 07/04/2019 0946   BILITOT 0.44 06/05/2017 0746     All questions were answered. The patient knows to call the clinic with any problems, questions or concerns. No barriers to learning was detected.  I spent 15 minutes counseling the patient face to face. The total time spent in the appointment was 20 minutes and more than 50% was on counseling and review of test results  Heath Lark, MD 07/04/2019 2:21 PM

## 2019-07-04 NOTE — Assessment & Plan Note (Signed)
Her anemia has improved since discontinuation of chemotherapy We will observe closely

## 2019-07-29 ENCOUNTER — Inpatient Hospital Stay: Payer: Medicare Other | Attending: Gynecology

## 2019-07-29 ENCOUNTER — Other Ambulatory Visit: Payer: Self-pay

## 2019-07-29 ENCOUNTER — Inpatient Hospital Stay: Payer: Medicare Other

## 2019-07-29 VITALS — BP 127/51 | HR 74 | Temp 98.2°F | Resp 18 | Wt 222.0 lb

## 2019-07-29 DIAGNOSIS — C561 Malignant neoplasm of right ovary: Secondary | ICD-10-CM

## 2019-07-29 DIAGNOSIS — Z7189 Other specified counseling: Secondary | ICD-10-CM

## 2019-07-29 DIAGNOSIS — Z5112 Encounter for antineoplastic immunotherapy: Secondary | ICD-10-CM | POA: Insufficient documentation

## 2019-07-29 LAB — CMP (CANCER CENTER ONLY)
ALT: 18 U/L (ref 0–44)
AST: 20 U/L (ref 15–41)
Albumin: 3.7 g/dL (ref 3.5–5.0)
Alkaline Phosphatase: 66 U/L (ref 38–126)
Anion gap: 12 (ref 5–15)
BUN: 25 mg/dL — ABNORMAL HIGH (ref 8–23)
CO2: 24 mmol/L (ref 22–32)
Calcium: 8.9 mg/dL (ref 8.9–10.3)
Chloride: 104 mmol/L (ref 98–111)
Creatinine: 0.94 mg/dL (ref 0.44–1.00)
GFR, Est AFR Am: >60 mL/min (ref >60)
GFR, Estimated: >60 mL/min (ref >60)
Glucose, Bld: 162 mg/dL — ABNORMAL HIGH (ref 70–99)
Potassium: 4 mmol/L (ref 3.5–5.1)
Sodium: 140 mmol/L (ref 135–145)
Total Bilirubin: 0.3 mg/dL (ref 0.3–1.2)
Total Protein: 7.1 g/dL (ref 6.5–8.1)

## 2019-07-29 LAB — CBC WITH DIFFERENTIAL (CANCER CENTER ONLY)
Abs Immature Granulocytes: 0.01 10*3/uL (ref 0.00–0.07)
Basophils Absolute: 0 10*3/uL (ref 0.0–0.1)
Basophils Relative: 1 %
Eosinophils Absolute: 0.1 10*3/uL (ref 0.0–0.5)
Eosinophils Relative: 2 %
HCT: 30.7 % — ABNORMAL LOW (ref 36.0–46.0)
Hemoglobin: 9.6 g/dL — ABNORMAL LOW (ref 12.0–15.0)
Immature Granulocytes: 0 %
Lymphocytes Relative: 26 %
Lymphs Abs: 1.1 10*3/uL (ref 0.7–4.0)
MCH: 32.1 pg (ref 26.0–34.0)
MCHC: 31.3 g/dL (ref 30.0–36.0)
MCV: 102.7 fL — ABNORMAL HIGH (ref 80.0–100.0)
Monocytes Absolute: 0.3 10*3/uL (ref 0.1–1.0)
Monocytes Relative: 8 %
Neutro Abs: 2.7 10*3/uL (ref 1.7–7.7)
Neutrophils Relative %: 63 %
Platelet Count: 215 10*3/uL (ref 150–400)
RBC: 2.99 MIL/uL — ABNORMAL LOW (ref 3.87–5.11)
RDW: 15.1 % (ref 11.5–15.5)
WBC Count: 4.2 10*3/uL (ref 4.0–10.5)
nRBC: 0 % (ref 0.0–0.2)

## 2019-07-29 LAB — TOTAL PROTEIN, URINE DIPSTICK: Protein, ur: NEGATIVE mg/dL

## 2019-07-29 MED ORDER — SODIUM CHLORIDE 0.9% FLUSH
10.0000 mL | Freq: Once | INTRAVENOUS | Status: AC
  Administered 2019-07-29: 10 mL
  Filled 2019-07-29: qty 10

## 2019-07-29 MED ORDER — HEPARIN SOD (PORK) LOCK FLUSH 100 UNIT/ML IV SOLN
500.0000 [IU] | Freq: Once | INTRAVENOUS | Status: AC | PRN
  Administered 2019-07-29: 500 [IU]
  Filled 2019-07-29: qty 5

## 2019-07-29 MED ORDER — SODIUM CHLORIDE 0.9 % IV SOLN
Freq: Once | INTRAVENOUS | Status: AC
  Filled 2019-07-29: qty 250

## 2019-07-29 MED ORDER — SODIUM CHLORIDE 0.9% FLUSH
10.0000 mL | INTRAVENOUS | Status: DC | PRN
  Administered 2019-07-29: 10 mL
  Filled 2019-07-29: qty 10

## 2019-07-29 MED ORDER — SODIUM CHLORIDE 0.9 % IV SOLN
15.0000 mg/kg | Freq: Once | INTRAVENOUS | Status: AC
  Administered 2019-07-29: 1500 mg via INTRAVENOUS
  Filled 2019-07-29: qty 48

## 2019-07-29 NOTE — Patient Instructions (Signed)
Bevacizumab injection What is this medicine? BEVACIZUMAB (be va SIZ yoo mab) is a monoclonal antibody. It is used to treat many types of cancer. This medicine may be used for other purposes; ask your health care provider or pharmacist if you have questions. COMMON BRAND NAME(S): Avastin, MVASI, Zirabev What should I tell my health care provider before I take this medicine? They need to know if you have any of these conditions:  diabetes  heart disease  high blood pressure  history of coughing up blood  prior anthracycline chemotherapy (e.g., doxorubicin, daunorubicin, epirubicin)  recent or ongoing radiation therapy  recent or planning to have surgery  stroke  an unusual or allergic reaction to bevacizumab, hamster proteins, mouse proteins, other medicines, foods, dyes, or preservatives  pregnant or trying to get pregnant  breast-feeding How should I use this medicine? This medicine is for infusion into a vein. It is given by a health care professional in a hospital or clinic setting. Talk to your pediatrician regarding the use of this medicine in children. Special care may be needed. Overdosage: If you think you have taken too much of this medicine contact a poison control center or emergency room at once. NOTE: This medicine is only for you. Do not share this medicine with others. What if I miss a dose? It is important not to miss your dose. Call your doctor or health care professional if you are unable to keep an appointment. What may interact with this medicine? Interactions are not expected. This list may not describe all possible interactions. Give your health care provider a list of all the medicines, herbs, non-prescription drugs, or dietary supplements you use. Also tell them if you smoke, drink alcohol, or use illegal drugs. Some items may interact with your medicine. What should I watch for while using this medicine? Your condition will be monitored carefully while  you are receiving this medicine. You will need important blood work and urine testing done while you are taking this medicine. This medicine may increase your risk to bruise or bleed. Call your doctor or health care professional if you notice any unusual bleeding. This medicine should be started at least 28 days following major surgery and the site of the surgery should be totally healed. Check with your doctor before scheduling dental work or surgery while you are receiving this treatment. Talk to your doctor if you have recently had surgery or if you have a wound that has not healed. Do not become pregnant while taking this medicine or for 6 months after stopping it. Women should inform their doctor if they wish to become pregnant or think they might be pregnant. There is a potential for serious side effects to an unborn child. Talk to your health care professional or pharmacist for more information. Do not breast-feed an infant while taking this medicine and for 6 months after the last dose. This medicine has caused ovarian failure in some women. This medicine may interfere with the ability to have a child. You should talk to your doctor or health care professional if you are concerned about your fertility. What side effects may I notice from receiving this medicine? Side effects that you should report to your doctor or health care professional as soon as possible:  allergic reactions like skin rash, itching or hives, swelling of the face, lips, or tongue  chest pain or chest tightness  chills  coughing up blood  high fever  seizures  severe constipation  signs and symptoms   of bleeding such as bloody or black, tarry stools; red or dark-brown urine; spitting up blood or brown material that looks like coffee grounds; red spots on the skin; unusual bruising or bleeding from the eye, gums, or nose  signs and symptoms of a blood clot such as breathing problems; chest pain; severe, sudden  headache; pain, swelling, warmth in the leg  signs and symptoms of a stroke like changes in vision; confusion; trouble speaking or understanding; severe headaches; sudden numbness or weakness of the face, arm or leg; trouble walking; dizziness; loss of balance or coordination  stomach pain  sweating  swelling of legs or ankles  vomiting  weight gain Side effects that usually do not require medical attention (report to your doctor or health care professional if they continue or are bothersome):  back pain  changes in taste  decreased appetite  dry skin  nausea  tiredness This list may not describe all possible side effects. Call your doctor for medical advice about side effects. You may report side effects to FDA at 1-800-FDA-1088. Where should I keep my medicine? This drug is given in a hospital or clinic and will not be stored at home. NOTE: This sheet is a summary. It may not cover all possible information. If you have questions about this medicine, talk to your doctor, pharmacist, or health care provider.  2020 [employer] Standard (2016-06-20 14:33:29)  

## 2019-07-29 NOTE — Patient Instructions (Signed)

## 2019-07-30 LAB — CA 125: Cancer Antigen (CA) 125: 8.8 U/mL (ref 0.0–38.1)

## 2019-08-01 ENCOUNTER — Telehealth: Payer: Self-pay | Admitting: *Deleted

## 2019-08-01 NOTE — Telephone Encounter (Signed)
-----   Message from Heath Lark, MD sent at 08/01/2019  8:22 AM EST ----- Regarding: pls let her know CA-125 is ok

## 2019-08-01 NOTE — Telephone Encounter (Signed)
Telephone call to patient to advise lab results. Patient appreciated call and was very grateful for the results.

## 2019-08-18 ENCOUNTER — Ambulatory Visit (HOSPITAL_COMMUNITY)
Admission: RE | Admit: 2019-08-18 | Discharge: 2019-08-18 | Disposition: A | Discharge status: Home / Self Care | Payer: Medicare Other | Source: Ambulatory Visit | Attending: Hematology and Oncology | Admitting: Hematology and Oncology

## 2019-08-18 ENCOUNTER — Other Ambulatory Visit: Payer: Self-pay

## 2019-08-18 DIAGNOSIS — C561 Malignant neoplasm of right ovary: Secondary | ICD-10-CM | POA: Diagnosis present

## 2019-08-18 MED ORDER — IOHEXOL 9 MG/ML PO SOLN
500.0000 mL | ORAL | Status: AC
  Administered 2019-08-18: 09:00:00 500 mL via ORAL

## 2019-08-18 MED ORDER — IOHEXOL 9 MG/ML PO SOLN
ORAL | Status: AC
  Administered 2019-08-18: 500 mL via ORAL
  Filled 2019-08-18: qty 1000

## 2019-08-18 MED ORDER — IOHEXOL 300 MG/ML  SOLN
100.0000 mL | Freq: Once | INTRAMUSCULAR | Status: AC | PRN
  Administered 2019-08-18: 100 mL via INTRAVENOUS

## 2019-08-18 MED ORDER — HEPARIN SOD (PORK) LOCK FLUSH 100 UNIT/ML IV SOLN
INTRAVENOUS | Status: AC
  Administered 2019-08-18: 11:00:00 500 [IU] via INTRAVENOUS
  Filled 2019-08-18: qty 5

## 2019-08-18 MED ORDER — SODIUM CHLORIDE (PF) 0.9 % IJ SOLN
INTRAMUSCULAR | Status: AC
  Filled 2019-08-18: qty 50

## 2019-08-18 MED ORDER — HEPARIN SOD (PORK) LOCK FLUSH 100 UNIT/ML IV SOLN: 500.0000 [IU] | Freq: Once | INTRAVENOUS | Status: DC

## 2019-08-19 ENCOUNTER — Inpatient Hospital Stay: Payer: Medicare Other

## 2019-08-19 ENCOUNTER — Inpatient Hospital Stay: Payer: Medicare Other | Attending: Gynecology

## 2019-08-19 ENCOUNTER — Inpatient Hospital Stay (HOSPITAL_BASED_OUTPATIENT_CLINIC_OR_DEPARTMENT_OTHER): Payer: Medicare Other | Admitting: Hematology and Oncology

## 2019-08-19 ENCOUNTER — Telehealth: Payer: Self-pay

## 2019-08-19 ENCOUNTER — Encounter: Payer: Self-pay | Admitting: Hematology and Oncology

## 2019-08-19 ENCOUNTER — Other Ambulatory Visit: Payer: Self-pay

## 2019-08-19 DIAGNOSIS — I10 Essential (primary) hypertension: Secondary | ICD-10-CM | POA: Diagnosis not present

## 2019-08-19 DIAGNOSIS — C561 Malignant neoplasm of right ovary: Secondary | ICD-10-CM | POA: Diagnosis present

## 2019-08-19 DIAGNOSIS — D539 Nutritional anemia, unspecified: Secondary | ICD-10-CM | POA: Insufficient documentation

## 2019-08-19 DIAGNOSIS — Z7189 Other specified counseling: Secondary | ICD-10-CM

## 2019-08-19 DIAGNOSIS — D649 Anemia, unspecified: Secondary | ICD-10-CM | POA: Diagnosis not present

## 2019-08-19 DIAGNOSIS — Z5112 Encounter for antineoplastic immunotherapy: Secondary | ICD-10-CM | POA: Diagnosis present

## 2019-08-19 LAB — CMP (CANCER CENTER ONLY)
ALT: 20 U/L (ref 0–44)
AST: 21 U/L (ref 15–41)
Albumin: 3.6 g/dL (ref 3.5–5.0)
Alkaline Phosphatase: 69 U/L (ref 38–126)
Anion gap: 9 (ref 5–15)
BUN: 20 mg/dL (ref 8–23)
CO2: 26 mmol/L (ref 22–32)
Calcium: 9 mg/dL (ref 8.9–10.3)
Chloride: 104 mmol/L (ref 98–111)
Creatinine: 0.94 mg/dL (ref 0.44–1.00)
GFR, Est AFR Am: >60 mL/min (ref >60)
GFR, Estimated: >60 mL/min (ref >60)
Glucose, Bld: 175 mg/dL — ABNORMAL HIGH (ref 70–99)
Potassium: 4.4 mmol/L (ref 3.5–5.1)
Sodium: 139 mmol/L (ref 135–145)
Total Bilirubin: 0.3 mg/dL (ref 0.3–1.2)
Total Protein: 7.2 g/dL (ref 6.5–8.1)

## 2019-08-19 LAB — CBC WITH DIFFERENTIAL (CANCER CENTER ONLY)
Abs Immature Granulocytes: 0.01 10*3/uL (ref 0.00–0.07)
Basophils Absolute: 0 10*3/uL (ref 0.0–0.1)
Basophils Relative: 1 %
Eosinophils Absolute: 0.1 10*3/uL (ref 0.0–0.5)
Eosinophils Relative: 2 %
HCT: 30.8 % — ABNORMAL LOW (ref 36.0–46.0)
Hemoglobin: 9.6 g/dL — ABNORMAL LOW (ref 12.0–15.0)
Immature Granulocytes: 0 %
Lymphocytes Relative: 26 %
Lymphs Abs: 1 10*3/uL (ref 0.7–4.0)
MCH: 31.7 pg (ref 26.0–34.0)
MCHC: 31.2 g/dL (ref 30.0–36.0)
MCV: 101.7 fL — ABNORMAL HIGH (ref 80.0–100.0)
Monocytes Absolute: 0.3 10*3/uL (ref 0.1–1.0)
Monocytes Relative: 8 %
Neutro Abs: 2.5 10*3/uL (ref 1.7–7.7)
Neutrophils Relative %: 63 %
Platelet Count: 208 10*3/uL (ref 150–400)
RBC: 3.03 MIL/uL — ABNORMAL LOW (ref 3.87–5.11)
RDW: 15.2 % (ref 11.5–15.5)
WBC Count: 4 10*3/uL (ref 4.0–10.5)
nRBC: 0 % (ref 0.0–0.2)

## 2019-08-19 LAB — IRON AND TIBC
Iron: 54 ug/dL (ref 41–142)
Saturation Ratios: 17 % — ABNORMAL LOW (ref 21–57)
TIBC: 321 ug/dL (ref 236–444)
UIBC: 267 ug/dL (ref 120–384)

## 2019-08-19 LAB — FERRITIN: Ferritin: 108 ng/mL (ref 11–307)

## 2019-08-19 LAB — TOTAL PROTEIN, URINE DIPSTICK

## 2019-08-19 LAB — VITAMIN B12: Vitamin B-12: 1363 pg/mL — ABNORMAL HIGH (ref 180–914)

## 2019-08-19 MED ORDER — SODIUM CHLORIDE 0.9 % IV SOLN
15.0000 mg/kg | Freq: Once | INTRAVENOUS | Status: AC
  Administered 2019-08-19: 10:00:00 1500 mg via INTRAVENOUS
  Filled 2019-08-19: qty 48

## 2019-08-19 MED ORDER — SODIUM CHLORIDE 0.9 % IV SOLN
Freq: Once | INTRAVENOUS | Status: AC
  Filled 2019-08-19: qty 250

## 2019-08-19 MED ORDER — SODIUM CHLORIDE 0.9% FLUSH
10.0000 mL | INTRAVENOUS | Status: DC | PRN
  Administered 2019-08-19: 10 mL
  Filled 2019-08-19: qty 10

## 2019-08-19 MED ORDER — SODIUM CHLORIDE 0.9% FLUSH
10.0000 mL | Freq: Once | INTRAVENOUS | Status: AC
  Administered 2019-08-19: 08:00:00 10 mL
  Filled 2019-08-19: qty 10

## 2019-08-19 MED ORDER — HEPARIN SOD (PORK) LOCK FLUSH 100 UNIT/ML IV SOLN
500.0000 [IU] | Freq: Once | INTRAVENOUS | Status: AC | PRN
  Administered 2019-08-19: 10:00:00 500 [IU]
  Filled 2019-08-19: qty 5

## 2019-08-19 NOTE — Progress Notes (Signed)
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 [facility]) I have reviewed her recent blood work and multiple CT imaging with the patient and her sister The patient has no evidence of disease recurrence She tolerated bevacizumab well with occasional intermittent proteinuria but her blood pressure control at home remains satisfactory She will continue treatment every 3 weeks as scheduled I plan to see her every other treatment Next month, due to the timing of her treatment, I will not see her at the end of March but will see her in April instead She agreed with the plan of care  Deficiency anemia She has persistent chronic anemia She is somewhat symptomatic with fatigue I have rechecked serum vitamin B12 and iron studies today The pattern seen is most consistent with anemia of chronic illness We will observe only for now There is no indication to prescribe blood transfusion  Essential hypertension Her blood pressure control at home is satisfactory She will continue twice a day blood pressure monitoring  Goals of care, counseling/discussion We discussed goals of care is to remain on bevacizumab indefinitely There is no contraindication for her to proceed with Covid vaccination   Orders Placed This Encounter  Procedures  . Ferritin    Standing Status:   Future    Number of Occurrences:   1    Standing Expiration Date:   08/18/2020  . Iron and TIBC    Standing Status:   Future    Number of Occurrences:   1    Standing Expiration Date:   09/22/2020  . Vitamin B12    Standing Status:   Future    Number of Occurrences:   1    Standing Expiration Date:   09/22/2020    All questions were answered. The patient knows to call the clinic with any problems, questions or concerns. The total time spent in the appointment was 30 minutes encounter with patients including  review of chart and various tests results, discussions about plan of care and coordination of care plan   Heath Lark, MD 08/19/2019 11:39 AM  INTERVAL HISTORY: Please see below for problem oriented charting. 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    03/20/2017 Imaging   GYN ultrasound was performed this revealed a small fundus measuring 5 cm there are 2 small fibroids within the fundus with the largest measuring 1.3 cm Within the endometrium there is a small amount a fluid the endometrial wall was slightly thickened at 4.8 mm the ultrasound was consistent with a small endometrial polyp  The right adnexa measured 7.4 x 6.7 cm contained a both a cystic and solid mass with a small amount of shadowing This is consistent with a dermoid tumor the left ovary was 2.6 x 1.5 x 1.0 cm There is no other adnexal masses no free fluid within the abdomen no ascites  04/10/2017 Surgery   Operation: Operative Laparscopy with  Peritoneal biopsy 2 hysteroscopy D&C Surgeon: Lynder Parents MD  Specimens: Peritoneal biopsy and cellular washings  Complications: None FIndings Small amount of ascites was found cellular washings were sent There was peritoneal studding And omental caking Even in steep Trendelenburg the ovaries and uterus were unable to be visualized due to the small intestines in the lower pelvis     04/10/2017 Pathology Results   Biopsy from [facility] was nondiagnostic.  Peritoneum biopsy did not reveal malignancy.  Endometrial biopsy also did not reveal malignancy.   04/13/2017 Tumor Marker   Patient's tumor was tested for the following markers: CA-125 Results of the tumor marker test revealed 324.1   04/20/2017 Tumor Marker   Patient's tumor was tested for the following markers: CA-125 Results of the tumor marker test revealed 345.6   04/22/2017 Pathology Results   Peritoneum, biopsy - CARCINOMA. - SEE COMMENT.   04/22/2017 Procedure   CT-guided core biopsy performed of peritoneal tumor in the left lower quadrant.   04/24/2017 - 09/25/2017 Chemotherapy   She received carboplatin and Taxol x 3 cycles followed by interval surgical debulking on 12/31. Chemotherapy was subsequently resumed for 3 more cycles   06/05/2017 Tumor Marker   Patient's tumor was tested for the following markers: CA-125 Results of the tumor marker test revealed 27.5   06/17/2017 Imaging   1. Considerable improvement in the prior omental caking, which now presents mainly as an indistinct stranding with slight nodularity along the left upper quadrant omentum. 2. Stable appearance of right ovarian mass with fatty and calcific elements classic for a dermoid. 3. There is accentuated enhancement in the walls of the common hepatic duct and common bile duct. A low-grade cholangitis is not readily excluded. 4. Other imaging findings of potential clinical significance: Mild cardiomegaly. Aortic Atherosclerosis (ICD10-I70.0). Lumbar scoliosis, spondylosis, degenerative disc disease, and pars defects at L5. These cause impingement at L5-S1 and lesser impingement at L3-4 and L4-5. 5. 65% compression fracture at L1 slightly worsened than on the prior exam, with associated vertebral sclerosis and posterior retropulsion.   07/06/2017 Surgery   Bilateral Salpingo-Oophorectomy W/Omentectomy, Total Abd Hysterectomy & Radical Dissection For Debulking Right  - Ureterolysis, With Or Without Repositioning Of Ureter For Retroperitoneal Fibrosis   07/06/2017 Pathology Results   A: Omentum, omentectomy - Omentum with minimal residual carcinoma, consistent with high grade serous carcinoma (microscopic; stage ypT3a) - Extensive treatment effect with calcifications, fibrosis, histiocytes, and giant cell response - CRS score 3  B: Uterus with cervix and left ovary and fallopian tube, hysterectomy and left salpingo-oophorectomy - Cervix: Ectocervix and endocervix with no dysplasia or malignancy identified - Endometrium: Inactive with no hyperplasia, atypia, or malignancy identified - Myometrium: Adenomyosis and benign leiomyoma, size 1.2 cm - Serosa: Focal calcification and histiocytes suggestive of treatment effect, with no viable carcinoma identified - Ovary, left: Physiologic changes and no carcinoma identified - Fallopian tube, left: 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 and no malignancy identified  C: Ovary and fallopian tube, right, salpingo-oophorectomy - Right fallopian tube with focal residual carcinoma, consistent with high grade serous carcinoma, and serous tubal intraepithelial carcinoma (STIC) - Associated calcifications and histiocytes suggestive of treatment response - Right ovary with calcifications and histiocytes suggestive of treatment effect, with no definite viable carcinoma identified - Mature cystic teratoma, 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 - See synoptic report and comment   08/14/2017 Tumor Marker   Patient's tumor was tested for the  following markers: CA-125 Results of the tumor marker test revealed 14.6   09/04/2017 Tumor Marker   Patient's tumor was tested for the following markers: CA-125 Results of the tumor marker test revealed 12.5   10/03/2017 Genetic Testing   The Common Hereditary Cancer Panel offered by [facility] includes sequencing and/or deletion duplication testing of the following 47 genes: APC, ATM, AXIN2, BARD1, BMPR1A,  BRCA1, BRCA2, BRIP1, CDH1, CDKN2A (p14ARF), CDKN2A (p16INK4a), CKD4, CHEK2, CTNNA1, DICER1, EPCAM (Deletion/duplication testing only), GREM1 (promoter region deletion/duplication testing only), KIT, MEN1, MLH1, MSH2, MSH3, MSH6, MUTYH, NBN, NF1, NHTL1, PALB2, PDGFRA, PMS2, POLD1, POLE, PTEN, RAD50, RAD51C, RAD51D, SDHB, SDHC, SDHD, SMAD4, SMARCA4. STK11, TP53, TSC1, TSC2, and VHL.  The following genes were evaluated for sequence changes only: SDHA and HOXB13 c.251G>A variant only.  Results: Negative, no pathogenic variants identified.  The date of this test report is 10/03/2017.    10/28/2017 Imaging   1. Stable pulmonary nodules. No new or progressive findings. 2. No residual or recurrent omental disease is identified. No obvious peritoneal surface disease. 3. Increasing soft tissue density and ill-defined interstitial changes around the abdominal wall surgical incision. This may be progressive postoperative changes or granulation tissue but attention on future scans to exclude the possibility of tumor. 4. Status post resection of right adnexal mass. No pelvic mass or pelvic adenopathy. 5. Stable T12 compression fracture.   01/25/2018 Tumor Marker   Patient's tumor was tested for the following markers: CA-125 Results of the tumor marker test revealed 9.2   12/10/2018 Tumor Marker   Patient's tumor was tested for the following markers: CA-125 Results of the tumor marker test revealed 212   12/22/2018 Imaging   1. Interval development of multiple soft tissue lesions in the gastrocolic ligament, mesentery, and along the peritoneal surface. These soft tissue lesions measure up to 6.5 cm in size. Associated interval development of moderate volume ascites in the abdomen and pelvis. Imaging features are consistent with metastatic disease. 2. Soft tissue projects through the rectus sheath in the right paraumbilical region, likely herniated small bowel as there is a large amount of decompressed small bowel  does deep to this finding. However, metastatic deposit at this location is also possibility.  3. Trace pneumobilia in the left liver suggests prior sphincterotomy. 4.  Aortic Atherosclerois (ICD10-170.0)   12/30/2018 Procedure   Successful ultrasound-guided therapeutic paracentesis yielding 4.5 liters of peritoneal fluid.   12/30/2018 Procedure   Technically successful right IJ power-injectable port catheter placement. Ready for routine use.   12/31/2018 -  Chemotherapy   The patient had carboplatin and taxol for chemotherapy treatment.     01/21/2019 Tumor Marker   Patient's tumor was tested for the following markers: CA-125 Results of the tumor marker test revealed 408   02/11/2019 Tumor Marker   Patient's tumor was tested for the following markers: CA-125 Results of the tumor marker test revealed 646.   03/02/2019 Imaging   1. Marked improvement in peritoneal carcinomatosis/ascites, without complete resolution. 2.  Aortic atherosclerosis (ICD10-170.0).     03/25/2019 Tumor Marker   Patient's tumor was tested for the following markers: CA-125 Results of the tumor marker test revealed 24.1.   04/15/2019 Tumor Marker   Patient's tumor was tested for the following markers: CA-125 Results of the tumor marker test revealed 15.3   05/18/2019 Imaging   1. There is progressive, nearly complete resolution of peritoneal and omental nodularity seen on prior examination with very faint residual peritoneal and omental haziness (series 2, image 50,  39).   2. Interval resolution of ascites.   3.  Status post hysterectomy and oophorectomy.   4.  Coronary artery disease.  Aortic Atherosclerosis (ICD10-I70.0).      Tumor Marker   Patient's tumor was tested for the following markers: CA-125 Results of the tumor marker test revealed 11.2.   06/10/2019 Tumor Marker   Patient's tumor was tested for the following markers: CA-125 Results of the tumor marker test revealed 9.1   07/29/2019 Tumor  Marker   Patient's tumor was tested for the following markers: CA-125 Results of the tumor marker test revealed 8.8   08/18/2019 Imaging   1. No evidence of new or progressive metastatic disease in the abdomen or pelvis. Stable small nodular soft tissue focus at the right umbilicus. No new peritoneal implants. No ascites. 2. Chronic findings include: Aortic Atherosclerosis (ICD10-I70.0). Coronary atherosclerosis. Chronic moderate T12 vertebral compression fractures.     REVIEW OF SYSTEMS:   Constitutional: Denies fevers, chills or abnormal weight loss Eyes: Denies blurriness of vision Ears, nose, mouth, throat, and face: Denies mucositis or sore throat Respiratory: Denies cough, dyspnea or wheezes Cardiovascular: Denies palpitation, chest discomfort or lower extremity swelling Gastrointestinal:  Denies nausea, heartburn or change in bowel habits Skin: Denies abnormal skin rashes Lymphatics: Denies new lymphadenopathy or easy bruising Neurological:Denies numbness, tingling or new weaknesses Behavioral/Psych: Mood is stable, no new changes  All other systems were reviewed with the patient and are negative.  I have reviewed the past medical history, past surgical history, social history and family history with the patient and they are unchanged from previous note.  ALLERGIES:  is allergic to aspirin; nsaids; and tramadol.  MEDICATIONS:  Current Outpatient Medications  Medication Sig Dispense Refill  . acetaminophen (TYLENOL) 650 MG CR tablet Take 1,300 mg by mouth every 8 (eight) hours.     Marland Kitchen alendronate (FOSAMAX) 70 MG tablet Take 70 mg by mouth every Sunday.     Marland Kitchen atorvastatin (LIPITOR) 80 MG tablet Take 40 mg by mouth at bedtime.     . Cholecalciferol (VITAMIN D3) 2000 units TABS Take 2,000 Units by mouth daily.    . citalopram (CELEXA) 10 MG tablet Take 10 mg by mouth at bedtime    . ferrous sulfate 325 (65 FE) MG tablet Take 325 mg by mouth daily. 3 hours after meal and 30 min  before a meal    . gabapentin (NEURONTIN) 300 MG capsule Take by mouth.    . Glucosamine Sulfate 500 MG TABS Take 500 mg by mouth 2 (two) times daily.     . hydrochlorothiazide (HYDRODIURIL) 25 MG tablet Take 1 tablet (25 mg total) by mouth daily. 90 tablet 3  . lidocaine-prilocaine (EMLA) cream Apply to affected area once 30 g 3  . loratadine (CLARITIN) 10 MG tablet Take 10 mg by mouth at bedtime.     Marland Kitchen losartan (COZAAR) 25 MG tablet Take 25 mg by mouth daily.     . meclizine (ANTIVERT) 25 MG tablet Take 25 mg by mouth 2 (two) times daily as needed for dizziness.     . metFORMIN (GLUCOPHAGE) 1000 MG tablet Take 1,000 mg by mouth 2 (two) times daily with a meal.     . omeprazole (PRILOSEC) 40 MG capsule Take 40 mg by mouth daily.     . ondansetron (ZOFRAN) 8 MG tablet Take 1 tablet (8 mg total) by mouth every 8 (eight) hours as needed for refractory nausea / vomiting. 30 tablet 1  . pioglitazone (ACTOS)  15 MG tablet Take 15 mg by mouth at bedtime.     . prochlorperazine (COMPAZINE) 10 MG tablet Take 1 tablet (10 mg total) by mouth every 6 (six) hours as needed (Nausea or vomiting). 30 tablet 1  . Pyridoxine HCl (VITAMIN B-6 PO) Take 50 mg by mouth 1 day or 1 dose.    . vitamin B-12 (CYANOCOBALAMIN) 1000 MCG tablet Take 1,000 mcg by mouth daily.      No current facility-administered medications for this visit.   Facility-Administered Medications Ordered in Other Visits  Medication Dose Route Frequency Provider Last Rate Last Admin  . sodium chloride flush (NS) 0.9 % injection 10 mL  10 mL Intracatheter Once [name], Shyquan Stallbaumer, MD      . sodium chloride flush (NS) 0.9 % injection 10 mL  10 mL Intracatheter PRN [name], Analee Montee, MD   10 mL at 08/19/19 1013    PHYSICAL EXAMINATION: ECOG PERFORMANCE STATUS: 1 - Symptomatic but completely ambulatory  Vitals:   08/19/19 0820  BP: (!) 121/52  Pulse: 79  Resp: 18  Temp: 98.5 F (36.9 C)  SpO2: 98%   Filed Weights   08/19/19 0820  Weight: 222 lb 3.2  oz (100.8 kg)    GENERAL:alert, no distress and comfortable Musculoskeletal:no cyanosis of digits and no clubbing  NEURO: alert & oriented x 3 with fluent speech, no focal motor/sensory deficits  LABORATORY DATA:  I have reviewed the data as listed    Component Value Date/Time   NA 139 08/19/2019 0810   NA 139 06/05/2017 0746   K 4.4 08/19/2019 0810   K 4.1 06/05/2017 0746   CL 104 08/19/2019 0810   CO2 26 08/19/2019 0810   CO2 21 (L) 06/05/2017 0746   GLUCOSE 175 (H) 08/19/2019 0810   GLUCOSE 279 (H) 06/05/2017 0746   BUN 20 08/19/2019 0810   BUN 15.0 06/05/2017 0746   CREATININE 0.94 08/19/2019 0810   CREATININE 0.9 06/05/2017 0746   CALCIUM 9.0 08/19/2019 0810   CALCIUM 10.1 06/05/2017 0746   PROT 7.2 08/19/2019 0810   PROT 8.1 06/05/2017 0746   ALBUMIN 3.6 08/19/2019 0810   ALBUMIN 4.0 06/05/2017 0746   AST 21 08/19/2019 0810   AST 22 06/05/2017 0746   ALT 20 08/19/2019 0810   ALT 36 06/05/2017 0746   ALKPHOS 69 08/19/2019 0810   ALKPHOS 98 06/05/2017 0746   BILITOT 0.3 08/19/2019 0810   BILITOT 0.44 06/05/2017 0746   GFRNONAA >60 08/19/2019 0810   GFRAA >60 08/19/2019 0810    No results found for: SPEP, UPEP  Lab Results  Component Value Date   WBC 4.0 08/19/2019   NEUTROABS 2.5 08/19/2019   HGB 9.6 (L) 08/19/2019   HCT 30.8 (L) 08/19/2019   MCV 101.7 (H) 08/19/2019   PLT 208 08/19/2019      Chemistry      Component Value Date/Time   NA 139 08/19/2019 0810   NA 139 06/05/2017 0746   K 4.4 08/19/2019 0810   K 4.1 06/05/2017 0746   CL 104 08/19/2019 0810   CO2 26 08/19/2019 0810   CO2 21 (L) 06/05/2017 0746   BUN 20 08/19/2019 0810   BUN 15.0 06/05/2017 0746   CREATININE 0.94 08/19/2019 0810   CREATININE 0.9 06/05/2017 0746      Component Value Date/Time   CALCIUM 9.0 08/19/2019 0810   CALCIUM 10.1 06/05/2017 0746   ALKPHOS 69 08/19/2019 0810   ALKPHOS 98 06/05/2017 0746   AST 21 08/19/2019 0810  AST 22 06/05/2017 0746   ALT 20  08/19/2019 0810   ALT 36 06/05/2017 0746   BILITOT 0.3 08/19/2019 0810   BILITOT 0.44 06/05/2017 0746       RADIOGRAPHIC STUDIES: I have reviewed multiple imaging studies with the patient and her husband I have personally reviewed the radiological images as listed and agreed with the findings in the report. CT Abdomen Pelvis W Contrast  Result Date: 08/18/2019 CLINICAL DATA:  Recurrent ovarian cancer. Ongoing immunotherapy. Restaging. History of neoadjuvant chemotherapy and surgical debulking with TAHBSO and omentectomy 07/06/2017. EXAM: CT ABDOMEN AND PELVIS WITH CONTRAST TECHNIQUE: Multidetector CT imaging of the abdomen and pelvis was performed using the standard protocol following bolus administration of intravenous contrast. CONTRAST:  164m OMNIPAQUE IOHEXOL 300 MG/ML  SOLN COMPARISON:  05/18/2019 CT abdomen/pelvis. FINDINGS: Lower chest: Anterior right middle lobe 5 mm solid pulmonary nodule (series 4/image 13), unchanged since 04/17/2017 chest CT, considered benign. No acute abnormality at the lung bases. Tip of superior approach central venous catheter seen in the right atrium. Coronary atherosclerosis. Hepatobiliary: Normal liver size. No liver mass. Cholecystectomy. Bile ducts are stable and within normal post cholecystectomy limits with minimal intrahepatic biliary ductal dilatation and CBD diameter 5 mm. Pancreas: Normal, with no mass or duct dilation. Spleen: Normal size. No mass. Adrenals/Urinary Tract: Normal adrenals. No hydronephrosis. No renal masses. Normal nondistended bladder. Stomach/Bowel: Normal non-distended stomach. Normal caliber small bowel with no small bowel wall thickening. Normal appendix. Oral contrast transits to the colon. No large bowel wall thickening, acute pericolonic fat stranding or significant colonic diverticulosis. Vascular/Lymphatic: Atherosclerotic nonaneurysmal abdominal aorta. Patent portal, splenic, hepatic and renal veins. No pathologically enlarged  lymph nodes in the abdomen or pelvis. Reproductive: Status post hysterectomy, with no abnormal findings at the vaginal cuff. No adnexal mass. Other: No pneumoperitoneum, ascites or focal fluid collection. Nodular 1.3 x 0.9 cm soft tissue focus at the right umbilicus (series 2/image 73), previously 1.5 x 0.7 cm, not appreciably changed. No new or enlarging peritoneal implants. Musculoskeletal: No aggressive appearing focal osseous lesions. Chronic moderate T12 vertebral compression fractures unchanged. Marked thoracolumbar spondylosis. IMPRESSION: 1. No evidence of new or progressive metastatic disease in the abdomen or pelvis. Stable small nodular soft tissue focus at the right umbilicus. No new peritoneal implants. No ascites. 2. Chronic findings include: Aortic Atherosclerosis (ICD10-I70.0). Coronary atherosclerosis. Chronic moderate T12 vertebral compression fractures. Electronically Signed   By: JIlona SorrelM.D.   On: 08/18/2019 14:25

## 2019-08-19 NOTE — Telephone Encounter (Signed)
-----   Message from Heath Lark, MD sent at 08/19/2019 11:31 AM EST ----- Regarding: iron studies and B12 Pls let her know her iron studies and B12 are adequate The cause of anemia is most likely anemia of chronic disease Nothing else needs to be done

## 2019-08-19 NOTE — Assessment & Plan Note (Signed)
She has persistent chronic anemia She is somewhat symptomatic with fatigue I have rechecked serum vitamin B12 and iron studies today The pattern seen is most consistent with anemia of chronic illness We will observe only for now There is no indication to prescribe blood transfusion

## 2019-08-19 NOTE — Assessment & Plan Note (Signed)
Her blood pressure control at home is satisfactory She will continue twice a day blood pressure monitoring

## 2019-08-19 NOTE — Patient Instructions (Signed)
Bevacizumab injection What is this medicine? BEVACIZUMAB (be va SIZ yoo mab) is a monoclonal antibody. It is used to treat many types of cancer. This medicine may be used for other purposes; ask your health care provider or pharmacist if you have questions. COMMON BRAND NAME(S): Avastin, MVASI, Zirabev What should I tell my health care provider before I take this medicine? They need to know if you have any of these conditions:  diabetes  heart disease  high blood pressure  history of coughing up blood  prior anthracycline chemotherapy (e.g., doxorubicin, daunorubicin, epirubicin)  recent or ongoing radiation therapy  recent or planning to have surgery  stroke  an unusual or allergic reaction to bevacizumab, hamster proteins, mouse proteins, other medicines, foods, dyes, or preservatives  pregnant or trying to get pregnant  breast-feeding How should I use this medicine? This medicine is for infusion into a vein. It is given by a health care professional in a hospital or clinic setting. Talk to your pediatrician regarding the use of this medicine in children. Special care may be needed. Overdosage: If you think you have taken too much of this medicine contact a poison control center or emergency room at once. NOTE: This medicine is only for you. Do not share this medicine with others. What if I miss a dose? It is important not to miss your dose. Call your doctor or health care professional if you are unable to keep an appointment. What may interact with this medicine? Interactions are not expected. This list may not describe all possible interactions. Give your health care provider a list of all the medicines, herbs, non-prescription drugs, or dietary supplements you use. Also tell them if you smoke, drink alcohol, or use illegal drugs. Some items may interact with your medicine. What should I watch for while using this medicine? Your condition will be monitored carefully while  you are receiving this medicine. You will need important blood work and urine testing done while you are taking this medicine. This medicine may increase your risk to bruise or bleed. Call your doctor or health care professional if you notice any unusual bleeding. This medicine should be started at least 28 days following major surgery and the site of the surgery should be totally healed. Check with your doctor before scheduling dental work or surgery while you are receiving this treatment. Talk to your doctor if you have recently had surgery or if you have a wound that has not healed. Do not become pregnant while taking this medicine or for 6 months after stopping it. Women should inform their doctor if they wish to become pregnant or think they might be pregnant. There is a potential for serious side effects to an unborn child. Talk to your health care professional or pharmacist for more information. Do not breast-feed an infant while taking this medicine and for 6 months after the last dose. This medicine has caused ovarian failure in some women. This medicine may interfere with the ability to have a child. You should talk to your doctor or health care professional if you are concerned about your fertility. What side effects may I notice from receiving this medicine? Side effects that you should report to your doctor or health care professional as soon as possible:  allergic reactions like skin rash, itching or hives, swelling of the face, lips, or tongue  chest pain or chest tightness  chills  coughing up blood  high fever  seizures  severe constipation  signs and symptoms   of bleeding such as bloody or black, tarry stools; red or dark-brown urine; spitting up blood or brown material that looks like coffee grounds; red spots on the skin; unusual bruising or bleeding from the eye, gums, or nose  signs and symptoms of a blood clot such as breathing problems; chest pain; severe, sudden  headache; pain, swelling, warmth in the leg  signs and symptoms of a stroke like changes in vision; confusion; trouble speaking or understanding; severe headaches; sudden numbness or weakness of the face, arm or leg; trouble walking; dizziness; loss of balance or coordination  stomach pain  sweating  swelling of legs or ankles  vomiting  weight gain Side effects that usually do not require medical attention (report to your doctor or health care professional if they continue or are bothersome):  back pain  changes in taste  decreased appetite  dry skin  nausea  tiredness This list may not describe all possible side effects. Call your doctor for medical advice about side effects. You may report side effects to FDA at 1-800-FDA-1088. Where should I keep my medicine? This drug is given in a hospital or clinic and will not be stored at home. NOTE: This sheet is a summary. It may not cover all possible information. If you have questions about this medicine, talk to your doctor, pharmacist, or health care provider.  2020 [employer] Standard (2016-06-20 14:33:29)  

## 2019-08-19 NOTE — Assessment & Plan Note (Signed)
We discussed goals of care is to remain on bevacizumab indefinitely There is no contraindication for her to proceed with Covid vaccination

## 2019-08-19 NOTE — Telephone Encounter (Signed)
Called and given below message. She verbalized understanding. 

## 2019-08-19 NOTE — Assessment & Plan Note (Signed)
I have reviewed her recent blood work and multiple CT imaging with the patient and her sister The patient has no evidence of disease recurrence She tolerated bevacizumab well with occasional intermittent proteinuria but her blood pressure control at home remains satisfactory She will continue treatment every 3 weeks as scheduled I plan to see her every other treatment Next month, due to the timing of her treatment, I will not see her at the end of March but will see her in April instead She agreed with the plan of care

## 2019-08-23 ENCOUNTER — Telehealth: Payer: Self-pay | Admitting: Hematology and Oncology

## 2019-08-23 NOTE — Telephone Encounter (Signed)
Scheduled appt per 2/12 sch message - pt is aware of appt date and time   

## 2019-09-09 ENCOUNTER — Inpatient Hospital Stay: Payer: Medicare Other

## 2019-09-09 ENCOUNTER — Ambulatory Visit: Payer: Medicare Other

## 2019-09-09 ENCOUNTER — Other Ambulatory Visit: Payer: Medicare Other

## 2019-09-09 ENCOUNTER — Other Ambulatory Visit: Payer: Self-pay

## 2019-09-09 ENCOUNTER — Inpatient Hospital Stay: Payer: Medicare Other | Attending: Gynecology

## 2019-09-09 VITALS — BP 129/71 | HR 75 | Temp 98.3°F | Resp 18 | Wt 217.2 lb

## 2019-09-09 DIAGNOSIS — C561 Malignant neoplasm of right ovary: Secondary | ICD-10-CM

## 2019-09-09 DIAGNOSIS — Z5112 Encounter for antineoplastic immunotherapy: Secondary | ICD-10-CM | POA: Diagnosis not present

## 2019-09-09 DIAGNOSIS — Z7189 Other specified counseling: Secondary | ICD-10-CM

## 2019-09-09 LAB — CBC WITH DIFFERENTIAL (CANCER CENTER ONLY)
Abs Immature Granulocytes: 0.01 10*3/uL (ref 0.00–0.07)
Basophils Absolute: 0 10*3/uL (ref 0.0–0.1)
Basophils Relative: 1 %
Eosinophils Absolute: 0.1 10*3/uL (ref 0.0–0.5)
Eosinophils Relative: 2 %
HCT: 32.1 % — ABNORMAL LOW (ref 36.0–46.0)
Hemoglobin: 9.7 g/dL — ABNORMAL LOW (ref 12.0–15.0)
Immature Granulocytes: 0 %
Lymphocytes Relative: 31 %
Lymphs Abs: 1.2 10*3/uL (ref 0.7–4.0)
MCH: 30.6 pg (ref 26.0–34.0)
MCHC: 30.2 g/dL (ref 30.0–36.0)
MCV: 101.3 fL — ABNORMAL HIGH (ref 80.0–100.0)
Monocytes Absolute: 0.3 10*3/uL (ref 0.1–1.0)
Monocytes Relative: 7 %
Neutro Abs: 2.3 10*3/uL (ref 1.7–7.7)
Neutrophils Relative %: 59 %
Platelet Count: 212 10*3/uL (ref 150–400)
RBC: 3.17 MIL/uL — ABNORMAL LOW (ref 3.87–5.11)
RDW: 15.6 % — ABNORMAL HIGH (ref 11.5–15.5)
WBC Count: 4 10*3/uL (ref 4.0–10.5)
nRBC: 0 % (ref 0.0–0.2)

## 2019-09-09 LAB — CMP (CANCER CENTER ONLY)
ALT: 20 U/L (ref 0–44)
AST: 21 U/L (ref 15–41)
Albumin: 3.6 g/dL (ref 3.5–5.0)
Alkaline Phosphatase: 68 U/L (ref 38–126)
Anion gap: 8 (ref 5–15)
BUN: 28 mg/dL — ABNORMAL HIGH (ref 8–23)
CO2: 25 mmol/L (ref 22–32)
Calcium: 8.5 mg/dL — ABNORMAL LOW (ref 8.9–10.3)
Chloride: 105 mmol/L (ref 98–111)
Creatinine: 0.97 mg/dL (ref 0.44–1.00)
GFR, Est AFR Am: >60 mL/min (ref >60)
GFR, Estimated: >60 mL/min (ref >60)
Glucose, Bld: 160 mg/dL — ABNORMAL HIGH (ref 70–99)
Potassium: 3.9 mmol/L (ref 3.5–5.1)
Sodium: 138 mmol/L (ref 135–145)
Total Bilirubin: 0.3 mg/dL (ref 0.3–1.2)
Total Protein: 7.1 g/dL (ref 6.5–8.1)

## 2019-09-09 LAB — TOTAL PROTEIN, URINE DIPSTICK

## 2019-09-09 MED ORDER — SODIUM CHLORIDE 0.9 % IV SOLN
15.0000 mg/kg | Freq: Once | INTRAVENOUS | Status: AC
  Administered 2019-09-09: 09:00:00 1500 mg via INTRAVENOUS
  Filled 2019-09-09: qty 48

## 2019-09-09 MED ORDER — HEPARIN SOD (PORK) LOCK FLUSH 100 UNIT/ML IV SOLN
500.0000 [IU] | Freq: Once | INTRAVENOUS | Status: AC | PRN
  Administered 2019-09-09: 500 [IU]
  Filled 2019-09-09: qty 5

## 2019-09-09 MED ORDER — SODIUM CHLORIDE 0.9 % IV SOLN
Freq: Once | INTRAVENOUS | Status: AC
  Filled 2019-09-09: qty 250

## 2019-09-09 MED ORDER — SODIUM CHLORIDE 0.9% FLUSH
10.0000 mL | Freq: Once | INTRAVENOUS | Status: AC
  Administered 2019-09-09: 10 mL
  Filled 2019-09-09: qty 10

## 2019-09-09 MED ORDER — SODIUM CHLORIDE 0.9% FLUSH
10.0000 mL | INTRAVENOUS | Status: DC | PRN
  Administered 2019-09-09: 10 mL
  Filled 2019-09-09: qty 10

## 2019-09-09 NOTE — Patient Instructions (Signed)
[facility] Discharge Instructions for Patients Receiving Chemotherapy  Today you received the following chemotherapy agents: bevacizumab  To help prevent nausea and vomiting after your treatment, we encourage you to take your nausea medication as directed.   If you develop nausea and vomiting that is not controlled by your nausea medication, call the clinic.   BELOW ARE SYMPTOMS THAT SHOULD BE REPORTED IMMEDIATELY:  *FEVER GREATER THAN 100.5 F  *CHILLS WITH OR WITHOUT FEVER  NAUSEA AND VOMITING THAT IS NOT CONTROLLED WITH YOUR NAUSEA MEDICATION  *UNUSUAL SHORTNESS OF BREATH  *UNUSUAL BRUISING OR BLEEDING  TENDERNESS IN MOUTH AND THROAT WITH OR WITHOUT PRESENCE OF ULCERS  *URINARY PROBLEMS  *BOWEL PROBLEMS  UNUSUAL RASH Items with * indicate a potential emergency and should be followed up as soon as possible.  Feel free to call the clinic should you have any questions or concerns. The clinic phone number is [phone].  Please show the CHEMO ALERT CARD at check-in to the Emergency Department and triage nurse.   

## 2019-09-09 NOTE — Patient Instructions (Signed)

## 2019-09-10 LAB — CA 125: Cancer Antigen (CA) 125: 11.4 U/mL (ref 0.0–38.1)

## 2019-09-26 NOTE — Progress Notes (Signed)
Pharmacist Chemotherapy Monitoring - Follow Up Assessment    I verify that I have reviewed each item in the below checklist:  . Regimen for the patient is scheduled for the appropriate day and plan matches scheduled date. Marland Kitchen Appropriate non-routine labs are ordered dependent on drug ordered. . If applicable, additional medications reviewed and ordered per protocol based on lifetime cumulative doses and/or treatment regimen.   Plan for follow-up and/or issues identified: No . I-vent associated with next due treatment: No . MD and/or nursing notified: No  Cheryl Melton [facility] 09/26/2019 11:04 AM

## 2019-09-30 ENCOUNTER — Other Ambulatory Visit: Payer: Self-pay

## 2019-09-30 ENCOUNTER — Inpatient Hospital Stay: Payer: Medicare Other

## 2019-09-30 VITALS — BP 137/62 | HR 78 | Temp 98.7°F | Resp 18 | Ht 67.0 in | Wt 219.8 lb

## 2019-09-30 DIAGNOSIS — C561 Malignant neoplasm of right ovary: Secondary | ICD-10-CM

## 2019-09-30 DIAGNOSIS — Z7189 Other specified counseling: Secondary | ICD-10-CM

## 2019-09-30 DIAGNOSIS — Z5112 Encounter for antineoplastic immunotherapy: Secondary | ICD-10-CM | POA: Diagnosis not present

## 2019-09-30 LAB — CMP (CANCER CENTER ONLY)
ALT: 19 U/L (ref 0–44)
AST: 23 U/L (ref 15–41)
Albumin: 3.7 g/dL (ref 3.5–5.0)
Alkaline Phosphatase: 56 U/L (ref 38–126)
Anion gap: 11 (ref 5–15)
BUN: 21 mg/dL (ref 8–23)
CO2: 25 mmol/L (ref 22–32)
Calcium: 9.2 mg/dL (ref 8.9–10.3)
Chloride: 103 mmol/L (ref 98–111)
Creatinine: 0.93 mg/dL (ref 0.44–1.00)
GFR, Est AFR Am: >60 mL/min (ref >60)
GFR, Estimated: >60 mL/min (ref >60)
Glucose, Bld: 136 mg/dL — ABNORMAL HIGH (ref 70–99)
Potassium: 4 mmol/L (ref 3.5–5.1)
Sodium: 139 mmol/L (ref 135–145)
Total Bilirubin: 0.3 mg/dL (ref 0.3–1.2)
Total Protein: 7.2 g/dL (ref 6.5–8.1)

## 2019-09-30 LAB — CBC WITH DIFFERENTIAL (CANCER CENTER ONLY)
Abs Immature Granulocytes: 0.01 10*3/uL (ref 0.00–0.07)
Basophils Absolute: 0 10*3/uL (ref 0.0–0.1)
Basophils Relative: 1 %
Eosinophils Absolute: 0.1 10*3/uL (ref 0.0–0.5)
Eosinophils Relative: 2 %
HCT: 31.9 % — ABNORMAL LOW (ref 36.0–46.0)
Hemoglobin: 9.9 g/dL — ABNORMAL LOW (ref 12.0–15.0)
Immature Granulocytes: 0 %
Lymphocytes Relative: 29 %
Lymphs Abs: 1.1 10*3/uL (ref 0.7–4.0)
MCH: 30.5 pg (ref 26.0–34.0)
MCHC: 31 g/dL (ref 30.0–36.0)
MCV: 98.2 fL (ref 80.0–100.0)
Monocytes Absolute: 0.3 10*3/uL (ref 0.1–1.0)
Monocytes Relative: 7 %
Neutro Abs: 2.3 10*3/uL (ref 1.7–7.7)
Neutrophils Relative %: 61 %
Platelet Count: 203 10*3/uL (ref 150–400)
RBC: 3.25 MIL/uL — ABNORMAL LOW (ref 3.87–5.11)
RDW: 15.5 % (ref 11.5–15.5)
WBC Count: 3.8 10*3/uL — ABNORMAL LOW (ref 4.0–10.5)
nRBC: 0 % (ref 0.0–0.2)

## 2019-09-30 LAB — TOTAL PROTEIN, URINE DIPSTICK: Protein, ur: NEGATIVE mg/dL

## 2019-09-30 MED ORDER — HEPARIN SOD (PORK) LOCK FLUSH 100 UNIT/ML IV SOLN
500.0000 [IU] | Freq: Once | INTRAVENOUS | Status: AC | PRN
  Administered 2019-09-30: 500 [IU]
  Filled 2019-09-30: qty 5

## 2019-09-30 MED ORDER — HEPARIN SOD (PORK) LOCK FLUSH 100 UNIT/ML IV SOLN
500.0000 [IU] | Freq: Once | INTRAVENOUS | Status: DC
  Filled 2019-09-30: qty 5

## 2019-09-30 MED ORDER — SODIUM CHLORIDE 0.9% FLUSH
10.0000 mL | Freq: Once | INTRAVENOUS | Status: AC
  Administered 2019-09-30: 10 mL
  Filled 2019-09-30: qty 10

## 2019-09-30 MED ORDER — SODIUM CHLORIDE 0.9 % IV SOLN
15.0000 mg/kg | Freq: Once | INTRAVENOUS | Status: AC
  Administered 2019-09-30: 1500 mg via INTRAVENOUS
  Filled 2019-09-30: qty 48

## 2019-09-30 MED ORDER — SODIUM CHLORIDE 0.9% FLUSH
10.0000 mL | INTRAVENOUS | Status: DC | PRN
  Administered 2019-09-30: 10 mL
  Filled 2019-09-30: qty 10

## 2019-09-30 MED ORDER — SODIUM CHLORIDE 0.9 % IV SOLN
Freq: Once | INTRAVENOUS | Status: AC
  Filled 2019-09-30: qty 250

## 2019-09-30 NOTE — Patient Instructions (Signed)
[facility] Discharge Instructions for Patients Receiving Chemotherapy  Today you received the following chemotherapy agents: bevacizumab  To help prevent nausea and vomiting after your treatment, we encourage you to take your nausea medication as directed.   If you develop nausea and vomiting that is not controlled by your nausea medication, call the clinic.   BELOW ARE SYMPTOMS THAT SHOULD BE REPORTED IMMEDIATELY:  *FEVER GREATER THAN 100.5 F  *CHILLS WITH OR WITHOUT FEVER  NAUSEA AND VOMITING THAT IS NOT CONTROLLED WITH YOUR NAUSEA MEDICATION  *UNUSUAL SHORTNESS OF BREATH  *UNUSUAL BRUISING OR BLEEDING  TENDERNESS IN MOUTH AND THROAT WITH OR WITHOUT PRESENCE OF ULCERS  *URINARY PROBLEMS  *BOWEL PROBLEMS  UNUSUAL RASH Items with * indicate a potential emergency and should be followed up as soon as possible.  Feel free to call the clinic should you have any questions or concerns. The clinic phone number is [phone].  Please show the CHEMO ALERT CARD at check-in to the Emergency Department and triage nurse.   

## 2019-10-17 NOTE — Progress Notes (Signed)
Pharmacist Chemotherapy Monitoring - Follow Up Assessment    I verify that I have reviewed each item in the below checklist:  . Regimen for the patient is scheduled for the appropriate day and plan matches scheduled date. Marland Kitchen Appropriate non-routine labs are ordered dependent on drug ordered. . If applicable, additional medications reviewed and ordered per protocol based on lifetime cumulative doses and/or treatment regimen.   Plan for follow-up and/or issues identified: No . I-vent associated with next due treatment: No . MD and/or nursing notified: No  Cheryl Melton 10/17/2019 2:36 PM

## 2019-10-21 ENCOUNTER — Inpatient Hospital Stay (HOSPITAL_BASED_OUTPATIENT_CLINIC_OR_DEPARTMENT_OTHER): Payer: Medicare Other | Admitting: Hematology and Oncology

## 2019-10-21 ENCOUNTER — Inpatient Hospital Stay: Payer: Medicare Other

## 2019-10-21 ENCOUNTER — Other Ambulatory Visit: Payer: Medicare Other

## 2019-10-21 ENCOUNTER — Ambulatory Visit: Payer: Medicare Other

## 2019-10-21 ENCOUNTER — Other Ambulatory Visit: Payer: Self-pay

## 2019-10-21 ENCOUNTER — Ambulatory Visit: Payer: Medicare Other | Admitting: Hematology and Oncology

## 2019-10-21 ENCOUNTER — Inpatient Hospital Stay: Payer: Medicare Other | Attending: Gynecology

## 2019-10-21 ENCOUNTER — Encounter: Payer: Self-pay | Admitting: Hematology and Oncology

## 2019-10-21 DIAGNOSIS — Z5112 Encounter for antineoplastic immunotherapy: Secondary | ICD-10-CM | POA: Insufficient documentation

## 2019-10-21 DIAGNOSIS — D539 Nutritional anemia, unspecified: Secondary | ICD-10-CM | POA: Diagnosis not present

## 2019-10-21 DIAGNOSIS — I10 Essential (primary) hypertension: Secondary | ICD-10-CM | POA: Diagnosis not present

## 2019-10-21 DIAGNOSIS — Z2821 Immunization not carried out because of patient refusal: Secondary | ICD-10-CM | POA: Diagnosis not present

## 2019-10-21 DIAGNOSIS — C561 Malignant neoplasm of right ovary: Secondary | ICD-10-CM

## 2019-10-21 DIAGNOSIS — Z7189 Other specified counseling: Secondary | ICD-10-CM

## 2019-10-21 LAB — CBC WITH DIFFERENTIAL (CANCER CENTER ONLY)
Abs Immature Granulocytes: 0.01 10*3/uL (ref 0.00–0.07)
Basophils Absolute: 0 10*3/uL (ref 0.0–0.1)
Basophils Relative: 1 %
Eosinophils Absolute: 0.1 10*3/uL (ref 0.0–0.5)
Eosinophils Relative: 2 %
HCT: 31.6 % — ABNORMAL LOW (ref 36.0–46.0)
Hemoglobin: 9.6 g/dL — ABNORMAL LOW (ref 12.0–15.0)
Immature Granulocytes: 0 %
Lymphocytes Relative: 29 %
Lymphs Abs: 1.1 10*3/uL (ref 0.7–4.0)
MCH: 30.7 pg (ref 26.0–34.0)
MCHC: 30.4 g/dL (ref 30.0–36.0)
MCV: 101 fL — ABNORMAL HIGH (ref 80.0–100.0)
Monocytes Absolute: 0.3 10*3/uL (ref 0.1–1.0)
Monocytes Relative: 8 %
Neutro Abs: 2.4 10*3/uL (ref 1.7–7.7)
Neutrophils Relative %: 60 %
Platelet Count: 215 10*3/uL (ref 150–400)
RBC: 3.13 MIL/uL — ABNORMAL LOW (ref 3.87–5.11)
RDW: 16 % — ABNORMAL HIGH (ref 11.5–15.5)
WBC Count: 4 10*3/uL (ref 4.0–10.5)
nRBC: 0 % (ref 0.0–0.2)

## 2019-10-21 LAB — CMP (CANCER CENTER ONLY)
ALT: 20 U/L (ref 0–44)
AST: 23 U/L (ref 15–41)
Albumin: 3.5 g/dL (ref 3.5–5.0)
Alkaline Phosphatase: 62 U/L (ref 38–126)
Anion gap: 12 (ref 5–15)
BUN: 19 mg/dL (ref 8–23)
CO2: 25 mmol/L (ref 22–32)
Calcium: 8.2 mg/dL — ABNORMAL LOW (ref 8.9–10.3)
Chloride: 105 mmol/L (ref 98–111)
Creatinine: 0.91 mg/dL (ref 0.44–1.00)
GFR, Est AFR Am: >60 mL/min (ref >60)
GFR, Estimated: >60 mL/min (ref >60)
Glucose, Bld: 107 mg/dL — ABNORMAL HIGH (ref 70–99)
Potassium: 4 mmol/L (ref 3.5–5.1)
Sodium: 142 mmol/L (ref 135–145)
Total Bilirubin: 0.3 mg/dL (ref 0.3–1.2)
Total Protein: 7 g/dL (ref 6.5–8.1)

## 2019-10-21 LAB — TOTAL PROTEIN, URINE DIPSTICK: Protein, ur: NEGATIVE mg/dL

## 2019-10-21 MED ORDER — SODIUM CHLORIDE 0.9 % IV SOLN
Freq: Once | INTRAVENOUS | Status: AC
  Filled 2019-10-21: qty 250

## 2019-10-21 MED ORDER — SODIUM CHLORIDE 0.9% FLUSH
10.0000 mL | INTRAVENOUS | Status: DC | PRN
  Administered 2019-10-21: 10 mL
  Filled 2019-10-21: qty 10

## 2019-10-21 MED ORDER — SODIUM CHLORIDE 0.9 % IV SOLN
15.0000 mg/kg | Freq: Once | INTRAVENOUS | Status: AC
  Administered 2019-10-21: 10:00:00 1500 mg via INTRAVENOUS
  Filled 2019-10-21: qty 48

## 2019-10-21 MED ORDER — HEPARIN SOD (PORK) LOCK FLUSH 100 UNIT/ML IV SOLN
500.0000 [IU] | Freq: Once | INTRAVENOUS | Status: AC | PRN
  Administered 2019-10-21: 11:00:00 500 [IU]
  Filled 2019-10-21: qty 5

## 2019-10-21 MED ORDER — SODIUM CHLORIDE 0.9% FLUSH
10.0000 mL | Freq: Once | INTRAVENOUS | Status: AC
  Administered 2019-10-21: 08:00:00 10 mL
  Filled 2019-10-21: qty 10

## 2019-10-21 NOTE — Patient Instructions (Signed)
[facility] Discharge Instructions for Patients Receiving Chemotherapy  Today you received the following chemotherapy agents: bevacizumab  To help prevent nausea and vomiting after your treatment, we encourage you to take your nausea medication as directed.   If you develop nausea and vomiting that is not controlled by your nausea medication, call the clinic.   BELOW ARE SYMPTOMS THAT SHOULD BE REPORTED IMMEDIATELY:  *FEVER GREATER THAN 100.5 F  *CHILLS WITH OR WITHOUT FEVER  NAUSEA AND VOMITING THAT IS NOT CONTROLLED WITH YOUR NAUSEA MEDICATION  *UNUSUAL SHORTNESS OF BREATH  *UNUSUAL BRUISING OR BLEEDING  TENDERNESS IN MOUTH AND THROAT WITH OR WITHOUT PRESENCE OF ULCERS  *URINARY PROBLEMS  *BOWEL PROBLEMS  UNUSUAL RASH Items with * indicate a potential emergency and should be followed up as soon as possible.  Feel free to call the clinic should you have any questions or concerns. The clinic phone number is [phone].  Please show the CHEMO ALERT CARD at check-in to the Emergency Department and triage nurse.   

## 2019-10-21 NOTE — Assessment & Plan Note (Signed)
Her last CT imaging in February showed no evidence of disease recurrence Her last tumor marker is stable She tolerated bevacizumab well with occasional intermittent proteinuria but her blood pressure control at home remains satisfactory She will continue treatment every 3 weeks as scheduled I plan to see her every other treatment We discussed the role of imaging study I would like to defer imaging study down the road if she have no symptoms and with stability of her tumor markers She agreed with the plan of care

## 2019-10-21 NOTE — Assessment & Plan Note (Signed)
She has numerous questions about social distancing and what she can or cannot do We discussed the role of Covid vaccination but she is undecided and declined vaccination for now

## 2019-10-21 NOTE — Assessment & Plan Note (Signed)
Her blood pressure control at home is satisfactory She will continue twice a day blood pressure monitoring

## 2019-10-21 NOTE — Assessment & Plan Note (Addendum)
She has persistent chronic anemia She is somewhat symptomatic with fatigue The pattern seen is most consistent with anemia of chronic illness Recent serum vitamin B12 and iron studies are adequate We will observe only for now

## 2019-10-21 NOTE — Patient Instructions (Signed)

## 2019-10-21 NOTE — Progress Notes (Signed)
[place] OFFICE PROGRESS NOTE  Patient Care Team: Lilian Coma., MD as PCP - General (Internal Medicine)  ASSESSMENT & PLAN:  Ovarian cancer on right [facility]) Her last CT imaging in February showed no evidence of disease recurrence Her last tumor marker is stable She tolerated bevacizumab well with occasional intermittent proteinuria but her blood pressure control at home remains satisfactory She will continue treatment every 3 weeks as scheduled I plan to see her every other treatment We discussed the role of imaging study I would like to defer imaging study down the road if she have no symptoms and with stability of her tumor markers She agreed with the plan of care  Deficiency anemia She has persistent chronic anemia She is somewhat symptomatic with fatigue The pattern seen is most consistent with anemia of chronic illness Recent serum vitamin B12 and iron studies are adequate We will observe only for now  Essential hypertension Her blood pressure control at home is satisfactory She will continue twice a day blood pressure monitoring  COVID-19 virus vaccination declined She has numerous questions about social distancing and what she can or cannot do We discussed the role of Covid vaccination but she is undecided and declined vaccination for now   No orders of the defined types were placed in this encounter.   All questions were answered. The patient knows to call the clinic with any problems, questions or concerns. The total time spent in the appointment was 20 minutes encounter with patients including review of chart and various tests results, discussions about plan of care and coordination of care plan   Heath Lark, MD 10/21/2019 9:33 AM  INTERVAL HISTORY: Please see below for problem oriented charting. 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    03/20/2017 Imaging   GYN ultrasound was performed this revealed a small fundus measuring 5 cm there are 2 small fibroids within the fundus with the largest measuring 1.3 cm Within the endometrium there is a small amount a fluid the endometrial wall was slightly thickened at 4.8 mm the ultrasound was consistent with a small endometrial polyp  The right adnexa measured 7.4 x 6.7 cm contained a both a cystic and solid mass with a small amount of shadowing This is consistent with a dermoid tumor the left ovary was 2.6 x 1.5 x 1.0 cm There is no other adnexal masses no free fluid within the abdomen no ascites     04/10/2017 Surgery   Operation: Operative Laparscopy with  Peritoneal biopsy 2 hysteroscopy D&C Surgeon: Lynder Parents MD  Specimens: Peritoneal biopsy and cellular washings  Complications: None FIndings Small amount of ascites was found cellular washings were sent There was peritoneal  studding And omental caking Even in steep Trendelenburg the ovaries and uterus were unable to be visualized due to the small intestines in the lower pelvis     04/10/2017 Pathology Results   Biopsy from [facility] was nondiagnostic.  Peritoneum biopsy did not reveal malignancy.  Endometrial biopsy also  did not reveal malignancy.   04/13/2017 Tumor Marker   Patient's tumor was tested for the following markers: CA-125 Results of the tumor marker test revealed 324.1   04/20/2017 Tumor Marker   Patient's tumor was tested for the following markers: CA-125 Results of the tumor marker test revealed 345.6   04/22/2017 Pathology Results   Peritoneum, biopsy - CARCINOMA. - SEE COMMENT.   04/22/2017 Procedure   CT-guided core biopsy performed of peritoneal tumor in the left lower quadrant.   04/24/2017 - 09/25/2017 Chemotherapy   She received carboplatin and Taxol x 3 cycles followed by interval surgical debulking on 12/31. Chemotherapy was subsequently resumed for 3 more cycles   06/05/2017 Tumor Marker   Patient's tumor was tested for the following markers: CA-125 Results of the tumor marker test revealed 27.5   06/17/2017 Imaging   1. Considerable improvement in the prior omental caking, which now presents mainly as an indistinct stranding with slight nodularity along the left upper quadrant omentum. 2. Stable appearance of right ovarian mass with fatty and calcific elements classic for a dermoid. 3. There is accentuated enhancement in the walls of the common hepatic duct and common bile duct. A low-grade cholangitis is not readily excluded. 4. Other imaging findings of potential clinical significance: Mild cardiomegaly. Aortic Atherosclerosis (ICD10-I70.0). Lumbar scoliosis, spondylosis, degenerative disc disease, and pars defects at L5. These cause impingement at L5-S1 and lesser impingement at L3-4 and L4-5. 5. 65% compression fracture at L1 slightly worsened than on the prior exam, with associated vertebral sclerosis and posterior retropulsion.   07/06/2017 Surgery   Bilateral Salpingo-Oophorectomy W/Omentectomy, Total Abd Hysterectomy & Radical Dissection For Debulking Right - Ureterolysis, With Or Without Repositioning Of Ureter For Retroperitoneal Fibrosis   07/06/2017 Pathology  Results   A: Omentum, omentectomy - Omentum with minimal residual carcinoma, consistent with high grade serous carcinoma (microscopic; stage ypT3a) - Extensive treatment effect with calcifications, fibrosis, histiocytes, and giant cell response - CRS score 3  B: Uterus with cervix and left ovary and fallopian tube, hysterectomy and left salpingo-oophorectomy - Cervix: Ectocervix and endocervix with no dysplasia or malignancy identified - Endometrium: Inactive with no hyperplasia, atypia, or malignancy identified - Myometrium: Adenomyosis and benign leiomyoma, size 1.2 cm - Serosa: Focal calcification and histiocytes suggestive of treatment effect, with no viable carcinoma identified - Ovary, left: Physiologic changes and no carcinoma identified - Fallopian tube, left: 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 and no malignancy identified  C: Ovary and fallopian tube, right, salpingo-oophorectomy - Right fallopian tube with focal residual carcinoma, consistent with high grade serous carcinoma, and serous tubal intraepithelial carcinoma (STIC) - Associated calcifications and histiocytes suggestive of treatment response - Right ovary with calcifications and histiocytes suggestive of treatment effect, with no definite viable carcinoma identified - Mature cystic teratoma, 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 - See synoptic report and comment   08/14/2017 Tumor Marker   Patient's tumor was tested for the following markers: CA-125 Results of the tumor marker test revealed 14.6   09/04/2017 Tumor Marker   Patient's tumor was tested for the following markers: CA-125 Results of the tumor marker test revealed 12.5   10/03/2017 Genetic Testing   The  Common Hereditary Cancer Panel offered by [facility] includes sequencing and/or deletion duplication testing of the following 47 genes: APC, ATM, AXIN2, BARD1, BMPR1A, BRCA1, BRCA2, BRIP1, CDH1, CDKN2A (p14ARF), CDKN2A (p16INK4a), CKD4, CHEK2, CTNNA1, DICER1, EPCAM  (Deletion/duplication testing only), GREM1 (promoter region deletion/duplication testing only), KIT, MEN1, MLH1, MSH2, MSH3, MSH6, MUTYH, NBN, NF1, NHTL1, PALB2, PDGFRA, PMS2, POLD1, POLE, PTEN, RAD50, RAD51C, RAD51D, SDHB, SDHC, SDHD, SMAD4, SMARCA4. STK11, TP53, TSC1, TSC2, and VHL.  The following genes were evaluated for sequence changes only: SDHA and HOXB13 c.251G>A variant only.  Results: Negative, no pathogenic variants identified.  The date of this test report is 10/03/2017.    10/28/2017 Imaging   1. Stable pulmonary nodules. No new or progressive findings. 2. No residual or recurrent omental disease is identified. No obvious peritoneal surface disease. 3. Increasing soft tissue density and ill-defined interstitial changes around the abdominal wall surgical incision. This may be progressive postoperative changes or granulation tissue but attention on future scans to exclude the possibility of tumor. 4. Status post resection of right adnexal mass. No pelvic mass or pelvic adenopathy. 5. Stable T12 compression fracture.   01/25/2018 Tumor Marker   Patient's tumor was tested for the following markers: CA-125 Results of the tumor marker test revealed 9.2   12/10/2018 Tumor Marker   Patient's tumor was tested for the following markers: CA-125 Results of the tumor marker test revealed 212   12/22/2018 Imaging   1. Interval development of multiple soft tissue lesions in the gastrocolic ligament, mesentery, and along the peritoneal surface. These soft tissue lesions measure up to 6.5 cm in size. Associated interval development of moderate volume ascites in the abdomen and pelvis. Imaging features are consistent with metastatic disease. 2. Soft tissue projects through the rectus sheath in the right paraumbilical region, likely herniated small bowel as there is a large amount of decompressed small bowel does deep to this finding. However, metastatic deposit at this location is also possibility.  3.  Trace pneumobilia in the left liver suggests prior sphincterotomy. 4.  Aortic Atherosclerois (ICD10-170.0)   12/30/2018 Procedure   Successful ultrasound-guided therapeutic paracentesis yielding 4.5 liters of peritoneal fluid.   12/30/2018 Procedure   Technically successful right IJ power-injectable port catheter placement. Ready for routine use.   12/31/2018 -  Chemotherapy   The patient had carboplatin and taxol for chemotherapy treatment.     01/21/2019 Tumor Marker   Patient's tumor was tested for the following markers: CA-125 Results of the tumor marker test revealed 408   02/11/2019 Tumor Marker   Patient's tumor was tested for the following markers: CA-125 Results of the tumor marker test revealed 646.   03/02/2019 Imaging   1. Marked improvement in peritoneal carcinomatosis/ascites, without complete resolution. 2.  Aortic atherosclerosis (ICD10-170.0).     03/25/2019 Tumor Marker   Patient's tumor was tested for the following markers: CA-125 Results of the tumor marker test revealed 24.1.   04/15/2019 Tumor Marker   Patient's tumor was tested for the following markers: CA-125 Results of the tumor marker test revealed 15.3   05/18/2019 Imaging   1. There is progressive, nearly complete resolution of peritoneal and omental nodularity seen on prior examination with very faint residual peritoneal and omental haziness (series 2, image 50, 39).   2. Interval resolution of ascites.   3.  Status post hysterectomy and oophorectomy.   4.  Coronary artery disease.  Aortic Atherosclerosis (ICD10-I70.0).      Tumor Marker   Patient's tumor was tested for the  following markers: CA-125 Results of the tumor marker test revealed 11.2.   06/10/2019 Tumor Marker   Patient's tumor was tested for the following markers: CA-125 Results of the tumor marker test revealed 9.1   07/29/2019 Tumor Marker   Patient's tumor was tested for the following markers: CA-125 Results of the tumor marker  test revealed 8.8   08/18/2019 Imaging   1. No evidence of new or progressive metastatic disease in the abdomen or pelvis. Stable small nodular soft tissue focus at the right umbilicus. No new peritoneal implants. No ascites. 2. Chronic findings include: Aortic Atherosclerosis (ICD10-I70.0). Coronary atherosclerosis. Chronic moderate T12 vertebral compression fractures.   09/09/2019 Tumor Marker   Patient's tumor was tested for the following markers: CA-125 Results of the tumor marker test revealed 11.4.     REVIEW OF SYSTEMS:   Constitutional: Denies fevers, chills or abnormal weight loss Eyes: Denies blurriness of vision Ears, nose, mouth, throat, and face: Denies mucositis or sore throat Respiratory: Denies cough, dyspnea or wheezes Cardiovascular: Denies palpitation, chest discomfort or lower extremity swelling Gastrointestinal:  Denies nausea, heartburn or change in bowel habits Skin: Denies abnormal skin rashes Lymphatics: Denies new lymphadenopathy or easy bruising Neurological:Denies numbness, tingling or new weaknesses Behavioral/Psych: Mood is stable, no new changes  All other systems were reviewed with the patient and are negative.  I have reviewed the past medical history, past surgical history, social history and family history with the patient and they are unchanged from previous note.  ALLERGIES:  is allergic to aspirin; nsaids; and tramadol.  MEDICATIONS:  Current Outpatient Medications  Medication Sig Dispense Refill  . acetaminophen (TYLENOL) 650 MG CR tablet Take 1,300 mg by mouth every 8 (eight) hours.     Marland Kitchen alendronate (FOSAMAX) 70 MG tablet Take 70 mg by mouth every Sunday.     Marland Kitchen atorvastatin (LIPITOR) 80 MG tablet Take 40 mg by mouth at bedtime.     . Cholecalciferol (VITAMIN D3) 2000 units TABS Take 2,000 Units by mouth daily.    . citalopram (CELEXA) 10 MG tablet Take 10 mg by mouth at bedtime    . ferrous sulfate 325 (65 FE) MG tablet Take 325 mg by mouth  daily. 3 hours after meal and 30 min before a meal    . gabapentin (NEURONTIN) 300 MG capsule Take by mouth.    . Glucosamine Sulfate 500 MG TABS Take 500 mg by mouth 2 (two) times daily.     . hydrochlorothiazide (HYDRODIURIL) 25 MG tablet Take 1 tablet (25 mg total) by mouth daily. 90 tablet 3  . lidocaine-prilocaine (EMLA) cream Apply to affected area once 30 g 3  . loratadine (CLARITIN) 10 MG tablet Take 10 mg by mouth at bedtime.     Marland Kitchen losartan (COZAAR) 25 MG tablet Take 25 mg by mouth daily.     . meclizine (ANTIVERT) 25 MG tablet Take 25 mg by mouth 2 (two) times daily as needed for dizziness.     . metFORMIN (GLUCOPHAGE) 1000 MG tablet Take 1,000 mg by mouth 2 (two) times daily with a meal.     . omeprazole (PRILOSEC) 40 MG capsule Take 40 mg by mouth daily.     . ondansetron (ZOFRAN) 8 MG tablet Take 1 tablet (8 mg total) by mouth every 8 (eight) hours as needed for refractory nausea / vomiting. 30 tablet 1  . pioglitazone (ACTOS) 15 MG tablet Take 15 mg by mouth at bedtime.     . prochlorperazine (COMPAZINE) 10 MG  tablet Take 1 tablet (10 mg total) by mouth every 6 (six) hours as needed (Nausea or vomiting). 30 tablet 1  . Pyridoxine HCl (VITAMIN B-6 PO) Take 50 mg by mouth 1 day or 1 dose.    . vitamin B-12 (CYANOCOBALAMIN) 1000 MCG tablet Take 1,000 mcg by mouth daily.      No current facility-administered medications for this visit.   Facility-Administered Medications Ordered in Other Visits  Medication Dose Route Frequency Provider Last Rate Last Admin  . bevacizumab-bvzr (ZIRABEV) 1,500 mg in sodium chloride 0.9 % 100 mL chemo infusion  15 mg/kg (Treatment Plan Recorded) Intravenous Once [name], Marcianna Daily, MD      . heparin lock flush 100 unit/mL  500 Units Intracatheter Once PRN [name], Vondra Aldredge, MD      . sodium chloride flush (NS) 0.9 % injection 10 mL  10 mL Intracatheter Once [name], Makylie Rivere, MD      . sodium chloride flush (NS) 0.9 % injection 10 mL  10 mL Intracatheter PRN [name], Nayef College,  MD        PHYSICAL EXAMINATION: ECOG PERFORMANCE STATUS: 2 - Symptomatic, <50% confined to bed  Vitals:   10/21/19 0826  BP: 138/67  Pulse: 81  Resp: 18  Temp: 99.1 F (37.3 C)  SpO2: 98%   Filed Weights   10/21/19 0826  Weight: 222 lb 4.8 oz (100.8 kg)    GENERAL:alert, no distress and comfortable.  Limited examination as she is sitting on the wheelchair and with class I obesity SKIN: skin color, texture, turgor are normal, no rashes or significant lesions EYES: normal, Conjunctiva are pink and non-injected, sclera clear OROPHARYNX:no exudate, no erythema and lips, buccal mucosa, and tongue normal  NECK: supple, thyroid normal size, non-tender, without nodularity LYMPH:  no palpable lymphadenopathy in the cervical, axillary or inguinal LUNGS: clear to auscultation and percussion with normal breathing effort HEART: regular rate & rhythm and no murmurs and no lower extremity edema ABDOMEN:abdomen soft, non-tender and normal bowel sounds Musculoskeletal:no cyanosis of digits and no clubbing  NEURO: alert & oriented x 3 with fluent speech, no focal motor/sensory deficits  LABORATORY DATA:  I have reviewed the data as listed    Component Value Date/Time   NA 142 10/21/2019 0805   NA 139 06/05/2017 0746   K 4.0 10/21/2019 0805   K 4.1 06/05/2017 0746   CL 105 10/21/2019 0805   CO2 25 10/21/2019 0805   CO2 21 (L) 06/05/2017 0746   GLUCOSE 107 (H) 10/21/2019 0805   GLUCOSE 279 (H) 06/05/2017 0746   BUN 19 10/21/2019 0805   BUN 15.0 06/05/2017 0746   CREATININE 0.91 10/21/2019 0805   CREATININE 0.9 06/05/2017 0746   CALCIUM 8.2 (L) 10/21/2019 0805   CALCIUM 10.1 06/05/2017 0746   PROT 7.0 10/21/2019 0805   PROT 8.1 06/05/2017 0746   ALBUMIN 3.5 10/21/2019 0805   ALBUMIN 4.0 06/05/2017 0746   AST 23 10/21/2019 0805   AST 22 06/05/2017 0746   ALT 20 10/21/2019 0805   ALT 36 06/05/2017 0746   ALKPHOS 62 10/21/2019 0805   ALKPHOS 98 06/05/2017 0746   BILITOT 0.3  10/21/2019 0805   BILITOT 0.44 06/05/2017 0746   GFRNONAA >60 10/21/2019 0805   GFRAA >60 10/21/2019 0805    No results found for: SPEP, UPEP  Lab Results  Component Value Date   WBC 4.0 10/21/2019   NEUTROABS 2.4 10/21/2019   HGB 9.6 (L) 10/21/2019   HCT 31.6 (L) 10/21/2019   MCV 101.0 (  H) 10/21/2019   PLT 215 10/21/2019      Chemistry      Component Value Date/Time   NA 142 10/21/2019 0805   NA 139 06/05/2017 0746   K 4.0 10/21/2019 0805   K 4.1 06/05/2017 0746   CL 105 10/21/2019 0805   CO2 25 10/21/2019 0805   CO2 21 (L) 06/05/2017 0746   BUN 19 10/21/2019 0805   BUN 15.0 06/05/2017 0746   CREATININE 0.91 10/21/2019 0805   CREATININE 0.9 06/05/2017 0746      Component Value Date/Time   CALCIUM 8.2 (L) 10/21/2019 0805   CALCIUM 10.1 06/05/2017 0746   ALKPHOS 62 10/21/2019 0805   ALKPHOS 98 06/05/2017 0746   AST 23 10/21/2019 0805   AST 22 06/05/2017 0746   ALT 20 10/21/2019 0805   ALT 36 06/05/2017 0746   BILITOT 0.3 10/21/2019 0805   BILITOT 0.44 06/05/2017 0746

## 2019-10-22 LAB — CA 125: Cancer Antigen (CA) 125: 21.5 U/mL (ref 0.0–38.1)

## 2019-10-24 ENCOUNTER — Telehealth: Payer: Self-pay | Admitting: Hematology and Oncology

## 2019-10-24 ENCOUNTER — Other Ambulatory Visit: Payer: Self-pay | Admitting: Hematology and Oncology

## 2019-10-24 DIAGNOSIS — C561 Malignant neoplasm of right ovary: Secondary | ICD-10-CM

## 2019-10-24 NOTE — Telephone Encounter (Signed)
Scheduled appts per 4/16 sch msg. Pt confirmed appt date and time.

## 2019-10-31 ENCOUNTER — Encounter (HOSPITAL_COMMUNITY): Payer: Self-pay

## 2019-10-31 ENCOUNTER — Other Ambulatory Visit: Payer: Self-pay

## 2019-10-31 ENCOUNTER — Ambulatory Visit (HOSPITAL_COMMUNITY)
Admission: RE | Admit: 2019-10-31 | Discharge: 2019-10-31 | Disposition: A | Discharge status: Home / Self Care | Payer: Medicare Other | Source: Ambulatory Visit | Attending: Hematology and Oncology | Admitting: Hematology and Oncology

## 2019-10-31 DIAGNOSIS — C561 Malignant neoplasm of right ovary: Secondary | ICD-10-CM | POA: Diagnosis not present

## 2019-10-31 MED ORDER — HEPARIN SOD (PORK) LOCK FLUSH 100 UNIT/ML IV SOLN
INTRAVENOUS | Status: AC
  Filled 2019-10-31: qty 5

## 2019-10-31 MED ORDER — IOHEXOL 300 MG/ML  SOLN
100.0000 mL | Freq: Once | INTRAMUSCULAR | Status: AC | PRN
  Administered 2019-10-31: 100 mL via INTRAVENOUS

## 2019-10-31 MED ORDER — SODIUM CHLORIDE (PF) 0.9 % IJ SOLN
INTRAMUSCULAR | Status: AC
  Filled 2019-10-31: qty 50

## 2019-10-31 MED ORDER — IOHEXOL 9 MG/ML PO SOLN
500.0000 mL | ORAL | Status: AC
  Administered 2019-10-31 (×2): 500 mL via ORAL

## 2019-10-31 MED ORDER — HEPARIN SOD (PORK) LOCK FLUSH 100 UNIT/ML IV SOLN
500.0000 [IU] | Freq: Once | INTRAVENOUS | Status: AC
  Administered 2019-10-31: 11:00:00 500 [IU] via INTRAVENOUS

## 2019-11-01 ENCOUNTER — Encounter: Payer: Self-pay | Admitting: Oncology

## 2019-11-01 ENCOUNTER — Encounter: Payer: Self-pay | Admitting: Hematology and Oncology

## 2019-11-01 ENCOUNTER — Other Ambulatory Visit: Payer: Self-pay

## 2019-11-01 ENCOUNTER — Other Ambulatory Visit: Payer: Self-pay | Admitting: Hematology and Oncology

## 2019-11-01 ENCOUNTER — Inpatient Hospital Stay (HOSPITAL_BASED_OUTPATIENT_CLINIC_OR_DEPARTMENT_OTHER): Payer: Medicare Other | Admitting: Hematology and Oncology

## 2019-11-01 DIAGNOSIS — C561 Malignant neoplasm of right ovary: Secondary | ICD-10-CM

## 2019-11-01 DIAGNOSIS — Z7189 Other specified counseling: Secondary | ICD-10-CM

## 2019-11-01 NOTE — Progress Notes (Signed)
[place] OFFICE PROGRESS NOTE  Patient Care Team: Lilian Coma., MD as PCP - General (Internal Medicine)  ASSESSMENT & PLAN:  Ovarian cancer on right [facility]) I have reviewed her blood work and imaging studies with the patient, her husband and also with her sisters over the phone Even though her tumor marker is within normal limits, when it doubled, that prompted CT imaging even though she is completely asymptomatic Unfortunately, CT imaging showed evidence of peritoneal disease along with 3 liver lesions We will discontinue bevacizumab as she clearly has progressed through this treatment We discussed chemotherapy options With multiple relapses, she is aware that treatment goal is palliative in nature Due to the short interval of disease relapse after recent treatment with carboplatin and paclitaxel, I recommend a different combination of chemotherapy instead of using paclitaxel  She is still consider platinum sensitive We discussed options of treatment The first option would be combination treatment of carboplatin with gemcitabine, given some days 1, 8 and rest day 15, for cycle of every 21 days Some of the common risks, benefits, side effects of treatment including risk of nausea, pancytopenia, allergic reaction, risk of transfusion support, infection and hospitalization were discussed with the patient Given her history of chronic anemia, I might have to reduce the dose of gemcitabine upfront  The second option discussed would be combination treatment of carboplatin with liposomal doxorubicin This is given once every 4 weeks Some of the common risks, benefits, side effects of treatment including risk of nausea, congestive heart failure, pancytopenia, allergic reaction, infection and hospitalization were discussed  After appropriate very prolonged discussion, she is undecided She will call me the next few days for final treatment decision Once she made her final decision,  I will adjust her chemotherapy and appointment accordingly  I will also ask pathologist to add additional testing on her prior tissue for quantification of estrogen receptor positivity and to see if there is sufficient tissue to add PD-L1 We also discussed the role of clinical trial and second opinion  Goals of care, counseling/discussion We have extensive discussions about goals of care We discussed prognosis with or without treatment I estimated with third line of chemotherapy, best response rates are estimated in the region of 30 to 40% with very rare possibility that she will achieve complete response I recommend minimum 3 cycles of treatment before repeat CT imaging   No orders of the defined types were placed in this encounter.   All questions were answered. The patient knows to call the clinic with any problems, questions or concerns. The total time spent in the appointment was 80 minutes encounter with patients including review of chart and various tests results, discussions about plan of care and coordination of care plan   Heath Lark, MD 11/01/2019 2:37 PM  INTERVAL HISTORY: Please see below for problem oriented charting. 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    03/20/2017 Imaging   GYN ultrasound was performed this revealed a small fundus  measuring 5 cm there are 2 small fibroids within the fundus with the largest measuring 1.3 cm Within the endometrium there is a small amount a fluid the endometrial wall was slightly thickened at 4.8 mm the ultrasound was consistent with a small endometrial polyp  The right adnexa measured 7.4 x 6.7 cm contained a both a cystic and solid mass with a small amount of shadowing This is consistent with a dermoid tumor the left ovary was 2.6 x 1.5 x 1.0 cm There is no other adnexal masses no free fluid within the abdomen no ascites     04/10/2017 Surgery   Operation: Operative Laparscopy with  Peritoneal biopsy 2 hysteroscopy D&C Surgeon: Lynder Parents MD  Specimens: Peritoneal biopsy and cellular washings  Complications: None FIndings Small amount of ascites was found cellular washings were sent There was peritoneal studding And omental caking Even in steep Trendelenburg the ovaries and uterus were unable to be visualized due to the small intestines in the lower pelvis     04/10/2017 Pathology Results   Biopsy from [facility] was nondiagnostic.  Peritoneum biopsy did not reveal malignancy.  Endometrial biopsy also did not reveal malignancy.   04/13/2017 Tumor Marker   Patient's tumor was tested for the following markers: CA-125 Results of the tumor marker test revealed 324.1   04/20/2017 Tumor Marker   Patient's tumor was tested for the following markers: CA-125 Results of the tumor marker test revealed 345.6   04/22/2017 Pathology Results   Peritoneum, biopsy - CARCINOMA. - SEE COMMENT.   04/22/2017 Procedure   CT-guided core biopsy performed of peritoneal tumor in the left lower quadrant.   04/24/2017 - 09/25/2017 Chemotherapy   She received carboplatin and Taxol x 3 cycles followed by interval surgical debulking on 12/31. Chemotherapy was subsequently resumed for 3 more cycles   06/05/2017 Tumor Marker   Patient's tumor was tested for the following  markers: CA-125 Results of the tumor marker test revealed 27.5   06/17/2017 Imaging   1. Considerable improvement in the prior omental caking, which now presents mainly as an indistinct stranding with slight nodularity along the left upper quadrant omentum. 2. Stable appearance of right ovarian mass with fatty and calcific elements classic for a dermoid. 3. There is accentuated enhancement in the walls of the common hepatic duct and common bile duct. A low-grade cholangitis is not readily excluded. 4. Other imaging findings of potential clinical significance: Mild cardiomegaly. Aortic Atherosclerosis (ICD10-I70.0). Lumbar scoliosis, spondylosis, degenerative disc disease, and pars defects at L5. These cause impingement at L5-S1 and lesser impingement at L3-4 and L4-5. 5. 65% compression fracture at L1 slightly worsened than on the prior exam, with associated vertebral sclerosis and posterior retropulsion.   07/06/2017 Surgery   Bilateral Salpingo-Oophorectomy W/Omentectomy, Total Abd Hysterectomy & Radical Dissection For Debulking Right - Ureterolysis, With Or Without Repositioning Of Ureter For Retroperitoneal Fibrosis   07/06/2017 Pathology Results   A: Omentum, omentectomy - Omentum with minimal residual carcinoma, consistent with high grade serous carcinoma (microscopic; stage ypT3a) - Extensive treatment effect with calcifications, fibrosis, histiocytes, and giant cell response - CRS score 3  B: Uterus with cervix and left ovary and fallopian tube, hysterectomy and left salpingo-oophorectomy - Cervix: Ectocervix and endocervix with no dysplasia or malignancy identified - Endometrium: Inactive with no hyperplasia, atypia, or malignancy identified -  Myometrium: Adenomyosis and benign leiomyoma, size 1.2 cm - Serosa: Focal calcification and histiocytes suggestive of treatment effect, with no viable carcinoma identified - Ovary, left: Physiologic changes and no carcinoma identified -  Fallopian tube, left: 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 and no malignancy identified  C: Ovary and fallopian tube, right, salpingo-oophorectomy - Right fallopian tube with focal residual carcinoma, consistent with high grade serous carcinoma, and serous tubal intraepithelial carcinoma (STIC) - Associated calcifications and histiocytes suggestive of treatment response - Right ovary with calcifications and histiocytes suggestive of treatment effect, with no definite viable carcinoma identified - Mature cystic teratoma, 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 - See synoptic report and comment   08/14/2017 Tumor Marker   Patient's tumor was tested for the following markers: CA-125 Results of the tumor marker test revealed 14.6   09/04/2017 Tumor Marker   Patient's tumor was tested for the following markers: CA-125 Results of the tumor marker test revealed 12.5   10/03/2017 Genetic Testing   The Common Hereditary Cancer Panel offered by [facility] includes sequencing and/or deletion duplication testing of the following 47 genes: APC, ATM, AXIN2, BARD1, BMPR1A, BRCA1, BRCA2, BRIP1, CDH1, CDKN2A (p14ARF), CDKN2A (p16INK4a), CKD4, CHEK2, CTNNA1, DICER1, EPCAM (Deletion/duplication testing only), GREM1 (promoter region deletion/duplication testing only), KIT, MEN1, MLH1, MSH2, MSH3, MSH6, MUTYH, NBN, NF1, NHTL1, PALB2, PDGFRA, PMS2, POLD1, POLE, PTEN, RAD50, RAD51C, RAD51D, SDHB, SDHC, SDHD, SMAD4, SMARCA4. STK11, TP53, TSC1, TSC2, and VHL.  The following genes were evaluated for sequence changes only: SDHA and HOXB13 c.251G>A variant only.  Results: Negative, no pathogenic variants identified.  The date of this test report is 10/03/2017.    10/28/2017 Imaging   1. Stable pulmonary nodules. No new or progressive findings. 2. No residual or recurrent omental disease is identified. No obvious peritoneal surface disease. 3. Increasing soft tissue density and ill-defined interstitial changes around the abdominal wall surgical  incision. This may be progressive postoperative changes or granulation tissue but attention on future scans to exclude the possibility of tumor. 4. Status post resection of right adnexal mass. No pelvic mass or pelvic adenopathy. 5. Stable T12 compression fracture.   01/25/2018 Tumor Marker   Patient's tumor was tested for the following markers: CA-125 Results of the tumor marker test revealed 9.2   12/10/2018 Tumor Marker   Patient's tumor was tested for the following markers: CA-125 Results of the tumor marker test revealed 212   12/22/2018 Imaging   1. Interval development of multiple soft tissue lesions in the gastrocolic ligament, mesentery, and along the peritoneal surface. These soft tissue lesions measure up to 6.5 cm in size. Associated interval development of moderate volume ascites in the abdomen and pelvis. Imaging features are consistent with metastatic disease. 2. Soft tissue projects through the rectus sheath in the right paraumbilical region, likely herniated small bowel as there is a large amount of decompressed small bowel does deep to this finding. However, metastatic deposit at this location is also possibility.  3. Trace pneumobilia in the left liver suggests prior sphincterotomy. 4.  Aortic Atherosclerois (ICD10-170.0)   12/30/2018 Procedure   Successful ultrasound-guided therapeutic paracentesis yielding 4.5 liters of peritoneal fluid.   12/30/2018 Procedure   Technically successful right IJ power-injectable port catheter placement. Ready for routine use.   12/31/2018 -  Chemotherapy   The patient had carboplatin and taxol for chemotherapy treatment.     01/21/2019 Tumor Marker   Patient's tumor was tested for the following markers: CA-125 Results of the tumor marker test revealed 408  02/11/2019 Tumor Marker   Patient's tumor was tested for the following markers: CA-125 Results of the tumor marker test revealed 646.   03/02/2019 Imaging   1. Marked improvement in  peritoneal carcinomatosis/ascites, without complete resolution. 2.  Aortic atherosclerosis (ICD10-170.0).     03/25/2019 Tumor Marker   Patient's tumor was tested for the following markers: CA-125 Results of the tumor marker test revealed 24.1.   04/15/2019 Tumor Marker   Patient's tumor was tested for the following markers: CA-125 Results of the tumor marker test revealed 15.3   05/18/2019 Imaging   1. There is progressive, nearly complete resolution of peritoneal and omental nodularity seen on prior examination with very faint residual peritoneal and omental haziness (series 2, image 50, 39).   2. Interval resolution of ascites.   3.  Status post hysterectomy and oophorectomy.   4.  Coronary artery disease.  Aortic Atherosclerosis (ICD10-I70.0).      Tumor Marker   Patient's tumor was tested for the following markers: CA-125 Results of the tumor marker test revealed 11.2.   06/10/2019 Tumor Marker   Patient's tumor was tested for the following markers: CA-125 Results of the tumor marker test revealed 9.1   07/29/2019 Tumor Marker   Patient's tumor was tested for the following markers: CA-125 Results of the tumor marker test revealed 8.8   08/18/2019 Imaging   1. No evidence of new or progressive metastatic disease in the abdomen or pelvis. Stable small nodular soft tissue focus at the right umbilicus. No new peritoneal implants. No ascites. 2. Chronic findings include: Aortic Atherosclerosis (ICD10-I70.0). Coronary atherosclerosis. Chronic moderate T12 vertebral compression fractures.   09/09/2019 Tumor Marker   Patient's tumor was tested for the following markers: CA-125 Results of the tumor marker test revealed 11.4.   10/21/2019 Tumor Marker   Patient's tumor was tested for the following markers: CA-125 Results of the tumor marker test revealed 21.5   10/31/2019 Imaging   1. New hepatic and peritoneal metastatic disease. 2.  Aortic atherosclerosis (ICD10-I70.0).        REVIEW OF SYSTEMS:   Constitutional: Denies fevers, chills or abnormal weight loss Eyes: Denies blurriness of vision Ears, nose, mouth, throat, and face: Denies mucositis or sore throat Respiratory: Denies cough, dyspnea or wheezes Cardiovascular: Denies palpitation, chest discomfort or lower extremity swelling Gastrointestinal:  Denies nausea, heartburn or change in bowel habits Skin: Denies abnormal skin rashes Lymphatics: Denies new lymphadenopathy or easy bruising Neurological:Denies numbness, tingling or new weaknesses Behavioral/Psych: Mood is stable, no new changes  All other systems were reviewed with the patient and are negative.  I have reviewed the past medical history, past surgical history, social history and family history with the patient and they are unchanged from previous note.  ALLERGIES:  is allergic to aspirin; nsaids; and tramadol.  MEDICATIONS:  Current Outpatient Medications  Medication Sig Dispense Refill  . acetaminophen (TYLENOL) 650 MG CR tablet Take 1,300 mg by mouth every 8 (eight) hours.     Marland Kitchen alendronate (FOSAMAX) 70 MG tablet Take 70 mg by mouth every Sunday.     Marland Kitchen atorvastatin (LIPITOR) 80 MG tablet Take 40 mg by mouth at bedtime.     . Cholecalciferol (VITAMIN D3) 2000 units TABS Take 2,000 Units by mouth daily.    . citalopram (CELEXA) 10 MG tablet Take 10 mg by mouth at bedtime    . ferrous sulfate 325 (65 FE) MG tablet Take 325 mg by mouth daily. 3 hours after meal and 30 min before  a meal    . gabapentin (NEURONTIN) 300 MG capsule Take by mouth.    . Glucosamine Sulfate 500 MG TABS Take 500 mg by mouth 2 (two) times daily.     . hydrochlorothiazide (HYDRODIURIL) 25 MG tablet Take 1 tablet (25 mg total) by mouth daily. 90 tablet 3  . lidocaine-prilocaine (EMLA) cream Apply to affected area once 30 g 3  . loratadine (CLARITIN) 10 MG tablet Take 10 mg by mouth at bedtime.     Marland Kitchen losartan (COZAAR) 25 MG tablet Take 25 mg by mouth daily.     .  meclizine (ANTIVERT) 25 MG tablet Take 25 mg by mouth 2 (two) times daily as needed for dizziness.     . metFORMIN (GLUCOPHAGE) 1000 MG tablet Take 1,000 mg by mouth 2 (two) times daily with a meal.     . omeprazole (PRILOSEC) 40 MG capsule Take 40 mg by mouth daily.     . ondansetron (ZOFRAN) 8 MG tablet Take 1 tablet (8 mg total) by mouth every 8 (eight) hours as needed for refractory nausea / vomiting. 30 tablet 1  . pioglitazone (ACTOS) 15 MG tablet Take 15 mg by mouth at bedtime.     . prochlorperazine (COMPAZINE) 10 MG tablet Take 1 tablet (10 mg total) by mouth every 6 (six) hours as needed (Nausea or vomiting). 30 tablet 1  . Pyridoxine HCl (VITAMIN B-6 PO) Take 50 mg by mouth 1 day or 1 dose.    . vitamin B-12 (CYANOCOBALAMIN) 1000 MCG tablet Take 1,000 mcg by mouth daily.      No current facility-administered medications for this visit.   Facility-Administered Medications Ordered in Other Visits  Medication Dose Route Frequency Provider Last Rate Last Admin  . sodium chloride flush (NS) 0.9 % injection 10 mL  10 mL Intracatheter Once [name], Demico Ploch, MD        PHYSICAL EXAMINATION: ECOG PERFORMANCE STATUS: 1 - Symptomatic but completely ambulatory  Vitals:   11/01/19 1309  BP: 136/70  Pulse: 76  Resp: 18  Temp: 98.5 F (36.9 C)  SpO2: 98%   Filed Weights   11/01/19 1309  Weight: 221 lb 12.8 oz (100.6 kg)    GENERAL:alert, no distress and comfortable NEURO: alert & oriented x 3 with fluent speech, no focal motor/sensory deficits  LABORATORY DATA:  I have reviewed the data as listed    Component Value Date/Time   NA 142 10/21/2019 0805   NA 139 06/05/2017 0746   K 4.0 10/21/2019 0805   K 4.1 06/05/2017 0746   CL 105 10/21/2019 0805   CO2 25 10/21/2019 0805   CO2 21 (L) 06/05/2017 0746   GLUCOSE 107 (H) 10/21/2019 0805   GLUCOSE 279 (H) 06/05/2017 0746   BUN 19 10/21/2019 0805   BUN 15.0 06/05/2017 0746   CREATININE 0.91 10/21/2019 0805   CREATININE 0.9  06/05/2017 0746   CALCIUM 8.2 (L) 10/21/2019 0805   CALCIUM 10.1 06/05/2017 0746   PROT 7.0 10/21/2019 0805   PROT 8.1 06/05/2017 0746   ALBUMIN 3.5 10/21/2019 0805   ALBUMIN 4.0 06/05/2017 0746   AST 23 10/21/2019 0805   AST 22 06/05/2017 0746   ALT 20 10/21/2019 0805   ALT 36 06/05/2017 0746   ALKPHOS 62 10/21/2019 0805   ALKPHOS 98 06/05/2017 0746   BILITOT 0.3 10/21/2019 0805   BILITOT 0.44 06/05/2017 0746   GFRNONAA >60 10/21/2019 0805   GFRAA >60 10/21/2019 0805    No results found for: SPEP,  UPEP  Lab Results  Component Value Date   WBC 4.0 10/21/2019   NEUTROABS 2.4 10/21/2019   HGB 9.6 (L) 10/21/2019   HCT 31.6 (L) 10/21/2019   MCV 101.0 (H) 10/21/2019   PLT 215 10/21/2019      Chemistry      Component Value Date/Time   NA 142 10/21/2019 0805   NA 139 06/05/2017 0746   K 4.0 10/21/2019 0805   K 4.1 06/05/2017 0746   CL 105 10/21/2019 0805   CO2 25 10/21/2019 0805   CO2 21 (L) 06/05/2017 0746   BUN 19 10/21/2019 0805   BUN 15.0 06/05/2017 0746   CREATININE 0.91 10/21/2019 0805   CREATININE 0.9 06/05/2017 0746      Component Value Date/Time   CALCIUM 8.2 (L) 10/21/2019 0805   CALCIUM 10.1 06/05/2017 0746   ALKPHOS 62 10/21/2019 0805   ALKPHOS 98 06/05/2017 0746   AST 23 10/21/2019 0805   AST 22 06/05/2017 0746   ALT 20 10/21/2019 0805   ALT 36 06/05/2017 0746   BILITOT 0.3 10/21/2019 0805   BILITOT 0.44 06/05/2017 0746       RADIOGRAPHIC STUDIES: I have reviewed multiple CT imaging with the patient and family I have personally reviewed the radiological images as listed and agreed with the findings in the report. CT ABDOMEN PELVIS W CONTRAST  Result Date: 10/31/2019 CLINICAL DATA:  Ovarian cancer, assess treatment response. Rising CA-125. Chemotherapy complete. Immunotherapy in progress. EXAM: CT ABDOMEN AND PELVIS WITH CONTRAST TECHNIQUE: Multidetector CT imaging of the abdomen and pelvis was performed using the standard protocol following bolus  administration of intravenous contrast. CONTRAST:  151m OMNIPAQUE IOHEXOL 300 MG/ML  SOLN COMPARISON:  08/18/2019. FINDINGS: Lower chest: Image quality in the lung bases is degraded by respiratory motion. 3 mm right lower lobe nodule (series 6, image 3), stable. Heart is enlarged. No pericardial or pleural effusion. Distal esophagus is unremarkable. Hepatobiliary: There are 3 new low-attenuation lesions in the caudate and right hepatic lobe, measuring up to 1.6 cm in the caudate (2/24). Cholecystectomy. Biliary ductal dilatation appears similar. Pancreas: Negative. Spleen: Negative. Adrenals/Urinary Tract: Adrenal glands and kidneys are unremarkable. Ureters are decompressed. Bladder is low in volume. Stomach/Bowel: Stomach is unremarkable. Small duodenal diverticulum. Stomach, small bowel and colon are unremarkable. Appendix not readily visualized. Vascular/Lymphatic: Atherosclerotic calcification of the aorta without aneurysm. No pathologically enlarged lymph nodes. Reproductive: Hysterectomy.  No adnexal mass. Other: Small ascites, new. New omental thickening and nodularity. Index nodule along the dome of the left hepatic lobe measures 10 mm (2/14). Small bowel mesenteric nodule measures 10 mm (2/58), also new. Musculoskeletal: Degenerative changes in the spine. Bilateral L5 pars defects with grade 1 anterolisthesis. T12 compression fracture is unchanged. Levoconvex scoliosis. IMPRESSION: 1. New hepatic and peritoneal metastatic disease. 2.  Aortic atherosclerosis (ICD10-I70.0). Electronically Signed   By: MLorin PicketM.D.   On: 10/31/2019 13:50

## 2019-11-01 NOTE — Progress Notes (Signed)
Requested PD-L1 and ER testing on Accession: [id number] with [facility] Pathology via email.

## 2019-11-01 NOTE — Assessment & Plan Note (Signed)
We have extensive discussions about goals of care We discussed prognosis with or without treatment I estimated with third line of chemotherapy, best response rates are estimated in the region of 30 to 40% with very rare possibility that she will achieve complete response I recommend minimum 3 cycles of treatment before repeat CT imaging

## 2019-11-01 NOTE — Assessment & Plan Note (Addendum)
I have reviewed her blood work and imaging studies with the patient, her husband and also with her sisters over the phone Even though her tumor marker is within normal limits, when it doubled, that prompted CT imaging even though she is completely asymptomatic Unfortunately, CT imaging showed evidence of peritoneal disease along with 3 liver lesions We will discontinue bevacizumab as she clearly has progressed through this treatment We discussed chemotherapy options With multiple relapses, she is aware that treatment goal is palliative in nature Due to the short interval of disease relapse after recent treatment with carboplatin and paclitaxel, I recommend a different combination of chemotherapy instead of using paclitaxel  She is still consider platinum sensitive We discussed options of treatment The first option would be combination treatment of carboplatin with gemcitabine, given some days 1, 8 and rest day 15, for cycle of every 21 days Some of the common risks, benefits, side effects of treatment including risk of nausea, pancytopenia, allergic reaction, risk of transfusion support, infection and hospitalization were discussed with the patient Given her history of chronic anemia, I might have to reduce the dose of gemcitabine upfront  The second option discussed would be combination treatment of carboplatin with liposomal doxorubicin This is given once every 4 weeks Some of the common risks, benefits, side effects of treatment including risk of nausea, congestive heart failure, pancytopenia, allergic reaction, infection and hospitalization were discussed  After appropriate very prolonged discussion, she is undecided She will call me the next few days for final treatment decision Once she made her final decision, I will adjust her chemotherapy and appointment accordingly  I will also ask pathologist to add additional testing on her prior tissue for quantification of estrogen receptor  positivity and to see if there is sufficient tissue to add PD-L1 We also discussed the role of clinical trial and second opinion

## 2019-11-02 ENCOUNTER — Other Ambulatory Visit: Payer: Self-pay | Admitting: Hematology and Oncology

## 2019-11-02 ENCOUNTER — Telehealth: Payer: Self-pay | Admitting: Oncology

## 2019-11-02 DIAGNOSIS — C561 Malignant neoplasm of right ovary: Secondary | ICD-10-CM

## 2019-11-02 DIAGNOSIS — Z7189 Other specified counseling: Secondary | ICD-10-CM

## 2019-11-02 DIAGNOSIS — D539 Nutritional anemia, unspecified: Secondary | ICD-10-CM

## 2019-11-02 NOTE — Telephone Encounter (Signed)
I changed her Rx and sent scheduling msg thanks

## 2019-11-02 NOTE — Progress Notes (Signed)
DISCONTINUE ON PATHWAY REGIMEN - Ovarian     A cycle is every 21 days:     Paclitaxel      Carboplatin   **Always confirm dose/schedule in your pharmacy ordering system**  REASON: Disease Progression PRIOR TREATMENT: [id number]: Carboplatin AUC=6 + Paclitaxel 175 mg/m2 q21 Days x 2-4 Cycles TREATMENT RESPONSE: Progressive Disease (PD)  START ON PATHWAY REGIMEN - Ovarian     A cycle is every 21 days:     Gemcitabine      Carboplatin   **Always confirm dose/schedule in your pharmacy ordering system**  Patient Characteristics: Recurrent or Progressive Disease, Third Line, Platinum Sensitive and ? 6 Months Since Last Platinum Therapy, BRCA Mutation Absent Therapeutic Status: Recurrent or Progressive Disease BRCA Mutation Status: Absent Line of Therapy: Third Line  Intent of Therapy: Non-Curative / Palliative Intent, Discussed with Patient

## 2019-11-02 NOTE — Telephone Encounter (Signed)
Cheryl Melton called and said she has decided on option one for chemotherapy - carboplatin and gemcitabine.

## 2019-11-04 ENCOUNTER — Other Ambulatory Visit: Payer: Self-pay | Admitting: Hematology and Oncology

## 2019-11-04 ENCOUNTER — Other Ambulatory Visit: Payer: Self-pay | Admitting: Oncology

## 2019-11-04 ENCOUNTER — Telehealth: Payer: Self-pay | Admitting: Hematology and Oncology

## 2019-11-04 ENCOUNTER — Encounter: Payer: Self-pay | Admitting: Oncology

## 2019-11-04 ENCOUNTER — Telehealth: Payer: Self-pay | Admitting: Oncology

## 2019-11-04 DIAGNOSIS — C561 Malignant neoplasm of right ovary: Secondary | ICD-10-CM

## 2019-11-04 DIAGNOSIS — Z7189 Other specified counseling: Secondary | ICD-10-CM

## 2019-11-04 MED ORDER — ONDANSETRON HCL 8 MG PO TABS: 8.0000 mg | ORAL_TABLET | Freq: Three times a day (TID) | ORAL | 1 refills | Status: DC | PRN

## 2019-11-04 MED ORDER — PROCHLORPERAZINE MALEATE 10 MG PO TABS: 10.0000 mg | ORAL_TABLET | Freq: Four times a day (QID) | ORAL | 1 refills | Status: DC | PRN

## 2019-11-04 NOTE — Telephone Encounter (Signed)
I sent refill for anti-emetics No need to take steroids Scheduler will work on her schedule

## 2019-11-04 NOTE — Telephone Encounter (Signed)
Shamonda called and said the prescriptions went to [facility] and would like them sent to [facility].  Called [facility] and canceled the prescriptions and resent them to [facility].

## 2019-11-04 NOTE — Telephone Encounter (Signed)
Cheryl Melton called and asked if she needs to take decadron before her infusion and also asked about antiemetics for afterwards.  She does not have any left and uses the [facility] in [place].

## 2019-11-04 NOTE — Telephone Encounter (Signed)
Scheduled appts per 4/28 sch msg. Pt confirmed appt date and time.  

## 2019-11-04 NOTE — Telephone Encounter (Signed)
Called Cary back and informed her of note below from Dr. [name].  She verbalized understanding and agreement.

## 2019-11-07 NOTE — Progress Notes (Signed)
Pharmacist Chemotherapy Monitoring - Initial Assessment    Anticipated start date: 11/11/19   Regimen:  . Are orders appropriate based on the patient's diagnosis, regimen, and cycle? Yes . Does the plan date match the patient's scheduled date? Yes . Is the sequencing of drugs appropriate? Yes . Are the premedications appropriate for the patient's regimen? Yes . Prior Authorization for treatment is: Pending o If applicable, is the correct biosimilar selected based on the patient's insurance? not applicable  Organ Function and Labs: Marland Kitchen Are dose adjustments needed based on the patient's renal function, hepatic function, or hematologic function? No . Are appropriate labs ordered prior to the start of patient's treatment? Yes . Other organ system assessment, if indicated: N/A . The following baseline labs, if indicated, have been ordered: N/A  Dose Assessment: . Are the drug doses appropriate? Yes . Are the following correct: o Drug concentrations Yes o IV fluid compatible with drug Yes o Administration routes Yes o Timing of therapy Yes . If applicable, does the patient have documented access for treatment and/or plans for port-a-cath placement? yes . If applicable, have lifetime cumulative doses been properly documented and assessed? yes Lifetime Dose Tracking  . Carboplatin: 7,920 mg = 0.01 % of the maximum lifetime dose of 999,999,999 mg  o   Toxicity Monitoring/Prevention: . The patient has the following take home antiemetics prescribed: Ondansetron and Prochlorperazine . The patient has the following take home medications prescribed: N/A . Medication allergies and previous infusion related reactions, if applicable, have been reviewed and addressed. Yes . The patient's current medication list has been assessed for drug-drug interactions with their chemotherapy regimen. no significant drug-drug interactions were identified on review.  Order Review: . Are the treatment plan orders  signed? Yes . Is the patient scheduled to see a provider prior to their treatment? No  I verify that I have reviewed each item in the above checklist and answered each question accordingly.  [name] [facility] 11/07/2019 2:02 PM

## 2019-11-11 ENCOUNTER — Other Ambulatory Visit: Payer: Self-pay

## 2019-11-11 ENCOUNTER — Inpatient Hospital Stay: Payer: Medicare Other

## 2019-11-11 ENCOUNTER — Inpatient Hospital Stay: Payer: Medicare Other | Attending: Gynecology

## 2019-11-11 VITALS — BP 135/64 | HR 78 | Temp 98.3°F | Resp 18

## 2019-11-11 DIAGNOSIS — Z452 Encounter for adjustment and management of vascular access device: Secondary | ICD-10-CM | POA: Diagnosis not present

## 2019-11-11 DIAGNOSIS — C561 Malignant neoplasm of right ovary: Secondary | ICD-10-CM | POA: Insufficient documentation

## 2019-11-11 DIAGNOSIS — I10 Essential (primary) hypertension: Secondary | ICD-10-CM | POA: Diagnosis not present

## 2019-11-11 DIAGNOSIS — D61810 Antineoplastic chemotherapy induced pancytopenia: Secondary | ICD-10-CM | POA: Diagnosis not present

## 2019-11-11 DIAGNOSIS — Z5111 Encounter for antineoplastic chemotherapy: Secondary | ICD-10-CM | POA: Insufficient documentation

## 2019-11-11 DIAGNOSIS — E119 Type 2 diabetes mellitus without complications: Secondary | ICD-10-CM | POA: Diagnosis not present

## 2019-11-11 DIAGNOSIS — Z7189 Other specified counseling: Secondary | ICD-10-CM

## 2019-11-11 LAB — CBC WITH DIFFERENTIAL (CANCER CENTER ONLY)
Abs Immature Granulocytes: 0 10*3/uL (ref 0.00–0.07)
Basophils Absolute: 0 10*3/uL (ref 0.0–0.1)
Basophils Relative: 1 %
Eosinophils Absolute: 0.1 10*3/uL (ref 0.0–0.5)
Eosinophils Relative: 3 %
HCT: 32 % — ABNORMAL LOW (ref 36.0–46.0)
Hemoglobin: 9.9 g/dL — ABNORMAL LOW (ref 12.0–15.0)
Immature Granulocytes: 0 %
Lymphocytes Relative: 28 %
Lymphs Abs: 1.2 10*3/uL (ref 0.7–4.0)
MCH: 30.7 pg (ref 26.0–34.0)
MCHC: 30.9 g/dL (ref 30.0–36.0)
MCV: 99.4 fL (ref 80.0–100.0)
Monocytes Absolute: 0.3 10*3/uL (ref 0.1–1.0)
Monocytes Relative: 7 %
Neutro Abs: 2.6 10*3/uL (ref 1.7–7.7)
Neutrophils Relative %: 61 %
Platelet Count: 212 10*3/uL (ref 150–400)
RBC: 3.22 MIL/uL — ABNORMAL LOW (ref 3.87–5.11)
RDW: 15.8 % — ABNORMAL HIGH (ref 11.5–15.5)
WBC Count: 4.2 10*3/uL (ref 4.0–10.5)
nRBC: 0 % (ref 0.0–0.2)

## 2019-11-11 LAB — CMP (CANCER CENTER ONLY)
ALT: 22 U/L (ref 0–44)
AST: 30 U/L (ref 15–41)
Albumin: 3.5 g/dL (ref 3.5–5.0)
Alkaline Phosphatase: 64 U/L (ref 38–126)
Anion gap: 12 (ref 5–15)
BUN: 27 mg/dL — ABNORMAL HIGH (ref 8–23)
CO2: 25 mmol/L (ref 22–32)
Calcium: 8.9 mg/dL (ref 8.9–10.3)
Chloride: 103 mmol/L (ref 98–111)
Creatinine: 0.86 mg/dL (ref 0.44–1.00)
GFR, Est AFR Am: >60 mL/min (ref >60)
GFR, Estimated: >60 mL/min (ref >60)
Glucose, Bld: 157 mg/dL — ABNORMAL HIGH (ref 70–99)
Potassium: 4.4 mmol/L (ref 3.5–5.1)
Sodium: 140 mmol/L (ref 135–145)
Total Bilirubin: 0.3 mg/dL (ref 0.3–1.2)
Total Protein: 7 g/dL (ref 6.5–8.1)

## 2019-11-11 MED ORDER — SODIUM CHLORIDE 0.9% FLUSH
10.0000 mL | INTRAVENOUS | Status: DC | PRN
  Administered 2019-11-11: 12:00:00 10 mL
  Filled 2019-11-11: qty 10

## 2019-11-11 MED ORDER — HEPARIN SOD (PORK) LOCK FLUSH 100 UNIT/ML IV SOLN
500.0000 [IU] | Freq: Once | INTRAVENOUS | Status: AC | PRN
  Administered 2019-11-11: 500 [IU]
  Filled 2019-11-11: qty 5

## 2019-11-11 MED ORDER — SODIUM CHLORIDE 0.9 % IV SOLN
10.0000 mg | Freq: Once | INTRAVENOUS | Status: AC
  Administered 2019-11-11: 09:00:00 10 mg via INTRAVENOUS
  Filled 2019-11-11: qty 10

## 2019-11-11 MED ORDER — FAMOTIDINE IN NACL 20-0.9 MG/50ML-% IV SOLN
INTRAVENOUS | Status: AC
  Filled 2019-11-11: qty 50

## 2019-11-11 MED ORDER — PALONOSETRON HCL INJECTION 0.25 MG/5ML
0.2500 mg | Freq: Once | INTRAVENOUS | Status: AC
  Administered 2019-11-11: 0.25 mg via INTRAVENOUS

## 2019-11-11 MED ORDER — SODIUM CHLORIDE 0.9 % IV SOLN
600.0000 mg/m2 | Freq: Once | INTRAVENOUS | Status: AC
  Administered 2019-11-11: 11:00:00 1292 mg via INTRAVENOUS
  Filled 2019-11-11: qty 33.98

## 2019-11-11 MED ORDER — SODIUM CHLORIDE 0.9 % IV SOLN
Freq: Once | INTRAVENOUS | Status: AC
  Filled 2019-11-11: qty 250

## 2019-11-11 MED ORDER — SODIUM CHLORIDE 0.9 % IV SOLN
150.0000 mg | Freq: Once | INTRAVENOUS | Status: AC
  Administered 2019-11-11: 10:00:00 150 mg via INTRAVENOUS
  Filled 2019-11-11: qty 150

## 2019-11-11 MED ORDER — FAMOTIDINE IN NACL 20-0.9 MG/50ML-% IV SOLN
20.0000 mg | Freq: Once | INTRAVENOUS | Status: AC
  Administered 2019-11-11: 09:00:00 20 mg via INTRAVENOUS

## 2019-11-11 MED ORDER — DIPHENHYDRAMINE HCL 50 MG/ML IJ SOLN
INTRAMUSCULAR | Status: AC
  Filled 2019-11-11: qty 1

## 2019-11-11 MED ORDER — DIPHENHYDRAMINE HCL 50 MG/ML IJ SOLN
50.0000 mg | Freq: Once | INTRAMUSCULAR | Status: AC
  Administered 2019-11-11: 50 mg via INTRAVENOUS

## 2019-11-11 MED ORDER — SODIUM CHLORIDE 0.9 % IV SOLN
446.8000 mg | Freq: Once | INTRAVENOUS | Status: AC
  Administered 2019-11-11: 450 mg via INTRAVENOUS
  Filled 2019-11-11: qty 45

## 2019-11-11 MED ORDER — PALONOSETRON HCL INJECTION 0.25 MG/5ML
INTRAVENOUS | Status: AC
  Filled 2019-11-11: qty 5

## 2019-11-11 NOTE — Patient Instructions (Signed)
[facility] Discharge Instructions for Patients Receiving Chemotherapy  Today you received the following chemotherapy agents Gemzar; Carboplatin  To help prevent nausea and vomiting after your treatment, we encourage you to take your nausea medication as directed   If you develop nausea and vomiting that is not controlled by your nausea medication, call the clinic.   BELOW ARE SYMPTOMS THAT SHOULD BE REPORTED IMMEDIATELY:  *FEVER GREATER THAN 100.5 F  *CHILLS WITH OR WITHOUT FEVER  NAUSEA AND VOMITING THAT IS NOT CONTROLLED WITH YOUR NAUSEA MEDICATION  *UNUSUAL SHORTNESS OF BREATH  *UNUSUAL BRUISING OR BLEEDING  TENDERNESS IN MOUTH AND THROAT WITH OR WITHOUT PRESENCE OF ULCERS  *URINARY PROBLEMS  *BOWEL PROBLEMS  UNUSUAL RASH Items with * indicate a potential emergency and should be followed up as soon as possible.  Feel free to call the clinic should you have any questions or concerns. The clinic phone number is [phone].  Please show the CHEMO ALERT CARD at check-in to the Emergency Department and triage nurse.  Gemcitabine injection What is this medicine? GEMCITABINE (jem SYE ta been) is a chemotherapy drug. This medicine is used to treat many types of cancer like breast cancer, lung cancer, pancreatic cancer, and ovarian cancer. This medicine may be used for other purposes; ask your health care provider or pharmacist if you have questions. COMMON BRAND NAME(S): Gemzar, Infugem What should I tell my health care provider before I take this medicine? They need to know if you have any of these conditions:  blood disorders  infection  kidney disease  liver disease  lung or breathing disease, like asthma  recent or ongoing radiation therapy  an unusual or allergic reaction to gemcitabine, other chemotherapy, other medicines, foods, dyes, or preservatives  pregnant or trying to get pregnant  breast-feeding How should I use this  medicine? This drug is given as an infusion into a vein. It is administered in a hospital or clinic by a specially trained health care professional. Talk to your pediatrician regarding the use of this medicine in children. Special care may be needed. Overdosage: If you think you have taken too much of this medicine contact a poison control center or emergency room at once. NOTE: This medicine is only for you. Do not share this medicine with others. What if I miss a dose? It is important not to miss your dose. Call your doctor or health care professional if you are unable to keep an appointment. What may interact with this medicine?  medicines to increase blood counts like filgrastim, pegfilgrastim, sargramostim  some other chemotherapy drugs like cisplatin  vaccines Talk to your doctor or health care professional before taking any of these medicines:  acetaminophen  aspirin  ibuprofen  ketoprofen  naproxen This list may not describe all possible interactions. Give your health care provider a list of all the medicines, herbs, non-prescription drugs, or dietary supplements you use. Also tell them if you smoke, drink alcohol, or use illegal drugs. Some items may interact with your medicine. What should I watch for while using this medicine? Visit your doctor for checks on your progress. This drug may make you feel generally unwell. This is not uncommon, as chemotherapy can affect healthy cells as well as cancer cells. Report any side effects. Continue your course of treatment even though you feel ill unless your doctor tells you to stop. In some cases, you may be given additional medicines to help with side effects. Follow all directions for their use.   Call your doctor or health care professional for advice if you get a fever, chills or sore throat, or other symptoms of a cold or flu. Do not treat yourself. This drug decreases your body's ability to fight infections. Try to avoid being  around people who are sick. This medicine may increase your risk to bruise or bleed. Call your doctor or health care professional if you notice any unusual bleeding. Be careful brushing and flossing your teeth or using a toothpick because you may get an infection or bleed more easily. If you have any dental work done, tell your dentist you are receiving this medicine. Avoid taking products that contain aspirin, acetaminophen, ibuprofen, naproxen, or ketoprofen unless instructed by your doctor. These medicines may hide a fever. Do not become pregnant while taking this medicine or for 6 months after stopping it. Women should inform their doctor if they wish to become pregnant or think they might be pregnant. Men should not father a child while taking this medicine and for 3 months after stopping it. There is a potential for serious side effects to an unborn child. Talk to your health care professional or pharmacist for more information. Do not breast-feed an infant while taking this medicine or for at least 1 week after stopping it. Men should inform their doctors if they wish to father a child. This medicine may lower sperm counts. Talk with your doctor or health care professional if you are concerned about your fertility. What side effects may I notice from receiving this medicine? Side effects that you should report to your doctor or health care professional as soon as possible:  allergic reactions like skin rash, itching or hives, swelling of the face, lips, or tongue  breathing problems  pain, redness, or irritation at site where injected  signs and symptoms of a dangerous change in heartbeat or heart rhythm like chest pain; dizziness; fast or irregular heartbeat; palpitations; feeling faint or lightheaded, falls; breathing problems  signs of decreased platelets or bleeding - bruising, pinpoint red spots on the skin, black, tarry stools, blood in the urine  signs of decreased red blood cells -  unusually weak or tired, feeling faint or lightheaded, falls  signs of infection - fever or chills, cough, sore throat, pain or difficulty passing urine  signs and symptoms of kidney injury like trouble passing urine or change in the amount of urine  signs and symptoms of liver injury like dark yellow or brown urine; general ill feeling or flu-like symptoms; light-colored stools; loss of appetite; nausea; right upper belly pain; unusually weak or tired; yellowing of the eyes or skin  swelling of ankles, feet, hands Side effects that usually do not require medical attention (report to your doctor or health care professional if they continue or are bothersome):  constipation  diarrhea  hair loss  loss of appetite  nausea  rash  vomiting This list may not describe all possible side effects. Call your doctor for medical advice about side effects. You may report side effects to FDA at 1-800-FDA-1088. Where should I keep my medicine? This drug is given in a hospital or clinic and will not be stored at home. NOTE: This sheet is a summary. It may not cover all possible information. If you have questions about this medicine, talk to your doctor, pharmacist, or health care provider.  2020 Elsevier/Gold Standard (2017-09-16 18:06:11)  Carboplatin injection What is this medicine? CARBOPLATIN (KAR boe pla tin) is a chemotherapy drug. It targets fast dividing   cells, like cancer cells, and causes these cells to die. This medicine is used to treat ovarian cancer and many other cancers. This medicine may be used for other purposes; ask your health care provider or pharmacist if you have questions. COMMON BRAND NAME(S): Paraplatin What should I tell my health care provider before I take this medicine? They need to know if you have any of these conditions:  blood disorders  hearing problems  kidney disease  recent or ongoing radiation therapy  an unusual or allergic reaction to  carboplatin, cisplatin, other chemotherapy, other medicines, foods, dyes, or preservatives  pregnant or trying to get pregnant  breast-feeding How should I use this medicine? This drug is usually given as an infusion into a vein. It is administered in a hospital or clinic by a specially trained health care professional. Talk to your pediatrician regarding the use of this medicine in children. Special care may be needed. Overdosage: If you think you have taken too much of this medicine contact a poison control center or emergency room at once. NOTE: This medicine is only for you. Do not share this medicine with others. What if I miss a dose? It is important not to miss a dose. Call your doctor or health care professional if you are unable to keep an appointment. What may interact with this medicine?  medicines for seizures  medicines to increase blood counts like filgrastim, pegfilgrastim, sargramostim  some antibiotics like amikacin, gentamicin, neomycin, streptomycin, tobramycin  vaccines Talk to your doctor or health care professional before taking any of these medicines:  acetaminophen  aspirin  ibuprofen  ketoprofen  naproxen This list may not describe all possible interactions. Give your health care provider a list of all the medicines, herbs, non-prescription drugs, or dietary supplements you use. Also tell them if you smoke, drink alcohol, or use illegal drugs. Some items may interact with your medicine. What should I watch for while using this medicine? Your condition will be monitored carefully while you are receiving this medicine. You will need important blood work done while you are taking this medicine. This drug may make you feel generally unwell. This is not uncommon, as chemotherapy can affect healthy cells as well as cancer cells. Report any side effects. Continue your course of treatment even though you feel ill unless your doctor tells you to stop. In some  cases, you may be given additional medicines to help with side effects. Follow all directions for their use. Call your doctor or health care professional for advice if you get a fever, chills or sore throat, or other symptoms of a cold or flu. Do not treat yourself. This drug decreases your body's ability to fight infections. Try to avoid being around people who are sick. This medicine may increase your risk to bruise or bleed. Call your doctor or health care professional if you notice any unusual bleeding. Be careful brushing and flossing your teeth or using a toothpick because you may get an infection or bleed more easily. If you have any dental work done, tell your dentist you are receiving this medicine. Avoid taking products that contain aspirin, acetaminophen, ibuprofen, naproxen, or ketoprofen unless instructed by your doctor. These medicines may hide a fever. Do not become pregnant while taking this medicine. Women should inform their doctor if they wish to become pregnant or think they might be pregnant. There is a potential for serious side effects to an unborn child. Talk to your health care professional or pharmacist for   more information. Do not breast-feed an infant while taking this medicine. What side effects may I notice from receiving this medicine? Side effects that you should report to your doctor or health care professional as soon as possible:  allergic reactions like skin rash, itching or hives, swelling of the face, lips, or tongue  signs of infection - fever or chills, cough, sore throat, pain or difficulty passing urine  signs of decreased platelets or bleeding - bruising, pinpoint red spots on the skin, black, tarry stools, nosebleeds  signs of decreased red blood cells - unusually weak or tired, fainting spells, lightheadedness  breathing problems  changes in hearing  changes in vision  chest pain  high blood pressure  low blood counts - This drug may decrease  the number of white blood cells, red blood cells and platelets. You may be at increased risk for infections and bleeding.  nausea and vomiting  pain, swelling, redness or irritation at the injection site  pain, tingling, numbness in the hands or feet  problems with balance, talking, walking  trouble passing urine or change in the amount of urine Side effects that usually do not require medical attention (report to your doctor or health care professional if they continue or are bothersome):  hair loss  loss of appetite  metallic taste in the mouth or changes in taste This list may not describe all possible side effects. Call your doctor for medical advice about side effects. You may report side effects to FDA at 1-800-FDA-1088. Where should I keep my medicine? This drug is given in a hospital or clinic and will not be stored at home. NOTE: This sheet is a summary. It may not cover all possible information. If you have questions about this medicine, talk to your doctor, pharmacist, or health care provider.  2020 Elsevier/Gold Standard (2007-09-28 14:38:05)   

## 2019-11-12 LAB — CA 125: Cancer Antigen (CA) 125: 40.2 U/mL — ABNORMAL HIGH (ref 0.0–38.1)

## 2019-11-14 ENCOUNTER — Telehealth: Payer: Self-pay | Admitting: Emergency Medicine

## 2019-11-14 NOTE — Telephone Encounter (Signed)
-----   Message from Priscille Loveless, RN sent at 11/11/2019  8:50 AM EDT ----- Regarding: First time Chemo [place] First Gemzar and subsequent Carboplatin

## 2019-11-14 NOTE — Telephone Encounter (Signed)
Chemo f/u call, spoke with patient.  Pt denies any questions or concerns at this time, reports only mild fatigue as a side effect.  Pt aware of next appts on 11/18/19.

## 2019-11-18 ENCOUNTER — Encounter: Payer: Self-pay | Admitting: Hematology and Oncology

## 2019-11-18 ENCOUNTER — Inpatient Hospital Stay (HOSPITAL_BASED_OUTPATIENT_CLINIC_OR_DEPARTMENT_OTHER): Payer: Medicare Other | Admitting: Hematology and Oncology

## 2019-11-18 ENCOUNTER — Telehealth: Payer: Self-pay | Admitting: Hematology and Oncology

## 2019-11-18 ENCOUNTER — Inpatient Hospital Stay: Payer: Medicare Other

## 2019-11-18 ENCOUNTER — Other Ambulatory Visit: Payer: Self-pay

## 2019-11-18 DIAGNOSIS — C561 Malignant neoplasm of right ovary: Secondary | ICD-10-CM

## 2019-11-18 DIAGNOSIS — Z7189 Other specified counseling: Secondary | ICD-10-CM

## 2019-11-18 DIAGNOSIS — Z5111 Encounter for antineoplastic chemotherapy: Secondary | ICD-10-CM | POA: Diagnosis not present

## 2019-11-18 DIAGNOSIS — D61818 Other pancytopenia: Secondary | ICD-10-CM

## 2019-11-18 DIAGNOSIS — I10 Essential (primary) hypertension: Secondary | ICD-10-CM

## 2019-11-18 DIAGNOSIS — E119 Type 2 diabetes mellitus without complications: Secondary | ICD-10-CM | POA: Diagnosis not present

## 2019-11-18 LAB — CMP (CANCER CENTER ONLY)
ALT: 41 U/L (ref 0–44)
AST: 39 U/L (ref 15–41)
Albumin: 3.5 g/dL (ref 3.5–5.0)
Alkaline Phosphatase: 64 U/L (ref 38–126)
Anion gap: 12 (ref 5–15)
BUN: 21 mg/dL (ref 8–23)
CO2: 24 mmol/L (ref 22–32)
Calcium: 8.9 mg/dL (ref 8.9–10.3)
Chloride: 103 mmol/L (ref 98–111)
Creatinine: 0.86 mg/dL (ref 0.44–1.00)
GFR, Est AFR Am: >60 mL/min (ref >60)
GFR, Estimated: >60 mL/min (ref >60)
Glucose, Bld: 159 mg/dL — ABNORMAL HIGH (ref 70–99)
Potassium: 4.3 mmol/L (ref 3.5–5.1)
Sodium: 139 mmol/L (ref 135–145)
Total Bilirubin: 0.3 mg/dL (ref 0.3–1.2)
Total Protein: 7 g/dL (ref 6.5–8.1)

## 2019-11-18 LAB — CBC WITH DIFFERENTIAL (CANCER CENTER ONLY)
Abs Immature Granulocytes: 0 10*3/uL (ref 0.00–0.07)
Basophils Absolute: 0 10*3/uL (ref 0.0–0.1)
Basophils Relative: 1 %
Eosinophils Absolute: 0 10*3/uL (ref 0.0–0.5)
Eosinophils Relative: 3 %
HCT: 29.5 % — ABNORMAL LOW (ref 36.0–46.0)
Hemoglobin: 9.1 g/dL — ABNORMAL LOW (ref 12.0–15.0)
Immature Granulocytes: 0 %
Lymphocytes Relative: 47 %
Lymphs Abs: 0.7 10*3/uL (ref 0.7–4.0)
MCH: 30.6 pg (ref 26.0–34.0)
MCHC: 30.8 g/dL (ref 30.0–36.0)
MCV: 99.3 fL (ref 80.0–100.0)
Monocytes Absolute: 0.1 10*3/uL (ref 0.1–1.0)
Monocytes Relative: 4 %
Neutro Abs: 0.7 10*3/uL — ABNORMAL LOW (ref 1.7–7.7)
Neutrophils Relative %: 45 %
Platelet Count: 98 10*3/uL — ABNORMAL LOW (ref 150–400)
RBC: 2.97 MIL/uL — ABNORMAL LOW (ref 3.87–5.11)
RDW: 15.1 % (ref 11.5–15.5)
WBC Count: 1.6 10*3/uL — ABNORMAL LOW (ref 4.0–10.5)
nRBC: 0 % (ref 0.0–0.2)

## 2019-11-18 MED ORDER — SODIUM CHLORIDE 0.9% FLUSH
10.0000 mL | Freq: Once | INTRAVENOUS | Status: AC
  Administered 2019-11-18: 10 mL
  Filled 2019-11-18: qty 10

## 2019-11-18 NOTE — Assessment & Plan Note (Signed)
Unfortunately, she developed significant pancytopenia from treatment We will have to cancel her treatment today I recommend omit day 8 from cycle 1 Starting cycle 2, I plan to reduce the dose of gemcitabine further  I shared with her previous additional molecular testing PD-L1 is negative She is not a candidate for immunotherapy Estrogen receptor testing came back strongly positive at 80% We discussed the link between obesity and outcome of estrogen sensitive cancer We discussed the importance of dietary modification and weight loss

## 2019-11-18 NOTE — Progress Notes (Signed)
[place] OFFICE PROGRESS NOTE  Patient Care Team: Lilian Coma., MD as PCP - General (Internal Medicine)  ASSESSMENT & PLAN:  Ovarian cancer on right [facility]) Unfortunately, she developed significant pancytopenia from treatment We will have to cancel her treatment today I recommend omit day 8 from cycle 1 Starting cycle 2, I plan to reduce the dose of gemcitabine further  I shared with her previous additional molecular testing PD-L1 is negative She is not a candidate for immunotherapy Estrogen receptor testing came back strongly positive at 80% We discussed the link between obesity and outcome of estrogen sensitive cancer We discussed the importance of dietary modification and weight loss  Pancytopenia, acquired ([place]) She has severe pancytopenia due to treatment I plan to keep the dose of carboplatin the same but reduce gemcitabine dose further in the future We discussed neutropenic precaution She does not need transfusion support  Type 2 diabetes mellitus without complication ([place]) We discussed the importance of dietary modification and weight loss strategies to combat estrogen sensitive solid organ tumor  Essential hypertension Her blood pressure control is satisfactory We will continue close observation.   No orders of the defined types were placed in this encounter.   All questions were answered. The patient knows to call the clinic with any problems, questions or concerns. The total time spent in the appointment was 30 minutes encounter with patients including review of chart and various tests results, discussions about plan of care and coordination of care plan   Heath Lark, MD 11/18/2019 9:03 AM  INTERVAL HISTORY: Please see below for problem oriented charting. She returns with her husband for cycle 1, day 8 of treatment She is feeling well Denies nausea or changes in bowel habits with treatment The patient denies any recent signs or symptoms of  bleeding such as spontaneous epistaxis, hematuria or hematochezia. 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    03/20/2017 Imaging   GYN ultrasound was performed this revealed a small fundus measuring 5 cm there are 2 small fibroids within the fundus with the largest measuring 1.3 cm Within the endometrium there is a small amount a fluid the endometrial wall was slightly thickened at 4.8 mm the ultrasound was consistent with a small endometrial polyp  The right adnexa measured 7.4 x 6.7 cm contained a both a cystic and solid mass with a small amount of shadowing This is consistent with a dermoid tumor the left ovary was 2.6 x 1.5 x 1.0 cm There is no other adnexal masses no free fluid within the abdomen no ascites     04/10/2017 Surgery   Operation: Operative Laparscopy with  Peritoneal biopsy 2 hysteroscopy D&C Surgeon: Lynder Parents MD  Specimens: Peritoneal biopsy and cellular washings  Complications: None FIndings Small amount of ascites was found cellular washings were sent There was peritoneal studding And omental caking Even in steep Trendelenburg the ovaries and uterus were unable to be visualized due to the small intestines in the lower pelvis  04/10/2017 Pathology Results   Biopsy from [facility] was nondiagnostic.  Peritoneum biopsy did not reveal malignancy.  Endometrial biopsy also did not reveal malignancy.   04/13/2017 Tumor Marker    Patient's tumor was tested for the following markers: CA-125 Results of the tumor marker test revealed 324.1   04/20/2017 Tumor Marker   Patient's tumor was tested for the following markers: CA-125 Results of the tumor marker test revealed 345.6   04/22/2017 Pathology Results   Peritoneum, biopsy - CARCINOMA. - SEE COMMENT.   04/22/2017 Procedure   CT-guided core biopsy performed of peritoneal tumor in the left lower quadrant.   04/24/2017 - 09/25/2017 Chemotherapy   She received carboplatin and Taxol x 3 cycles followed by interval surgical debulking on 12/31. Chemotherapy was subsequently resumed for 3 more cycles   06/05/2017 Tumor Marker   Patient's tumor was tested for the following markers: CA-125 Results of the tumor marker test revealed 27.5   06/17/2017 Imaging   1. Considerable improvement in the prior omental caking, which now presents mainly as an indistinct stranding with slight nodularity along the left upper quadrant omentum. 2. Stable appearance of right ovarian mass with fatty and calcific elements classic for a dermoid. 3. There is accentuated enhancement in the walls of the common hepatic duct and common bile duct. A low-grade cholangitis is not readily excluded. 4. Other imaging findings of potential clinical significance: Mild cardiomegaly. Aortic Atherosclerosis (ICD10-I70.0). Lumbar scoliosis, spondylosis, degenerative disc disease, and pars defects at L5. These cause impingement at L5-S1 and lesser impingement at L3-4 and L4-5. 5. 65% compression fracture at L1 slightly worsened than on the prior exam, with associated vertebral sclerosis and posterior retropulsion.   07/06/2017 Surgery   Bilateral Salpingo-Oophorectomy W/Omentectomy, Total Abd Hysterectomy & Radical Dissection For Debulking Right - Ureterolysis, With Or Without Repositioning Of Ureter For Retroperitoneal Fibrosis   07/06/2017 Pathology Results   A: Omentum, omentectomy - Omentum with minimal  residual carcinoma, consistent with high grade serous carcinoma (microscopic; stage ypT3a) - Extensive treatment effect with calcifications, fibrosis, histiocytes, and giant cell response - CRS score 3  B: Uterus with cervix and left ovary and fallopian tube, hysterectomy and left salpingo-oophorectomy - Cervix: Ectocervix and endocervix with no dysplasia or malignancy identified - Endometrium: Inactive with no hyperplasia, atypia, or malignancy identified - Myometrium: Adenomyosis and benign leiomyoma, size 1.2 cm - Serosa: Focal calcification and histiocytes suggestive of treatment effect, with no viable carcinoma identified - Ovary, left: Physiologic changes and no carcinoma identified - Fallopian tube, left: 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 and no malignancy identified  C: Ovary and fallopian tube, right, salpingo-oophorectomy - Right fallopian tube with focal residual carcinoma, consistent with high grade serous carcinoma, and serous tubal intraepithelial carcinoma (STIC) - Associated calcifications and histiocytes suggestive of treatment response - Right ovary with calcifications and histiocytes suggestive of treatment effect, with no definite viable carcinoma identified - Mature cystic teratoma, 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 - See synoptic report and comment   08/14/2017 Tumor Marker   Patient's tumor was tested for the following markers: CA-125 Results of the tumor marker test revealed 14.6   09/04/2017 Tumor Marker   Patient's tumor was tested for the following markers: CA-125 Results of the tumor marker test revealed 12.5   10/03/2017 Genetic Testing   The Common Hereditary Cancer Panel offered by [facility] includes sequencing and/or deletion duplication testing of the following 47 genes: APC, ATM, AXIN2, BARD1, BMPR1A, BRCA1, BRCA2, BRIP1, CDH1, CDKN2A (p14ARF), CDKN2A (  p16INK4a), CKD4, CHEK2, CTNNA1, DICER1, EPCAM (Deletion/duplication testing only), GREM1 (promoter region  deletion/duplication testing only), KIT, MEN1, MLH1, MSH2, MSH3, MSH6, MUTYH, NBN, NF1, NHTL1, PALB2, PDGFRA, PMS2, POLD1, POLE, PTEN, RAD50, RAD51C, RAD51D, SDHB, SDHC, SDHD, SMAD4, SMARCA4. STK11, TP53, TSC1, TSC2, and VHL.  The following genes were evaluated for sequence changes only: SDHA and HOXB13 c.251G>A variant only.  Results: Negative, no pathogenic variants identified.  The date of this test report is 10/03/2017.    10/28/2017 Imaging   1. Stable pulmonary nodules. No new or progressive findings. 2. No residual or recurrent omental disease is identified. No obvious peritoneal surface disease. 3. Increasing soft tissue density and ill-defined interstitial changes around the abdominal wall surgical incision. This may be progressive postoperative changes or granulation tissue but attention on future scans to exclude the possibility of tumor. 4. Status post resection of right adnexal mass. No pelvic mass or pelvic adenopathy. 5. Stable T12 compression fracture.   01/25/2018 Tumor Marker   Patient's tumor was tested for the following markers: CA-125 Results of the tumor marker test revealed 9.2   12/10/2018 Tumor Marker   Patient's tumor was tested for the following markers: CA-125 Results of the tumor marker test revealed 212   12/22/2018 Imaging   1. Interval development of multiple soft tissue lesions in the gastrocolic ligament, mesentery, and along the peritoneal surface. These soft tissue lesions measure up to 6.5 cm in size. Associated interval development of moderate volume ascites in the abdomen and pelvis. Imaging features are consistent with metastatic disease. 2. Soft tissue projects through the rectus sheath in the right paraumbilical region, likely herniated small bowel as there is a large amount of decompressed small bowel does deep to this finding. However, metastatic deposit at this location is also possibility.  3. Trace pneumobilia in the left liver suggests prior  sphincterotomy. 4.  Aortic Atherosclerois (ICD10-170.0)   12/30/2018 Procedure   Successful ultrasound-guided therapeutic paracentesis yielding 4.5 liters of peritoneal fluid.   12/30/2018 Procedure   Technically successful right IJ power-injectable port catheter placement. Ready for routine use.   12/31/2018 -  Chemotherapy   The patient had carboplatin and taxol for chemotherapy treatment.     01/21/2019 Tumor Marker   Patient's tumor was tested for the following markers: CA-125 Results of the tumor marker test revealed 408   02/11/2019 Tumor Marker   Patient's tumor was tested for the following markers: CA-125 Results of the tumor marker test revealed 646.   03/02/2019 Imaging   1. Marked improvement in peritoneal carcinomatosis/ascites, without complete resolution. 2.  Aortic atherosclerosis (ICD10-170.0).     03/25/2019 Tumor Marker   Patient's tumor was tested for the following markers: CA-125 Results of the tumor marker test revealed 24.1.   04/15/2019 Tumor Marker   Patient's tumor was tested for the following markers: CA-125 Results of the tumor marker test revealed 15.3   05/18/2019 Imaging   1. There is progressive, nearly complete resolution of peritoneal and omental nodularity seen on prior examination with very faint residual peritoneal and omental haziness (series 2, image 50, 39).   2. Interval resolution of ascites.   3.  Status post hysterectomy and oophorectomy.   4.  Coronary artery disease.  Aortic Atherosclerosis (ICD10-I70.0).      Tumor Marker   Patient's tumor was tested for the following markers: CA-125 Results of the tumor marker test revealed 11.2.   06/10/2019 Tumor Marker   Patient's tumor was tested for the following markers: CA-125 Results of the  tumor marker test revealed 9.1   07/29/2019 Tumor Marker   Patient's tumor was tested for the following markers: CA-125 Results of the tumor marker test revealed 8.8   08/18/2019 Imaging   1. No  evidence of new or progressive metastatic disease in the abdomen or pelvis. Stable small nodular soft tissue focus at the right umbilicus. No new peritoneal implants. No ascites. 2. Chronic findings include: Aortic Atherosclerosis (ICD10-I70.0). Coronary atherosclerosis. Chronic moderate T12 vertebral compression fractures.   09/09/2019 Tumor Marker   Patient's tumor was tested for the following markers: CA-125 Results of the tumor marker test revealed 11.4.   10/21/2019 Tumor Marker   Patient's tumor was tested for the following markers: CA-125 Results of the tumor marker test revealed 21.5   10/31/2019 Imaging   1. New hepatic and peritoneal metastatic disease. 2.  Aortic atherosclerosis (ICD10-I70.0).     11/11/2019 -  Chemotherapy   The patient had carboplatin and gemzar for chemotherapy treatment.     11/11/2019 Tumor Marker   Patient's tumor was tested for the following markers: CA-125 Results of the tumor marker test revealed 40.2    Genetic Testing   Patient has genetic testing done for PD-L1. Results revealed patient has the following:  PD-L1 staining in tumor cells (TC): <1% PD-L1 staining in tumor-associated immune cells (IC): 0% PD-L1 combined positive score (CPS): <1%   11/11/2019 -  Chemotherapy   The patient had carboplatin and gemzar for chemotherapy treatment.       REVIEW OF SYSTEMS:   Constitutional: Denies fevers, chills or abnormal weight loss Eyes: Denies blurriness of vision Ears, nose, mouth, throat, and face: Denies mucositis or sore throat Respiratory: Denies cough, dyspnea or wheezes Cardiovascular: Denies palpitation, chest discomfort or lower extremity swelling Gastrointestinal:  Denies nausea, heartburn or change in bowel habits Skin: Denies abnormal skin rashes Lymphatics: Denies new lymphadenopathy or easy bruising Neurological:Denies numbness, tingling or new weaknesses Behavioral/Psych: Mood is stable, no new changes  All other systems were  reviewed with the patient and are negative.  I have reviewed the past medical history, past surgical history, social history and family history with the patient and they are unchanged from previous note.  ALLERGIES:  is allergic to aspirin; nsaids; and tramadol.  MEDICATIONS:  Current Outpatient Medications  Medication Sig Dispense Refill  . acetaminophen (TYLENOL) 650 MG CR tablet Take 1,300 mg by mouth every 8 (eight) hours.     Marland Kitchen alendronate (FOSAMAX) 70 MG tablet Take 70 mg by mouth every Sunday.     Marland Kitchen atorvastatin (LIPITOR) 80 MG tablet Take 40 mg by mouth at bedtime.     . Cholecalciferol (VITAMIN D3) 2000 units TABS Take 2,000 Units by mouth daily.    . citalopram (CELEXA) 10 MG tablet Take 10 mg by mouth at bedtime    . ferrous sulfate 325 (65 FE) MG tablet Take 325 mg by mouth daily. 3 hours after meal and 30 min before a meal    . gabapentin (NEURONTIN) 300 MG capsule Take by mouth.    . Glucosamine Sulfate 500 MG TABS Take 500 mg by mouth 2 (two) times daily.     . hydrochlorothiazide (HYDRODIURIL) 25 MG tablet Take 1 tablet (25 mg total) by mouth daily. 90 tablet 3  . lidocaine-prilocaine (EMLA) cream Apply to affected area once 30 g 3  . loratadine (CLARITIN) 10 MG tablet Take 10 mg by mouth at bedtime.     Marland Kitchen losartan (COZAAR) 25 MG tablet Take 25 mg by  mouth daily.     . meclizine (ANTIVERT) 25 MG tablet Take 25 mg by mouth 2 (two) times daily as needed for dizziness.     . metFORMIN (GLUCOPHAGE) 1000 MG tablet Take 1,000 mg by mouth 2 (two) times daily with a meal.     . omeprazole (PRILOSEC) 40 MG capsule Take 40 mg by mouth daily.     . ondansetron (ZOFRAN) 8 MG tablet Take 1 tablet (8 mg total) by mouth every 8 (eight) hours as needed for refractory nausea / vomiting. 30 tablet 1  . pioglitazone (ACTOS) 15 MG tablet Take 15 mg by mouth at bedtime.     . prochlorperazine (COMPAZINE) 10 MG tablet Take 1 tablet (10 mg total) by mouth every 6 (six) hours as needed (Nausea or  vomiting). 30 tablet 1  . Pyridoxine HCl (VITAMIN B-6 PO) Take 50 mg by mouth 1 day or 1 dose.    . vitamin B-12 (CYANOCOBALAMIN) 1000 MCG tablet Take 1,000 mcg by mouth daily.      No current facility-administered medications for this visit.   Facility-Administered Medications Ordered in Other Visits  Medication Dose Route Frequency Provider Last Rate Last Admin  . sodium chloride flush (NS) 0.9 % injection 10 mL  10 mL Intracatheter Once [name], [place] Agostinelli, MD        PHYSICAL EXAMINATION: ECOG PERFORMANCE STATUS: 2 - Symptomatic, <50% confined to bed  Vitals:   11/18/19 0831  BP: 128/60  Pulse: 84  Resp: 18  Temp: 98.9 F (37.2 C)  SpO2: 99%   Filed Weights   11/18/19 0831  Weight: 221 lb 6.4 oz (100.4 kg)    GENERAL:alert, no distress and comfortable NEURO: alert & oriented x 3 with fluent speech, no focal motor/sensory deficits  LABORATORY DATA:  I have reviewed the data as listed    Component Value Date/Time   NA 139 11/18/2019 0820   NA 139 06/05/2017 0746   K 4.3 11/18/2019 0820   K 4.1 06/05/2017 0746   CL 103 11/18/2019 0820   CO2 24 11/18/2019 0820   CO2 21 (L) 06/05/2017 0746   GLUCOSE 159 (H) 11/18/2019 0820   GLUCOSE 279 (H) 06/05/2017 0746   BUN 21 11/18/2019 0820   BUN 15.0 06/05/2017 0746   CREATININE 0.86 11/18/2019 0820   CREATININE 0.9 06/05/2017 0746   CALCIUM 8.9 11/18/2019 0820   CALCIUM 10.1 06/05/2017 0746   PROT 7.0 11/18/2019 0820   PROT 8.1 06/05/2017 0746   ALBUMIN 3.5 11/18/2019 0820   ALBUMIN 4.0 06/05/2017 0746   AST 39 11/18/2019 0820   AST 22 06/05/2017 0746   ALT 41 11/18/2019 0820   ALT 36 06/05/2017 0746   ALKPHOS 64 11/18/2019 0820   ALKPHOS 98 06/05/2017 0746   BILITOT 0.3 11/18/2019 0820   BILITOT 0.44 06/05/2017 0746   GFRNONAA >60 11/18/2019 0820   GFRAA >60 11/18/2019 0820    No results found for: SPEP, UPEP  Lab Results  Component Value Date   WBC 1.6 (L) 11/18/2019   NEUTROABS 0.7 (L) 11/18/2019   HGB 9.1 (L)  11/18/2019   HCT 29.5 (L) 11/18/2019   MCV 99.3 11/18/2019   PLT 98 (L) 11/18/2019      Chemistry      Component Value Date/Time   NA 139 11/18/2019 0820   NA 139 06/05/2017 0746   K 4.3 11/18/2019 0820   K 4.1 06/05/2017 0746   CL 103 11/18/2019 0820   CO2 24 11/18/2019 0820  CO2 21 (L) 06/05/2017 0746   BUN 21 11/18/2019 0820   BUN 15.0 06/05/2017 0746   CREATININE 0.86 11/18/2019 0820   CREATININE 0.9 06/05/2017 0746      Component Value Date/Time   CALCIUM 8.9 11/18/2019 0820   CALCIUM 10.1 06/05/2017 0746   ALKPHOS 64 11/18/2019 0820   ALKPHOS 98 06/05/2017 0746   AST 39 11/18/2019 0820   AST 22 06/05/2017 0746   ALT 41 11/18/2019 0820   ALT 36 06/05/2017 0746   BILITOT 0.3 11/18/2019 0820   BILITOT 0.44 06/05/2017 0746

## 2019-11-18 NOTE — Telephone Encounter (Signed)
Scheduled per 5/14 sch msg. Pt aware of appts.

## 2019-11-18 NOTE — Assessment & Plan Note (Signed)
Her blood pressure control is satisfactory We will continue close observation.

## 2019-11-18 NOTE — Assessment & Plan Note (Signed)
We discussed the importance of dietary modification and weight loss strategies to combat estrogen sensitive solid organ tumor

## 2019-11-18 NOTE — Assessment & Plan Note (Signed)
She has severe pancytopenia due to treatment I plan to keep the dose of carboplatin the same but reduce gemcitabine dose further in the future We discussed neutropenic precaution She does not need transfusion support

## 2019-11-25 ENCOUNTER — Telehealth: Payer: Self-pay

## 2019-11-25 NOTE — Telephone Encounter (Signed)
Called and given below message. She verbalized understanding. 

## 2019-11-25 NOTE — Telephone Encounter (Signed)
She called and ask about her new diet she doing now. Are pork rinds okay? No sugar, no carbs and they have protein. Is stevia the sweetner okay? She has lost about 10 lbs and is enjoying her food more. She said, thank you Dr. [name] for all of the help.

## 2019-11-25 NOTE — Telephone Encounter (Signed)
Yes, those are fine but minimize Stevia if possible Congratulations for the weight loss

## 2019-12-02 ENCOUNTER — Inpatient Hospital Stay: Payer: Medicare Other

## 2019-12-02 ENCOUNTER — Other Ambulatory Visit: Payer: Medicare Other

## 2019-12-02 ENCOUNTER — Inpatient Hospital Stay (HOSPITAL_BASED_OUTPATIENT_CLINIC_OR_DEPARTMENT_OTHER): Payer: Medicare Other | Admitting: Hematology and Oncology

## 2019-12-02 ENCOUNTER — Ambulatory Visit: Payer: Medicare Other | Admitting: Hematology and Oncology

## 2019-12-02 ENCOUNTER — Other Ambulatory Visit: Payer: Self-pay

## 2019-12-02 ENCOUNTER — Other Ambulatory Visit: Payer: Self-pay | Admitting: Hematology and Oncology

## 2019-12-02 ENCOUNTER — Encounter: Payer: Self-pay | Admitting: Hematology and Oncology

## 2019-12-02 ENCOUNTER — Ambulatory Visit: Payer: Medicare Other

## 2019-12-02 DIAGNOSIS — C561 Malignant neoplasm of right ovary: Secondary | ICD-10-CM

## 2019-12-02 DIAGNOSIS — Z7189 Other specified counseling: Secondary | ICD-10-CM

## 2019-12-02 DIAGNOSIS — D61818 Other pancytopenia: Secondary | ICD-10-CM

## 2019-12-02 DIAGNOSIS — I10 Essential (primary) hypertension: Secondary | ICD-10-CM

## 2019-12-02 DIAGNOSIS — E119 Type 2 diabetes mellitus without complications: Secondary | ICD-10-CM | POA: Diagnosis not present

## 2019-12-02 DIAGNOSIS — Z5111 Encounter for antineoplastic chemotherapy: Secondary | ICD-10-CM | POA: Diagnosis not present

## 2019-12-02 LAB — CMP (CANCER CENTER ONLY)
ALT: 24 U/L (ref 0–44)
AST: 28 U/L (ref 15–41)
Albumin: 3.8 g/dL (ref 3.5–5.0)
Alkaline Phosphatase: 62 U/L (ref 38–126)
Anion gap: 12 (ref 5–15)
BUN: 30 mg/dL — ABNORMAL HIGH (ref 8–23)
CO2: 21 mmol/L — ABNORMAL LOW (ref 22–32)
Calcium: 9.3 mg/dL (ref 8.9–10.3)
Chloride: 106 mmol/L (ref 98–111)
Creatinine: 0.91 mg/dL (ref 0.44–1.00)
GFR, Est AFR Am: >60 mL/min (ref >60)
GFR, Estimated: >60 mL/min (ref >60)
Glucose, Bld: 114 mg/dL — ABNORMAL HIGH (ref 70–99)
Potassium: 3.5 mmol/L (ref 3.5–5.1)
Sodium: 139 mmol/L (ref 135–145)
Total Bilirubin: 0.3 mg/dL (ref 0.3–1.2)
Total Protein: 7.3 g/dL (ref 6.5–8.1)

## 2019-12-02 LAB — CBC WITH DIFFERENTIAL (CANCER CENTER ONLY)
Abs Immature Granulocytes: 0.01 10*3/uL (ref 0.00–0.07)
Basophils Absolute: 0 10*3/uL (ref 0.0–0.1)
Basophils Relative: 1 %
Eosinophils Absolute: 0 10*3/uL (ref 0.0–0.5)
Eosinophils Relative: 1 %
HCT: 28.7 % — ABNORMAL LOW (ref 36.0–46.0)
Hemoglobin: 9 g/dL — ABNORMAL LOW (ref 12.0–15.0)
Immature Granulocytes: 0 %
Lymphocytes Relative: 35 %
Lymphs Abs: 0.8 10*3/uL (ref 0.7–4.0)
MCH: 30.9 pg (ref 26.0–34.0)
MCHC: 31.4 g/dL (ref 30.0–36.0)
MCV: 98.6 fL (ref 80.0–100.0)
Monocytes Absolute: 0.4 10*3/uL (ref 0.1–1.0)
Monocytes Relative: 17 %
Neutro Abs: 1 10*3/uL — ABNORMAL LOW (ref 1.7–7.7)
Neutrophils Relative %: 46 %
Platelet Count: 171 10*3/uL (ref 150–400)
RBC: 2.91 MIL/uL — ABNORMAL LOW (ref 3.87–5.11)
RDW: 16.7 % — ABNORMAL HIGH (ref 11.5–15.5)
WBC Count: 2.3 10*3/uL — ABNORMAL LOW (ref 4.0–10.5)
nRBC: 0 % (ref 0.0–0.2)

## 2019-12-02 MED ORDER — SODIUM CHLORIDE 0.9% FLUSH
10.0000 mL | Freq: Once | INTRAVENOUS | Status: AC
  Administered 2019-12-02: 10 mL
  Filled 2019-12-02: qty 10

## 2019-12-02 MED ORDER — FAMOTIDINE IN NACL 20-0.9 MG/50ML-% IV SOLN
INTRAVENOUS | Status: AC
  Filled 2019-12-02: qty 50

## 2019-12-02 MED ORDER — PALONOSETRON HCL INJECTION 0.25 MG/5ML
INTRAVENOUS | Status: AC
  Filled 2019-12-02: qty 5

## 2019-12-02 MED ORDER — SODIUM CHLORIDE 0.9% FLUSH
10.0000 mL | INTRAVENOUS | Status: DC | PRN
  Administered 2019-12-02: 10 mL
  Filled 2019-12-02: qty 10

## 2019-12-02 MED ORDER — DIPHENHYDRAMINE HCL 50 MG/ML IJ SOLN
50.0000 mg | Freq: Once | INTRAMUSCULAR | Status: AC
  Administered 2019-12-02: 50 mg via INTRAVENOUS

## 2019-12-02 MED ORDER — SODIUM CHLORIDE 0.9 % IV SOLN
446.8000 mg | Freq: Once | INTRAVENOUS | Status: AC
  Administered 2019-12-02: 450 mg via INTRAVENOUS
  Filled 2019-12-02: qty 45

## 2019-12-02 MED ORDER — SODIUM CHLORIDE 0.9 % IV SOLN
150.0000 mg | Freq: Once | INTRAVENOUS | Status: AC
  Administered 2019-12-02: 150 mg via INTRAVENOUS
  Filled 2019-12-02: qty 150

## 2019-12-02 MED ORDER — DIPHENHYDRAMINE HCL 50 MG/ML IJ SOLN
INTRAMUSCULAR | Status: AC
  Filled 2019-12-02: qty 1

## 2019-12-02 MED ORDER — SODIUM CHLORIDE 0.9 % IV SOLN
Freq: Once | INTRAVENOUS | Status: AC
  Filled 2019-12-02: qty 250

## 2019-12-02 MED ORDER — SODIUM CHLORIDE 0.9 % IV SOLN
10.0000 mg | Freq: Once | INTRAVENOUS | Status: AC
  Administered 2019-12-02: 10 mg via INTRAVENOUS
  Filled 2019-12-02: qty 10

## 2019-12-02 MED ORDER — FAMOTIDINE IN NACL 20-0.9 MG/50ML-% IV SOLN
20.0000 mg | Freq: Once | INTRAVENOUS | Status: AC
  Administered 2019-12-02: 20 mg via INTRAVENOUS

## 2019-12-02 MED ORDER — HEPARIN SOD (PORK) LOCK FLUSH 100 UNIT/ML IV SOLN
500.0000 [IU] | Freq: Once | INTRAVENOUS | Status: AC | PRN
  Administered 2019-12-02: 500 [IU]
  Filled 2019-12-02: qty 5

## 2019-12-02 MED ORDER — PALONOSETRON HCL INJECTION 0.25 MG/5ML
0.2500 mg | Freq: Once | INTRAVENOUS | Status: AC
  Administered 2019-12-02: 0.25 mg via INTRAVENOUS

## 2019-12-02 MED ORDER — SODIUM CHLORIDE 0.9 % IV SOLN
480.0000 mg/m2 | Freq: Once | INTRAVENOUS | Status: AC
  Administered 2019-12-02: 1064 mg via INTRAVENOUS
  Filled 2019-12-02: qty 27.98

## 2019-12-02 NOTE — Progress Notes (Signed)
[facility] OFF[facility] PROGRESS NOTE  Patient Care Team: Lilian Coma., MD as PCP - General (Internal Medicine)  ASSESSMENT & PLAN:  Ovarian cancer on right [facility]) Unfortunately, she tolerated treatment poorly with persistent pancytopenia I recommend further dose adjustment I also recommend modifying her treatment to receive treatment on days 1 and 15, and rest day 8 and 22 for cycle of every 28 days instead of 21 days She is in agreement to proceed   Pancytopenia, acquired [facility]) She has severe pancytopenia due to treatment I plan to keep the dose of carboplatin the same but reduce gemcitabine dose further in the future We discussed neutropenic precaution She does not need transfusion support  Type 2 diabetes mellitus without complication ([place]) Her blood sugar is improving with recent dietary modification and weight loss strategies I congratulated her efforts I recommend she continues with dietary modification She has appointment to see primary care doctor soon for medication adjustment  Essential hypertension Her blood pressure is running low due to recent weight loss I recommend reducing hydrochlorothiazide to half a dose Once her prescription runs out, she may stop taking blood pressure medication   No orders of the defined types were placed in this encounter.   All questions were answered. The patient knows to call the clinic with any problems, questions or concerns. The total time spent in the appointment was 30 minutes encounter with patients including review of chart and various tests results, discussions about plan of care and coordination of care plan   Heath Lark, MD 12/02/2019 5:20 PM  INTERVAL HISTORY: Please see below for problem oriented charting. She returns with her husband for further follow-up She is doing well Tolerated chemotherapy well without nausea She has intentional weight loss from dietary modification Her blood sugar is improving and  her blood pressure is now running low No recent constipation The patient denies any recent signs or symptoms of bleeding such as spontaneous epistaxis, hematuria or hematochezia.  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    03/20/2017 Imaging   GYN ultrasound was performed this revealed a small fundus measuring 5 cm there are 2 small fibroids within the fundus with the largest measuring 1.3 cm Within the endometrium there is a small amount a fluid the endometrial wall was slightly thickened at 4.8 mm the ultrasound was consistent with a small endometrial polyp  The right adnexa measured 7.4 x 6.7 cm contained a both a cystic and solid mass with a small amount of shadowing This is consistent with a dermoid tumor the left ovary was 2.6 x 1.5 x 1.0 cm There is no other adnexal masses no free fluid within the abdomen no ascites     04/10/2017 Surgery   Operation: Operative Laparscopy with  Peritoneal biopsy 2 hysteroscopy D&C Surgeon: Lynder Parents MD  Specimens: Peritoneal biopsy and cellular washings  Complications: None FIndings Small amount of ascites was found cellular washings were sent There was peritoneal studding And omental caking Even in steep Trendelenburg the ovaries and uterus were unable to be visualized due to the small intestines in  the lower pelvis     04/10/2017 Pathology Results   Biopsy from [facility] was nondiagnostic.  Peritoneum biopsy did not reveal malignancy.  Endometrial biopsy also did not reveal malignancy.   04/13/2017 Tumor  Marker   Patient's tumor was tested for the following markers: CA-125 Results of the tumor marker test revealed 324.1   04/20/2017 Tumor Marker   Patient's tumor was tested for the following markers: CA-125 Results of the tumor marker test revealed 345.6   04/22/2017 Pathology Results   Peritoneum, biopsy - CARCINOMA. - SEE COMMENT.   04/22/2017 Procedure   CT-guided core biopsy performed of peritoneal tumor in the left lower quadrant.   04/24/2017 - 09/25/2017 Chemotherapy   She received carboplatin and Taxol x 3 cycles followed by interval surgical debulking on 12/31. Chemotherapy was subsequently resumed for 3 more cycles   06/05/2017 Tumor Marker   Patient's tumor was tested for the following markers: CA-125 Results of the tumor marker test revealed 27.5   06/17/2017 Imaging   1. Considerable improvement in the prior omental caking, which now presents mainly as an indistinct stranding with slight nodularity along the left upper quadrant omentum. 2. Stable appearance of right ovarian mass with fatty and calcific elements classic for a dermoid. 3. There is accentuated enhancement in the walls of the common hepatic duct and common bile duct. A low-grade cholangitis is not readily excluded. 4. Other imaging findings of potential clinical significance: Mild cardiomegaly. Aortic Atherosclerosis (ICD10-I70.0). Lumbar scoliosis, spondylosis, degenerative disc disease, and pars defects at L5. These cause impingement at L5-S1 and lesser impingement at L3-4 and L4-5. 5. 65% compression fracture at L1 slightly worsened than on the prior exam, with associated vertebral sclerosis and posterior retropulsion.   07/06/2017 Surgery   Bilateral Salpingo-Oophorectomy W/Omentectomy, Total Abd Hysterectomy & Radical Dissection For Debulking Right - Ureterolysis, With Or Without Repositioning Of Ureter For Retroperitoneal Fibrosis   07/06/2017 Pathology Results   A: Omentum, omentectomy - Omentum  with minimal residual carcinoma, consistent with high grade serous carcinoma (microscopic; stage ypT3a) - Extensive treatment effect with calcifications, fibrosis, histiocytes, and giant cell response - CRS score 3  B: Uterus with cervix and left ovary and fallopian tube, hysterectomy and left salpingo-oophorectomy - Cervix: Ectocervix and endocervix with no dysplasia or malignancy identified - Endometrium: Inactive with no hyperplasia, atypia, or malignancy identified - Myometrium: Adenomyosis and benign leiomyoma, size 1.2 cm - Serosa: Focal calcification and histiocytes suggestive of treatment effect, with no viable carcinoma identified - Ovary, left: Physiologic changes and no carcinoma identified - Fallopian tube, left: 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 and no malignancy identified  C: Ovary and fallopian tube, right, salpingo-oophorectomy - Right fallopian tube with focal residual carcinoma, consistent with high grade serous carcinoma, and serous tubal intraepithelial carcinoma (STIC) - Associated calcifications and histiocytes suggestive of treatment response - Right ovary with calcifications and histiocytes suggestive of treatment effect, with no definite viable carcinoma identified - Mature cystic teratoma, 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 - See synoptic report and comment   08/14/2017 Tumor Marker   Patient's tumor was tested for the following markers: CA-125 Results of the tumor marker test revealed 14.6   09/04/2017 Tumor Marker   Patient's tumor was tested for the following markers: CA-125 Results of the tumor marker test revealed 12.5   10/03/2017 Genetic Testing   The Common Hereditary Cancer Panel offered by [facility] includes sequencing and/or deletion duplication testing of the following 47 genes: APC, ATM, AXIN2, BARD1, BMPR1A,  BRCA1, BRCA2, BRIP1, CDH1, CDKN2A (p14ARF), CDKN2A (p16INK4a), CKD4, CHEK2, CTNNA1, DICER1, EPCAM (Deletion/duplication testing only), GREM1 (promoter region  deletion/duplication testing only), KIT, MEN1, MLH1, MSH2, MSH3, MSH6, MUTYH, NBN, NF1, NHTL1, PALB2, PDGFRA, PMS2, POLD1, POLE, PTEN, RAD50, RAD51C, RAD51D, SDHB, SDHC, SDHD, SMAD4, SMARCA4. STK11, TP53, TSC1, TSC2, and VHL.  The following genes were evaluated for sequence changes only: SDHA and HOXB13 c.251G>A variant only.  Results: Negative, no pathogenic variants identified.  The date of this test report is 10/03/2017.    10/28/2017 Imaging   1. Stable pulmonary nodules. No new or progressive findings. 2. No residual or recurrent omental disease is identified. No obvious peritoneal surface disease. 3. Increasing soft tissue density and ill-defined interstitial changes around the abdominal wall surgical incision. This may be progressive postoperative changes or granulation tissue but attention on future scans to exclude the possibility of tumor. 4. Status post resection of right adnexal mass. No pelvic mass or pelvic adenopathy. 5. Stable T12 compression fracture.   01/25/2018 Tumor Marker   Patient's tumor was tested for the following markers: CA-125 Results of the tumor marker test revealed 9.2   12/10/2018 Tumor Marker   Patient's tumor was tested for the following markers: CA-125 Results of the tumor marker test revealed 212   12/22/2018 Imaging   1. Interval development of multiple soft tissue lesions in the gastrocolic ligament, mesentery, and along the peritoneal surface. These soft tissue lesions measure up to 6.5 cm in size. Associated interval development of moderate volume ascites in the abdomen and pelvis. Imaging features are consistent with metastatic disease. 2. Soft tissue projects through the rectus sheath in the right paraumbilical region, likely herniated small bowel as there is a large amount of decompressed small bowel does deep to this finding. However, metastatic deposit at this location is also possibility.  3. Trace pneumobilia in the left liver suggests prior  sphincterotomy. 4.  Aortic Atherosclerois (ICD10-170.0)   12/30/2018 Procedure   Successful ultrasound-guided therapeutic paracentesis yielding 4.5 liters of peritoneal fluid.   12/30/2018 Procedure   Technically successful right IJ power-injectable port catheter placement. Ready for routine use.   12/31/2018 -  Chemotherapy   The patient had carboplatin and taxol for chemotherapy treatment.     01/21/2019 Tumor Marker   Patient's tumor was tested for the following markers: CA-125 Results of the tumor marker test revealed 408   02/11/2019 Tumor Marker   Patient's tumor was tested for the following markers: CA-125 Results of the tumor marker test revealed 646.   03/02/2019 Imaging   1. Marked improvement in peritoneal carcinomatosis/ascites, without complete resolution. 2.  Aortic atherosclerosis (ICD10-170.0).     03/25/2019 Tumor Marker   Patient's tumor was tested for the following markers: CA-125 Results of the tumor marker test revealed 24.1.   04/15/2019 Tumor Marker   Patient's tumor was tested for the following markers: CA-125 Results of the tumor marker test revealed 15.3   05/18/2019 Imaging   1. There is progressive, nearly complete resolution of peritoneal and omental nodularity seen on prior examination with very faint residual peritoneal and omental haziness (series 2, image 50, 39).   2. Interval resolution of ascites.   3.  Status post hysterectomy and oophorectomy.   4.  Coronary artery disease.  Aortic Atherosclerosis (ICD10-I70.0).      Tumor Marker   Patient's tumor was tested for the following markers: CA-125 Results of the tumor marker test revealed 11.2.   06/10/2019 Tumor Marker   Patient's tumor was tested for  the following markers: CA-125 Results of the tumor marker test revealed 9.1   07/29/2019 Tumor Marker   Patient's tumor was tested for the following markers: CA-125 Results of the tumor marker test revealed 8.8   08/18/2019 Imaging   1. No  evidence of new or progressive metastatic disease in the abdomen or pelvis. Stable small nodular soft tissue focus at the right umbilicus. No new peritoneal implants. No ascites. 2. Chronic findings include: Aortic Atherosclerosis (ICD10-I70.0). Coronary atherosclerosis. Chronic moderate T12 vertebral compression fractures.   09/09/2019 Tumor Marker   Patient's tumor was tested for the following markers: CA-125 Results of the tumor marker test revealed 11.4.   10/21/2019 Tumor Marker   Patient's tumor was tested for the following markers: CA-125 Results of the tumor marker test revealed 21.5   10/31/2019 Imaging   1. New hepatic and peritoneal metastatic disease. 2.  Aortic atherosclerosis (ICD10-I70.0).     11/11/2019 -  Chemotherapy   The patient had carboplatin and gemzar for chemotherapy treatment.     11/11/2019 Tumor Marker   Patient's tumor was tested for the following markers: CA-125 Results of the tumor marker test revealed 40.2    Genetic Testing   Patient has genetic testing done for PD-L1. Results revealed patient has the following:  PD-L1 staining in tumor cells (TC): <1% PD-L1 staining in tumor-associated immune cells (IC): 0% PD-L1 combined positive score (CPS): <1%   11/11/2019 -  Chemotherapy   The patient had carboplatin and gemzar for chemotherapy treatment.       REVIEW OF SYSTEMS:   Constitutional: Denies fevers, chills or abnormal weight loss Eyes: Denies blurriness of vision Ears, nose, mouth, throat, and face: Denies mucositis or sore throat Respiratory: Denies cough, dyspnea or wheezes Cardiovascular: Denies palpitation, chest discomfort or lower extremity swelling Gastrointestinal:  Denies nausea, heartburn or change in bowel habits Skin: Denies abnormal skin rashes Lymphatics: Denies new lymphadenopathy or easy bruising Neurological:Denies numbness, tingling or new weaknesses Behavioral/Psych: Mood is stable, no new changes  All other systems were  reviewed with the patient and are negative.  I have reviewed the past medical history, past surgical history, social history and family history with the patient and they are unchanged from previous note.  ALLERGIES:  is allergic to aspirin; nsaids; and tramadol.  MEDICATIONS:  Current Outpatient Medications  Medication Sig Dispense Refill  . acetaminophen (TYLENOL) 650 MG CR tablet Take 1,300 mg by mouth every 8 (eight) hours.     Marland Kitchen alendronate (FOSAMAX) 70 MG tablet Take 70 mg by mouth every Sunday.     Marland Kitchen atorvastatin (LIPITOR) 80 MG tablet Take 40 mg by mouth at bedtime.     . Cholecalciferol (VITAMIN D3) 2000 units TABS Take 2,000 Units by mouth daily.    . citalopram (CELEXA) 10 MG tablet Take 10 mg by mouth at bedtime    . ferrous sulfate 325 (65 FE) MG tablet Take 325 mg by mouth daily. 3 hours after meal and 30 min before a meal    . gabapentin (NEURONTIN) 300 MG capsule Take by mouth.    . Glucosamine Sulfate 500 MG TABS Take 500 mg by mouth 2 (two) times daily.     . hydrochlorothiazide (HYDRODIURIL) 25 MG tablet Take 1 tablet (25 mg total) by mouth daily. 90 tablet 3  . lidocaine-prilocaine (EMLA) cream Apply to affected area once 30 g 3  . loratadine (CLARITIN) 10 MG tablet Take 10 mg by mouth at bedtime.     Marland Kitchen losartan (COZAAR)  25 MG tablet Take 25 mg by mouth daily.     . meclizine (ANTIVERT) 25 MG tablet Take 25 mg by mouth 2 (two) times daily as needed for dizziness.     . metFORMIN (GLUCOPHAGE) 1000 MG tablet Take 1,000 mg by mouth 2 (two) times daily with a meal.     . omeprazole (PRILOSEC) 40 MG capsule Take 40 mg by mouth daily.     . ondansetron (ZOFRAN) 8 MG tablet Take 1 tablet (8 mg total) by mouth every 8 (eight) hours as needed for refractory nausea / vomiting. 30 tablet 1  . pioglitazone (ACTOS) 15 MG tablet Take 15 mg by mouth at bedtime.     . prochlorperazine (COMPAZINE) 10 MG tablet Take 1 tablet (10 mg total) by mouth every 6 (six) hours as needed (Nausea or  vomiting). 30 tablet 1  . Pyridoxine HCl (VITAMIN B-6 PO) Take 50 mg by mouth 1 day or 1 dose.    . vitamin B-12 (CYANOCOBALAMIN) 1000 MCG tablet Take 1,000 mcg by mouth daily.      No current facility-administered medications for this visit.   Facility-Administered Medications Ordered in Other Visits  Medication Dose Route Frequency Provider Last Rate Last Admin  . sodium chloride flush (NS) 0.9 % injection 10 mL  10 mL Intracatheter Once Thanh Mottern, MD      . sodium chloride flush (NS) 0.9 % injection 10 mL  10 mL Intracatheter PRN [name], Lizette Pazos, MD   10 mL at 12/02/19 1223    PHYSICAL EXAMINATION: ECOG PERFORMANCE STATUS: 1 - Symptomatic but completely ambulatory  Vitals:   12/02/19 0840  BP: (!) 114/55  Pulse: 84  Resp: 18  Temp: 98.2 F (36.8 C)  SpO2: 99%   Filed Weights   12/02/19 0840  Weight: 212 lb 6.4 oz (96.3 kg)    GENERAL:alert, no distress and comfortable NEURO: alert & oriented x 3 with fluent speech, no focal motor/sensory deficits  LABORATORY DATA:  I have reviewed the data as listed    Component Value Date/Time   NA 139 12/02/2019 0756   NA 139 06/05/2017 0746   K 3.5 12/02/2019 0756   K 4.1 06/05/2017 0746   CL 106 12/02/2019 0756   CO2 21 (L) 12/02/2019 0756   CO2 21 (L) 06/05/2017 0746   GLUCOSE 114 (H) 12/02/2019 0756   GLUCOSE 279 (H) 06/05/2017 0746   BUN 30 (H) 12/02/2019 0756   BUN 15.0 06/05/2017 0746   CREATININE 0.91 12/02/2019 0756   CREATININE 0.9 06/05/2017 0746   CALCIUM 9.3 12/02/2019 0756   CALCIUM 10.1 06/05/2017 0746   PROT 7.3 12/02/2019 0756   PROT 8.1 06/05/2017 0746   ALBUMIN 3.8 12/02/2019 0756   ALBUMIN 4.0 06/05/2017 0746   AST 28 12/02/2019 0756   AST 22 06/05/2017 0746   ALT 24 12/02/2019 0756   ALT 36 06/05/2017 0746   ALKPHOS 62 12/02/2019 0756   ALKPHOS 98 06/05/2017 0746   BILITOT 0.3 12/02/2019 0756   BILITOT 0.44 06/05/2017 0746   GFRNONAA >60 12/02/2019 0756   GFRAA >60 12/02/2019 0756    No  results found for: SPEP, UPEP  Lab Results  Component Value Date   WBC 2.3 (L) 12/02/2019   NEUTROABS 1.0 (L) 12/02/2019   HGB 9.0 (L) 12/02/2019   HCT 28.7 (L) 12/02/2019   MCV 98.6 12/02/2019   PLT 171 12/02/2019      Chemistry      Component Value Date/Time   NA 139  12/02/2019 0756   NA 139 06/05/2017 0746   K 3.5 12/02/2019 0756   K 4.1 06/05/2017 0746   CL 106 12/02/2019 0756   CO2 21 (L) 12/02/2019 0756   CO2 21 (L) 06/05/2017 0746   BUN 30 (H) 12/02/2019 0756   BUN 15.0 06/05/2017 0746   CREATININE 0.91 12/02/2019 0756   CREATININE 0.9 06/05/2017 0746      Component Value Date/Time   CALCIUM 9.3 12/02/2019 0756   CALCIUM 10.1 06/05/2017 0746   ALKPHOS 62 12/02/2019 0756   ALKPHOS 98 06/05/2017 0746   AST 28 12/02/2019 0756   AST 22 06/05/2017 0746   ALT 24 12/02/2019 0756   ALT 36 06/05/2017 0746   BILITOT 0.3 12/02/2019 0756   BILITOT 0.44 06/05/2017 0746

## 2019-12-02 NOTE — Assessment & Plan Note (Signed)
Her blood sugar is improving with recent dietary modification and weight loss strategies I congratulated her efforts I recommend she continues with dietary modification She has appointment to see primary care doctor soon for medication adjustment 

## 2019-12-02 NOTE — Patient Instructions (Signed)

## 2019-12-02 NOTE — Assessment & Plan Note (Signed)
She has severe pancytopenia due to treatment I plan to keep the dose of carboplatin the same but reduce gemcitabine dose further in the future We discussed neutropenic precaution She does not need transfusion support

## 2019-12-02 NOTE — Assessment & Plan Note (Signed)
Her blood pressure is running low due to recent weight loss I recommend reducing hydrochlorothiazide to half a dose Once her prescription runs out, she may stop taking blood pressure medication

## 2019-12-02 NOTE — Patient Instructions (Signed)
[facility] Discharge Instructions for Patients Receiving Chemotherapy  Today you received the following chemotherapy agents Gemzar; Carboplatin  To help prevent nausea and vomiting after your treatment, we encourage you to take your nausea medication as directed   If you develop nausea and vomiting that is not controlled by your nausea medication, call the clinic.   BELOW ARE SYMPTOMS THAT SHOULD BE REPORTED IMMEDIATELY:  *FEVER GREATER THAN 100.5 F  *CHILLS WITH OR WITHOUT FEVER  NAUSEA AND VOMITING THAT IS NOT CONTROLLED WITH YOUR NAUSEA MEDICATION  *UNUSUAL SHORTNESS OF BREATH  *UNUSUAL BRUISING OR BLEEDING  TENDERNESS IN MOUTH AND THROAT WITH OR WITHOUT PRESENCE OF ULCERS  *URINARY PROBLEMS  *BOWEL PROBLEMS  UNUSUAL RASH Items with * indicate a potential emergency and should be followed up as soon as possible.  Feel free to call the clinic should you have any questions or concerns. The clinic phone number is [phone].  Please show the CHEMO ALERT CARD at check-in to the Emergency Department and triage nurse.  Gemcitabine injection What is this medicine? GEMCITABINE (jem SYE ta been) is a chemotherapy drug. This medicine is used to treat many types of cancer like breast cancer, lung cancer, pancreatic cancer, and ovarian cancer. This medicine may be used for other purposes; ask your health care provider or pharmacist if you have questions. COMMON BRAND NAME(S): Gemzar, Infugem What should I tell my health care provider before I take this medicine? They need to know if you have any of these conditions:  blood disorders  infection  kidney disease  liver disease  lung or breathing disease, like asthma  recent or ongoing radiation therapy  an unusual or allergic reaction to gemcitabine, other chemotherapy, other medicines, foods, dyes, or preservatives  pregnant or trying to get pregnant  breast-feeding How should I use this  medicine? This drug is given as an infusion into a vein. It is administered in a hospital or clinic by a specially trained health care professional. Talk to your pediatrician regarding the use of this medicine in children. Special care may be needed. Overdosage: If you think you have taken too much of this medicine contact a poison control center or emergency room at once. NOTE: This medicine is only for you. Do not share this medicine with others. What if I miss a dose? It is important not to miss your dose. Call your doctor or health care professional if you are unable to keep an appointment. What may interact with this medicine?  medicines to increase blood counts like filgrastim, pegfilgrastim, sargramostim  some other chemotherapy drugs like cisplatin  vaccines Talk to your doctor or health care professional before taking any of these medicines:  acetaminophen  aspirin  ibuprofen  ketoprofen  naproxen This list may not describe all possible interactions. Give your health care provider a list of all the medicines, herbs, non-prescription drugs, or dietary supplements you use. Also tell them if you smoke, drink alcohol, or use illegal drugs. Some items may interact with your medicine. What should I watch for while using this medicine? Visit your doctor for checks on your progress. This drug may make you feel generally unwell. This is not uncommon, as chemotherapy can affect healthy cells as well as cancer cells. Report any side effects. Continue your course of treatment even though you feel ill unless your doctor tells you to stop. In some cases, you may be given additional medicines to help with side effects. Follow all directions for their use.   Call your doctor or health care professional for advice if you get a fever, chills or sore throat, or other symptoms of a cold or flu. Do not treat yourself. This drug decreases your body's ability to fight infections. Try to avoid being  around people who are sick. This medicine may increase your risk to bruise or bleed. Call your doctor or health care professional if you notice any unusual bleeding. Be careful brushing and flossing your teeth or using a toothpick because you may get an infection or bleed more easily. If you have any dental work done, tell your dentist you are receiving this medicine. Avoid taking products that contain aspirin, acetaminophen, ibuprofen, naproxen, or ketoprofen unless instructed by your doctor. These medicines may hide a fever. Do not become pregnant while taking this medicine or for 6 months after stopping it. Women should inform their doctor if they wish to become pregnant or think they might be pregnant. Men should not father a child while taking this medicine and for 3 months after stopping it. There is a potential for serious side effects to an unborn child. Talk to your health care professional or pharmacist for more information. Do not breast-feed an infant while taking this medicine or for at least 1 week after stopping it. Men should inform their doctors if they wish to father a child. This medicine may lower sperm counts. Talk with your doctor or health care professional if you are concerned about your fertility. What side effects may I notice from receiving this medicine? Side effects that you should report to your doctor or health care professional as soon as possible:  allergic reactions like skin rash, itching or hives, swelling of the face, lips, or tongue  breathing problems  pain, redness, or irritation at site where injected  signs and symptoms of a dangerous change in heartbeat or heart rhythm like chest pain; dizziness; fast or irregular heartbeat; palpitations; feeling faint or lightheaded, falls; breathing problems  signs of decreased platelets or bleeding - bruising, pinpoint red spots on the skin, black, tarry stools, blood in the urine  signs of decreased red blood cells -  unusually weak or tired, feeling faint or lightheaded, falls  signs of infection - fever or chills, cough, sore throat, pain or difficulty passing urine  signs and symptoms of kidney injury like trouble passing urine or change in the amount of urine  signs and symptoms of liver injury like dark yellow or brown urine; general ill feeling or flu-like symptoms; light-colored stools; loss of appetite; nausea; right upper belly pain; unusually weak or tired; yellowing of the eyes or skin  swelling of ankles, feet, hands Side effects that usually do not require medical attention (report to your doctor or health care professional if they continue or are bothersome):  constipation  diarrhea  hair loss  loss of appetite  nausea  rash  vomiting This list may not describe all possible side effects. Call your doctor for medical advice about side effects. You may report side effects to FDA at 1-800-FDA-1088. Where should I keep my medicine? This drug is given in a hospital or clinic and will not be stored at home. NOTE: This sheet is a summary. It may not cover all possible information. If you have questions about this medicine, talk to your doctor, pharmacist, or health care provider.  2020 Elsevier/Gold Standard (2017-09-16 18:06:11)  Carboplatin injection What is this medicine? CARBOPLATIN (KAR boe pla tin) is a chemotherapy drug. It targets fast dividing   cells, like cancer cells, and causes these cells to die. This medicine is used to treat ovarian cancer and many other cancers. This medicine may be used for other purposes; ask your health care provider or pharmacist if you have questions. COMMON BRAND NAME(S): Paraplatin What should I tell my health care provider before I take this medicine? They need to know if you have any of these conditions:  blood disorders  hearing problems  kidney disease  recent or ongoing radiation therapy  an unusual or allergic reaction to  carboplatin, cisplatin, other chemotherapy, other medicines, foods, dyes, or preservatives  pregnant or trying to get pregnant  breast-feeding How should I use this medicine? This drug is usually given as an infusion into a vein. It is administered in a hospital or clinic by a specially trained health care professional. Talk to your pediatrician regarding the use of this medicine in children. Special care may be needed. Overdosage: If you think you have taken too much of this medicine contact a poison control center or emergency room at once. NOTE: This medicine is only for you. Do not share this medicine with others. What if I miss a dose? It is important not to miss a dose. Call your doctor or health care professional if you are unable to keep an appointment. What may interact with this medicine?  medicines for seizures  medicines to increase blood counts like filgrastim, pegfilgrastim, sargramostim  some antibiotics like amikacin, gentamicin, neomycin, streptomycin, tobramycin  vaccines Talk to your doctor or health care professional before taking any of these medicines:  acetaminophen  aspirin  ibuprofen  ketoprofen  naproxen This list may not describe all possible interactions. Give your health care provider a list of all the medicines, herbs, non-prescription drugs, or dietary supplements you use. Also tell them if you smoke, drink alcohol, or use illegal drugs. Some items may interact with your medicine. What should I watch for while using this medicine? Your condition will be monitored carefully while you are receiving this medicine. You will need important blood work done while you are taking this medicine. This drug may make you feel generally unwell. This is not uncommon, as chemotherapy can affect healthy cells as well as cancer cells. Report any side effects. Continue your course of treatment even though you feel ill unless your doctor tells you to stop. In some  cases, you may be given additional medicines to help with side effects. Follow all directions for their use. Call your doctor or health care professional for advice if you get a fever, chills or sore throat, or other symptoms of a cold or flu. Do not treat yourself. This drug decreases your body's ability to fight infections. Try to avoid being around people who are sick. This medicine may increase your risk to bruise or bleed. Call your doctor or health care professional if you notice any unusual bleeding. Be careful brushing and flossing your teeth or using a toothpick because you may get an infection or bleed more easily. If you have any dental work done, tell your dentist you are receiving this medicine. Avoid taking products that contain aspirin, acetaminophen, ibuprofen, naproxen, or ketoprofen unless instructed by your doctor. These medicines may hide a fever. Do not become pregnant while taking this medicine. Women should inform their doctor if they wish to become pregnant or think they might be pregnant. There is a potential for serious side effects to an unborn child. Talk to your health care professional or pharmacist for   more information. Do not breast-feed an infant while taking this medicine. What side effects may I notice from receiving this medicine? Side effects that you should report to your doctor or health care professional as soon as possible:  allergic reactions like skin rash, itching or hives, swelling of the face, lips, or tongue  signs of infection - fever or chills, cough, sore throat, pain or difficulty passing urine  signs of decreased platelets or bleeding - bruising, pinpoint red spots on the skin, black, tarry stools, nosebleeds  signs of decreased red blood cells - unusually weak or tired, fainting spells, lightheadedness  breathing problems  changes in hearing  changes in vision  chest pain  high blood pressure  low blood counts - This drug may decrease  the number of white blood cells, red blood cells and platelets. You may be at increased risk for infections and bleeding.  nausea and vomiting  pain, swelling, redness or irritation at the injection site  pain, tingling, numbness in the hands or feet  problems with balance, talking, walking  trouble passing urine or change in the amount of urine Side effects that usually do not require medical attention (report to your doctor or health care professional if they continue or are bothersome):  hair loss  loss of appetite  metallic taste in the mouth or changes in taste This list may not describe all possible side effects. Call your doctor for medical advice about side effects. You may report side effects to FDA at 1-800-FDA-1088. Where should I keep my medicine? This drug is given in a hospital or clinic and will not be stored at home. NOTE: This sheet is a summary. It may not cover all possible information. If you have questions about this medicine, talk to your doctor, pharmacist, or health care provider.  2020 Elsevier/Gold Standard (2007-09-28 14:38:05)   

## 2019-12-02 NOTE — Assessment & Plan Note (Signed)
Unfortunately, she tolerated treatment poorly with persistent pancytopenia I recommend further dose adjustment I also recommend modifying her treatment to receive treatment on days 1 and 15, and rest day 8 and 22 for cycle of every 28 days instead of 21 days She is in agreement to proceed

## 2019-12-06 ENCOUNTER — Ambulatory Visit: Payer: Medicare Other | Admitting: Hematology and Oncology

## 2019-12-06 ENCOUNTER — Other Ambulatory Visit: Payer: Medicare Other

## 2019-12-06 ENCOUNTER — Ambulatory Visit: Payer: Medicare Other

## 2019-12-06 ENCOUNTER — Telehealth: Payer: Self-pay | Admitting: Hematology and Oncology

## 2019-12-06 NOTE — Telephone Encounter (Signed)
Called pt to review appts made by MX per 5/28 sch msg. Pt confirmed appt date and time.

## 2019-12-16 ENCOUNTER — Encounter: Payer: Self-pay | Admitting: Hematology and Oncology

## 2019-12-16 ENCOUNTER — Inpatient Hospital Stay: Payer: Medicare Other | Attending: Gynecology

## 2019-12-16 ENCOUNTER — Other Ambulatory Visit: Payer: Self-pay

## 2019-12-16 ENCOUNTER — Inpatient Hospital Stay: Payer: Medicare Other

## 2019-12-16 ENCOUNTER — Other Ambulatory Visit: Payer: Self-pay | Admitting: Hematology and Oncology

## 2019-12-16 ENCOUNTER — Inpatient Hospital Stay (HOSPITAL_BASED_OUTPATIENT_CLINIC_OR_DEPARTMENT_OTHER): Payer: Medicare Other | Admitting: Hematology and Oncology

## 2019-12-16 VITALS — BP 112/41 | HR 84 | Temp 98.6°F | Resp 18 | Ht 67.0 in | Wt 212.4 lb

## 2019-12-16 VITALS — BP 114/58 | HR 79 | Temp 97.6°F | Resp 18

## 2019-12-16 DIAGNOSIS — C561 Malignant neoplasm of right ovary: Secondary | ICD-10-CM

## 2019-12-16 DIAGNOSIS — D6481 Anemia due to antineoplastic chemotherapy: Secondary | ICD-10-CM | POA: Diagnosis not present

## 2019-12-16 DIAGNOSIS — T451X5A Adverse effect of antineoplastic and immunosuppressive drugs, initial encounter: Secondary | ICD-10-CM

## 2019-12-16 DIAGNOSIS — Z5111 Encounter for antineoplastic chemotherapy: Secondary | ICD-10-CM | POA: Diagnosis not present

## 2019-12-16 DIAGNOSIS — D61818 Other pancytopenia: Secondary | ICD-10-CM

## 2019-12-16 DIAGNOSIS — I10 Essential (primary) hypertension: Secondary | ICD-10-CM

## 2019-12-16 DIAGNOSIS — Z7189 Other specified counseling: Secondary | ICD-10-CM

## 2019-12-16 LAB — CBC WITH DIFFERENTIAL (CANCER CENTER ONLY)
Abs Immature Granulocytes: 0.01 10*3/uL (ref 0.00–0.07)
Basophils Absolute: 0 10*3/uL (ref 0.0–0.1)
Basophils Relative: 0 %
Eosinophils Absolute: 0 10*3/uL (ref 0.0–0.5)
Eosinophils Relative: 1 %
HCT: 23.8 % — ABNORMAL LOW (ref 36.0–46.0)
Hemoglobin: 7.6 g/dL — ABNORMAL LOW (ref 12.0–15.0)
Immature Granulocytes: 0 %
Lymphocytes Relative: 33 %
Lymphs Abs: 0.9 10*3/uL (ref 0.7–4.0)
MCH: 31.4 pg (ref 26.0–34.0)
MCHC: 31.9 g/dL (ref 30.0–36.0)
MCV: 98.3 fL (ref 80.0–100.0)
Monocytes Absolute: 0.5 10*3/uL (ref 0.1–1.0)
Monocytes Relative: 17 %
Neutro Abs: 1.3 10*3/uL — ABNORMAL LOW (ref 1.7–7.7)
Neutrophils Relative %: 49 %
Platelet Count: 103 10*3/uL — ABNORMAL LOW (ref 150–400)
RBC: 2.42 MIL/uL — ABNORMAL LOW (ref 3.87–5.11)
RDW: 17.4 % — ABNORMAL HIGH (ref 11.5–15.5)
WBC Count: 2.7 10*3/uL — ABNORMAL LOW (ref 4.0–10.5)
nRBC: 0.7 % — ABNORMAL HIGH (ref 0.0–0.2)

## 2019-12-16 LAB — ABO/RH: ABO/RH(D): O POS

## 2019-12-16 LAB — CMP (CANCER CENTER ONLY)
ALT: 47 U/L — ABNORMAL HIGH (ref 0–44)
AST: 40 U/L (ref 15–41)
Albumin: 3.7 g/dL (ref 3.5–5.0)
Alkaline Phosphatase: 68 U/L (ref 38–126)
Anion gap: 12 (ref 5–15)
BUN: 31 mg/dL — ABNORMAL HIGH (ref 8–23)
CO2: 22 mmol/L (ref 22–32)
Calcium: 9.3 mg/dL (ref 8.9–10.3)
Chloride: 105 mmol/L (ref 98–111)
Creatinine: 1.01 mg/dL — ABNORMAL HIGH (ref 0.44–1.00)
GFR, Est AFR Am: >60 mL/min (ref >60)
GFR, Estimated: 58 mL/min — ABNORMAL LOW (ref >60)
Glucose, Bld: 108 mg/dL — ABNORMAL HIGH (ref 70–99)
Potassium: 4 mmol/L (ref 3.5–5.1)
Sodium: 139 mmol/L (ref 135–145)
Total Bilirubin: 0.3 mg/dL (ref 0.3–1.2)
Total Protein: 7.2 g/dL (ref 6.5–8.1)

## 2019-12-16 LAB — PREPARE RBC (CROSSMATCH)

## 2019-12-16 MED ORDER — ACETAMINOPHEN 325 MG PO TABS
650.0000 mg | ORAL_TABLET | Freq: Once | ORAL | Status: AC
  Administered 2019-12-16: 650 mg via ORAL

## 2019-12-16 MED ORDER — SODIUM CHLORIDE 0.9 % IV SOLN
400.0000 mg/m2 | Freq: Once | INTRAVENOUS | Status: AC
  Administered 2019-12-16: 874 mg via INTRAVENOUS
  Filled 2019-12-16: qty 22.99

## 2019-12-16 MED ORDER — PROCHLORPERAZINE MALEATE 10 MG PO TABS
10.0000 mg | ORAL_TABLET | Freq: Once | ORAL | Status: AC
  Administered 2019-12-16: 10 mg via ORAL

## 2019-12-16 MED ORDER — HEPARIN SOD (PORK) LOCK FLUSH 100 UNIT/ML IV SOLN
500.0000 [IU] | Freq: Once | INTRAVENOUS | Status: AC | PRN
  Administered 2019-12-16: 500 [IU]
  Filled 2019-12-16: qty 5

## 2019-12-16 MED ORDER — PROCHLORPERAZINE MALEATE 10 MG PO TABS
ORAL_TABLET | ORAL | Status: AC
  Filled 2019-12-16: qty 1

## 2019-12-16 MED ORDER — SODIUM CHLORIDE 0.9% FLUSH
10.0000 mL | Freq: Once | INTRAVENOUS | Status: AC
  Administered 2019-12-16: 10 mL
  Filled 2019-12-16: qty 10

## 2019-12-16 MED ORDER — DIPHENHYDRAMINE HCL 25 MG PO CAPS
ORAL_CAPSULE | ORAL | Status: AC
  Filled 2019-12-16: qty 1

## 2019-12-16 MED ORDER — DIPHENHYDRAMINE HCL 25 MG PO CAPS
25.0000 mg | ORAL_CAPSULE | Freq: Once | ORAL | Status: AC
  Administered 2019-12-16: 25 mg via ORAL

## 2019-12-16 MED ORDER — SODIUM CHLORIDE 0.9 % IV SOLN
Freq: Once | INTRAVENOUS | Status: AC
  Filled 2019-12-16: qty 250

## 2019-12-16 MED ORDER — SODIUM CHLORIDE 0.9% FLUSH
10.0000 mL | INTRAVENOUS | Status: DC | PRN
  Administered 2019-12-16: 10 mL
  Filled 2019-12-16: qty 10

## 2019-12-16 MED ORDER — ACETAMINOPHEN 325 MG PO TABS
ORAL_TABLET | ORAL | Status: AC
  Filled 2019-12-16: qty 2

## 2019-12-16 NOTE — Assessment & Plan Note (Signed)
Her blood pressure is low I recommend discontinuation of hydrochlorothiazide I plan to discontinue her Cozaar in the future if she remain hypotensive

## 2019-12-16 NOTE — Patient Instructions (Addendum)
[facility] Discharge Instructions for Patients Receiving Chemotherapy  Today you received the following chemotherapy agents: Gemzar  To help prevent nausea and vomiting after your treatment, we encourage you to take your nausea medication as prescribed.    If you develop nausea and vomiting that is not controlled by your nausea medication, call the clinic.   BELOW ARE SYMPTOMS THAT SHOULD BE REPORTED IMMEDIATELY:  *FEVER GREATER THAN 100.5 F  *CHILLS WITH OR WITHOUT FEVER  NAUSEA AND VOMITING THAT IS NOT CONTROLLED WITH YOUR NAUSEA MEDICATION  *UNUSUAL SHORTNESS OF BREATH  *UNUSUAL BRUISING OR BLEEDING  TENDERNESS IN MOUTH AND THROAT WITH OR WITHOUT PRESENCE OF ULCERS  *URINARY PROBLEMS  *BOWEL PROBLEMS  UNUSUAL RASH Items with * indicate a potential emergency and should be followed up as soon as possible.  Feel free to call the clinic should you have any questions or concerns. The clinic phone number is [phone].  Please show the CHEMO ALERT CARD at check-in to the Emergency Department and triage nurse.     Blood Transfusion, Adult, Care After This sheet gives you information about how to care for yourself after your procedure. Your doctor may also give you more specific instructions. If you have problems or questions, contact your doctor. What can I expect after the procedure? After the procedure, it is common to have:  Bruising and soreness at the IV site.  A fever or chills on the day of the procedure. This may be your body's response to the new blood cells received.  A headache. Follow these instructions at home: Insertion site care      Follow instructions from your doctor about how to take care of your insertion site. This is where an IV tube was put into your vein. Make sure you: ? Wash your hands with soap and water before and after you change your bandage (dressing). If you cannot use soap and water, use hand sanitizer. ? Change  your bandage as told by your doctor.  Check your insertion site every day for signs of infection. Check for: ? Redness, swelling, or pain. ? Bleeding from the site. ? Warmth. ? Pus or a bad smell. General instructions  Take over-the-counter and prescription medicines only as told by your doctor.  Rest as told by your doctor.  Go back to your normal activities as told by your doctor.  Keep all follow-up visits as told by your doctor. This is important. Contact a doctor if:  You have itching or red, swollen areas of skin (hives).  You feel worried or nervous (anxious).  You feel weak after doing your normal activities.  You have redness, swelling, warmth, or pain around the insertion site.  You have blood coming from the insertion site, and the blood does not stop with pressure.  You have pus or a bad smell coming from the insertion site. Get help right away if:  You have signs of a serious reaction. This may be coming from an allergy or the body's defense system (immune system). Signs include: ? Trouble breathing or shortness of breath. ? Swelling of the face or feeling warm (flushed). ? Fever or chills. ? Head, chest, or back pain. ? Dark pee (urine) or blood in the pee. ? Widespread rash. ? Fast heartbeat. ? Feeling dizzy or light-headed. You may receive your blood transfusion in an outpatient setting. If so, you will be told whom to contact to report any reactions. These symptoms may be an emergency. Do not   wait to see if the symptoms will go away. Get medical help right away. Call your local emergency services (911 in the [place] Do not drive yourself to the hospital. Summary  Bruising and soreness at the IV site are common.  Check your insertion site every day for signs of infection.  Rest as told by your doctor. Go back to your normal activities as told by your doctor.  Get help right away if you have signs of a serious reaction. This information is not intended  to replace advice given to you by your health care provider. Make sure you discuss any questions you have with your health care provider. Document Revised: 12/16/2018 Document Reviewed: 12/16/2018 Elsevier Patient Education  2020 [employer]    

## 2019-12-16 NOTE — Assessment & Plan Note (Addendum)
She has severe pancytopenia due to treatment I recommend further dose adjustment We also discussed transfusion support We discussed some of the risks, benefits, and alternatives of blood transfusions. The patient is symptomatic from anemia and the hemoglobin level is critically low.  Some of the side-effects to be expected including risks of transfusion reactions, chills, infection, syndrome of volume overload and risk of hospitalization from various reasons and the patient is willing to proceed and went ahead to sign consent today. I have checked serum vitamin B12 and iron studies in February and they are adequate

## 2019-12-16 NOTE — Patient Instructions (Signed)

## 2019-12-16 NOTE — Assessment & Plan Note (Signed)
Unfortunately, despite aggressive dose adjustment, she remained pancytopenic We discussed the risk and benefits of proceeding with treatment versus holding treatment Ultimately, we agreed to proceed with treatment today, with further dose adjustment of gemcitabine along with transfusion support I recommend minimum 6 doses of chemo before repeating imaging study

## 2019-12-16 NOTE — Progress Notes (Signed)
[place] OFFICE PROGRESS NOTE  Patient Care Team: Lilian Coma., MD as PCP - General (Internal Medicine)  ASSESSMENT & PLAN:  Ovarian cancer on right [facility]) Unfortunately, despite aggressive dose adjustment, she remained pancytopenic We discussed the risk and benefits of proceeding with treatment versus holding treatment Ultimately, we agreed to proceed with treatment today, with further dose adjustment of gemcitabine along with transfusion support I recommend minimum 6 doses of chemo before repeating imaging study  Pancytopenia, acquired ([place]) She has severe pancytopenia due to treatment I recommend further dose adjustment We also discussed transfusion support We discussed some of the risks, benefits, and alternatives of blood transfusions. The patient is symptomatic from anemia and the hemoglobin level is critically low.  Some of the side-effects to be expected including risks of transfusion reactions, chills, infection, syndrome of volume overload and risk of hospitalization from various reasons and the patient is willing to proceed and went ahead to sign consent today. I have checked serum vitamin B12 and iron studies in February and they are adequate  Essential hypertension Her blood pressure is low I recommend discontinuation of hydrochlorothiazide I plan to discontinue her Cozaar in the future if she remain hypotensive   Orders Placed This Encounter  Procedures  . Informed Consent Details: Physician/Practitioner Attestation; Transcribe to consent form and obtain patient signature    Standing Status:   Future    Standing Expiration Date:   12/15/2020    Order Specific Question:   Physician/Practitioner attestation of informed consent for blood and or blood product transfusion    Answer:   I, the physician/practitioner, attest that I have discussed with the patient the benefits, risks, side effects, alternatives, likelihood of achieving goals and potential  problems during recovery for the procedure that I have provided informed consent.    Order Specific Question:   Product(s)    Answer:   All Product(s)  . Care order/instruction    Transfuse Parameters    Standing Status:   Future    Standing Expiration Date:   12/15/2020  . Type and screen    Standing Status:   Future    Number of Occurrences:   1    Standing Expiration Date:   12/15/2020  . Prepare RBC (crossmatch)    Standing Status:   Standing    Number of Occurrences:   1    Order Specific Question:   # of Units    Answer:   1 unit    Order Specific Question:   Transfusion Indications    Answer:   Symptomatic Anemia    Order Specific Question:   Special Requirements    Answer:   Irradiated    Order Specific Question:   Number of Units to Keep Ahead    Answer:   NO units ahead    Order Specific Question:   Instructions:    Answer:   Transfuse    Order Specific Question:   If emergent release call blood bank    Answer:   Not emergent release    All questions were answered. The patient knows to call the clinic with any problems, questions or concerns. The total time spent in the appointment was 40 minutes encounter with patients including review of chart and various tests results, discussions about plan of care and coordination of care plan   Heath Lark, MD 12/16/2019 1:31 PM  INTERVAL HISTORY: Please see below for problem oriented charting. She returns with her husband for further follow-up Her  sister is also available over the phone She feels well She has some tiredness Denies chest pain or shortness of breath The patient denies any recent signs or symptoms of bleeding such as spontaneous epistaxis, hematuria or hematochezia. 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    03/20/2017 Imaging   GYN ultrasound was performed this revealed a small fundus measuring 5 cm there are 2 small fibroids within the fundus with the largest measuring 1.3 cm Within the endometrium there is a small amount a fluid the endometrial wall was slightly thickened at 4.8 mm the ultrasound was consistent with a small endometrial polyp  The right adnexa measured 7.4 x 6.7 cm contained a both a cystic and solid mass with a small amount of shadowing This is consistent with a dermoid tumor the left ovary was 2.6 x 1.5 x 1.0 cm There is no other adnexal masses no free fluid within the abdomen no ascites     04/10/2017 Surgery   Operation: Operative Laparscopy with  Peritoneal biopsy 2 hysteroscopy D&C Surgeon: Lynder Parents MD  Specimens: Peritoneal biopsy and cellular washings  Complications: None FIndings Small amount of ascites was found cellular washings were sent There was peritoneal studding And omental caking Even in steep Trendelenburg the ovaries and uterus were unable to be visualized due to the small intestines in the lower pelvis     04/10/2017 Pathology Results   Biopsy from [facility] was nondiagnostic.  Peritoneum biopsy did not reveal malignancy.  Endometrial biopsy also did not reveal malignancy.   04/13/2017 Tumor Marker   Patient's tumor was tested for the following markers: CA-125 Results of the tumor marker test revealed 324.1   04/20/2017 Tumor Marker   Patient's tumor was tested for the following markers: CA-125 Results of the tumor marker test revealed 345.6   04/22/2017 Pathology Results   Peritoneum, biopsy - CARCINOMA. - SEE COMMENT.   04/22/2017 Procedure    CT-guided core biopsy performed of peritoneal tumor in the left lower quadrant.   04/24/2017 - 09/25/2017 Chemotherapy   She received carboplatin and Taxol x 3 cycles followed by interval surgical debulking on 12/31. Chemotherapy was subsequently resumed for 3 more cycles   06/05/2017 Tumor Marker   Patient's tumor was tested for the following markers: CA-125 Results of the tumor marker test revealed 27.5   06/17/2017 Imaging   1. Considerable improvement in the prior omental caking, which now presents mainly as an indistinct stranding with slight nodularity along the left upper quadrant omentum. 2. Stable appearance of right ovarian mass with fatty and calcific elements classic for a dermoid. 3. There is accentuated enhancement in the walls of the common hepatic duct and common bile duct. A low-grade cholangitis is not readily excluded. 4. Other imaging findings of potential clinical significance: Mild cardiomegaly. Aortic Atherosclerosis (ICD10-I70.0). Lumbar scoliosis, spondylosis, degenerative disc disease, and pars defects at L5. These cause impingement at L5-S1 and lesser impingement at L3-4 and L4-5. 5. 65% compression fracture at L1 slightly worsened than on  the prior exam, with associated vertebral sclerosis and posterior retropulsion.   07/06/2017 Surgery   Bilateral Salpingo-Oophorectomy W/Omentectomy, Total Abd Hysterectomy & Radical Dissection For Debulking Right - Ureterolysis, With Or Without Repositioning Of Ureter For Retroperitoneal Fibrosis   07/06/2017 Pathology Results   A: Omentum, omentectomy - Omentum with minimal residual carcinoma, consistent with high grade serous carcinoma (microscopic; stage ypT3a) - Extensive treatment effect with calcifications, fibrosis, histiocytes, and giant cell response - CRS score 3  B: Uterus with cervix and left ovary and fallopian tube, hysterectomy and left salpingo-oophorectomy - Cervix: Ectocervix and endocervix with no  dysplasia or malignancy identified - Endometrium: Inactive with no hyperplasia, atypia, or malignancy identified - Myometrium: Adenomyosis and benign leiomyoma, size 1.2 cm - Serosa: Focal calcification and histiocytes suggestive of treatment effect, with no viable carcinoma identified - Ovary, left: Physiologic changes and no carcinoma identified - Fallopian tube, left: 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 and no malignancy identified  C: Ovary and fallopian tube, right, salpingo-oophorectomy - Right fallopian tube with focal residual carcinoma, consistent with high grade serous carcinoma, and serous tubal intraepithelial carcinoma (STIC) - Associated calcifications and histiocytes suggestive of treatment response - Right ovary with calcifications and histiocytes suggestive of treatment effect, with no definite viable carcinoma identified - Mature cystic teratoma, 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 - See synoptic report and comment   08/14/2017 Tumor Marker   Patient's tumor was tested for the following markers: CA-125 Results of the tumor marker test revealed 14.6   09/04/2017 Tumor Marker   Patient's tumor was tested for the following markers: CA-125 Results of the tumor marker test revealed 12.5   10/03/2017 Genetic Testing   The Common Hereditary Cancer Panel offered by [facility] includes sequencing and/or deletion duplication testing of the following 47 genes: APC, ATM, AXIN2, BARD1, BMPR1A, BRCA1, BRCA2, BRIP1, CDH1, CDKN2A (p14ARF), CDKN2A (p16INK4a), CKD4, CHEK2, CTNNA1, DICER1, EPCAM (Deletion/duplication testing only), GREM1 (promoter region deletion/duplication testing only), KIT, MEN1, MLH1, MSH2, MSH3, MSH6, MUTYH, NBN, NF1, NHTL1, PALB2, PDGFRA, PMS2, POLD1, POLE, PTEN, RAD50, RAD51C, RAD51D, SDHB, SDHC, SDHD, SMAD4, SMARCA4. STK11, TP53, TSC1, TSC2, and VHL.  The following genes were evaluated for sequence changes only: SDHA and HOXB13 c.251G>A variant only.  Results: Negative, no pathogenic  variants identified.  The date of this test report is 10/03/2017.    10/28/2017 Imaging   1. Stable pulmonary nodules. No new or progressive findings. 2. No residual or recurrent omental disease is identified. No obvious peritoneal surface disease. 3. Increasing soft tissue density and ill-defined interstitial changes around the abdominal wall surgical incision. This may be progressive postoperative changes or granulation tissue but attention on future scans to exclude the possibility of tumor. 4. Status post resection of right adnexal mass. No pelvic mass or pelvic adenopathy. 5. Stable T12 compression fracture.   01/25/2018 Tumor Marker   Patient's tumor was tested for the following markers: CA-125 Results of the tumor marker test revealed 9.2   12/10/2018 Tumor Marker   Patient's tumor was tested for the following markers: CA-125 Results of the tumor marker test revealed 212   12/22/2018 Imaging   1. Interval development of multiple soft tissue lesions in the gastrocolic ligament, mesentery, and along the peritoneal surface. These soft tissue lesions measure up to 6.5 cm in size. Associated interval development of moderate volume ascites in the abdomen and pelvis. Imaging features are consistent with metastatic disease. 2. Soft tissue projects through the rectus sheath in the right paraumbilical region, likely herniated small bowel as  there is a large amount of decompressed small bowel does deep to this finding. However, metastatic deposit at this location is also possibility.  3. Trace pneumobilia in the left liver suggests prior sphincterotomy. 4.  Aortic Atherosclerois (ICD10-170.0)   12/30/2018 Procedure   Successful ultrasound-guided therapeutic paracentesis yielding 4.5 liters of peritoneal fluid.   12/30/2018 Procedure   Technically successful right IJ power-injectable port catheter placement. Ready for routine use.   12/31/2018 -  Chemotherapy   The patient had carboplatin and taxol  for chemotherapy treatment.     01/21/2019 Tumor Marker   Patient's tumor was tested for the following markers: CA-125 Results of the tumor marker test revealed 408   02/11/2019 Tumor Marker   Patient's tumor was tested for the following markers: CA-125 Results of the tumor marker test revealed 646.   03/02/2019 Imaging   1. Marked improvement in peritoneal carcinomatosis/ascites, without complete resolution. 2.  Aortic atherosclerosis (ICD10-170.0).     03/25/2019 Tumor Marker   Patient's tumor was tested for the following markers: CA-125 Results of the tumor marker test revealed 24.1.   04/15/2019 Tumor Marker   Patient's tumor was tested for the following markers: CA-125 Results of the tumor marker test revealed 15.3   05/18/2019 Imaging   1. There is progressive, nearly complete resolution of peritoneal and omental nodularity seen on prior examination with very faint residual peritoneal and omental haziness (series 2, image 50, 39).   2. Interval resolution of ascites.   3.  Status post hysterectomy and oophorectomy.   4.  Coronary artery disease.  Aortic Atherosclerosis (ICD10-I70.0).      Tumor Marker   Patient's tumor was tested for the following markers: CA-125 Results of the tumor marker test revealed 11.2.   06/10/2019 Tumor Marker   Patient's tumor was tested for the following markers: CA-125 Results of the tumor marker test revealed 9.1   07/29/2019 Tumor Marker   Patient's tumor was tested for the following markers: CA-125 Results of the tumor marker test revealed 8.8   08/18/2019 Imaging   1. No evidence of new or progressive metastatic disease in the abdomen or pelvis. Stable small nodular soft tissue focus at the right umbilicus. No new peritoneal implants. No ascites. 2. Chronic findings include: Aortic Atherosclerosis (ICD10-I70.0). Coronary atherosclerosis. Chronic moderate T12 vertebral compression fractures.   09/09/2019 Tumor Marker   Patient's tumor was  tested for the following markers: CA-125 Results of the tumor marker test revealed 11.4.   10/21/2019 Tumor Marker   Patient's tumor was tested for the following markers: CA-125 Results of the tumor marker test revealed 21.5   10/31/2019 Imaging   1. New hepatic and peritoneal metastatic disease. 2.  Aortic atherosclerosis (ICD10-I70.0).     11/11/2019 -  Chemotherapy   The patient had carboplatin and gemzar for chemotherapy treatment.     11/11/2019 Tumor Marker   Patient's tumor was tested for the following markers: CA-125 Results of the tumor marker test revealed 40.2    Genetic Testing   Patient has genetic testing done for PD-L1. Results revealed patient has the following:  PD-L1 staining in tumor cells (TC): <1% PD-L1 staining in tumor-associated immune cells (IC): 0% PD-L1 combined positive score (CPS): <1%   11/11/2019 -  Chemotherapy   The patient had carboplatin and gemzar for chemotherapy treatment.       REVIEW OF SYSTEMS:   Constitutional: Denies fevers, chills or abnormal weight loss Eyes: Denies blurriness of vision Ears, nose, mouth, throat, and face: Denies  mucositis or sore throat Respiratory: Denies cough, dyspnea or wheezes Cardiovascular: Denies palpitation, chest discomfort or lower extremity swelling Gastrointestinal:  Denies nausea, heartburn or change in bowel habits Skin: Denies abnormal skin rashes Lymphatics: Denies new lymphadenopathy or easy bruising Neurological:Denies numbness, tingling or new weaknesses Behavioral/Psych: Mood is stable, no new changes  All other systems were reviewed with the patient and are negative.  I have reviewed the past medical history, past surgical history, social history and family history with the patient and they are unchanged from previous note.  ALLERGIES:  is allergic to aspirin, nsaids, and tramadol.  MEDICATIONS:  Current Outpatient Medications  Medication Sig Dispense Refill  . acetaminophen (TYLENOL)  650 MG CR tablet Take 1,300 mg by mouth every 8 (eight) hours.     Marland Kitchen alendronate (FOSAMAX) 70 MG tablet Take 70 mg by mouth every Sunday.     Marland Kitchen atorvastatin (LIPITOR) 80 MG tablet Take 40 mg by mouth at bedtime.     . Cholecalciferol (VITAMIN D3) 2000 units TABS Take 2,000 Units by mouth daily.    . citalopram (CELEXA) 10 MG tablet Take 10 mg by mouth at bedtime    . ferrous sulfate 325 (65 FE) MG tablet Take 325 mg by mouth daily. 3 hours after meal and 30 min before a meal    . gabapentin (NEURONTIN) 300 MG capsule Take by mouth.    . Glucosamine Sulfate 500 MG TABS Take 500 mg by mouth 2 (two) times daily.     Marland Kitchen lidocaine-prilocaine (EMLA) cream Apply to affected area once 30 g 3  . loratadine (CLARITIN) 10 MG tablet Take 10 mg by mouth at bedtime.     Marland Kitchen losartan (COZAAR) 25 MG tablet Take 25 mg by mouth daily.     . meclizine (ANTIVERT) 25 MG tablet Take 25 mg by mouth 2 (two) times daily as needed for dizziness.     . metFORMIN (GLUCOPHAGE) 1000 MG tablet Take 1,000 mg by mouth 2 (two) times daily with a meal.     . omeprazole (PRILOSEC) 40 MG capsule Take 40 mg by mouth daily.     . ondansetron (ZOFRAN) 8 MG tablet Take 1 tablet (8 mg total) by mouth every 8 (eight) hours as needed for refractory nausea / vomiting. 30 tablet 1  . pioglitazone (ACTOS) 15 MG tablet Take 15 mg by mouth at bedtime.     . prochlorperazine (COMPAZINE) 10 MG tablet Take 1 tablet (10 mg total) by mouth every 6 (six) hours as needed (Nausea or vomiting). 30 tablet 1  . Pyridoxine HCl (VITAMIN B-6 PO) Take 50 mg by mouth 1 day or 1 dose.    . vitamin B-12 (CYANOCOBALAMIN) 1000 MCG tablet Take 1,000 mcg by mouth daily.      No current facility-administered medications for this visit.   Facility-Administered Medications Ordered in Other Visits  Medication Dose Route Frequency Provider Last Rate Last Admin  . heparin lock flush 100 unit/mL  500 Units Intracatheter Once PRN [name], Jamyla Ard, MD      . sodium chloride  flush (NS) 0.9 % injection 10 mL  10 mL Intracatheter Once [name], Lache Dagher, MD      . sodium chloride flush (NS) 0.9 % injection 10 mL  10 mL Intracatheter PRN [name], Geraldo Haris, MD        PHYSICAL EXAMINATION: ECOG PERFORMANCE STATUS: 2 - Symptomatic, <50% confined to bed  Vitals:   12/16/19 0912  BP: (!) 112/41  Pulse: 84  Resp: 18  Temp: 98.6 F (37 C)  SpO2: 98%   Filed Weights   12/16/19 0912  Weight: 212 lb 6.4 oz (96.3 kg)    GENERAL:alert, no distress and comfortable.  She looks pale NEURO: alert & oriented x 3 with fluent speech, no focal motor/sensory deficits  LABORATORY DATA:  I have reviewed the data as listed    Component Value Date/Time   NA 139 12/16/2019 0830   NA 139 06/05/2017 0746   K 4.0 12/16/2019 0830   K 4.1 06/05/2017 0746   CL 105 12/16/2019 0830   CO2 22 12/16/2019 0830   CO2 21 (L) 06/05/2017 0746   GLUCOSE 108 (H) 12/16/2019 0830   GLUCOSE 279 (H) 06/05/2017 0746   BUN 31 (H) 12/16/2019 0830   BUN 15.0 06/05/2017 0746   CREATININE 1.01 (H) 12/16/2019 0830   CREATININE 0.9 06/05/2017 0746   CALCIUM 9.3 12/16/2019 0830   CALCIUM 10.1 06/05/2017 0746   PROT 7.2 12/16/2019 0830   PROT 8.1 06/05/2017 0746   ALBUMIN 3.7 12/16/2019 0830   ALBUMIN 4.0 06/05/2017 0746   AST 40 12/16/2019 0830   AST 22 06/05/2017 0746   ALT 47 (H) 12/16/2019 0830   ALT 36 06/05/2017 0746   ALKPHOS 68 12/16/2019 0830   ALKPHOS 98 06/05/2017 0746   BILITOT 0.3 12/16/2019 0830   BILITOT 0.44 06/05/2017 0746   GFRNONAA 58 (L) 12/16/2019 0830   GFRAA >60 12/16/2019 0830    No results found for: SPEP, UPEP  Lab Results  Component Value Date   WBC 2.7 (L) 12/16/2019   NEUTROABS 1.3 (L) 12/16/2019   HGB 7.6 (L) 12/16/2019   HCT 23.8 (L) 12/16/2019   MCV 98.3 12/16/2019   PLT 103 (L) 12/16/2019      Chemistry      Component Value Date/Time   NA 139 12/16/2019 0830   NA 139 06/05/2017 0746   K 4.0 12/16/2019 0830   K 4.1 06/05/2017 0746   CL 105 12/16/2019  0830   CO2 22 12/16/2019 0830   CO2 21 (L) 06/05/2017 0746   BUN 31 (H) 12/16/2019 0830   BUN 15.0 06/05/2017 0746   CREATININE 1.01 (H) 12/16/2019 0830   CREATININE 0.9 06/05/2017 0746      Component Value Date/Time   CALCIUM 9.3 12/16/2019 0830   CALCIUM 10.1 06/05/2017 0746   ALKPHOS 68 12/16/2019 0830   ALKPHOS 98 06/05/2017 0746   AST 40 12/16/2019 0830   AST 22 06/05/2017 0746   ALT 47 (H) 12/16/2019 0830   ALT 36 06/05/2017 0746   BILITOT 0.3 12/16/2019 0830   BILITOT 0.44 06/05/2017 0746

## 2019-12-16 NOTE — Progress Notes (Signed)
Per treatment plan, ok to treat with current labs.

## 2019-12-17 LAB — CA 125: Cancer Antigen (CA) 125: 41.7 U/mL — ABNORMAL HIGH (ref 0.0–38.1)

## 2019-12-17 LAB — TYPE AND SCREEN
ABO/RH(D): O POS
Antibody Screen: NEGATIVE
Unit division: 0

## 2019-12-17 LAB — BPAM RBC
Blood Product Expiration Date: 202107092359
ISSUE DATE / TIME: 202106111130
Unit Type and Rh: 5100

## 2019-12-19 ENCOUNTER — Telehealth: Payer: Self-pay | Admitting: Hematology and Oncology

## 2019-12-19 ENCOUNTER — Telehealth: Payer: Self-pay

## 2019-12-19 NOTE — Telephone Encounter (Signed)
Called and given below message. She verbalized understanding. 

## 2019-12-19 NOTE — Telephone Encounter (Signed)
-----   Message from Heath Lark, MD sent at 12/19/2019  7:18 AM EDT ----- Regarding: pls let her know CA-125 is stable (only up a point)

## 2019-12-19 NOTE — Telephone Encounter (Signed)
Scheduled per 6/11 sch message. Pt aware of appts on 6/25 and 7/9.

## 2019-12-30 ENCOUNTER — Inpatient Hospital Stay: Payer: Medicare Other

## 2019-12-30 ENCOUNTER — Other Ambulatory Visit: Payer: Self-pay

## 2019-12-30 ENCOUNTER — Inpatient Hospital Stay (HOSPITAL_BASED_OUTPATIENT_CLINIC_OR_DEPARTMENT_OTHER): Payer: Medicare Other | Admitting: Hematology and Oncology

## 2019-12-30 ENCOUNTER — Encounter: Payer: Self-pay | Admitting: Hematology and Oncology

## 2019-12-30 DIAGNOSIS — C561 Malignant neoplasm of right ovary: Secondary | ICD-10-CM

## 2019-12-30 DIAGNOSIS — D539 Nutritional anemia, unspecified: Secondary | ICD-10-CM

## 2019-12-30 DIAGNOSIS — Z5111 Encounter for antineoplastic chemotherapy: Secondary | ICD-10-CM | POA: Diagnosis not present

## 2019-12-30 DIAGNOSIS — I10 Essential (primary) hypertension: Secondary | ICD-10-CM

## 2019-12-30 DIAGNOSIS — Z7189 Other specified counseling: Secondary | ICD-10-CM

## 2019-12-30 LAB — CMP (CANCER CENTER ONLY)
ALT: 32 U/L (ref 0–44)
AST: 34 U/L (ref 15–41)
Albumin: 3.7 g/dL (ref 3.5–5.0)
Alkaline Phosphatase: 71 U/L (ref 38–126)
Anion gap: 12 (ref 5–15)
BUN: 26 mg/dL — ABNORMAL HIGH (ref 8–23)
CO2: 20 mmol/L — ABNORMAL LOW (ref 22–32)
Calcium: 9.2 mg/dL (ref 8.9–10.3)
Chloride: 108 mmol/L (ref 98–111)
Creatinine: 0.86 mg/dL (ref 0.44–1.00)
GFR, Est AFR Am: >60 mL/min (ref >60)
GFR, Estimated: >60 mL/min (ref >60)
Glucose, Bld: 121 mg/dL — ABNORMAL HIGH (ref 70–99)
Potassium: 3.6 mmol/L (ref 3.5–5.1)
Sodium: 140 mmol/L (ref 135–145)
Total Bilirubin: 0.3 mg/dL (ref 0.3–1.2)
Total Protein: 7.2 g/dL (ref 6.5–8.1)

## 2019-12-30 LAB — CBC WITH DIFFERENTIAL (CANCER CENTER ONLY)
Abs Immature Granulocytes: 0.02 10*3/uL (ref 0.00–0.07)
Basophils Absolute: 0 10*3/uL (ref 0.0–0.1)
Basophils Relative: 1 %
Eosinophils Absolute: 0 10*3/uL (ref 0.0–0.5)
Eosinophils Relative: 1 %
HCT: 27.5 % — ABNORMAL LOW (ref 36.0–46.0)
Hemoglobin: 8.8 g/dL — ABNORMAL LOW (ref 12.0–15.0)
Immature Granulocytes: 1 %
Lymphocytes Relative: 24 %
Lymphs Abs: 0.8 10*3/uL (ref 0.7–4.0)
MCH: 32.7 pg (ref 26.0–34.0)
MCHC: 32 g/dL (ref 30.0–36.0)
MCV: 102.2 fL — ABNORMAL HIGH (ref 80.0–100.0)
Monocytes Absolute: 0.5 10*3/uL (ref 0.1–1.0)
Monocytes Relative: 14 %
Neutro Abs: 2.1 10*3/uL (ref 1.7–7.7)
Neutrophils Relative %: 59 %
Platelet Count: 187 10*3/uL (ref 150–400)
RBC: 2.69 MIL/uL — ABNORMAL LOW (ref 3.87–5.11)
RDW: 19.6 % — ABNORMAL HIGH (ref 11.5–15.5)
WBC Count: 3.5 10*3/uL — ABNORMAL LOW (ref 4.0–10.5)
nRBC: 0 % (ref 0.0–0.2)

## 2019-12-30 MED ORDER — SODIUM CHLORIDE 0.9 % IV SOLN
Freq: Once | INTRAVENOUS | Status: AC
  Filled 2019-12-30: qty 250

## 2019-12-30 MED ORDER — DIPHENHYDRAMINE HCL 50 MG/ML IJ SOLN
INTRAMUSCULAR | Status: AC
  Filled 2019-12-30: qty 1

## 2019-12-30 MED ORDER — FAMOTIDINE IN NACL 20-0.9 MG/50ML-% IV SOLN
INTRAVENOUS | Status: AC
  Filled 2019-12-30: qty 50

## 2019-12-30 MED ORDER — HEPARIN SOD (PORK) LOCK FLUSH 100 UNIT/ML IV SOLN
500.0000 [IU] | Freq: Once | INTRAVENOUS | Status: AC | PRN
  Administered 2019-12-30: 500 [IU]
  Filled 2019-12-30: qty 5

## 2019-12-30 MED ORDER — FAMOTIDINE IN NACL 20-0.9 MG/50ML-% IV SOLN
20.0000 mg | Freq: Once | INTRAVENOUS | Status: AC
  Administered 2019-12-30: 20 mg via INTRAVENOUS

## 2019-12-30 MED ORDER — SODIUM CHLORIDE 0.9% FLUSH
10.0000 mL | INTRAVENOUS | Status: DC | PRN
  Administered 2019-12-30: 10 mL
  Filled 2019-12-30: qty 10

## 2019-12-30 MED ORDER — SODIUM CHLORIDE 0.9% FLUSH
10.0000 mL | Freq: Once | INTRAVENOUS | Status: AC
  Administered 2019-12-30: 10 mL
  Filled 2019-12-30: qty 10

## 2019-12-30 MED ORDER — PALONOSETRON HCL INJECTION 0.25 MG/5ML
INTRAVENOUS | Status: AC
  Filled 2019-12-30: qty 5

## 2019-12-30 MED ORDER — SODIUM CHLORIDE 0.9 % IV SOLN
402.0000 mg/m2 | Freq: Once | INTRAVENOUS | Status: AC
  Administered 2019-12-30: 874 mg via INTRAVENOUS
  Filled 2019-12-30: qty 22.99

## 2019-12-30 MED ORDER — DIPHENHYDRAMINE HCL 50 MG/ML IJ SOLN
50.0000 mg | Freq: Once | INTRAMUSCULAR | Status: AC
  Administered 2019-12-30: 50 mg via INTRAVENOUS

## 2019-12-30 MED ORDER — SODIUM CHLORIDE 0.9 % IV SOLN
450.0000 mg | Freq: Once | INTRAVENOUS | Status: AC
  Administered 2019-12-30: 450 mg via INTRAVENOUS
  Filled 2019-12-30: qty 45

## 2019-12-30 MED ORDER — SODIUM CHLORIDE 0.9 % IV SOLN
150.0000 mg | Freq: Once | INTRAVENOUS | Status: AC
  Administered 2019-12-30: 150 mg via INTRAVENOUS
  Filled 2019-12-30: qty 150

## 2019-12-30 MED ORDER — PALONOSETRON HCL INJECTION 0.25 MG/5ML
0.2500 mg | Freq: Once | INTRAVENOUS | Status: AC
  Administered 2019-12-30: 0.25 mg via INTRAVENOUS

## 2019-12-30 MED ORDER — SODIUM CHLORIDE 0.9 % IV SOLN
10.0000 mg | Freq: Once | INTRAVENOUS | Status: AC
  Administered 2019-12-30: 10 mg via INTRAVENOUS
  Filled 2019-12-30: qty 10

## 2019-12-30 NOTE — Progress Notes (Signed)
[place] OFFICE PROGRESS NOTE  Patient Care Team: Cheryl Melton., MD as PCP - General (Internal Medicine)  ASSESSMENT & PLAN:  Ovarian cancer on right [facility]) Unfortunately, despite aggressive dose adjustment, she remained pancytopenic We will proceed with similar dose adjustment She does not need transfusion support today I recommend minimum 6 doses of chemo before repeating imaging study  Deficiency anemia After aggressive dose adjustment, her blood count is stable She does not need transfusion support We will proceed with treatment as scheduled  Essential hypertension Her blood pressure is trending down/normal She will continue to check her blood pressure on a regular basis We might have to discontinue Cozaar if she lose more weight and blood pressure continues to go down   No orders of the defined types were placed in this encounter.   All questions were answered. The patient knows to call the clinic with any problems, questions or concerns. The total time spent in the appointment was 20 minutes encounter with patients including review of chart and various tests results, discussions about plan of care and coordination of care plan   Cheryl Lark, MD 12/30/2019 9:18 AM  INTERVAL HISTORY: Please see below for problem oriented charting. She returns with her sister and husband for further follow-up She tolerated treatment well She has more energy since blood transfusion Her blood pressure at home is borderline low She continues on restricted carbohydrate intake and managed to successfully lose some weight The patient denies any recent signs or symptoms of bleeding such as spontaneous epistaxis, hematuria or hematochezia. 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    03/20/2017 Imaging   GYN ultrasound was performed this revealed a small fundus measuring 5 cm there are 2 small fibroids within the fundus with the largest measuring 1.3 cm Within the endometrium there is a small amount a fluid the endometrial wall was slightly thickened at 4.8 mm the ultrasound was consistent with a small endometrial polyp  The right adnexa measured 7.4 x 6.7 cm contained a both a cystic and solid mass with a small amount of shadowing This is consistent with a dermoid tumor the left ovary was 2.6 x 1.5 x 1.0 cm There is no other adnexal masses no free fluid within the abdomen no ascites     04/10/2017 Surgery   Operation: Operative Laparscopy with  Peritoneal biopsy 2 hysteroscopy D&C Surgeon: Lynder Parents MD  Specimens: Peritoneal biopsy and cellular washings  Complications: None FIndings Small amount of ascites was found cellular washings were sent There was peritoneal studding And omental caking Even in steep Trendelenburg the ovaries and uterus were unable to be visualized due to the small intestines in the lower pelvis     04/10/2017 Pathology Results   Biopsy from [facility] was nondiagnostic.  Peritoneum biopsy did not reveal malignancy.  Endometrial biopsy also did not reveal malignancy.   04/13/2017 Tumor Marker   Patient's tumor was tested for the following markers: CA-125 Results of the tumor marker test revealed 324.1  04/20/2017 Tumor Marker   Patient's tumor was tested for the following markers: CA-125 Results of the tumor marker test revealed 345.6   04/22/2017 Pathology Results   Peritoneum, biopsy - CARCINOMA. - SEE COMMENT.   04/22/2017 Procedure    CT-guided core biopsy performed of peritoneal tumor in the left lower quadrant.   04/24/2017 - 09/25/2017 Chemotherapy   She received carboplatin and Taxol x 3 cycles followed by interval surgical debulking on 12/31. Chemotherapy was subsequently resumed for 3 more cycles   06/05/2017 Tumor Marker   Patient's tumor was tested for the following markers: CA-125 Results of the tumor marker test revealed 27.5   06/17/2017 Imaging   1. Considerable improvement in the prior omental caking, which now presents mainly as an indistinct stranding with slight nodularity along the left upper quadrant omentum. 2. Stable appearance of right ovarian mass with fatty and calcific elements classic for a dermoid. 3. There is accentuated enhancement in the walls of the common hepatic duct and common bile duct. A low-grade cholangitis is not readily excluded. 4. Other imaging findings of potential clinical significance: Mild cardiomegaly. Aortic Atherosclerosis (ICD10-I70.0). Lumbar scoliosis, spondylosis, degenerative disc disease, and pars defects at L5. These cause impingement at L5-S1 and lesser impingement at L3-4 and L4-5. 5. 65% compression fracture at L1 slightly worsened than on the prior exam, with associated vertebral sclerosis and posterior retropulsion.   07/06/2017 Surgery   Bilateral Salpingo-Oophorectomy W/Omentectomy, Total Abd Hysterectomy & Radical Dissection For Debulking Right - Ureterolysis, With Or Without Repositioning Of Ureter For Retroperitoneal Fibrosis   07/06/2017 Pathology Results   A: Omentum, omentectomy - Omentum with minimal residual carcinoma, consistent with high grade serous carcinoma (microscopic; stage ypT3a) - Extensive treatment effect with calcifications, fibrosis, histiocytes, and giant cell response - CRS score 3  B: Uterus with cervix and left ovary and fallopian tube, hysterectomy and left salpingo-oophorectomy - Cervix: Ectocervix and endocervix with no  dysplasia or malignancy identified - Endometrium: Inactive with no hyperplasia, atypia, or malignancy identified - Myometrium: Adenomyosis and benign leiomyoma, size 1.2 cm - Serosa: Focal calcification and histiocytes suggestive of treatment effect, with no viable carcinoma identified - Ovary, left: Physiologic changes and no carcinoma identified - Fallopian tube, left: 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 and no malignancy identified  C: Ovary and fallopian tube, right, salpingo-oophorectomy - Right fallopian tube with focal residual carcinoma, consistent with high grade serous carcinoma, and serous tubal intraepithelial carcinoma (STIC) - Associated calcifications and histiocytes suggestive of treatment response - Right ovary with calcifications and histiocytes suggestive of treatment effect, with no definite viable carcinoma identified - Mature cystic teratoma, 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 - See synoptic report and comment   08/14/2017 Tumor Marker   Patient's tumor was tested for the following markers: CA-125 Results of the tumor marker test revealed 14.6   09/04/2017 Tumor Marker   Patient's tumor was tested for the following markers: CA-125 Results of the tumor marker test revealed 12.5   10/03/2017 Genetic Testing   The Common Hereditary Cancer Panel offered by [facility] includes sequencing and/or deletion duplication testing of the following 47 genes: APC, ATM, AXIN2, BARD1, BMPR1A, BRCA1, BRCA2, BRIP1, CDH1, CDKN2A (p14ARF), CDKN2A (p16INK4a), CKD4, CHEK2, CTNNA1, DICER1, EPCAM (Deletion/duplication testing only), GREM1 (promoter region deletion/duplication testing only), KIT, MEN1, MLH1, MSH2, MSH3, MSH6, MUTYH, NBN, NF1, NHTL1, PALB2, PDGFRA, PMS2, POLD1, POLE, PTEN, RAD50, RAD51C, RAD51D, SDHB, SDHC, SDHD, SMAD4, SMARCA4. STK11, TP53, TSC1, TSC2, and VHL.  The following genes were evaluated for sequence changes only:  SDHA and HOXB13 c.251G>A variant only.  Results: Negative, no pathogenic  variants identified.  The date of this test report is 10/03/2017.    10/28/2017 Imaging   1. Stable pulmonary nodules. No new or progressive findings. 2. No residual or recurrent omental disease is identified. No obvious peritoneal surface disease. 3. Increasing soft tissue density and ill-defined interstitial changes around the abdominal wall surgical incision. This may be progressive postoperative changes or granulation tissue but attention on future scans to exclude the possibility of tumor. 4. Status post resection of right adnexal mass. No pelvic mass or pelvic adenopathy. 5. Stable T12 compression fracture.   01/25/2018 Tumor Marker   Patient's tumor was tested for the following markers: CA-125 Results of the tumor marker test revealed 9.2   12/10/2018 Tumor Marker   Patient's tumor was tested for the following markers: CA-125 Results of the tumor marker test revealed 212   12/22/2018 Imaging   1. Interval development of multiple soft tissue lesions in the gastrocolic ligament, mesentery, and along the peritoneal surface. These soft tissue lesions measure up to 6.5 cm in size. Associated interval development of moderate volume ascites in the abdomen and pelvis. Imaging features are consistent with metastatic disease. 2. Soft tissue projects through the rectus sheath in the right paraumbilical region, likely herniated small bowel as there is a large amount of decompressed small bowel does deep to this finding. However, metastatic deposit at this location is also possibility.  3. Trace pneumobilia in the left liver suggests prior sphincterotomy. 4.  Aortic Atherosclerois (ICD10-170.0)   12/30/2018 Procedure   Successful ultrasound-guided therapeutic paracentesis yielding 4.5 liters of peritoneal fluid.   12/30/2018 Procedure   Technically successful right IJ power-injectable port catheter placement. Ready for routine use.   12/31/2018 -  Chemotherapy   The patient had carboplatin and taxol  for chemotherapy treatment.     01/21/2019 Tumor Marker   Patient's tumor was tested for the following markers: CA-125 Results of the tumor marker test revealed 408   02/11/2019 Tumor Marker   Patient's tumor was tested for the following markers: CA-125 Results of the tumor marker test revealed 646.   03/02/2019 Imaging   1. Marked improvement in peritoneal carcinomatosis/ascites, without complete resolution. 2.  Aortic atherosclerosis (ICD10-170.0).     03/25/2019 Tumor Marker   Patient's tumor was tested for the following markers: CA-125 Results of the tumor marker test revealed 24.1.   04/15/2019 Tumor Marker   Patient's tumor was tested for the following markers: CA-125 Results of the tumor marker test revealed 15.3   05/18/2019 Imaging   1. There is progressive, nearly complete resolution of peritoneal and omental nodularity seen on prior examination with very faint residual peritoneal and omental haziness (series 2, image 50, 39).   2. Interval resolution of ascites.   3.  Status post hysterectomy and oophorectomy.   4.  Coronary artery disease.  Aortic Atherosclerosis (ICD10-I70.0).      Tumor Marker   Patient's tumor was tested for the following markers: CA-125 Results of the tumor marker test revealed 11.2.   06/10/2019 Tumor Marker   Patient's tumor was tested for the following markers: CA-125 Results of the tumor marker test revealed 9.1   07/29/2019 Tumor Marker   Patient's tumor was tested for the following markers: CA-125 Results of the tumor marker test revealed 8.8   08/18/2019 Imaging   1. No evidence of new or progressive metastatic disease in the abdomen or pelvis. Stable small nodular soft tissue focus  at the right umbilicus. No new peritoneal implants. No ascites. 2. Chronic findings include: Aortic Atherosclerosis (ICD10-I70.0). Coronary atherosclerosis. Chronic moderate T12 vertebral compression fractures.   09/09/2019 Tumor Marker   Patient's tumor was  tested for the following markers: CA-125 Results of the tumor marker test revealed 11.4.   10/21/2019 Tumor Marker   Patient's tumor was tested for the following markers: CA-125 Results of the tumor marker test revealed 21.5   10/31/2019 Imaging   1. New hepatic and peritoneal metastatic disease. 2.  Aortic atherosclerosis (ICD10-I70.0).     11/11/2019 -  Chemotherapy   The patient had carboplatin and gemzar for chemotherapy treatment.     11/11/2019 Tumor Marker   Patient's tumor was tested for the following markers: CA-125 Results of the tumor marker test revealed 40.2    Genetic Testing   Patient has genetic testing done for PD-L1. Results revealed patient has the following:  PD-L1 staining in tumor cells (TC): <1% PD-L1 staining in tumor-associated immune cells (IC): 0% PD-L1 combined positive score (CPS): <1%   11/11/2019 -  Chemotherapy   The patient had carboplatin and gemzar for chemotherapy treatment.     12/16/2019 Tumor Marker   Patient's tumor was tested for the following markers: CA-125 Results of the tumor marker test revealed 41.7     REVIEW OF SYSTEMS:   Constitutional: Denies fevers, chills or abnormal weight loss Eyes: Denies blurriness of vision Ears, nose, mouth, throat, and face: Denies mucositis or sore throat Respiratory: Denies cough, dyspnea or wheezes Cardiovascular: Denies palpitation, chest discomfort or lower extremity swelling Gastrointestinal:  Denies nausea, heartburn or change in bowel habits Skin: Denies abnormal skin rashes Lymphatics: Denies new lymphadenopathy or easy bruising Neurological:Denies numbness, tingling or new weaknesses Behavioral/Psych: Mood is stable, no new changes  All other systems were reviewed with the patient and are negative.  I have reviewed the past medical history, past surgical history, social history and family history with the patient and they are unchanged from previous note.  ALLERGIES:  is allergic to  aspirin, nsaids, and tramadol.  MEDICATIONS:  Current Outpatient Medications  Medication Sig Dispense Refill  . acetaminophen (TYLENOL) 650 MG CR tablet Take 1,300 mg by mouth every 8 (eight) hours.     Marland Kitchen alendronate (FOSAMAX) 70 MG tablet Take 70 mg by mouth every Sunday.     Marland Kitchen atorvastatin (LIPITOR) 80 MG tablet Take 40 mg by mouth at bedtime.     . Cholecalciferol (VITAMIN D3) 2000 units TABS Take 2,000 Units by mouth daily.    . citalopram (CELEXA) 10 MG tablet Take 10 mg by mouth at bedtime    . ferrous sulfate 325 (65 FE) MG tablet Take 325 mg by mouth daily. 3 hours after meal and 30 min before a meal    . gabapentin (NEURONTIN) 300 MG capsule Take by mouth.    . Glucosamine Sulfate 500 MG TABS Take 500 mg by mouth 2 (two) times daily.     Marland Kitchen lidocaine-prilocaine (EMLA) cream Apply to affected area once 30 g 3  . loratadine (CLARITIN) 10 MG tablet Take 10 mg by mouth at bedtime.     Marland Kitchen losartan (COZAAR) 25 MG tablet Take 25 mg by mouth daily.     . meclizine (ANTIVERT) 25 MG tablet Take 25 mg by mouth 2 (two) times daily as needed for dizziness.     . metFORMIN (GLUCOPHAGE) 1000 MG tablet Take 1,000 mg by mouth 2 (two) times daily with a meal.     .  omeprazole (PRILOSEC) 40 MG capsule Take 40 mg by mouth daily.     . ondansetron (ZOFRAN) 8 MG tablet Take 1 tablet (8 mg total) by mouth every 8 (eight) hours as needed for refractory nausea / vomiting. 30 tablet 1  . pioglitazone (ACTOS) 15 MG tablet Take 15 mg by mouth at bedtime.     . prochlorperazine (COMPAZINE) 10 MG tablet Take 1 tablet (10 mg total) by mouth every 6 (six) hours as needed (Nausea or vomiting). 30 tablet 1  . Pyridoxine HCl (VITAMIN B-6 PO) Take 50 mg by mouth 1 day or 1 dose.    . vitamin B-12 (CYANOCOBALAMIN) 1000 MCG tablet Take 1,000 mcg by mouth daily.      No current facility-administered medications for this visit.   Facility-Administered Medications Ordered in Other Visits  Medication Dose Route Frequency  Provider Last Rate Last Admin  . sodium chloride flush (NS) 0.9 % injection 10 mL  10 mL Intracatheter Once [name], Waleska Buttery, MD        PHYSICAL EXAMINATION: ECOG PERFORMANCE STATUS: 1 - Symptomatic but completely ambulatory  Vitals:   12/30/19 0850  BP: (!) 125/58  Pulse: 84  Resp: 18  Temp: 98.2 F (36.8 C)  SpO2: 99%   Filed Weights   12/30/19 0850  Weight: 208 lb 12.8 oz (94.7 kg)    GENERAL:alert, no distress and comfortable SKIN: skin color, texture, turgor are normal, no rashes or significant lesions EYES: normal, Conjunctiva are pink and non-injected, sclera clear OROPHARYNX:no exudate, no erythema and lips, buccal mucosa, and tongue normal  NECK: supple, thyroid normal size, non-tender, without nodularity LYMPH:  no palpable lymphadenopathy in the cervical, axillary or inguinal LUNGS: clear to auscultation and percussion with normal breathing effort HEART: regular rate & rhythm and no murmurs and no lower extremity edema ABDOMEN:abdomen soft, non-tender and normal bowel sounds Musculoskeletal:no cyanosis of digits and no clubbing  NEURO: alert & oriented x 3 with fluent speech, no focal motor/sensory deficits  LABORATORY DATA:  I have reviewed the data as listed    Component Value Date/Time   NA 140 12/30/2019 0835   NA 139 06/05/2017 0746   K 3.6 12/30/2019 0835   K 4.1 06/05/2017 0746   CL 108 12/30/2019 0835   CO2 20 (L) 12/30/2019 0835   CO2 21 (L) 06/05/2017 0746   GLUCOSE 121 (H) 12/30/2019 0835   GLUCOSE 279 (H) 06/05/2017 0746   BUN 26 (H) 12/30/2019 0835   BUN 15.0 06/05/2017 0746   CREATININE 0.86 12/30/2019 0835   CREATININE 0.9 06/05/2017 0746   CALCIUM 9.2 12/30/2019 0835   CALCIUM 10.1 06/05/2017 0746   PROT 7.2 12/30/2019 0835   PROT 8.1 06/05/2017 0746   ALBUMIN 3.7 12/30/2019 0835   ALBUMIN 4.0 06/05/2017 0746   AST 34 12/30/2019 0835   AST 22 06/05/2017 0746   ALT 32 12/30/2019 0835   ALT 36 06/05/2017 0746   ALKPHOS 71 12/30/2019 0835    ALKPHOS 98 06/05/2017 0746   BILITOT 0.3 12/30/2019 0835   BILITOT 0.44 06/05/2017 0746   GFRNONAA >60 12/30/2019 0835   GFRAA >60 12/30/2019 0835    No results found for: SPEP, UPEP  Lab Results  Component Value Date   WBC 3.5 (L) 12/30/2019   NEUTROABS 2.1 12/30/2019   HGB 8.8 (L) 12/30/2019   HCT 27.5 (L) 12/30/2019   MCV 102.2 (H) 12/30/2019   PLT 187 12/30/2019      Chemistry      Component Value  Date/Time   NA 140 12/30/2019 0835   NA 139 06/05/2017 0746   K 3.6 12/30/2019 0835   K 4.1 06/05/2017 0746   CL 108 12/30/2019 0835   CO2 20 (L) 12/30/2019 0835   CO2 21 (L) 06/05/2017 0746   BUN 26 (H) 12/30/2019 0835   BUN 15.0 06/05/2017 0746   CREATININE 0.86 12/30/2019 0835   CREATININE 0.9 06/05/2017 0746      Component Value Date/Time   CALCIUM 9.2 12/30/2019 0835   CALCIUM 10.1 06/05/2017 0746   ALKPHOS 71 12/30/2019 0835   ALKPHOS 98 06/05/2017 0746   AST 34 12/30/2019 0835   AST 22 06/05/2017 0746   ALT 32 12/30/2019 0835   ALT 36 06/05/2017 0746   BILITOT 0.3 12/30/2019 0835   BILITOT 0.44 06/05/2017 0746

## 2019-12-30 NOTE — Assessment & Plan Note (Signed)
Unfortunately, despite aggressive dose adjustment, she remained pancytopenic We will proceed with similar dose adjustment She does not need transfusion support today I recommend minimum 6 doses of chemo before repeating imaging study

## 2019-12-30 NOTE — Patient Instructions (Signed)
[facility] Discharge Instructions for Patients Receiving Chemotherapy  Today you received the following chemotherapy agents Gemzar; Carboplatin  To help prevent nausea and vomiting after your treatment, we encourage you to take your nausea medication as directed   If you develop nausea and vomiting that is not controlled by your nausea medication, call the clinic.   BELOW ARE SYMPTOMS THAT SHOULD BE REPORTED IMMEDIATELY:  *FEVER GREATER THAN 100.5 F  *CHILLS WITH OR WITHOUT FEVER  NAUSEA AND VOMITING THAT IS NOT CONTROLLED WITH YOUR NAUSEA MEDICATION  *UNUSUAL SHORTNESS OF BREATH  *UNUSUAL BRUISING OR BLEEDING  TENDERNESS IN MOUTH AND THROAT WITH OR WITHOUT PRESENCE OF ULCERS  *URINARY PROBLEMS  *BOWEL PROBLEMS  UNUSUAL RASH Items with * indicate a potential emergency and should be followed up as soon as possible.  Feel free to call the clinic should you have any questions or concerns. The clinic phone number is [phone].  Please show the CHEMO ALERT CARD at check-in to the Emergency Department and triage nurse.  Gemcitabine injection What is this medicine? GEMCITABINE (jem SYE ta been) is a chemotherapy drug. This medicine is used to treat many types of cancer like breast cancer, lung cancer, pancreatic cancer, and ovarian cancer. This medicine may be used for other purposes; ask your health care provider or pharmacist if you have questions. COMMON BRAND NAME(S): Gemzar, Infugem What should I tell my health care provider before I take this medicine? They need to know if you have any of these conditions:  blood disorders  infection  kidney disease  liver disease  lung or breathing disease, like asthma  recent or ongoing radiation therapy  an unusual or allergic reaction to gemcitabine, other chemotherapy, other medicines, foods, dyes, or preservatives  pregnant or trying to get pregnant  breast-feeding How should I use this  medicine? This drug is given as an infusion into a vein. It is administered in a hospital or clinic by a specially trained health care professional. Talk to your pediatrician regarding the use of this medicine in children. Special care may be needed. Overdosage: If you think you have taken too much of this medicine contact a poison control center or emergency room at once. NOTE: This medicine is only for you. Do not share this medicine with others. What if I miss a dose? It is important not to miss your dose. Call your doctor or health care professional if you are unable to keep an appointment. What may interact with this medicine?  medicines to increase blood counts like filgrastim, pegfilgrastim, sargramostim  some other chemotherapy drugs like cisplatin  vaccines Talk to your doctor or health care professional before taking any of these medicines:  acetaminophen  aspirin  ibuprofen  ketoprofen  naproxen This list may not describe all possible interactions. Give your health care provider a list of all the medicines, herbs, non-prescription drugs, or dietary supplements you use. Also tell them if you smoke, drink alcohol, or use illegal drugs. Some items may interact with your medicine. What should I watch for while using this medicine? Visit your doctor for checks on your progress. This drug may make you feel generally unwell. This is not uncommon, as chemotherapy can affect healthy cells as well as cancer cells. Report any side effects. Continue your course of treatment even though you feel ill unless your doctor tells you to stop. In some cases, you may be given additional medicines to help with side effects. Follow all directions for their use.   Call your doctor or health care professional for advice if you get a fever, chills or sore throat, or other symptoms of a cold or flu. Do not treat yourself. This drug decreases your body's ability to fight infections. Try to avoid being  around people who are sick. This medicine may increase your risk to bruise or bleed. Call your doctor or health care professional if you notice any unusual bleeding. Be careful brushing and flossing your teeth or using a toothpick because you may get an infection or bleed more easily. If you have any dental work done, tell your dentist you are receiving this medicine. Avoid taking products that contain aspirin, acetaminophen, ibuprofen, naproxen, or ketoprofen unless instructed by your doctor. These medicines may hide a fever. Do not become pregnant while taking this medicine or for 6 months after stopping it. Women should inform their doctor if they wish to become pregnant or think they might be pregnant. Men should not father a child while taking this medicine and for 3 months after stopping it. There is a potential for serious side effects to an unborn child. Talk to your health care professional or pharmacist for more information. Do not breast-feed an infant while taking this medicine or for at least 1 week after stopping it. Men should inform their doctors if they wish to father a child. This medicine may lower sperm counts. Talk with your doctor or health care professional if you are concerned about your fertility. What side effects may I notice from receiving this medicine? Side effects that you should report to your doctor or health care professional as soon as possible:  allergic reactions like skin rash, itching or hives, swelling of the face, lips, or tongue  breathing problems  pain, redness, or irritation at site where injected  signs and symptoms of a dangerous change in heartbeat or heart rhythm like chest pain; dizziness; fast or irregular heartbeat; palpitations; feeling faint or lightheaded, falls; breathing problems  signs of decreased platelets or bleeding - bruising, pinpoint red spots on the skin, black, tarry stools, blood in the urine  signs of decreased red blood cells -  unusually weak or tired, feeling faint or lightheaded, falls  signs of infection - fever or chills, cough, sore throat, pain or difficulty passing urine  signs and symptoms of kidney injury like trouble passing urine or change in the amount of urine  signs and symptoms of liver injury like dark yellow or brown urine; general ill feeling or flu-like symptoms; light-colored stools; loss of appetite; nausea; right upper belly pain; unusually weak or tired; yellowing of the eyes or skin  swelling of ankles, feet, hands Side effects that usually do not require medical attention (report to your doctor or health care professional if they continue or are bothersome):  constipation  diarrhea  hair loss  loss of appetite  nausea  rash  vomiting This list may not describe all possible side effects. Call your doctor for medical advice about side effects. You may report side effects to FDA at 1-800-FDA-1088. Where should I keep my medicine? This drug is given in a hospital or clinic and will not be stored at home. NOTE: This sheet is a summary. It may not cover all possible information. If you have questions about this medicine, talk to your doctor, pharmacist, or health care provider.  2020 Elsevier/Gold Standard (2017-09-16 18:06:11)  Carboplatin injection What is this medicine? CARBOPLATIN (KAR boe pla tin) is a chemotherapy drug. It targets fast dividing   cells, like cancer cells, and causes these cells to die. This medicine is used to treat ovarian cancer and many other cancers. This medicine may be used for other purposes; ask your health care provider or pharmacist if you have questions. COMMON BRAND NAME(S): Paraplatin What should I tell my health care provider before I take this medicine? They need to know if you have any of these conditions:  blood disorders  hearing problems  kidney disease  recent or ongoing radiation therapy  an unusual or allergic reaction to  carboplatin, cisplatin, other chemotherapy, other medicines, foods, dyes, or preservatives  pregnant or trying to get pregnant  breast-feeding How should I use this medicine? This drug is usually given as an infusion into a vein. It is administered in a hospital or clinic by a specially trained health care professional. Talk to your pediatrician regarding the use of this medicine in children. Special care may be needed. Overdosage: If you think you have taken too much of this medicine contact a poison control center or emergency room at once. NOTE: This medicine is only for you. Do not share this medicine with others. What if I miss a dose? It is important not to miss a dose. Call your doctor or health care professional if you are unable to keep an appointment. What may interact with this medicine?  medicines for seizures  medicines to increase blood counts like filgrastim, pegfilgrastim, sargramostim  some antibiotics like amikacin, gentamicin, neomycin, streptomycin, tobramycin  vaccines Talk to your doctor or health care professional before taking any of these medicines:  acetaminophen  aspirin  ibuprofen  ketoprofen  naproxen This list may not describe all possible interactions. Give your health care provider a list of all the medicines, herbs, non-prescription drugs, or dietary supplements you use. Also tell them if you smoke, drink alcohol, or use illegal drugs. Some items may interact with your medicine. What should I watch for while using this medicine? Your condition will be monitored carefully while you are receiving this medicine. You will need important blood work done while you are taking this medicine. This drug may make you feel generally unwell. This is not uncommon, as chemotherapy can affect healthy cells as well as cancer cells. Report any side effects. Continue your course of treatment even though you feel ill unless your doctor tells you to stop. In some  cases, you may be given additional medicines to help with side effects. Follow all directions for their use. Call your doctor or health care professional for advice if you get a fever, chills or sore throat, or other symptoms of a cold or flu. Do not treat yourself. This drug decreases your body's ability to fight infections. Try to avoid being around people who are sick. This medicine may increase your risk to bruise or bleed. Call your doctor or health care professional if you notice any unusual bleeding. Be careful brushing and flossing your teeth or using a toothpick because you may get an infection or bleed more easily. If you have any dental work done, tell your dentist you are receiving this medicine. Avoid taking products that contain aspirin, acetaminophen, ibuprofen, naproxen, or ketoprofen unless instructed by your doctor. These medicines may hide a fever. Do not become pregnant while taking this medicine. Women should inform their doctor if they wish to become pregnant or think they might be pregnant. There is a potential for serious side effects to an unborn child. Talk to your health care professional or pharmacist for   more information. Do not breast-feed an infant while taking this medicine. What side effects may I notice from receiving this medicine? Side effects that you should report to your doctor or health care professional as soon as possible:  allergic reactions like skin rash, itching or hives, swelling of the face, lips, or tongue  signs of infection - fever or chills, cough, sore throat, pain or difficulty passing urine  signs of decreased platelets or bleeding - bruising, pinpoint red spots on the skin, black, tarry stools, nosebleeds  signs of decreased red blood cells - unusually weak or tired, fainting spells, lightheadedness  breathing problems  changes in hearing  changes in vision  chest pain  high blood pressure  low blood counts - This drug may decrease  the number of white blood cells, red blood cells and platelets. You may be at increased risk for infections and bleeding.  nausea and vomiting  pain, swelling, redness or irritation at the injection site  pain, tingling, numbness in the hands or feet  problems with balance, talking, walking  trouble passing urine or change in the amount of urine Side effects that usually do not require medical attention (report to your doctor or health care professional if they continue or are bothersome):  hair loss  loss of appetite  metallic taste in the mouth or changes in taste This list may not describe all possible side effects. Call your doctor for medical advice about side effects. You may report side effects to FDA at 1-800-FDA-1088. Where should I keep my medicine? This drug is given in a hospital or clinic and will not be stored at home. NOTE: This sheet is a summary. It may not cover all possible information. If you have questions about this medicine, talk to your doctor, pharmacist, or health care provider.  2020 Elsevier/Gold Standard (2007-09-28 14:38:05)   

## 2019-12-30 NOTE — Assessment & Plan Note (Signed)
Her blood pressure is trending down/normal She will continue to check her blood pressure on a regular basis We might have to discontinue Cozaar if she lose more weight and blood pressure continues to go down

## 2019-12-30 NOTE — Assessment & Plan Note (Signed)
After aggressive dose adjustment, her blood count is stable She does not need transfusion support We will proceed with treatment as scheduled

## 2020-01-06 NOTE — Progress Notes (Signed)
Pharmacist Chemotherapy Monitoring - Follow Up Assessment    I verify that I have reviewed each item in the below checklist:  . Regimen for the patient is scheduled for the appropriate day and plan matches scheduled date. Marland Kitchen Appropriate non-routine labs are ordered dependent on drug ordered. . If applicable, additional medications reviewed and ordered per protocol based on lifetime cumulative doses and/or treatment regimen.   Plan for follow-up and/or issues identified: No . I-vent associated with next due treatment: No . MD and/or nursing notified: No  Philomena Course 01/06/2020 11:01 AM

## 2020-01-12 ENCOUNTER — Telehealth: Payer: Self-pay

## 2020-01-12 ENCOUNTER — Other Ambulatory Visit: Payer: Self-pay

## 2020-01-12 DIAGNOSIS — D6481 Anemia due to antineoplastic chemotherapy: Secondary | ICD-10-CM

## 2020-01-12 DIAGNOSIS — E559 Vitamin D deficiency, unspecified: Secondary | ICD-10-CM

## 2020-01-12 DIAGNOSIS — E119 Type 2 diabetes mellitus without complications: Secondary | ICD-10-CM

## 2020-01-12 DIAGNOSIS — C561 Malignant neoplasm of right ovary: Secondary | ICD-10-CM

## 2020-01-12 NOTE — Telephone Encounter (Signed)
Called office back and spoke with [employer]. They do not need another B12 level. Faxed b12 results to [phone].

## 2020-01-12 NOTE — Telephone Encounter (Signed)
Amber, nurse with Dr. Alanson Aly, endocrinologist in [facility] called and requested labs for tomorrow if possible. Requesting PTH, vitamin B12 and vitamin D. She ask that results be faxed to [phone].

## 2020-01-12 NOTE — Telephone Encounter (Signed)
Pls put in the orders She had B12 level checked in Feb, does she need that again?

## 2020-01-13 ENCOUNTER — Inpatient Hospital Stay: Payer: Medicare Other

## 2020-01-13 ENCOUNTER — Inpatient Hospital Stay (HOSPITAL_BASED_OUTPATIENT_CLINIC_OR_DEPARTMENT_OTHER): Payer: Medicare Other | Admitting: Hematology and Oncology

## 2020-01-13 ENCOUNTER — Inpatient Hospital Stay: Payer: Medicare Other | Attending: Gynecology

## 2020-01-13 ENCOUNTER — Other Ambulatory Visit: Payer: Self-pay

## 2020-01-13 ENCOUNTER — Encounter: Payer: Self-pay | Admitting: Hematology and Oncology

## 2020-01-13 VITALS — BP 132/71 | HR 81 | Temp 98.7°F | Resp 18

## 2020-01-13 VITALS — BP 127/58 | HR 92 | Temp 98.7°F | Resp 17 | Ht 67.0 in | Wt 207.8 lb

## 2020-01-13 DIAGNOSIS — D6481 Anemia due to antineoplastic chemotherapy: Secondary | ICD-10-CM

## 2020-01-13 DIAGNOSIS — E559 Vitamin D deficiency, unspecified: Secondary | ICD-10-CM

## 2020-01-13 DIAGNOSIS — C561 Malignant neoplasm of right ovary: Secondary | ICD-10-CM

## 2020-01-13 DIAGNOSIS — E119 Type 2 diabetes mellitus without complications: Secondary | ICD-10-CM

## 2020-01-13 DIAGNOSIS — D61818 Other pancytopenia: Secondary | ICD-10-CM | POA: Insufficient documentation

## 2020-01-13 DIAGNOSIS — T451X5A Adverse effect of antineoplastic and immunosuppressive drugs, initial encounter: Secondary | ICD-10-CM

## 2020-01-13 DIAGNOSIS — Z7189 Other specified counseling: Secondary | ICD-10-CM

## 2020-01-13 DIAGNOSIS — Z5111 Encounter for antineoplastic chemotherapy: Secondary | ICD-10-CM | POA: Insufficient documentation

## 2020-01-13 LAB — CMP (CANCER CENTER ONLY)
ALT: 35 U/L (ref 0–44)
AST: 40 U/L (ref 15–41)
Albumin: 3.8 g/dL (ref 3.5–5.0)
Alkaline Phosphatase: 84 U/L (ref 38–126)
Anion gap: 11 (ref 5–15)
BUN: 27 mg/dL — ABNORMAL HIGH (ref 8–23)
CO2: 22 mmol/L (ref 22–32)
Calcium: 9.5 mg/dL (ref 8.9–10.3)
Chloride: 106 mmol/L (ref 98–111)
Creatinine: 0.88 mg/dL (ref 0.44–1.00)
GFR, Est AFR Am: >60 mL/min (ref >60)
GFR, Estimated: >60 mL/min (ref >60)
Glucose, Bld: 116 mg/dL — ABNORMAL HIGH (ref 70–99)
Potassium: 4 mmol/L (ref 3.5–5.1)
Sodium: 139 mmol/L (ref 135–145)
Total Bilirubin: 0.4 mg/dL (ref 0.3–1.2)
Total Protein: 7.8 g/dL (ref 6.5–8.1)

## 2020-01-13 LAB — CBC WITH DIFFERENTIAL (CANCER CENTER ONLY)
Abs Immature Granulocytes: 0.03 10*3/uL (ref 0.00–0.07)
Basophils Absolute: 0 10*3/uL (ref 0.0–0.1)
Basophils Relative: 1 %
Eosinophils Absolute: 0 10*3/uL (ref 0.0–0.5)
Eosinophils Relative: 1 %
HCT: 25.1 % — ABNORMAL LOW (ref 36.0–46.0)
Hemoglobin: 8 g/dL — ABNORMAL LOW (ref 12.0–15.0)
Immature Granulocytes: 1 %
Lymphocytes Relative: 22 %
Lymphs Abs: 1 10*3/uL (ref 0.7–4.0)
MCH: 32.9 pg (ref 26.0–34.0)
MCHC: 31.9 g/dL (ref 30.0–36.0)
MCV: 103.3 fL — ABNORMAL HIGH (ref 80.0–100.0)
Monocytes Absolute: 0.7 10*3/uL (ref 0.1–1.0)
Monocytes Relative: 15 %
Neutro Abs: 2.9 10*3/uL (ref 1.7–7.7)
Neutrophils Relative %: 60 %
Platelet Count: 131 10*3/uL — ABNORMAL LOW (ref 150–400)
RBC: 2.43 MIL/uL — ABNORMAL LOW (ref 3.87–5.11)
RDW: 20.7 % — ABNORMAL HIGH (ref 11.5–15.5)
WBC Count: 4.7 10*3/uL (ref 4.0–10.5)
nRBC: 0 % (ref 0.0–0.2)

## 2020-01-13 LAB — PREPARE RBC (CROSSMATCH)

## 2020-01-13 LAB — VITAMIN D 25 HYDROXY (VIT D DEFICIENCY, FRACTURES): Vit D, 25-Hydroxy: 47.32 ng/mL (ref 30–100)

## 2020-01-13 MED ORDER — ACETAMINOPHEN 325 MG PO TABS
650.0000 mg | ORAL_TABLET | Freq: Once | ORAL | Status: AC
  Administered 2020-01-13: 650 mg via ORAL

## 2020-01-13 MED ORDER — SODIUM CHLORIDE 0.9 % IV SOLN
Freq: Once | INTRAVENOUS | Status: AC
  Filled 2020-01-13: qty 250

## 2020-01-13 MED ORDER — SODIUM CHLORIDE 0.9% FLUSH
10.0000 mL | INTRAVENOUS | Status: DC | PRN
  Filled 2020-01-13: qty 10

## 2020-01-13 MED ORDER — SODIUM CHLORIDE 0.9% FLUSH
10.0000 mL | Freq: Once | INTRAVENOUS | Status: AC
  Administered 2020-01-13: 10 mL
  Filled 2020-01-13: qty 10

## 2020-01-13 MED ORDER — HEPARIN SOD (PORK) LOCK FLUSH 100 UNIT/ML IV SOLN
500.0000 [IU] | Freq: Once | INTRAVENOUS | Status: DC | PRN
  Filled 2020-01-13: qty 5

## 2020-01-13 MED ORDER — SODIUM CHLORIDE 0.9 % IV SOLN
874.0000 mg | Freq: Once | INTRAVENOUS | Status: AC
  Administered 2020-01-13: 874 mg via INTRAVENOUS
  Filled 2020-01-13: qty 22.99

## 2020-01-13 MED ORDER — DIPHENHYDRAMINE HCL 25 MG PO CAPS
25.0000 mg | ORAL_CAPSULE | Freq: Once | ORAL | Status: AC
  Administered 2020-01-13: 25 mg via ORAL

## 2020-01-13 MED ORDER — ACETAMINOPHEN 325 MG PO TABS
ORAL_TABLET | ORAL | Status: AC
  Filled 2020-01-13: qty 2

## 2020-01-13 MED ORDER — PROCHLORPERAZINE MALEATE 10 MG PO TABS
ORAL_TABLET | ORAL | Status: AC
  Filled 2020-01-13: qty 1

## 2020-01-13 MED ORDER — SODIUM CHLORIDE 0.9% IV SOLUTION
250.0000 mL | Freq: Once | INTRAVENOUS | Status: AC
  Administered 2020-01-13: 250 mL via INTRAVENOUS
  Filled 2020-01-13: qty 250

## 2020-01-13 MED ORDER — DIPHENHYDRAMINE HCL 25 MG PO CAPS
ORAL_CAPSULE | ORAL | Status: AC
  Filled 2020-01-13: qty 1

## 2020-01-13 MED ORDER — PROCHLORPERAZINE MALEATE 10 MG PO TABS
10.0000 mg | ORAL_TABLET | Freq: Once | ORAL | Status: AC
  Administered 2020-01-13: 10 mg via ORAL

## 2020-01-13 NOTE — Progress Notes (Signed)
[place] OFFICE PROGRESS NOTE  Patient Care Team: Lilian Coma., MD as PCP - General (Internal Medicine)  ASSESSMENT & PLAN:  Ovarian cancer on right [facility]) Unfortunately, despite aggressive dose adjustment, she remained pancytopenic We will proceed with similar dose adjustment In addition to chemotherapy, she will also written receive 1 unit of blood She will return in 2 weeks for chemotherapy as scheduled I plan to repeat imaging study before I see her back next month  Pancytopenia, acquired [facility]) She has severe pancytopenia due to treatment despite dose adjustment We also discussed transfusion support We discussed some of the risks, benefits, and alternatives of blood transfusions. The patient is symptomatic from anemia and the hemoglobin level is critically low.  Some of the side-effects to be expected including risks of transfusion reactions, chills, infection, syndrome of volume overload and risk of hospitalization from various reasons and the patient is willing to proceed and went ahead to sign consent today. I have checked serum vitamin B12 and iron studies in February and they are adequate   Orders Placed This Encounter  Procedures   CT ABDOMEN PELVIS W CONTRAST    Standing Status:   Future    Standing Expiration Date:   01/12/2021    Order Specific Question:   If indicated for the ordered procedure, I authorize the administration of contrast media per Radiology protocol    Answer:   Yes    Order Specific Question:   Preferred imaging location?    Answer:   [facility]    Order Specific Question:   Radiology Contrast Protocol - do NOT remove file path    Answer:   [email or web address]   Informed Consent Details: Physician/Practitioner Attestation; Transcribe to consent form and obtain patient signature    Standing Status:   Future    Standing Expiration Date:   01/12/2021    Order Specific Question:   Physician/Practitioner  attestation of informed consent for blood and or blood product transfusion    Answer:   I, the physician/practitioner, attest that I have discussed with the patient the benefits, risks, side effects, alternatives, likelihood of achieving goals and potential problems during recovery for the procedure that I have provided informed consent.    Order Specific Question:   Product(s)    Answer:   All Product(s)   Care order/instruction    Transfuse Parameters    Standing Status:   Future    Standing Expiration Date:   01/12/2021   Type and screen    Standing Status:   Future    Number of Occurrences:   1    Standing Expiration Date:   01/12/2021   Prepare RBC (crossmatch)    Standing Status:   Standing    Number of Occurrences:   1    Order Specific Question:   # of Units    Answer:   1 unit    Order Specific Question:   Transfusion Indications    Answer:   Symptomatic Anemia    Order Specific Question:   Number of Units to Keep Ahead    Answer:   NO units ahead    Order Specific Question:   Instructions:    Answer:   Transfuse    Order Specific Question:   If emergent release call blood bank    Answer:   Not emergent release   Sample to Blood Bank    Standing Status:   Standing    Number of Occurrences:   33  Standing Expiration Date:   01/12/2021    All questions were answered. The patient knows to call the clinic with any problems, questions or concerns. The total time spent in the appointment was 30 minutes encounter with patients including review of chart and various tests results, discussions about plan of care and coordination of care plan   Heath Lark, MD 01/13/2020 8:38 AM  INTERVAL HISTORY: Please see below for problem oriented charting. She returns with her husband to be seen prior to treatment and follow-up She tolerated recent chemotherapy well Denies nausea No abdominal pain no changes in bowel habits Her blood pressure monitoring at home is satisfactory She  continues with dietary modification and have lost a little bit of weight She complains of fatigue The patient denies any recent signs or symptoms of bleeding such as spontaneous epistaxis, hematuria or hematochezia.   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    03/20/2017 Imaging   GYN ultrasound was performed this revealed a small fundus measuring 5 cm there are 2 small fibroids within the fundus with the largest measuring 1.3 cm Within the endometrium there is a small amount a fluid the endometrial wall was slightly thickened at 4.8 mm the ultrasound was consistent with a small endometrial polyp  The right adnexa measured 7.4 x 6.7 cm contained a both a cystic and solid mass with a small amount of shadowing This is consistent with a dermoid tumor the left ovary was 2.6 x 1.5 x 1.0 cm There is no other adnexal masses no free fluid within the abdomen no ascites     04/10/2017 Surgery   Operation: Operative Laparscopy with  Peritoneal biopsy 2 hysteroscopy D&C Surgeon: Lynder Parents MD  Specimens: Peritoneal biopsy and cellular washings  Complications: None FIndings Small amount of ascites was found cellular washings were sent There was peritoneal studding And omental caking Even in steep Trendelenburg the ovaries and uterus were unable to be visualized due to the small intestines in the lower pelvis     04/10/2017 Pathology Results   Biopsy from [facility] was nondiagnostic.  Peritoneum biopsy did not reveal malignancy.  Endometrial biopsy also did not  reveal malignancy.   04/13/2017 Tumor Marker   Patient's tumor was tested for the following markers: CA-125 Results of the tumor marker test revealed 324.1   04/20/2017 Tumor Marker   Patient's tumor was tested for the following markers: CA-125 Results of the tumor marker test revealed 345.6   04/22/2017 Pathology Results   Peritoneum, biopsy - CARCINOMA. - SEE COMMENT.   04/22/2017 Procedure   CT-guided core biopsy performed of peritoneal tumor in the left lower quadrant.   04/24/2017 - 09/25/2017 Chemotherapy   She received carboplatin and Taxol x 3 cycles followed by interval surgical debulking on 12/31. Chemotherapy was subsequently resumed for 3 more cycles   06/05/2017 Tumor Marker   Patient's tumor was tested for the following markers: CA-125 Results of the tumor marker test revealed 27.5   06/17/2017 Imaging   1. Considerable improvement in the prior omental caking, which now presents mainly as  an indistinct stranding with slight nodularity along the left upper quadrant omentum. 2. Stable appearance of right ovarian mass with fatty and calcific elements classic for a dermoid. 3. There is accentuated enhancement in the walls of the common hepatic duct and common bile duct. A low-grade cholangitis is not readily excluded. 4. Other imaging findings of potential clinical significance: Mild cardiomegaly. Aortic Atherosclerosis (ICD10-I70.0). Lumbar scoliosis, spondylosis, degenerative disc disease, and pars defects at L5. These cause impingement at L5-S1 and lesser impingement at L3-4 and L4-5. 5. 65% compression fracture at L1 slightly worsened than on the prior exam, with associated vertebral sclerosis and posterior retropulsion.   07/06/2017 Surgery   Bilateral Salpingo-Oophorectomy W/Omentectomy, Total Abd Hysterectomy & Radical Dissection For Debulking Right - Ureterolysis, With Or Without Repositioning Of Ureter For Retroperitoneal Fibrosis   07/06/2017 Pathology Results    A: Omentum, omentectomy - Omentum with minimal residual carcinoma, consistent with high grade serous carcinoma (microscopic; stage ypT3a) - Extensive treatment effect with calcifications, fibrosis, histiocytes, and giant cell response - CRS score 3  B: Uterus with cervix and left ovary and fallopian tube, hysterectomy and left salpingo-oophorectomy - Cervix: Ectocervix and endocervix with no dysplasia or malignancy identified - Endometrium: Inactive with no hyperplasia, atypia, or malignancy identified - Myometrium: Adenomyosis and benign leiomyoma, size 1.2 cm - Serosa: Focal calcification and histiocytes suggestive of treatment effect, with no viable carcinoma identified - Ovary, left: Physiologic changes and no carcinoma identified - Fallopian tube, left: 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 and no malignancy identified  C: Ovary and fallopian tube, right, salpingo-oophorectomy - Right fallopian tube with focal residual carcinoma, consistent with high grade serous carcinoma, and serous tubal intraepithelial carcinoma (STIC) - Associated calcifications and histiocytes suggestive of treatment response - Right ovary with calcifications and histiocytes suggestive of treatment effect, with no definite viable carcinoma identified - Mature cystic teratoma, 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 - See synoptic report and comment   08/14/2017 Tumor Marker   Patient's tumor was tested for the following markers: CA-125 Results of the tumor marker test revealed 14.6   09/04/2017 Tumor Marker   Patient's tumor was tested for the following markers: CA-125 Results of the tumor marker test revealed 12.5   10/03/2017 Genetic Testing   The Common Hereditary Cancer Panel offered by [facility] includes sequencing and/or deletion duplication testing of the following 47 genes: APC, ATM, AXIN2, BARD1, BMPR1A, BRCA1, BRCA2, BRIP1, CDH1, CDKN2A (p14ARF), CDKN2A (p16INK4a), CKD4, CHEK2, CTNNA1, DICER1, EPCAM (Deletion/duplication  testing only), GREM1 (promoter region deletion/duplication testing only), KIT, MEN1, MLH1, MSH2, MSH3, MSH6, MUTYH, NBN, NF1, NHTL1, PALB2, PDGFRA, PMS2, POLD1, POLE, PTEN, RAD50, RAD51C, RAD51D, SDHB, SDHC, SDHD, SMAD4, SMARCA4. STK11, TP53, TSC1, TSC2, and VHL.  The following genes were evaluated for sequence changes only: SDHA and HOXB13 c.251G>A variant only.  Results: Negative, no pathogenic variants identified.  The date of this test report is 10/03/2017.    10/28/2017 Imaging   1. Stable pulmonary nodules. No new or progressive findings. 2. No residual or recurrent omental disease is identified. No obvious peritoneal surface disease. 3. Increasing soft tissue density and ill-defined interstitial changes around the abdominal wall surgical incision. This may be progressive postoperative changes or granulation tissue but attention on future scans to exclude the possibility of tumor. 4. Status post resection of right adnexal mass. No pelvic mass or pelvic adenopathy. 5. Stable T12 compression fracture.   01/25/2018 Tumor Marker   Patient's tumor was tested for the following markers: CA-125 Results of the tumor marker test  revealed 9.2   12/10/2018 Tumor Marker   Patient's tumor was tested for the following markers: CA-125 Results of the tumor marker test revealed 212   12/22/2018 Imaging   1. Interval development of multiple soft tissue lesions in the gastrocolic ligament, mesentery, and along the peritoneal surface. These soft tissue lesions measure up to 6.5 cm in size. Associated interval development of moderate volume ascites in the abdomen and pelvis. Imaging features are consistent with metastatic disease. 2. Soft tissue projects through the rectus sheath in the right paraumbilical region, likely herniated small bowel as there is a large amount of decompressed small bowel does deep to this finding. However, metastatic deposit at this location is also possibility.  3. Trace pneumobilia in  the left liver suggests prior sphincterotomy. 4.  Aortic Atherosclerois (ICD10-170.0)   12/30/2018 Procedure   Successful ultrasound-guided therapeutic paracentesis yielding 4.5 liters of peritoneal fluid.   12/30/2018 Procedure   Technically successful right IJ power-injectable port catheter placement. Ready for routine use.   12/31/2018 -  Chemotherapy   The patient had carboplatin and taxol for chemotherapy treatment.     01/21/2019 Tumor Marker   Patient's tumor was tested for the following markers: CA-125 Results of the tumor marker test revealed 408   02/11/2019 Tumor Marker   Patient's tumor was tested for the following markers: CA-125 Results of the tumor marker test revealed 646.   03/02/2019 Imaging   1. Marked improvement in peritoneal carcinomatosis/ascites, without complete resolution. 2.  Aortic atherosclerosis (ICD10-170.0).     03/25/2019 Tumor Marker   Patient's tumor was tested for the following markers: CA-125 Results of the tumor marker test revealed 24.1.   04/15/2019 Tumor Marker   Patient's tumor was tested for the following markers: CA-125 Results of the tumor marker test revealed 15.3   05/18/2019 Imaging   1. There is progressive, nearly complete resolution of peritoneal and omental nodularity seen on prior examination with very faint residual peritoneal and omental haziness (series 2, image 50, 39).   2. Interval resolution of ascites.   3.  Status post hysterectomy and oophorectomy.   4.  Coronary artery disease.  Aortic Atherosclerosis (ICD10-I70.0).      Tumor Marker   Patient's tumor was tested for the following markers: CA-125 Results of the tumor marker test revealed 11.2.   06/10/2019 Tumor Marker   Patient's tumor was tested for the following markers: CA-125 Results of the tumor marker test revealed 9.1   07/29/2019 Tumor Marker   Patient's tumor was tested for the following markers: CA-125 Results of the tumor marker test revealed 8.8    08/18/2019 Imaging   1. No evidence of new or progressive metastatic disease in the abdomen or pelvis. Stable small nodular soft tissue focus at the right umbilicus. No new peritoneal implants. No ascites. 2. Chronic findings include: Aortic Atherosclerosis (ICD10-I70.0). Coronary atherosclerosis. Chronic moderate T12 vertebral compression fractures.   09/09/2019 Tumor Marker   Patient's tumor was tested for the following markers: CA-125 Results of the tumor marker test revealed 11.4.   10/21/2019 Tumor Marker   Patient's tumor was tested for the following markers: CA-125 Results of the tumor marker test revealed 21.5   10/31/2019 Imaging   1. New hepatic and peritoneal metastatic disease. 2.  Aortic atherosclerosis (ICD10-I70.0).     11/11/2019 -  Chemotherapy   The patient had carboplatin and gemzar for chemotherapy treatment.     11/11/2019 Tumor Marker   Patient's tumor was tested for the following markers: CA-125  Results of the tumor marker test revealed 40.2    Genetic Testing   Patient has genetic testing done for PD-L1. Results revealed patient has the following:  PD-L1 staining in tumor cells (TC): <1% PD-L1 staining in tumor-associated immune cells (IC): 0% PD-L1 combined positive score (CPS): <1%   11/11/2019 -  Chemotherapy   The patient had carboplatin and gemzar for chemotherapy treatment.     12/16/2019 Tumor Marker   Patient's tumor was tested for the following markers: CA-125 Results of the tumor marker test revealed 41.7     REVIEW OF SYSTEMS:   Constitutional: Denies fevers, chills or abnormal weight loss Eyes: Denies blurriness of vision Ears, nose, mouth, throat, and face: Denies mucositis or sore throat Respiratory: Denies cough, dyspnea or wheezes Cardiovascular: Denies palpitation, chest discomfort or lower extremity swelling Gastrointestinal:  Denies nausea, heartburn or change in bowel habits Skin: Denies abnormal skin rashes Lymphatics: Denies new  lymphadenopathy or easy bruising Neurological:Denies numbness, tingling or new weaknesses Behavioral/Psych: Mood is stable, no new changes  All other systems were reviewed with the patient and are negative.  I have reviewed the past medical history, past surgical history, social history and family history with the patient and they are unchanged from previous note.  ALLERGIES:  is allergic to aspirin, nsaids, and tramadol.  MEDICATIONS:  Current Outpatient Medications  Medication Sig Dispense Refill   acetaminophen (TYLENOL) 650 MG CR tablet Take 1,300 mg by mouth every 8 (eight) hours.      alendronate (FOSAMAX) 70 MG tablet Take 70 mg by mouth every Sunday.      atorvastatin (LIPITOR) 80 MG tablet Take 40 mg by mouth at bedtime.      Cholecalciferol (VITAMIN D3) 2000 units TABS Take 2,000 Units by mouth daily.     citalopram (CELEXA) 10 MG tablet Take 10 mg by mouth at bedtime     ferrous sulfate 325 (65 FE) MG tablet Take 325 mg by mouth daily. 3 hours after meal and 30 min before a meal     gabapentin (NEURONTIN) 300 MG capsule Take by mouth.     Glucosamine Sulfate 500 MG TABS Take 500 mg by mouth 2 (two) times daily.      lidocaine-prilocaine (EMLA) cream Apply to affected area once 30 g 3   loratadine (CLARITIN) 10 MG tablet Take 10 mg by mouth at bedtime.      losartan (COZAAR) 25 MG tablet Take 25 mg by mouth daily.      meclizine (ANTIVERT) 25 MG tablet Take 25 mg by mouth 2 (two) times daily as needed for dizziness.      metFORMIN (GLUCOPHAGE) 1000 MG tablet Take 1,000 mg by mouth 2 (two) times daily with a meal.      omeprazole (PRILOSEC) 40 MG capsule Take 40 mg by mouth daily.      ondansetron (ZOFRAN) 8 MG tablet Take 1 tablet (8 mg total) by mouth every 8 (eight) hours as needed for refractory nausea / vomiting. 30 tablet 1   pioglitazone (ACTOS) 15 MG tablet Take 15 mg by mouth at bedtime.      prochlorperazine (COMPAZINE) 10 MG tablet Take 1 tablet (10 mg  total) by mouth every 6 (six) hours as needed (Nausea or vomiting). 30 tablet 1   Pyridoxine HCl (VITAMIN B-6 PO) Take 50 mg by mouth 1 day or 1 dose.     vitamin B-12 (CYANOCOBALAMIN) 1000 MCG tablet Take 1,000 mcg by mouth daily.      No  current facility-administered medications for this visit.   Facility-Administered Medications Ordered in Other Visits  Medication Dose Route Frequency Provider Last Rate Last Admin   sodium chloride flush (NS) 0.9 % injection 10 mL  10 mL Intracatheter Once [name], Jasminemarie Sherrard, MD        PHYSICAL EXAMINATION: ECOG PERFORMANCE STATUS: 2 - Symptomatic, <50% confined to bed  Vitals:   01/13/20 0801  BP: (!) 127/58  Pulse: 92  Resp: 17  Temp: 98.7 F (37.1 C)  SpO2: 97%   Filed Weights   01/13/20 0801  Weight: 207 lb 12.8 oz (94.3 kg)    GENERAL:alert, no distress and comfortable Musculoskeletal:no cyanosis of digits and no clubbing  NEURO: alert & oriented x 3 with fluent speech, no focal motor/sensory deficits  LABORATORY DATA:  I have reviewed the data as listed    Component Value Date/Time   NA 140 12/30/2019 0835   NA 139 06/05/2017 0746   K 3.6 12/30/2019 0835   K 4.1 06/05/2017 0746   CL 108 12/30/2019 0835   CO2 20 (L) 12/30/2019 0835   CO2 21 (L) 06/05/2017 0746   GLUCOSE 121 (H) 12/30/2019 0835   GLUCOSE 279 (H) 06/05/2017 0746   BUN 26 (H) 12/30/2019 0835   BUN 15.0 06/05/2017 0746   CREATININE 0.86 12/30/2019 0835   CREATININE 0.9 06/05/2017 0746   CALCIUM 9.2 12/30/2019 0835   CALCIUM 10.1 06/05/2017 0746   PROT 7.2 12/30/2019 0835   PROT 8.1 06/05/2017 0746   ALBUMIN 3.7 12/30/2019 0835   ALBUMIN 4.0 06/05/2017 0746   AST 34 12/30/2019 0835   AST 22 06/05/2017 0746   ALT 32 12/30/2019 0835   ALT 36 06/05/2017 0746   ALKPHOS 71 12/30/2019 0835   ALKPHOS 98 06/05/2017 0746   BILITOT 0.3 12/30/2019 0835   BILITOT 0.44 06/05/2017 0746   GFRNONAA >60 12/30/2019 0835   GFRAA >60 12/30/2019 0835    No results found  for: SPEP, UPEP  Lab Results  Component Value Date   WBC 4.7 01/13/2020   NEUTROABS 2.9 01/13/2020   HGB 8.0 (L) 01/13/2020   HCT 25.1 (L) 01/13/2020   MCV 103.3 (H) 01/13/2020   PLT 131 (L) 01/13/2020      Chemistry      Component Value Date/Time   NA 140 12/30/2019 0835   NA 139 06/05/2017 0746   K 3.6 12/30/2019 0835   K 4.1 06/05/2017 0746   CL 108 12/30/2019 0835   CO2 20 (L) 12/30/2019 0835   CO2 21 (L) 06/05/2017 0746   BUN 26 (H) 12/30/2019 0835   BUN 15.0 06/05/2017 0746   CREATININE 0.86 12/30/2019 0835   CREATININE 0.9 06/05/2017 0746      Component Value Date/Time   CALCIUM 9.2 12/30/2019 0835   CALCIUM 10.1 06/05/2017 0746   ALKPHOS 71 12/30/2019 0835   ALKPHOS 98 06/05/2017 0746   AST 34 12/30/2019 0835   AST 22 06/05/2017 0746   ALT 32 12/30/2019 0835   ALT 36 06/05/2017 0746   BILITOT 0.3 12/30/2019 0835   BILITOT 0.44 06/05/2017 0746

## 2020-01-13 NOTE — Assessment & Plan Note (Signed)
She has severe pancytopenia due to treatment despite dose adjustment We also discussed transfusion support We discussed some of the risks, benefits, and alternatives of blood transfusions. The patient is symptomatic from anemia and the hemoglobin level is critically low.  Some of the side-effects to be expected including risks of transfusion reactions, chills, infection, syndrome of volume overload and risk of hospitalization from various reasons and the patient is willing to proceed and went ahead to sign consent today. I have checked serum vitamin B12 and iron studies in February and they are adequate

## 2020-01-13 NOTE — Patient Instructions (Signed)
[facility] Discharge Instructions for Patients Receiving Chemotherapy  Today you received the following chemotherapy agents: Gemzar  To help prevent nausea and vomiting after your treatment, we encourage you to take your nausea medication as prescribed.    If you develop nausea and vomiting that is not controlled by your nausea medication, call the clinic.   BELOW ARE SYMPTOMS THAT SHOULD BE REPORTED IMMEDIATELY:  *FEVER GREATER THAN 100.5 F  *CHILLS WITH OR WITHOUT FEVER  NAUSEA AND VOMITING THAT IS NOT CONTROLLED WITH YOUR NAUSEA MEDICATION  *UNUSUAL SHORTNESS OF BREATH  *UNUSUAL BRUISING OR BLEEDING  TENDERNESS IN MOUTH AND THROAT WITH OR WITHOUT PRESENCE OF ULCERS  *URINARY PROBLEMS  *BOWEL PROBLEMS  UNUSUAL RASH Items with * indicate a potential emergency and should be followed up as soon as possible.  Feel free to call the clinic should you have any questions or concerns. The clinic phone number is [phone].  Please show the CHEMO ALERT CARD at check-in to the Emergency Department and triage nurse.     Blood Transfusion, Adult, Care After This sheet gives you information about how to care for yourself after your procedure. Your doctor may also give you more specific instructions. If you have problems or questions, contact your doctor. What can I expect after the procedure? After the procedure, it is common to have:  Bruising and soreness at the IV site.  A fever or chills on the day of the procedure. This may be your body's response to the new blood cells received.  A headache. Follow these instructions at home: Insertion site care      Follow instructions from your doctor about how to take care of your insertion site. This is where an IV tube was put into your vein. Make sure you: ? Wash your hands with soap and water before and after you change your bandage (dressing). If you cannot use soap and water, use hand sanitizer. ? Change  your bandage as told by your doctor.  Check your insertion site every day for signs of infection. Check for: ? Redness, swelling, or pain. ? Bleeding from the site. ? Warmth. ? Pus or a bad smell. General instructions  Take over-the-counter and prescription medicines only as told by your doctor.  Rest as told by your doctor.  Go back to your normal activities as told by your doctor.  Keep all follow-up visits as told by your doctor. This is important. Contact a doctor if:  You have itching or red, swollen areas of skin (hives).  You feel worried or nervous (anxious).  You feel weak after doing your normal activities.  You have redness, swelling, warmth, or pain around the insertion site.  You have blood coming from the insertion site, and the blood does not stop with pressure.  You have pus or a bad smell coming from the insertion site. Get help right away if:  You have signs of a serious reaction. This may be coming from an allergy or the body's defense system (immune system). Signs include: ? Trouble breathing or shortness of breath. ? Swelling of the face or feeling warm (flushed). ? Fever or chills. ? Head, chest, or back pain. ? Dark pee (urine) or blood in the pee. ? Widespread rash. ? Fast heartbeat. ? Feeling dizzy or light-headed. You may receive your blood transfusion in an outpatient setting. If so, you will be told whom to contact to report any reactions. These symptoms may be an emergency. Do not   wait to see if the symptoms will go away. Get medical help right away. Call your local emergency services (911 in the [place] Do not drive yourself to the hospital. Summary  Bruising and soreness at the IV site are common.  Check your insertion site every day for signs of infection.  Rest as told by your doctor. Go back to your normal activities as told by your doctor.  Get help right away if you have signs of a serious reaction. This information is not intended  to replace advice given to you by your health care provider. Make sure you discuss any questions you have with your health care provider. Document Revised: 12/16/2018 Document Reviewed: 12/16/2018 Elsevier Patient Education  2020 [employer]    

## 2020-01-13 NOTE — Assessment & Plan Note (Signed)
Unfortunately, despite aggressive dose adjustment, she remained pancytopenic We will proceed with similar dose adjustment In addition to chemotherapy, she will also written receive 1 unit of blood She will return in 2 weeks for chemotherapy as scheduled I plan to repeat imaging study before I see her back next month

## 2020-01-14 LAB — TYPE AND SCREEN
ABO/RH(D): O POS
Antibody Screen: NEGATIVE
Unit division: 0

## 2020-01-14 LAB — PTH, INTACT AND CALCIUM
Calcium, Total (PTH): 9.1 mg/dL (ref 8.7–10.3)
PTH: 19 pg/mL (ref 15–65)

## 2020-01-14 LAB — CA 125: Cancer Antigen (CA) 125: 18.9 U/mL (ref 0.0–38.1)

## 2020-01-14 LAB — BPAM RBC
Blood Product Expiration Date: 202107292359
ISSUE DATE / TIME: 202107091003
Unit Type and Rh: 5100

## 2020-01-16 ENCOUNTER — Telehealth: Payer: Self-pay

## 2020-01-16 NOTE — Telephone Encounter (Signed)
PTH and Vitamin D labs faxed to [phone] with attention to Amber.

## 2020-01-17 ENCOUNTER — Telehealth: Payer: Self-pay

## 2020-01-17 NOTE — Telephone Encounter (Signed)
-----   Message from Gardiner Rhyme, RN sent at 01/17/2020  2:28 PM EDT ----- Regarding: FW: pls call and let her know CA-125 is better  ----- Message ----- From: Heath Lark, MD Sent: 01/14/2020   9:08 AM EDT To: [facility] Subject: pls call and let her know CA-125 is better

## 2020-01-17 NOTE — Telephone Encounter (Signed)
Per Dr [name] in basket message Pt. Called relayed message to Pt that CA-125 is better. Pt verbalized understanding. No further problems or concerns noted,

## 2020-01-27 ENCOUNTER — Inpatient Hospital Stay: Payer: Medicare Other

## 2020-01-27 ENCOUNTER — Other Ambulatory Visit: Payer: Self-pay

## 2020-01-27 VITALS — BP 127/66 | HR 78 | Temp 98.6°F | Resp 16

## 2020-01-27 DIAGNOSIS — D6481 Anemia due to antineoplastic chemotherapy: Secondary | ICD-10-CM

## 2020-01-27 DIAGNOSIS — Z7189 Other specified counseling: Secondary | ICD-10-CM

## 2020-01-27 DIAGNOSIS — C561 Malignant neoplasm of right ovary: Secondary | ICD-10-CM

## 2020-01-27 DIAGNOSIS — Z5111 Encounter for antineoplastic chemotherapy: Secondary | ICD-10-CM | POA: Diagnosis not present

## 2020-01-27 LAB — CMP (CANCER CENTER ONLY)
ALT: 35 U/L (ref 0–44)
AST: 44 U/L — ABNORMAL HIGH (ref 15–41)
Albumin: 3.6 g/dL (ref 3.5–5.0)
Alkaline Phosphatase: 82 U/L (ref 38–126)
Anion gap: 13 (ref 5–15)
BUN: 17 mg/dL (ref 8–23)
CO2: 24 mmol/L (ref 22–32)
Calcium: 9.4 mg/dL (ref 8.9–10.3)
Chloride: 105 mmol/L (ref 98–111)
Creatinine: 0.79 mg/dL (ref 0.44–1.00)
GFR, Est AFR Am: >60 mL/min (ref >60)
GFR, Estimated: >60 mL/min (ref >60)
Glucose, Bld: 107 mg/dL — ABNORMAL HIGH (ref 70–99)
Potassium: 4.3 mmol/L (ref 3.5–5.1)
Sodium: 142 mmol/L (ref 135–145)
Total Bilirubin: 0.4 mg/dL (ref 0.3–1.2)
Total Protein: 7.6 g/dL (ref 6.5–8.1)

## 2020-01-27 LAB — CBC WITH DIFFERENTIAL (CANCER CENTER ONLY)
Abs Immature Granulocytes: 0.02 10*3/uL (ref 0.00–0.07)
Basophils Absolute: 0 10*3/uL (ref 0.0–0.1)
Basophils Relative: 1 %
Eosinophils Absolute: 0 10*3/uL (ref 0.0–0.5)
Eosinophils Relative: 1 %
HCT: 28.6 % — ABNORMAL LOW (ref 36.0–46.0)
Hemoglobin: 9.1 g/dL — ABNORMAL LOW (ref 12.0–15.0)
Immature Granulocytes: 1 %
Lymphocytes Relative: 21 %
Lymphs Abs: 0.9 10*3/uL (ref 0.7–4.0)
MCH: 33.6 pg (ref 26.0–34.0)
MCHC: 31.8 g/dL (ref 30.0–36.0)
MCV: 105.5 fL — ABNORMAL HIGH (ref 80.0–100.0)
Monocytes Absolute: 0.5 10*3/uL (ref 0.1–1.0)
Monocytes Relative: 12 %
Neutro Abs: 2.7 10*3/uL (ref 1.7–7.7)
Neutrophils Relative %: 64 %
Platelet Count: 135 10*3/uL — ABNORMAL LOW (ref 150–400)
RBC: 2.71 MIL/uL — ABNORMAL LOW (ref 3.87–5.11)
RDW: 21.5 % — ABNORMAL HIGH (ref 11.5–15.5)
WBC Count: 4.1 10*3/uL (ref 4.0–10.5)
nRBC: 0 % (ref 0.0–0.2)

## 2020-01-27 LAB — SAMPLE TO BLOOD BANK

## 2020-01-27 MED ORDER — HEPARIN SOD (PORK) LOCK FLUSH 100 UNIT/ML IV SOLN
500.0000 [IU] | Freq: Once | INTRAVENOUS | Status: AC | PRN
  Administered 2020-01-27: 500 [IU]
  Filled 2020-01-27: qty 5

## 2020-01-27 MED ORDER — SODIUM CHLORIDE 0.9 % IV SOLN
400.0000 mg/m2 | Freq: Once | INTRAVENOUS | Status: AC
  Administered 2020-01-27: 836 mg via INTRAVENOUS
  Filled 2020-01-27: qty 21.99

## 2020-01-27 MED ORDER — FAMOTIDINE IN NACL 20-0.9 MG/50ML-% IV SOLN
INTRAVENOUS | Status: AC
  Filled 2020-01-27: qty 50

## 2020-01-27 MED ORDER — SODIUM CHLORIDE 0.9 % IV SOLN
150.0000 mg | Freq: Once | INTRAVENOUS | Status: AC
  Administered 2020-01-27: 150 mg via INTRAVENOUS
  Filled 2020-01-27: qty 150

## 2020-01-27 MED ORDER — DIPHENHYDRAMINE HCL 50 MG/ML IJ SOLN
50.0000 mg | Freq: Once | INTRAMUSCULAR | Status: AC
  Administered 2020-01-27: 50 mg via INTRAVENOUS

## 2020-01-27 MED ORDER — DIPHENHYDRAMINE HCL 50 MG/ML IJ SOLN
INTRAMUSCULAR | Status: AC
  Filled 2020-01-27: qty 1

## 2020-01-27 MED ORDER — PALONOSETRON HCL INJECTION 0.25 MG/5ML
INTRAVENOUS | Status: AC
  Filled 2020-01-27: qty 5

## 2020-01-27 MED ORDER — SODIUM CHLORIDE 0.9 % IV SOLN
10.0000 mg | Freq: Once | INTRAVENOUS | Status: AC
  Administered 2020-01-27: 10 mg via INTRAVENOUS
  Filled 2020-01-27: qty 10

## 2020-01-27 MED ORDER — SODIUM CHLORIDE 0.9% FLUSH
10.0000 mL | INTRAVENOUS | Status: DC | PRN
  Administered 2020-01-27: 10 mL
  Filled 2020-01-27: qty 10

## 2020-01-27 MED ORDER — FAMOTIDINE IN NACL 20-0.9 MG/50ML-% IV SOLN
20.0000 mg | Freq: Once | INTRAVENOUS | Status: AC
  Administered 2020-01-27: 20 mg via INTRAVENOUS

## 2020-01-27 MED ORDER — SODIUM CHLORIDE 0.9 % IV SOLN
Freq: Once | INTRAVENOUS | Status: AC
  Filled 2020-01-27: qty 250

## 2020-01-27 MED ORDER — SODIUM CHLORIDE 0.9 % IV SOLN
432.0000 mg | Freq: Once | INTRAVENOUS | Status: AC
  Administered 2020-01-27: 430 mg via INTRAVENOUS
  Filled 2020-01-27: qty 43

## 2020-01-27 MED ORDER — PALONOSETRON HCL INJECTION 0.25 MG/5ML
0.2500 mg | Freq: Once | INTRAVENOUS | Status: AC
  Administered 2020-01-27: 0.25 mg via INTRAVENOUS

## 2020-01-27 NOTE — Patient Instructions (Signed)
[facility] Discharge Instructions for Patients Receiving Chemotherapy  Today you received the following chemotherapy agents: Gemzar and Carboplatin  To help prevent nausea and vomiting after your treatment, we encourage you to take your nausea medication as prescribed.    If you develop nausea and vomiting that is not controlled by your nausea medication, call the clinic.   BELOW ARE SYMPTOMS THAT SHOULD BE REPORTED IMMEDIATELY:  *FEVER GREATER THAN 100.5 F  *CHILLS WITH OR WITHOUT FEVER  NAUSEA AND VOMITING THAT IS NOT CONTROLLED WITH YOUR NAUSEA MEDICATION  *UNUSUAL SHORTNESS OF BREATH  *UNUSUAL BRUISING OR BLEEDING  TENDERNESS IN MOUTH AND THROAT WITH OR WITHOUT PRESENCE OF ULCERS  *URINARY PROBLEMS  *BOWEL PROBLEMS  UNUSUAL RASH Items with * indicate a potential emergency and should be followed up as soon as possible.  Feel free to call the clinic should you have any questions or concerns. The clinic phone number is [phone].  Please show the CHEMO ALERT CARD at check-in to the Emergency Department and triage nurse.   

## 2020-02-09 ENCOUNTER — Ambulatory Visit (HOSPITAL_COMMUNITY)
Admission: RE | Admit: 2020-02-09 | Discharge: 2020-02-09 | Disposition: A | Discharge status: Home / Self Care | Payer: Medicare Other | Source: Ambulatory Visit | Attending: Hematology and Oncology | Admitting: Hematology and Oncology

## 2020-02-09 ENCOUNTER — Other Ambulatory Visit: Payer: Self-pay

## 2020-02-09 DIAGNOSIS — C561 Malignant neoplasm of right ovary: Secondary | ICD-10-CM | POA: Diagnosis not present

## 2020-02-09 MED ORDER — IOHEXOL 300 MG/ML  SOLN
100.0000 mL | Freq: Once | INTRAMUSCULAR | Status: AC | PRN
  Administered 2020-02-09: 100 mL via INTRAVENOUS

## 2020-02-09 MED ORDER — HEPARIN SOD (PORK) LOCK FLUSH 100 UNIT/ML IV SOLN
INTRAVENOUS | Status: AC
  Filled 2020-02-09: qty 5

## 2020-02-09 MED ORDER — IOHEXOL 9 MG/ML PO SOLN
1000.0000 mL | ORAL | Status: AC
  Administered 2020-02-09: 1000 mL via ORAL

## 2020-02-09 MED ORDER — HEPARIN SOD (PORK) LOCK FLUSH 100 UNIT/ML IV SOLN
500.0000 [IU] | Freq: Once | INTRAVENOUS | Status: AC
  Administered 2020-02-09: 500 [IU] via INTRAVENOUS

## 2020-02-09 MED ORDER — IOHEXOL 9 MG/ML PO SOLN
ORAL | Status: AC
  Administered 2020-02-09: 500 mL
  Filled 2020-02-09: qty 1000

## 2020-02-09 MED ORDER — SODIUM CHLORIDE (PF) 0.9 % IJ SOLN
INTRAMUSCULAR | Status: AC
  Filled 2020-02-09: qty 50

## 2020-02-10 ENCOUNTER — Inpatient Hospital Stay (HOSPITAL_BASED_OUTPATIENT_CLINIC_OR_DEPARTMENT_OTHER): Payer: Medicare Other | Admitting: Hematology and Oncology

## 2020-02-10 ENCOUNTER — Other Ambulatory Visit: Payer: Self-pay | Admitting: Hematology and Oncology

## 2020-02-10 ENCOUNTER — Encounter: Payer: Self-pay | Admitting: Hematology and Oncology

## 2020-02-10 ENCOUNTER — Inpatient Hospital Stay: Payer: Medicare Other

## 2020-02-10 ENCOUNTER — Inpatient Hospital Stay: Payer: Medicare Other | Attending: Gynecology

## 2020-02-10 ENCOUNTER — Telehealth: Payer: Self-pay | Admitting: Hematology and Oncology

## 2020-02-10 ENCOUNTER — Other Ambulatory Visit: Payer: Self-pay

## 2020-02-10 DIAGNOSIS — C561 Malignant neoplasm of right ovary: Secondary | ICD-10-CM

## 2020-02-10 DIAGNOSIS — D61810 Antineoplastic chemotherapy induced pancytopenia: Secondary | ICD-10-CM | POA: Insufficient documentation

## 2020-02-10 DIAGNOSIS — T451X5A Adverse effect of antineoplastic and immunosuppressive drugs, initial encounter: Secondary | ICD-10-CM

## 2020-02-10 DIAGNOSIS — E119 Type 2 diabetes mellitus without complications: Secondary | ICD-10-CM | POA: Insufficient documentation

## 2020-02-10 DIAGNOSIS — D6481 Anemia due to antineoplastic chemotherapy: Secondary | ICD-10-CM

## 2020-02-10 DIAGNOSIS — Z7189 Other specified counseling: Secondary | ICD-10-CM

## 2020-02-10 DIAGNOSIS — Z5111 Encounter for antineoplastic chemotherapy: Secondary | ICD-10-CM | POA: Diagnosis present

## 2020-02-10 DIAGNOSIS — D61818 Other pancytopenia: Secondary | ICD-10-CM

## 2020-02-10 LAB — CMP (CANCER CENTER ONLY)
ALT: 44 U/L (ref 0–44)
AST: 51 U/L — ABNORMAL HIGH (ref 15–41)
Albumin: 3.6 g/dL (ref 3.5–5.0)
Alkaline Phosphatase: 115 U/L (ref 38–126)
Anion gap: 10 (ref 5–15)
BUN: 12 mg/dL (ref 8–23)
CO2: 22 mmol/L (ref 22–32)
Calcium: 8.9 mg/dL (ref 8.9–10.3)
Chloride: 107 mmol/L (ref 98–111)
Creatinine: 0.77 mg/dL (ref 0.44–1.00)
GFR, Est AFR Am: >60 mL/min (ref >60)
GFR, Estimated: >60 mL/min (ref >60)
Glucose, Bld: 100 mg/dL — ABNORMAL HIGH (ref 70–99)
Potassium: 4.4 mmol/L (ref 3.5–5.1)
Sodium: 139 mmol/L (ref 135–145)
Total Bilirubin: 0.3 mg/dL (ref 0.3–1.2)
Total Protein: 7.6 g/dL (ref 6.5–8.1)

## 2020-02-10 LAB — CBC WITH DIFFERENTIAL (CANCER CENTER ONLY)
Abs Immature Granulocytes: 0.02 10*3/uL (ref 0.00–0.07)
Basophils Absolute: 0 10*3/uL (ref 0.0–0.1)
Basophils Relative: 1 %
Eosinophils Absolute: 0.1 10*3/uL (ref 0.0–0.5)
Eosinophils Relative: 1 %
HCT: 22.6 % — ABNORMAL LOW (ref 36.0–46.0)
Hemoglobin: 7.5 g/dL — ABNORMAL LOW (ref 12.0–15.0)
Immature Granulocytes: 1 %
Lymphocytes Relative: 22 %
Lymphs Abs: 0.8 10*3/uL (ref 0.7–4.0)
MCH: 34.7 pg — ABNORMAL HIGH (ref 26.0–34.0)
MCHC: 33.2 g/dL (ref 30.0–36.0)
MCV: 104.6 fL — ABNORMAL HIGH (ref 80.0–100.0)
Monocytes Absolute: 0.6 10*3/uL (ref 0.1–1.0)
Monocytes Relative: 15 %
Neutro Abs: 2.3 10*3/uL (ref 1.7–7.7)
Neutrophils Relative %: 60 %
Platelet Count: 69 10*3/uL — ABNORMAL LOW (ref 150–400)
RBC: 2.16 MIL/uL — ABNORMAL LOW (ref 3.87–5.11)
RDW: 20.3 % — ABNORMAL HIGH (ref 11.5–15.5)
WBC Count: 3.7 10*3/uL — ABNORMAL LOW (ref 4.0–10.5)
nRBC: 0 % (ref 0.0–0.2)

## 2020-02-10 LAB — PREPARE RBC (CROSSMATCH)

## 2020-02-10 LAB — SAMPLE TO BLOOD BANK

## 2020-02-10 MED ORDER — HEPARIN SOD (PORK) LOCK FLUSH 100 UNIT/ML IV SOLN
500.0000 [IU] | Freq: Every day | INTRAVENOUS | Status: AC | PRN
  Administered 2020-02-10: 500 [IU]
  Filled 2020-02-10: qty 5

## 2020-02-10 MED ORDER — ACETAMINOPHEN 325 MG PO TABS
ORAL_TABLET | ORAL | Status: AC
  Filled 2020-02-10: qty 2

## 2020-02-10 MED ORDER — ACETAMINOPHEN 325 MG PO TABS
650.0000 mg | ORAL_TABLET | Freq: Once | ORAL | Status: AC
  Administered 2020-02-10: 650 mg via ORAL

## 2020-02-10 MED ORDER — SODIUM CHLORIDE 0.9% FLUSH
10.0000 mL | INTRAVENOUS | Status: AC | PRN
  Administered 2020-02-10: 10 mL
  Filled 2020-02-10: qty 10

## 2020-02-10 MED ORDER — SODIUM CHLORIDE 0.9% IV SOLUTION
250.0000 mL | Freq: Once | INTRAVENOUS | Status: AC
  Administered 2020-02-10: 250 mL via INTRAVENOUS
  Filled 2020-02-10: qty 250

## 2020-02-10 MED ORDER — SODIUM CHLORIDE 0.9% FLUSH
10.0000 mL | Freq: Once | INTRAVENOUS | Status: AC
  Administered 2020-02-10: 10 mL
  Filled 2020-02-10: qty 10

## 2020-02-10 NOTE — Assessment & Plan Note (Signed)
Her blood sugar is improving with recent dietary modification and weight loss strategies I congratulated her efforts I recommend she continues with dietary modification She has appointment to see primary care doctor soon for medication adjustment

## 2020-02-10 NOTE — Assessment & Plan Note (Signed)
She has severe pancytopenia due to treatment despite dose adjustment We also discussed transfusion support We discussed some of the risks, benefits, and alternatives of blood transfusions. The patient is symptomatic from anemia and the hemoglobin level is critically low.  Some of the side-effects to be expected including risks of transfusion reactions, chills, infection, syndrome of volume overload and risk of hospitalization from various reasons and the patient is willing to proceed and went ahead to sign consent today. I have checked serum vitamin B12 and iron studies in February and they are adequate She will receive a unit of blood In the future, we would drop carboplatin and proceed with single agent gemcitabine only

## 2020-02-10 NOTE — Progress Notes (Signed)
[place] OFFICE PROGRESS NOTE  Patient Care Team: Lilian Coma., MD as PCP - General (Internal Medicine)  ASSESSMENT & PLAN:  Ovarian cancer on right [facility]) I have reviewed multiple imaging studies with the patient and family The patient have near complete response to therapy This is at the expense of severe intermittent pancytopenia requiring blood transfusion support We discussed the risk and benefits of continuing combination chemotherapy with carboplatin and gemcitabine versus gemcitabine alone as maintenance treatment Ultimately, the patiernt has made informed decision to proceed with gemcitabine alone in the future Today, due to severe pancytopenia, we will proceed with transfusion support only with 1 unit of blood She is in agreement I recommend 3 more months of single agent gemcitabine before repeating another imaging study, due around November  Pancytopenia, acquired [facility]) She has severe pancytopenia due to treatment despite dose adjustment We also discussed transfusion support We discussed some of the risks, benefits, and alternatives of blood transfusions. The patient is symptomatic from anemia and the hemoglobin level is critically low.  Some of the side-effects to be expected including risks of transfusion reactions, chills, infection, syndrome of volume overload and risk of hospitalization from various reasons and the patient is willing to proceed and went ahead to sign consent today. I have checked serum vitamin B12 and iron studies in February and they are adequate She will receive a unit of blood In the future, we would drop carboplatin and proceed with single agent gemcitabine only   Type 2 diabetes mellitus without complication (HCC) Her blood sugar is improving with recent dietary modification and weight loss strategies I congratulated her efforts I recommend she continues with dietary modification She has appointment to see primary care doctor soon  for medication adjustment  Goals of care, counseling/discussion We have extensive discussions about goals of care She understood that treatment goal is palliative in nature She understood the role of maintenance treatment    Orders Placed This Encounter  Procedures  . Prepare RBC (crossmatch)    Standing Status:   Standing    Number of Occurrences:   1    Order Specific Question:   # of Units    Answer:   1 unit    Order Specific Question:   Transfusion Indications    Answer:   Symptomatic Anemia    Order Specific Question:   Number of Units to Keep Ahead    Answer:   NO units ahead    Order Specific Question:   Instructions:    Answer:   Transfuse    Order Specific Question:   If emergent release call blood bank    Answer:   Not emergent release    All questions were answered. The patient knows to call the clinic with any problems, questions or concerns. The total time spent in the appointment was 40 minutes encounter with patients including review of chart and various tests results, discussions about plan of care and coordination of care plan   Heath Lark, MD 02/10/2020 1:22 PM  INTERVAL HISTORY: Please see below for problem oriented charting. She returns with her sister and husband for further follow-up She tolerated recent chemotherapy well without nausea, vomiting or changes in bowel habits She complained of some mild fatigue The patient denies any recent signs or symptoms of bleeding such as spontaneous epistaxis, hematuria or hematochezia. 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    03/20/2017 Imaging   GYN ultrasound was performed this revealed a small fundus measuring 5 cm there are 2 small fibroids within the fundus with the largest measuring 1.3 cm Within the endometrium there is a small amount a fluid the endometrial wall was slightly thickened at 4.8 mm the ultrasound was consistent with a small endometrial polyp  The right adnexa measured 7.4 x 6.7 cm contained a both a cystic and solid mass with a small amount of shadowing This is consistent with a dermoid tumor the left ovary was 2.6 x 1.5 x 1.0 cm There is no other adnexal masses no free fluid within the abdomen no ascites     04/10/2017 Surgery   Operation: Operative Laparscopy with  Peritoneal biopsy 2 hysteroscopy D&C Surgeon: Lynder Parents MD  Specimens: Peritoneal biopsy and cellular washings  Complications: None FIndings Small amount of ascites was found cellular washings were sent There was peritoneal studding And omental caking Even in steep Trendelenburg the ovaries and uterus were unable to be visualized due to the small intestines in the lower pelvis     04/10/2017 Pathology Results   Biopsy from [facility] was nondiagnostic.  Peritoneum biopsy did not reveal malignancy.  Endometrial biopsy also did not reveal malignancy.   04/13/2017 Tumor Marker   Patient's tumor was tested for the following markers: CA-125 Results of the tumor marker test revealed 324.1   04/20/2017 Tumor Marker   Patient's tumor was tested for the following markers: CA-125 Results of the tumor marker test revealed 345.6   04/22/2017 Pathology Results   Peritoneum, biopsy - CARCINOMA. - SEE COMMENT.   04/22/2017 Procedure   CT-guided core biopsy performed of peritoneal tumor in the left lower quadrant.   04/24/2017  - 09/25/2017 Chemotherapy   She received carboplatin and Taxol x 3 cycles followed by interval surgical debulking on 12/31. Chemotherapy was subsequently resumed for 3 more cycles   06/05/2017 Tumor Marker   Patient's tumor was tested for the following markers: CA-125 Results of the tumor marker test revealed 27.5   06/17/2017 Imaging   1. Considerable improvement in the prior omental caking, which now presents mainly as an indistinct stranding with slight nodularity along the left upper quadrant omentum. 2. Stable appearance of right ovarian mass with fatty and calcific elements classic for a dermoid. 3. There is accentuated enhancement in the walls of the common hepatic duct and common bile duct. A low-grade cholangitis is not readily excluded. 4. Other imaging findings of potential clinical significance: Mild cardiomegaly. Aortic Atherosclerosis (ICD10-I70.0). Lumbar scoliosis, spondylosis, degenerative disc disease, and pars defects at L5. These cause impingement at L5-S1 and lesser impingement at L3-4 and L4-5. 5. 65% compression fracture at L1 slightly worsened than on the prior exam, with associated vertebral sclerosis and posterior retropulsion.   07/06/2017 Surgery   Bilateral Salpingo-Oophorectomy W/Omentectomy, Total Abd Hysterectomy & Radical Dissection For Debulking Right - Ureterolysis, With Or Without Repositioning Of Ureter For Retroperitoneal Fibrosis   07/06/2017 Pathology Results   A: Omentum, omentectomy -  Omentum with minimal residual carcinoma, consistent with high grade serous carcinoma (microscopic; stage ypT3a) - Extensive treatment effect with calcifications, fibrosis, histiocytes, and giant cell response - CRS score 3  B: Uterus with cervix and left ovary and fallopian tube, hysterectomy and left salpingo-oophorectomy - Cervix: Ectocervix and endocervix with no dysplasia or malignancy identified - Endometrium: Inactive with no hyperplasia, atypia, or malignancy  identified - Myometrium: Adenomyosis and benign leiomyoma, size 1.2 cm - Serosa: Focal calcification and histiocytes suggestive of treatment effect, with no viable carcinoma identified - Ovary, left: Physiologic changes and no carcinoma identified - Fallopian tube, left: 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 and no malignancy identified  C: Ovary and fallopian tube, right, salpingo-oophorectomy - Right fallopian tube with focal residual carcinoma, consistent with high grade serous carcinoma, and serous tubal intraepithelial carcinoma (STIC) - Associated calcifications and histiocytes suggestive of treatment response - Right ovary with calcifications and histiocytes suggestive of treatment effect, with no definite viable carcinoma identified - Mature cystic teratoma, 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 - See synoptic report and comment   08/14/2017 Tumor Marker   Patient's tumor was tested for the following markers: CA-125 Results of the tumor marker test revealed 14.6   09/04/2017 Tumor Marker   Patient's tumor was tested for the following markers: CA-125 Results of the tumor marker test revealed 12.5   10/03/2017 Genetic Testing   The Common Hereditary Cancer Panel offered by [facility] includes sequencing and/or deletion duplication testing of the following 47 genes: APC, ATM, AXIN2, BARD1, BMPR1A, BRCA1, BRCA2, BRIP1, CDH1, CDKN2A (p14ARF), CDKN2A (p16INK4a), CKD4, CHEK2, CTNNA1, DICER1, EPCAM (Deletion/duplication testing only), GREM1 (promoter region deletion/duplication testing only), KIT, MEN1, MLH1, MSH2, MSH3, MSH6, MUTYH, NBN, NF1, NHTL1, PALB2, PDGFRA, PMS2, POLD1, POLE, PTEN, RAD50, RAD51C, RAD51D, SDHB, SDHC, SDHD, SMAD4, SMARCA4. STK11, TP53, TSC1, TSC2, and VHL.  The following genes were evaluated for sequence changes only: SDHA and HOXB13 c.251G>A variant only.  Results: Negative, no pathogenic variants identified.  The date of this test report is 10/03/2017.    10/28/2017 Imaging   1. Stable  pulmonary nodules. No new or progressive findings. 2. No residual or recurrent omental disease is identified. No obvious peritoneal surface disease. 3. Increasing soft tissue density and ill-defined interstitial changes around the abdominal wall surgical incision. This may be progressive postoperative changes or granulation tissue but attention on future scans to exclude the possibility of tumor. 4. Status post resection of right adnexal mass. No pelvic mass or pelvic adenopathy. 5. Stable T12 compression fracture.   01/25/2018 Tumor Marker   Patient's tumor was tested for the following markers: CA-125 Results of the tumor marker test revealed 9.2   12/10/2018 Tumor Marker   Patient's tumor was tested for the following markers: CA-125 Results of the tumor marker test revealed 212   12/22/2018 Imaging   1. Interval development of multiple soft tissue lesions in the gastrocolic ligament, mesentery, and along the peritoneal surface. These soft tissue lesions measure up to 6.5 cm in size. Associated interval development of moderate volume ascites in the abdomen and pelvis. Imaging features are consistent with metastatic disease. 2. Soft tissue projects through the rectus sheath in the right paraumbilical region, likely herniated small bowel as there is a large amount of decompressed small bowel does deep to this finding. However, metastatic deposit at this location is also possibility.  3. Trace pneumobilia in the left liver suggests prior sphincterotomy. 4.  Aortic Atherosclerois (ICD10-170.0)   12/30/2018 Procedure   Successful ultrasound-guided therapeutic paracentesis yielding  4.5 liters of peritoneal fluid.   12/30/2018 Procedure   Technically successful right IJ power-injectable port catheter placement. Ready for routine use.   12/31/2018 -  Chemotherapy   The patient had carboplatin and taxol for chemotherapy treatment.     01/21/2019 Tumor Marker   Patient's tumor was tested for the  following markers: CA-125 Results of the tumor marker test revealed 408   02/11/2019 Tumor Marker   Patient's tumor was tested for the following markers: CA-125 Results of the tumor marker test revealed 646.   03/02/2019 Imaging   1. Marked improvement in peritoneal carcinomatosis/ascites, without complete resolution. 2.  Aortic atherosclerosis (ICD10-170.0).     03/25/2019 Tumor Marker   Patient's tumor was tested for the following markers: CA-125 Results of the tumor marker test revealed 24.1.   04/15/2019 Tumor Marker   Patient's tumor was tested for the following markers: CA-125 Results of the tumor marker test revealed 15.3   05/18/2019 Imaging   1. There is progressive, nearly complete resolution of peritoneal and omental nodularity seen on prior examination with very faint residual peritoneal and omental haziness (series 2, image 50, 39).   2. Interval resolution of ascites.   3.  Status post hysterectomy and oophorectomy.   4.  Coronary artery disease.  Aortic Atherosclerosis (ICD10-I70.0).      Tumor Marker   Patient's tumor was tested for the following markers: CA-125 Results of the tumor marker test revealed 11.2.   06/10/2019 Tumor Marker   Patient's tumor was tested for the following markers: CA-125 Results of the tumor marker test revealed 9.1   07/29/2019 Tumor Marker   Patient's tumor was tested for the following markers: CA-125 Results of the tumor marker test revealed 8.8   08/18/2019 Imaging   1. No evidence of new or progressive metastatic disease in the abdomen or pelvis. Stable small nodular soft tissue focus at the right umbilicus. No new peritoneal implants. No ascites. 2. Chronic findings include: Aortic Atherosclerosis (ICD10-I70.0). Coronary atherosclerosis. Chronic moderate T12 vertebral compression fractures.   09/09/2019 Tumor Marker   Patient's tumor was tested for the following markers: CA-125 Results of the tumor marker test revealed 11.4.    10/21/2019 Tumor Marker   Patient's tumor was tested for the following markers: CA-125 Results of the tumor marker test revealed 21.5   10/31/2019 Imaging   1. New hepatic and peritoneal metastatic disease. 2.  Aortic atherosclerosis (ICD10-I70.0).     11/11/2019 -  Chemotherapy   The patient had carboplatin and gemzar for chemotherapy treatment.     11/11/2019 Tumor Marker   Patient's tumor was tested for the following markers: CA-125 Results of the tumor marker test revealed 40.2    Genetic Testing   Patient has genetic testing done for PD-L1. Results revealed patient has the following:  PD-L1 staining in tumor cells (TC): <1% PD-L1 staining in tumor-associated immune cells (IC): 0% PD-L1 combined positive score (CPS): <1%   11/11/2019 -  Chemotherapy   The patient had carboplatin and gemzar for chemotherapy treatment.     12/16/2019 Tumor Marker   Patient's tumor was tested for the following markers: CA-125 Results of the tumor marker test revealed 41.7   01/13/2020 Tumor Marker   Patient's tumor was tested for the following markers: CA-125 Results of the tumor marker test revealed 18.9   02/09/2020 Imaging   1. Positive response to therapy. 2. Hypodense lesions in liver are smaller or no longer measurable. 3. No clear residual peritoneal metastasis. 4. No ascites.  5. Persistent enhancement of the common bile duct suggest biliary inflammation.       REVIEW OF SYSTEMS:   Constitutional: Denies fevers, chills or abnormal weight loss Eyes: Denies blurriness of vision Ears, nose, mouth, throat, and face: Denies mucositis or sore throat Respiratory: Denies cough, dyspnea or wheezes Cardiovascular: Denies palpitation, chest discomfort or lower extremity swelling Gastrointestinal:  Denies nausea, heartburn or change in bowel habits Skin: Denies abnormal skin rashes Lymphatics: Denies new lymphadenopathy or easy bruising Neurological:Denies numbness, tingling or new  weaknesses Behavioral/Psych: Mood is stable, no new changes  All other systems were reviewed with the patient and are negative.  I have reviewed the past medical history, past surgical history, social history and family history with the patient and they are unchanged from previous note.  ALLERGIES:  is allergic to aspirin, nsaids, and tramadol.  MEDICATIONS:  Current Outpatient Medications  Medication Sig Dispense Refill  . acetaminophen (TYLENOL) 650 MG CR tablet Take 1,300 mg by mouth every 8 (eight) hours.     Marland Kitchen alendronate (FOSAMAX) 70 MG tablet Take 70 mg by mouth every Sunday.     Marland Kitchen atorvastatin (LIPITOR) 80 MG tablet Take 40 mg by mouth at bedtime.     . Cholecalciferol (VITAMIN D3) 2000 units TABS Take 2,000 Units by mouth daily.    . citalopram (CELEXA) 10 MG tablet Take 10 mg by mouth at bedtime    . ferrous sulfate 325 (65 FE) MG tablet Take 325 mg by mouth daily. 3 hours after meal and 30 min before a meal    . gabapentin (NEURONTIN) 300 MG capsule Take by mouth.    . Glucosamine Sulfate 500 MG TABS Take 500 mg by mouth 2 (two) times daily.     Marland Kitchen lidocaine-prilocaine (EMLA) cream Apply to affected area once 30 g 3  . loratadine (CLARITIN) 10 MG tablet Take 10 mg by mouth at bedtime.     Marland Kitchen losartan (COZAAR) 25 MG tablet Take 25 mg by mouth daily.     . meclizine (ANTIVERT) 25 MG tablet Take 25 mg by mouth 2 (two) times daily as needed for dizziness.     . metFORMIN (GLUCOPHAGE) 1000 MG tablet Take 1,000 mg by mouth 2 (two) times daily with a meal.     . omeprazole (PRILOSEC) 40 MG capsule Take 40 mg by mouth daily.     . ondansetron (ZOFRAN) 8 MG tablet Take 1 tablet (8 mg total) by mouth every 8 (eight) hours as needed for refractory nausea / vomiting. 30 tablet 1  . pioglitazone (ACTOS) 15 MG tablet Take 15 mg by mouth at bedtime.     . prochlorperazine (COMPAZINE) 10 MG tablet Take 1 tablet (10 mg total) by mouth every 6 (six) hours as needed (Nausea or vomiting). 30 tablet 1   . Pyridoxine HCl (VITAMIN B-6 PO) Take 50 mg by mouth 1 day or 1 dose.    . vitamin B-12 (CYANOCOBALAMIN) 1000 MCG tablet Take 1,000 mcg by mouth daily.      No current facility-administered medications for this visit.   Facility-Administered Medications Ordered in Other Visits  Medication Dose Route Frequency Provider Last Rate Last Admin  . sodium chloride flush (NS) 0.9 % injection 10 mL  10 mL Intracatheter Once [name], Tezra Mahr, MD        PHYSICAL EXAMINATION: ECOG PERFORMANCE STATUS: 2 - Symptomatic, <50% confined to bed  Vitals:   02/10/20 0843  BP: (!) 130/55  Pulse: 88  Resp: 18  Temp:  98.2 F (36.8 C)  SpO2: 100%   Filed Weights   02/10/20 0843  Weight: 204 lb 3.2 oz (92.6 kg)    GENERAL:alert, no distress and comfortable.  She looks pale NEURO: alert & oriented x 3 with fluent speech, no focal motor/sensory deficits  LABORATORY DATA:  I have reviewed the data as listed    Component Value Date/Time   NA 139 02/10/2020 0814   NA 139 06/05/2017 0746   K 4.4 02/10/2020 0814   K 4.1 06/05/2017 0746   CL 107 02/10/2020 0814   CO2 22 02/10/2020 0814   CO2 21 (L) 06/05/2017 0746   GLUCOSE 100 (H) 02/10/2020 0814   GLUCOSE 279 (H) 06/05/2017 0746   BUN 12 02/10/2020 0814   BUN 15.0 06/05/2017 0746   CREATININE 0.77 02/10/2020 0814   CREATININE 0.9 06/05/2017 0746   CALCIUM 8.9 02/10/2020 0814   CALCIUM 9.1 01/13/2020 0735   CALCIUM 10.1 06/05/2017 0746   PROT 7.6 02/10/2020 0814   PROT 8.1 06/05/2017 0746   ALBUMIN 3.6 02/10/2020 0814   ALBUMIN 4.0 06/05/2017 0746   AST 51 (H) 02/10/2020 0814   AST 22 06/05/2017 0746   ALT 44 02/10/2020 0814   ALT 36 06/05/2017 0746   ALKPHOS 115 02/10/2020 0814   ALKPHOS 98 06/05/2017 0746   BILITOT 0.3 02/10/2020 0814   BILITOT 0.44 06/05/2017 0746   GFRNONAA >60 02/10/2020 0814   GFRAA >60 02/10/2020 0814    No results found for: SPEP, UPEP  Lab Results  Component Value Date   WBC 3.7 (L) 02/10/2020   NEUTROABS  2.3 02/10/2020   HGB 7.5 (L) 02/10/2020   HCT 22.6 (L) 02/10/2020   MCV 104.6 (H) 02/10/2020   PLT 69 (L) 02/10/2020      Chemistry      Component Value Date/Time   NA 139 02/10/2020 0814   NA 139 06/05/2017 0746   K 4.4 02/10/2020 0814   K 4.1 06/05/2017 0746   CL 107 02/10/2020 0814   CO2 22 02/10/2020 0814   CO2 21 (L) 06/05/2017 0746   BUN 12 02/10/2020 0814   BUN 15.0 06/05/2017 0746   CREATININE 0.77 02/10/2020 0814   CREATININE 0.9 06/05/2017 0746      Component Value Date/Time   CALCIUM 8.9 02/10/2020 0814   CALCIUM 9.1 01/13/2020 0735   CALCIUM 10.1 06/05/2017 0746   ALKPHOS 115 02/10/2020 0814   ALKPHOS 98 06/05/2017 0746   AST 51 (H) 02/10/2020 0814   AST 22 06/05/2017 0746   ALT 44 02/10/2020 0814   ALT 36 06/05/2017 0746   BILITOT 0.3 02/10/2020 0814   BILITOT 0.44 06/05/2017 0746       RADIOGRAPHIC STUDIES: I have reviewed multiple imaging studies with the patient I have personally reviewed the radiological images as listed and agreed with the findings in the report. CT ABDOMEN PELVIS W CONTRAST  Result Date: 02/09/2020 CLINICAL DATA:  Ovarian carcinoma.  Assess response to treatment EXAM: CT ABDOMEN AND PELVIS WITH CONTRAST TECHNIQUE: Multidetector CT imaging of the abdomen and pelvis was performed using the standard protocol following bolus administration of intravenous contrast. CONTRAST:  142m OMNIPAQUE IOHEXOL 300 MG/ML  SOLN COMPARISON:  None. FINDINGS: Lower chest: 5 mm nodule in the RIGHT middle lobe is unchanged from CT 08/18/2019. No new pulmonary nodules. Hepatobiliary: Postcholecystectomy. No biliary dilatation. There is prominence of the extrahepatic bile duct with enhancement of the common hepatic duct wall (image 131/2). This is similar to prior exam. Low-density  lesion measured in the medial RIGHT hepatic lobe on comparison exam is no longer evident. Small lesion adjacent to the bifurcation of the LEFT portal vein measures 5 mm decreased from 15  mm (image 27/2). Pancreas: Fatty replacement of the pancreas. Spleen: Normal spleen.  Small splenule inferior to the spleen. Adrenals/urinary tract: Adrenal glands and kidneys are normal. The ureters and bladder normal. Stomach/Bowel: Stomach, small-bowel appendix cecum normal. Colon and rectosigmoid colon normal. Vascular/Lymphatic: Abdominal aorta is normal caliber with atherosclerotic calcification. There is no retroperitoneal or periportal lymphadenopathy. No pelvic lymphadenopathy. Reproductive: Post hysterectomy.  Adnexa unremarkable Other: The small nodules in the precordial space adjacent to the central LEFT hepatic lobe and RIGHT heart are no longer evident. Likewise the nodule in the central mesentery is not clearly identified. No new peritoneal nodularity. No peritoneal thickening. Musculoskeletal: No aggressive osseous lesion. IMPRESSION: 1. Positive response to therapy. 2. Hypodense lesions in liver are smaller or no longer measurable. 3. No clear residual peritoneal metastasis. 4. No ascites. 5. Persistent enhancement of the common bile duct suggest biliary inflammation. Electronically Signed   By: Suzy Bouchard M.D.   On: 02/09/2020 15:00

## 2020-02-10 NOTE — Patient Instructions (Signed)

## 2020-02-10 NOTE — Patient Instructions (Signed)

## 2020-02-10 NOTE — Assessment & Plan Note (Signed)
I have reviewed multiple imaging studies with the patient and family The patient have near complete response to therapy This is at the expense of severe intermittent pancytopenia requiring blood transfusion support We discussed the risk and benefits of continuing combination chemotherapy with carboplatin and gemcitabine versus gemcitabine alone as maintenance treatment Ultimately, the patiernt has made informed decision to proceed with gemcitabine alone in the future Today, due to severe pancytopenia, we will proceed with transfusion support only with 1 unit of blood She is in agreement I recommend 3 more months of single agent gemcitabine before repeating another imaging study, due around November

## 2020-02-10 NOTE — Telephone Encounter (Signed)
Scheduled appts per 8/6 sch msg. Gave pt a print out of AVS.  

## 2020-02-10 NOTE — Assessment & Plan Note (Signed)
We have extensive discussions about goals of care She understood that treatment goal is palliative in nature She understood the role of maintenance treatment

## 2020-02-11 LAB — CA 125: Cancer Antigen (CA) 125: 13.2 U/mL (ref 0.0–38.1)

## 2020-02-13 ENCOUNTER — Telehealth: Payer: Self-pay

## 2020-02-13 LAB — TYPE AND SCREEN
ABO/RH(D): O POS
Antibody Screen: NEGATIVE
Unit division: 0

## 2020-02-13 LAB — BPAM RBC
Blood Product Expiration Date: 202109032359
ISSUE DATE / TIME: 202108061026
Unit Type and Rh: 5100

## 2020-02-13 NOTE — Telephone Encounter (Signed)
-----   Message from Heath Lark, MD sent at 02/13/2020  7:15 AM EDT ----- Regarding: let her know CA-125 is good

## 2020-02-13 NOTE — Telephone Encounter (Signed)
Called and given below message. She verbalized understanding. 

## 2020-02-17 ENCOUNTER — Inpatient Hospital Stay: Payer: Medicare Other

## 2020-02-17 ENCOUNTER — Other Ambulatory Visit: Payer: Self-pay

## 2020-02-17 VITALS — BP 126/66 | HR 85 | Temp 98.3°F | Resp 16 | Ht 67.0 in | Wt 203.5 lb

## 2020-02-17 DIAGNOSIS — C561 Malignant neoplasm of right ovary: Secondary | ICD-10-CM

## 2020-02-17 DIAGNOSIS — Z7189 Other specified counseling: Secondary | ICD-10-CM

## 2020-02-17 DIAGNOSIS — Z5111 Encounter for antineoplastic chemotherapy: Secondary | ICD-10-CM | POA: Diagnosis not present

## 2020-02-17 DIAGNOSIS — T451X5A Adverse effect of antineoplastic and immunosuppressive drugs, initial encounter: Secondary | ICD-10-CM

## 2020-02-17 DIAGNOSIS — D6481 Anemia due to antineoplastic chemotherapy: Secondary | ICD-10-CM

## 2020-02-17 LAB — CBC WITH DIFFERENTIAL (CANCER CENTER ONLY)
Abs Immature Granulocytes: 0.01 10*3/uL (ref 0.00–0.07)
Basophils Absolute: 0 10*3/uL (ref 0.0–0.1)
Basophils Relative: 1 %
Eosinophils Absolute: 0.1 10*3/uL (ref 0.0–0.5)
Eosinophils Relative: 2 %
HCT: 28.1 % — ABNORMAL LOW (ref 36.0–46.0)
Hemoglobin: 9.1 g/dL — ABNORMAL LOW (ref 12.0–15.0)
Immature Granulocytes: 0 %
Lymphocytes Relative: 26 %
Lymphs Abs: 0.7 10*3/uL (ref 0.7–4.0)
MCH: 33.6 pg (ref 26.0–34.0)
MCHC: 32.4 g/dL (ref 30.0–36.0)
MCV: 103.7 fL — ABNORMAL HIGH (ref 80.0–100.0)
Monocytes Absolute: 0.4 10*3/uL (ref 0.1–1.0)
Monocytes Relative: 16 %
Neutro Abs: 1.5 10*3/uL — ABNORMAL LOW (ref 1.7–7.7)
Neutrophils Relative %: 55 %
Platelet Count: 134 10*3/uL — ABNORMAL LOW (ref 150–400)
RBC: 2.71 MIL/uL — ABNORMAL LOW (ref 3.87–5.11)
RDW: 22.4 % — ABNORMAL HIGH (ref 11.5–15.5)
WBC Count: 2.7 10*3/uL — ABNORMAL LOW (ref 4.0–10.5)
nRBC: 0 % (ref 0.0–0.2)

## 2020-02-17 LAB — SAMPLE TO BLOOD BANK

## 2020-02-17 LAB — CMP (CANCER CENTER ONLY)
ALT: 47 U/L — ABNORMAL HIGH (ref 0–44)
AST: 49 U/L — ABNORMAL HIGH (ref 15–41)
Albumin: 3.5 g/dL (ref 3.5–5.0)
Alkaline Phosphatase: 98 U/L (ref 38–126)
Anion gap: 11 (ref 5–15)
BUN: 14 mg/dL (ref 8–23)
CO2: 21 mmol/L — ABNORMAL LOW (ref 22–32)
Calcium: 8.9 mg/dL (ref 8.9–10.3)
Chloride: 108 mmol/L (ref 98–111)
Creatinine: 0.78 mg/dL (ref 0.44–1.00)
GFR, Est AFR Am: >60 mL/min (ref >60)
GFR, Estimated: >60 mL/min (ref >60)
Glucose, Bld: 115 mg/dL — ABNORMAL HIGH (ref 70–99)
Potassium: 3.9 mmol/L (ref 3.5–5.1)
Sodium: 140 mmol/L (ref 135–145)
Total Bilirubin: 0.4 mg/dL (ref 0.3–1.2)
Total Protein: 7.3 g/dL (ref 6.5–8.1)

## 2020-02-17 MED ORDER — SODIUM CHLORIDE 0.9% FLUSH
10.0000 mL | Freq: Once | INTRAVENOUS | Status: AC
  Administered 2020-02-17: 10 mL
  Filled 2020-02-17: qty 10

## 2020-02-17 MED ORDER — PROCHLORPERAZINE MALEATE 10 MG PO TABS
10.0000 mg | ORAL_TABLET | Freq: Once | ORAL | Status: AC
  Administered 2020-02-17: 10 mg via ORAL

## 2020-02-17 MED ORDER — PROCHLORPERAZINE MALEATE 10 MG PO TABS
ORAL_TABLET | ORAL | Status: AC
  Filled 2020-02-17: qty 1

## 2020-02-17 MED ORDER — SODIUM CHLORIDE 0.9% FLUSH
10.0000 mL | INTRAVENOUS | Status: DC | PRN
  Administered 2020-02-17: 10 mL
  Filled 2020-02-17: qty 10

## 2020-02-17 MED ORDER — HEPARIN SOD (PORK) LOCK FLUSH 100 UNIT/ML IV SOLN
500.0000 [IU] | Freq: Once | INTRAVENOUS | Status: AC | PRN
  Administered 2020-02-17: 500 [IU]
  Filled 2020-02-17: qty 5

## 2020-02-17 MED ORDER — SODIUM CHLORIDE 0.9 % IV SOLN
Freq: Once | INTRAVENOUS | Status: AC
  Filled 2020-02-17: qty 250

## 2020-02-17 MED ORDER — SODIUM CHLORIDE 0.9 % IV SOLN
400.0000 mg/m2 | Freq: Once | INTRAVENOUS | Status: AC
  Administered 2020-02-17: 836 mg via INTRAVENOUS
  Filled 2020-02-17: qty 21.99

## 2020-02-17 NOTE — Patient Instructions (Signed)
[facility] Discharge Instructions for Patients Receiving Chemotherapy  Today you received the following chemotherapy agents Gemzar To help prevent nausea and vomiting after your treatment, we encourage you to take your nausea medication as prescribed.  If you develop nausea and vomiting that is not controlled by your nausea medication, call the clinic.   BELOW ARE SYMPTOMS THAT SHOULD BE REPORTED IMMEDIATELY:  *FEVER GREATER THAN 100.5 F  *CHILLS WITH OR WITHOUT FEVER  NAUSEA AND VOMITING THAT IS NOT CONTROLLED WITH YOUR NAUSEA MEDICATION  *UNUSUAL SHORTNESS OF BREATH  *UNUSUAL BRUISING OR BLEEDING  TENDERNESS IN MOUTH AND THROAT WITH OR WITHOUT PRESENCE OF ULCERS  *URINARY PROBLEMS  *BOWEL PROBLEMS  UNUSUAL RASH Items with * indicate a potential emergency and should be followed up as soon as possible.  Feel free to call the clinic should you have any questions or concerns. The clinic phone number is [phone].  Please show the CHEMO ALERT CARD at check-in to the Emergency Department and triage nurse.   

## 2020-02-17 NOTE — Patient Instructions (Signed)

## 2020-03-02 ENCOUNTER — Inpatient Hospital Stay: Payer: Medicare Other

## 2020-03-02 ENCOUNTER — Other Ambulatory Visit: Payer: Self-pay

## 2020-03-02 ENCOUNTER — Inpatient Hospital Stay (HOSPITAL_BASED_OUTPATIENT_CLINIC_OR_DEPARTMENT_OTHER): Payer: Medicare Other | Admitting: Hematology and Oncology

## 2020-03-02 DIAGNOSIS — C561 Malignant neoplasm of right ovary: Secondary | ICD-10-CM

## 2020-03-02 DIAGNOSIS — E119 Type 2 diabetes mellitus without complications: Secondary | ICD-10-CM

## 2020-03-02 DIAGNOSIS — Z7189 Other specified counseling: Secondary | ICD-10-CM | POA: Diagnosis not present

## 2020-03-02 DIAGNOSIS — D61818 Other pancytopenia: Secondary | ICD-10-CM | POA: Diagnosis not present

## 2020-03-02 DIAGNOSIS — D6481 Anemia due to antineoplastic chemotherapy: Secondary | ICD-10-CM

## 2020-03-02 DIAGNOSIS — Z5111 Encounter for antineoplastic chemotherapy: Secondary | ICD-10-CM | POA: Diagnosis not present

## 2020-03-02 LAB — CBC WITH DIFFERENTIAL (CANCER CENTER ONLY)
Abs Immature Granulocytes: 0.05 10*3/uL (ref 0.00–0.07)
Basophils Absolute: 0 10*3/uL (ref 0.0–0.1)
Basophils Relative: 1 %
Eosinophils Absolute: 0 10*3/uL (ref 0.0–0.5)
Eosinophils Relative: 1 %
HCT: 27.4 % — ABNORMAL LOW (ref 36.0–46.0)
Hemoglobin: 8.7 g/dL — ABNORMAL LOW (ref 12.0–15.0)
Immature Granulocytes: 2 %
Lymphocytes Relative: 25 %
Lymphs Abs: 0.8 10*3/uL (ref 0.7–4.0)
MCH: 33.9 pg (ref 26.0–34.0)
MCHC: 31.8 g/dL (ref 30.0–36.0)
MCV: 106.6 fL — ABNORMAL HIGH (ref 80.0–100.0)
Monocytes Absolute: 0.4 10*3/uL (ref 0.1–1.0)
Monocytes Relative: 13 %
Neutro Abs: 2 10*3/uL (ref 1.7–7.7)
Neutrophils Relative %: 58 %
Platelet Count: 174 10*3/uL (ref 150–400)
RBC: 2.57 MIL/uL — ABNORMAL LOW (ref 3.87–5.11)
RDW: 22.5 % — ABNORMAL HIGH (ref 11.5–15.5)
WBC Count: 3.4 10*3/uL — ABNORMAL LOW (ref 4.0–10.5)
nRBC: 0.6 % — ABNORMAL HIGH (ref 0.0–0.2)

## 2020-03-02 LAB — CMP (CANCER CENTER ONLY)
ALT: 33 U/L (ref 0–44)
AST: 38 U/L (ref 15–41)
Albumin: 3.8 g/dL (ref 3.5–5.0)
Alkaline Phosphatase: 82 U/L (ref 38–126)
Anion gap: 12 (ref 5–15)
BUN: 18 mg/dL (ref 8–23)
CO2: 24 mmol/L (ref 22–32)
Calcium: 9.1 mg/dL (ref 8.9–10.3)
Chloride: 103 mmol/L (ref 98–111)
Creatinine: 0.8 mg/dL (ref 0.44–1.00)
GFR, Est AFR Am: >60 mL/min (ref >60)
GFR, Estimated: >60 mL/min (ref >60)
Glucose, Bld: 114 mg/dL — ABNORMAL HIGH (ref 70–99)
Potassium: 3.7 mmol/L (ref 3.5–5.1)
Sodium: 139 mmol/L (ref 135–145)
Total Bilirubin: 0.7 mg/dL (ref 0.3–1.2)
Total Protein: 7.5 g/dL (ref 6.5–8.1)

## 2020-03-02 LAB — SAMPLE TO BLOOD BANK

## 2020-03-02 MED ORDER — SODIUM CHLORIDE 0.9% FLUSH
10.0000 mL | Freq: Once | INTRAVENOUS | Status: AC
  Administered 2020-03-02: 10 mL
  Filled 2020-03-02: qty 10

## 2020-03-02 MED ORDER — SODIUM CHLORIDE 0.9 % IV SOLN
Freq: Once | INTRAVENOUS | Status: AC
  Filled 2020-03-02: qty 250

## 2020-03-02 MED ORDER — PROCHLORPERAZINE MALEATE 10 MG PO TABS
10.0000 mg | ORAL_TABLET | Freq: Once | ORAL | Status: AC
  Administered 2020-03-02: 10 mg via ORAL

## 2020-03-02 MED ORDER — SODIUM CHLORIDE 0.9 % IV SOLN
400.0000 mg/m2 | Freq: Once | INTRAVENOUS | Status: AC
  Administered 2020-03-02: 836 mg via INTRAVENOUS
  Filled 2020-03-02: qty 21.99

## 2020-03-02 MED ORDER — PROCHLORPERAZINE MALEATE 10 MG PO TABS
ORAL_TABLET | ORAL | Status: AC
  Filled 2020-03-02: qty 1

## 2020-03-02 MED ORDER — HEPARIN SOD (PORK) LOCK FLUSH 100 UNIT/ML IV SOLN
500.0000 [IU] | Freq: Once | INTRAVENOUS | Status: AC | PRN
  Administered 2020-03-02: 500 [IU]
  Filled 2020-03-02: qty 5

## 2020-03-02 MED ORDER — PROCHLORPERAZINE MALEATE 10 MG PO TABS: 10.0000 mg | ORAL_TABLET | Freq: Four times a day (QID) | ORAL | 1 refills | Status: DC | PRN

## 2020-03-02 MED ORDER — SODIUM CHLORIDE 0.9% FLUSH
10.0000 mL | INTRAVENOUS | Status: DC | PRN
  Administered 2020-03-02: 10 mL
  Filled 2020-03-02: qty 10

## 2020-03-02 NOTE — Patient Instructions (Signed)
[facility] Discharge Instructions for Patients Receiving Chemotherapy  Today you received the following chemotherapy agents: gemcitabine.  To help prevent nausea and vomiting after your treatment, we encourage you to take your nausea medication as directed.   If you develop nausea and vomiting that is not controlled by your nausea medication, call the clinic.   BELOW ARE SYMPTOMS THAT SHOULD BE REPORTED IMMEDIATELY:  *FEVER GREATER THAN 100.5 F  *CHILLS WITH OR WITHOUT FEVER  NAUSEA AND VOMITING THAT IS NOT CONTROLLED WITH YOUR NAUSEA MEDICATION  *UNUSUAL SHORTNESS OF BREATH  *UNUSUAL BRUISING OR BLEEDING  TENDERNESS IN MOUTH AND THROAT WITH OR WITHOUT PRESENCE OF ULCERS  *URINARY PROBLEMS  *BOWEL PROBLEMS  UNUSUAL RASH Items with * indicate a potential emergency and should be followed up as soon as possible.  Feel free to call the clinic should you have any questions or concerns. The clinic phone number is [phone].  Please show the CHEMO ALERT CARD at check-in to the Emergency Department and triage nurse.   

## 2020-03-02 NOTE — Patient Instructions (Signed)

## 2020-03-03 ENCOUNTER — Encounter: Payer: Self-pay | Admitting: Hematology and Oncology

## 2020-03-03 NOTE — Assessment & Plan Note (Signed)
Her recent imaging studies showed the patient has near complete response to therapy This is at the expense of severe intermittent pancytopenia requiring blood transfusion support With single agent gemzar, her counts are better She does not need transfusion support today  I recommend a few months of single agent gemcitabine before repeating another imaging study, due around November

## 2020-03-03 NOTE — Progress Notes (Signed)
[place] OFFICE PROGRESS NOTE  Patient Care Team: Lilian Coma., MD as PCP - General (Internal Medicine)  ASSESSMENT & PLAN:  Ovarian cancer on right [facility]) Her recent imaging studies showed the patient has near complete response to therapy This is at the expense of severe intermittent pancytopenia requiring blood transfusion support With single agent gemzar, her counts are better She does not need transfusion support today  I recommend a few months of single agent gemcitabine before repeating another imaging study, due around November  Pancytopenia, acquired [facility]) Her counts are stable I have checked serum vitamin B12 and iron studies in February and they are adequate She is not symptomatic and does not need further transfusion today  Type 2 diabetes mellitus without complication ([place]) Her blood sugar is improving with recent dietary modification and weight loss strategies I congratulated her efforts I recommend she continues with dietary modification   No orders of the defined types were placed in this encounter.   All questions were answered. The patient knows to call the clinic with any problems, questions or concerns. The total time spent in the appointment was 20 minutes encounter with patients including review of chart and various tests results, discussions about plan of care and coordination of care plan   Heath Lark, MD 03/03/2020 12:54 PM  INTERVAL HISTORY: Please see below for problem oriented charting. She returns with her husband for follow-up She feels ok The patient denies any recent signs or symptoms of bleeding such as spontaneous epistaxis, hematuria or hematochezia. 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    03/20/2017 Imaging   GYN ultrasound was performed this revealed a small fundus measuring 5 cm there are 2 small fibroids within the fundus with the largest measuring 1.3 cm Within the endometrium there is a small amount a fluid the endometrial wall was slightly thickened at 4.8 mm the ultrasound was consistent with a small endometrial polyp  The right adnexa measured 7.4 x 6.7 cm contained a both a cystic and solid mass with a small amount of shadowing This is consistent with a dermoid tumor the left ovary was 2.6 x 1.5 x 1.0 cm There is no other adnexal masses no free fluid within the abdomen no ascites     04/10/2017 Surgery   Operation: Operative Laparscopy with  Peritoneal biopsy 2 hysteroscopy D&C Surgeon: Lynder Parents MD  Specimens: Peritoneal biopsy and cellular washings  Complications: None FIndings Small amount of ascites was found cellular washings were sent There was peritoneal studding And omental caking Even in steep Trendelenburg the ovaries and uterus were unable to be visualized due to the small intestines in the lower pelvis     04/10/2017 Pathology Results   Biopsy from [facility] was nondiagnostic.  Peritoneum biopsy did not reveal malignancy.  Endometrial biopsy also did not reveal malignancy.   04/13/2017 Tumor Marker   Patient's tumor was tested for the following markers: CA-125 Results of the tumor marker test revealed 324.1   04/20/2017  Tumor Marker   Patient's tumor was tested for the following markers: CA-125 Results of the tumor marker test revealed 345.6   04/22/2017 Pathology Results   Peritoneum, biopsy - CARCINOMA. - SEE COMMENT.   04/22/2017 Procedure    CT-guided core biopsy performed of peritoneal tumor in the left lower quadrant.   04/24/2017 - 09/25/2017 Chemotherapy   She received carboplatin and Taxol x 3 cycles followed by interval surgical debulking on 12/31. Chemotherapy was subsequently resumed for 3 more cycles   06/05/2017 Tumor Marker   Patient's tumor was tested for the following markers: CA-125 Results of the tumor marker test revealed 27.5   06/17/2017 Imaging   1. Considerable improvement in the prior omental caking, which now presents mainly as an indistinct stranding with slight nodularity along the left upper quadrant omentum. 2. Stable appearance of right ovarian mass with fatty and calcific elements classic for a dermoid. 3. There is accentuated enhancement in the walls of the common hepatic duct and common bile duct. A low-grade cholangitis is not readily excluded. 4. Other imaging findings of potential clinical significance: Mild cardiomegaly. Aortic Atherosclerosis (ICD10-I70.0). Lumbar scoliosis, spondylosis, degenerative disc disease, and pars defects at L5. These cause impingement at L5-S1 and lesser impingement at L3-4 and L4-5. 5. 65% compression fracture at L1 slightly worsened than on the prior exam, with associated vertebral sclerosis and posterior retropulsion.   07/06/2017 Surgery   Bilateral Salpingo-Oophorectomy W/Omentectomy, Total Abd Hysterectomy & Radical Dissection For Debulking Right - Ureterolysis, With Or Without Repositioning Of Ureter For Retroperitoneal Fibrosis   07/06/2017 Pathology Results   A: Omentum, omentectomy - Omentum with minimal residual carcinoma, consistent with high grade serous carcinoma (microscopic; stage ypT3a) - Extensive treatment effect with calcifications, fibrosis, histiocytes, and giant cell response - CRS score 3  B: Uterus with cervix and left ovary and fallopian tube, hysterectomy and left salpingo-oophorectomy - Cervix: Ectocervix and endocervix with no dysplasia  or malignancy identified - Endometrium: Inactive with no hyperplasia, atypia, or malignancy identified - Myometrium: Adenomyosis and benign leiomyoma, size 1.2 cm - Serosa: Focal calcification and histiocytes suggestive of treatment effect, with no viable carcinoma identified - Ovary, left: Physiologic changes and no carcinoma identified - Fallopian tube, left: 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 and no malignancy identified  C: Ovary and fallopian tube, right, salpingo-oophorectomy - Right fallopian tube with focal residual carcinoma, consistent with high grade serous carcinoma, and serous tubal intraepithelial carcinoma (STIC) - Associated calcifications and histiocytes suggestive of treatment response - Right ovary with calcifications and histiocytes suggestive of treatment effect, with no definite viable carcinoma identified - Mature cystic teratoma, 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 - See synoptic report and comment   08/14/2017 Tumor Marker   Patient's tumor was tested for the following markers: CA-125 Results of the tumor marker test revealed 14.6   09/04/2017 Tumor Marker   Patient's tumor was tested for the following markers: CA-125 Results of the tumor marker test revealed 12.5   10/03/2017 Genetic Testing   The Common Hereditary Cancer Panel offered by [facility] includes sequencing and/or deletion duplication testing of the following 47 genes: APC, ATM, AXIN2, BARD1, BMPR1A, BRCA1, BRCA2, BRIP1, CDH1, CDKN2A (p14ARF), CDKN2A (p16INK4a), CKD4, CHEK2, CTNNA1, DICER1, EPCAM (Deletion/duplication testing only), GREM1 (promoter region deletion/duplication testing only), KIT, MEN1, MLH1, MSH2, MSH3, MSH6, MUTYH, NBN, NF1, NHTL1, PALB2, PDGFRA, PMS2, POLD1, POLE, PTEN, RAD50, RAD51C, RAD51D, SDHB, SDHC, SDHD, SMAD4, SMARCA4. STK11, TP53, TSC1, TSC2, and VHL.  The following genes were evaluated for sequence changes only: SDHA  and HOXB13 c.251G>A variant only.  Results: Negative, no pathogenic variants  identified.  The date of this test report is 10/03/2017.    10/28/2017 Imaging   1. Stable pulmonary nodules. No new or progressive findings. 2. No residual or recurrent omental disease is identified. No obvious peritoneal surface disease. 3. Increasing soft tissue density and ill-defined interstitial changes around the abdominal wall surgical incision. This may be progressive postoperative changes or granulation tissue but attention on future scans to exclude the possibility of tumor. 4. Status post resection of right adnexal mass. No pelvic mass or pelvic adenopathy. 5. Stable T12 compression fracture.   01/25/2018 Tumor Marker   Patient's tumor was tested for the following markers: CA-125 Results of the tumor marker test revealed 9.2   12/10/2018 Tumor Marker   Patient's tumor was tested for the following markers: CA-125 Results of the tumor marker test revealed 212   12/22/2018 Imaging   1. Interval development of multiple soft tissue lesions in the gastrocolic ligament, mesentery, and along the peritoneal surface. These soft tissue lesions measure up to 6.5 cm in size. Associated interval development of moderate volume ascites in the abdomen and pelvis. Imaging features are consistent with metastatic disease. 2. Soft tissue projects through the rectus sheath in the right paraumbilical region, likely herniated small bowel as there is a large amount of decompressed small bowel does deep to this finding. However, metastatic deposit at this location is also possibility.  3. Trace pneumobilia in the left liver suggests prior sphincterotomy. 4.  Aortic Atherosclerois (ICD10-170.0)   12/30/2018 Procedure   Successful ultrasound-guided therapeutic paracentesis yielding 4.5 liters of peritoneal fluid.   12/30/2018 Procedure   Technically successful right IJ power-injectable port catheter placement. Ready for routine use.   12/31/2018 -  Chemotherapy   The patient had carboplatin and taxol for  chemotherapy treatment.     01/21/2019 Tumor Marker   Patient's tumor was tested for the following markers: CA-125 Results of the tumor marker test revealed 408   02/11/2019 Tumor Marker   Patient's tumor was tested for the following markers: CA-125 Results of the tumor marker test revealed 646.   03/02/2019 Imaging   1. Marked improvement in peritoneal carcinomatosis/ascites, without complete resolution. 2.  Aortic atherosclerosis (ICD10-170.0).     03/25/2019 Tumor Marker   Patient's tumor was tested for the following markers: CA-125 Results of the tumor marker test revealed 24.1.   04/15/2019 Tumor Marker   Patient's tumor was tested for the following markers: CA-125 Results of the tumor marker test revealed 15.3   05/18/2019 Imaging   1. There is progressive, nearly complete resolution of peritoneal and omental nodularity seen on prior examination with very faint residual peritoneal and omental haziness (series 2, image 50, 39).   2. Interval resolution of ascites.   3.  Status post hysterectomy and oophorectomy.   4.  Coronary artery disease.  Aortic Atherosclerosis (ICD10-I70.0).      Tumor Marker   Patient's tumor was tested for the following markers: CA-125 Results of the tumor marker test revealed 11.2.   06/10/2019 Tumor Marker   Patient's tumor was tested for the following markers: CA-125 Results of the tumor marker test revealed 9.1   07/29/2019 Tumor Marker   Patient's tumor was tested for the following markers: CA-125 Results of the tumor marker test revealed 8.8   08/18/2019 Imaging   1. No evidence of new or progressive metastatic disease in the abdomen or pelvis. Stable small nodular soft tissue focus at  the right umbilicus. No new peritoneal implants. No ascites. 2. Chronic findings include: Aortic Atherosclerosis (ICD10-I70.0). Coronary atherosclerosis. Chronic moderate T12 vertebral compression fractures.   09/09/2019 Tumor Marker   Patient's tumor was  tested for the following markers: CA-125 Results of the tumor marker test revealed 11.4.   10/21/2019 Tumor Marker   Patient's tumor was tested for the following markers: CA-125 Results of the tumor marker test revealed 21.5   10/31/2019 Imaging   1. New hepatic and peritoneal metastatic disease. 2.  Aortic atherosclerosis (ICD10-I70.0).     11/11/2019 -  Chemotherapy   The patient had carboplatin and gemzar for chemotherapy treatment.     11/11/2019 Tumor Marker   Patient's tumor was tested for the following markers: CA-125 Results of the tumor marker test revealed 40.2    Genetic Testing   Patient has genetic testing done for PD-L1. Results revealed patient has the following:  PD-L1 staining in tumor cells (TC): <1% PD-L1 staining in tumor-associated immune cells (IC): 0% PD-L1 combined positive score (CPS): <1%   11/11/2019 -  Chemotherapy   The patient had carboplatin and gemzar for chemotherapy treatment.     12/16/2019 Tumor Marker   Patient's tumor was tested for the following markers: CA-125 Results of the tumor marker test revealed 41.7   01/13/2020 Tumor Marker   Patient's tumor was tested for the following markers: CA-125 Results of the tumor marker test revealed 18.9   02/09/2020 Imaging   1. Positive response to therapy. 2. Hypodense lesions in liver are smaller or no longer measurable. 3. No clear residual peritoneal metastasis. 4. No ascites. 5. Persistent enhancement of the common bile duct suggest biliary inflammation.     02/10/2020 Tumor Marker   Patient's tumor was tested for the following markers: CA-125 Results of the tumor marker test revealed 13.2.     REVIEW OF SYSTEMS:   Constitutional: Denies fevers, chills or abnormal weight loss Eyes: Denies blurriness of vision Ears, nose, mouth, throat, and face: Denies mucositis or sore throat Respiratory: Denies cough, dyspnea or wheezes Cardiovascular: Denies palpitation, chest discomfort or lower  extremity swelling Gastrointestinal:  Denies nausea, heartburn or change in bowel habits Skin: Denies abnormal skin rashes Lymphatics: Denies new lymphadenopathy or easy bruising Neurological:Denies numbness, tingling or new weaknesses Behavioral/Psych: Mood is stable, no new changes  All other systems were reviewed with the patient and are negative.  I have reviewed the past medical history, past surgical history, social history and family history with the patient and they are unchanged from previous note.  ALLERGIES:  is allergic to aspirin, nsaids, and tramadol.  MEDICATIONS:  Current Outpatient Medications  Medication Sig Dispense Refill  . acetaminophen (TYLENOL) 650 MG CR tablet Take 1,300 mg by mouth every 8 (eight) hours.     Marland Kitchen alendronate (FOSAMAX) 70 MG tablet Take 70 mg by mouth every Sunday.     Marland Kitchen atorvastatin (LIPITOR) 80 MG tablet Take 40 mg by mouth at bedtime.     . Cholecalciferol (VITAMIN D3) 2000 units TABS Take 2,000 Units by mouth daily.    . citalopram (CELEXA) 10 MG tablet Take 10 mg by mouth at bedtime    . ferrous sulfate 325 (65 FE) MG tablet Take 325 mg by mouth daily. 3 hours after meal and 30 min before a meal    . gabapentin (NEURONTIN) 300 MG capsule Take by mouth.    . Glucosamine Sulfate 500 MG TABS Take 500 mg by mouth 2 (two) times daily.     Marland Kitchen  lidocaine-prilocaine (EMLA) cream Apply to affected area once 30 g 3  . loratadine (CLARITIN) 10 MG tablet Take 10 mg by mouth at bedtime.     Marland Kitchen losartan (COZAAR) 25 MG tablet Take 25 mg by mouth daily.     . meclizine (ANTIVERT) 25 MG tablet Take 25 mg by mouth 2 (two) times daily as needed for dizziness.     . metFORMIN (GLUCOPHAGE) 1000 MG tablet Take 1,000 mg by mouth 2 (two) times daily with a meal.     . omeprazole (PRILOSEC) 40 MG capsule Take 40 mg by mouth daily.     . ondansetron (ZOFRAN) 8 MG tablet Take 1 tablet (8 mg total) by mouth every 8 (eight) hours as needed for refractory nausea / vomiting. 30  tablet 1  . pioglitazone (ACTOS) 15 MG tablet Take 15 mg by mouth at bedtime.     . prochlorperazine (COMPAZINE) 10 MG tablet Take 1 tablet (10 mg total) by mouth every 6 (six) hours as needed (Nausea or vomiting). 60 tablet 1  . Pyridoxine HCl (VITAMIN B-6 PO) Take 50 mg by mouth 1 day or 1 dose.    . vitamin B-12 (CYANOCOBALAMIN) 1000 MCG tablet Take 1,000 mcg by mouth daily.      No current facility-administered medications for this visit.   Facility-Administered Medications Ordered in Other Visits  Medication Dose Route Frequency Provider Last Rate Last Admin  . sodium chloride flush (NS) 0.9 % injection 10 mL  10 mL Intracatheter Once [name], Kenderick Kobler, MD        PHYSICAL EXAMINATION: ECOG PERFORMANCE STATUS: 1 - Symptomatic but completely ambulatory  Vitals:   03/02/20 1126  BP: 115/65  Pulse: 89  Resp: 17  Temp: 98 F (36.7 C)  SpO2: 98%   Filed Weights   03/02/20 1126  Weight: 197 lb 8 oz (89.6 kg)    GENERAL:alert, no distress and comfortable SKIN: skin color, texture, turgor are normal, no rashes or significant lesions EYES: normal, Conjunctiva are pink and non-injected, sclera clear OROPHARYNX:no exudate, no erythema and lips, buccal mucosa, and tongue normal  NECK: supple, thyroid normal size, non-tender, without nodularity LYMPH:  no palpable lymphadenopathy in the cervical, axillary or inguinal LUNGS: clear to auscultation and percussion with normal breathing effort HEART: regular rate & rhythm and no murmurs and no lower extremity edema ABDOMEN:abdomen soft, non-tender and normal bowel sounds Musculoskeletal:no cyanosis of digits and no clubbing  NEURO: alert & oriented x 3 with fluent speech, no focal motor/sensory deficits  LABORATORY DATA:  I have reviewed the data as listed    Component Value Date/Time   NA 139 03/02/2020 1100   NA 139 06/05/2017 0746   K 3.7 03/02/2020 1100   K 4.1 06/05/2017 0746   CL 103 03/02/2020 1100   CO2 24 03/02/2020 1100    CO2 21 (L) 06/05/2017 0746   GLUCOSE 114 (H) 03/02/2020 1100   GLUCOSE 279 (H) 06/05/2017 0746   BUN 18 03/02/2020 1100   BUN 15.0 06/05/2017 0746   CREATININE 0.80 03/02/2020 1100   CREATININE 0.9 06/05/2017 0746   CALCIUM 9.1 03/02/2020 1100   CALCIUM 9.1 01/13/2020 0735   CALCIUM 10.1 06/05/2017 0746   PROT 7.5 03/02/2020 1100   PROT 8.1 06/05/2017 0746   ALBUMIN 3.8 03/02/2020 1100   ALBUMIN 4.0 06/05/2017 0746   AST 38 03/02/2020 1100   AST 22 06/05/2017 0746   ALT 33 03/02/2020 1100   ALT 36 06/05/2017 0746   ALKPHOS 82 03/02/2020  1100   ALKPHOS 98 06/05/2017 0746   BILITOT 0.7 03/02/2020 1100   BILITOT 0.44 06/05/2017 0746   GFRNONAA >60 03/02/2020 1100   GFRAA >60 03/02/2020 1100    No results found for: SPEP, UPEP  Lab Results  Component Value Date   WBC 3.4 (L) 03/02/2020   NEUTROABS 2.0 03/02/2020   HGB 8.7 (L) 03/02/2020   HCT 27.4 (L) 03/02/2020   MCV 106.6 (H) 03/02/2020   PLT 174 03/02/2020      Chemistry      Component Value Date/Time   NA 139 03/02/2020 1100   NA 139 06/05/2017 0746   K 3.7 03/02/2020 1100   K 4.1 06/05/2017 0746   CL 103 03/02/2020 1100   CO2 24 03/02/2020 1100   CO2 21 (L) 06/05/2017 0746   BUN 18 03/02/2020 1100   BUN 15.0 06/05/2017 0746   CREATININE 0.80 03/02/2020 1100   CREATININE 0.9 06/05/2017 0746      Component Value Date/Time   CALCIUM 9.1 03/02/2020 1100   CALCIUM 9.1 01/13/2020 0735   CALCIUM 10.1 06/05/2017 0746   ALKPHOS 82 03/02/2020 1100   ALKPHOS 98 06/05/2017 0746   AST 38 03/02/2020 1100   AST 22 06/05/2017 0746   ALT 33 03/02/2020 1100   ALT 36 06/05/2017 0746   BILITOT 0.7 03/02/2020 1100   BILITOT 0.44 06/05/2017 0746

## 2020-03-03 NOTE — Assessment & Plan Note (Signed)
Her blood sugar is improving with recent dietary modification and weight loss strategies I congratulated her efforts I recommend she continues with dietary modification

## 2020-03-03 NOTE — Assessment & Plan Note (Signed)
Her counts are stable I have checked serum vitamin B12 and iron studies in February and they are adequate She is not symptomatic and does not need further transfusion today 

## 2020-03-19 ENCOUNTER — Other Ambulatory Visit: Payer: Self-pay

## 2020-03-19 ENCOUNTER — Inpatient Hospital Stay: Payer: Medicare Other | Attending: Gynecology

## 2020-03-19 ENCOUNTER — Inpatient Hospital Stay: Payer: Medicare Other

## 2020-03-19 VITALS — BP 131/65 | HR 88 | Temp 98.4°F | Resp 18 | Wt 198.8 lb

## 2020-03-19 DIAGNOSIS — R110 Nausea: Secondary | ICD-10-CM | POA: Diagnosis not present

## 2020-03-19 DIAGNOSIS — D61818 Other pancytopenia: Secondary | ICD-10-CM | POA: Insufficient documentation

## 2020-03-19 DIAGNOSIS — C561 Malignant neoplasm of right ovary: Secondary | ICD-10-CM | POA: Diagnosis present

## 2020-03-19 DIAGNOSIS — Z5111 Encounter for antineoplastic chemotherapy: Secondary | ICD-10-CM | POA: Insufficient documentation

## 2020-03-19 DIAGNOSIS — Z7189 Other specified counseling: Secondary | ICD-10-CM

## 2020-03-19 DIAGNOSIS — T451X5A Adverse effect of antineoplastic and immunosuppressive drugs, initial encounter: Secondary | ICD-10-CM

## 2020-03-19 DIAGNOSIS — Z23 Encounter for immunization: Secondary | ICD-10-CM | POA: Diagnosis not present

## 2020-03-19 DIAGNOSIS — D6481 Anemia due to antineoplastic chemotherapy: Secondary | ICD-10-CM | POA: Insufficient documentation

## 2020-03-19 LAB — CMP (CANCER CENTER ONLY)
ALT: 28 U/L (ref 0–44)
AST: 35 U/L (ref 15–41)
Albumin: 3.5 g/dL (ref 3.5–5.0)
Alkaline Phosphatase: 76 U/L (ref 38–126)
Anion gap: 10 (ref 5–15)
BUN: 14 mg/dL (ref 8–23)
CO2: 26 mmol/L (ref 22–32)
Calcium: 8.9 mg/dL (ref 8.9–10.3)
Chloride: 103 mmol/L (ref 98–111)
Creatinine: 0.79 mg/dL (ref 0.44–1.00)
GFR, Est AFR Am: >60 mL/min (ref >60)
GFR, Estimated: >60 mL/min (ref >60)
Glucose, Bld: 113 mg/dL — ABNORMAL HIGH (ref 70–99)
Potassium: 4.1 mmol/L (ref 3.5–5.1)
Sodium: 139 mmol/L (ref 135–145)
Total Bilirubin: 0.5 mg/dL (ref 0.3–1.2)
Total Protein: 7.6 g/dL (ref 6.5–8.1)

## 2020-03-19 LAB — CBC WITH DIFFERENTIAL (CANCER CENTER ONLY)
Abs Immature Granulocytes: 0.02 10*3/uL (ref 0.00–0.07)
Basophils Absolute: 0 10*3/uL (ref 0.0–0.1)
Basophils Relative: 1 %
Eosinophils Absolute: 0.1 10*3/uL (ref 0.0–0.5)
Eosinophils Relative: 2 %
HCT: 28 % — ABNORMAL LOW (ref 36.0–46.0)
Hemoglobin: 8.8 g/dL — ABNORMAL LOW (ref 12.0–15.0)
Immature Granulocytes: 0 %
Lymphocytes Relative: 17 %
Lymphs Abs: 0.9 10*3/uL (ref 0.7–4.0)
MCH: 35.2 pg — ABNORMAL HIGH (ref 26.0–34.0)
MCHC: 31.4 g/dL (ref 30.0–36.0)
MCV: 112 fL — ABNORMAL HIGH (ref 80.0–100.0)
Monocytes Absolute: 0.5 10*3/uL (ref 0.1–1.0)
Monocytes Relative: 10 %
Neutro Abs: 3.8 10*3/uL (ref 1.7–7.7)
Neutrophils Relative %: 70 %
Platelet Count: 249 10*3/uL (ref 150–400)
RBC: 2.5 MIL/uL — ABNORMAL LOW (ref 3.87–5.11)
RDW: 21.8 % — ABNORMAL HIGH (ref 11.5–15.5)
WBC Count: 5.3 10*3/uL (ref 4.0–10.5)
nRBC: 0 % (ref 0.0–0.2)

## 2020-03-19 LAB — SAMPLE TO BLOOD BANK

## 2020-03-19 MED ORDER — SODIUM CHLORIDE 0.9 % IV SOLN
400.0000 mg/m2 | Freq: Once | INTRAVENOUS | Status: AC
  Administered 2020-03-19: 836 mg via INTRAVENOUS
  Filled 2020-03-19: qty 21.99

## 2020-03-19 MED ORDER — SODIUM CHLORIDE 0.9% FLUSH
10.0000 mL | INTRAVENOUS | Status: DC | PRN
  Administered 2020-03-19: 10 mL
  Filled 2020-03-19: qty 10

## 2020-03-19 MED ORDER — SODIUM CHLORIDE 0.9% FLUSH
10.0000 mL | Freq: Once | INTRAVENOUS | Status: AC
  Administered 2020-03-19: 10 mL
  Filled 2020-03-19: qty 10

## 2020-03-19 MED ORDER — PROCHLORPERAZINE MALEATE 10 MG PO TABS
ORAL_TABLET | ORAL | Status: AC
  Filled 2020-03-19: qty 1

## 2020-03-19 MED ORDER — HEPARIN SOD (PORK) LOCK FLUSH 100 UNIT/ML IV SOLN
500.0000 [IU] | Freq: Once | INTRAVENOUS | Status: AC | PRN
  Administered 2020-03-19: 500 [IU]
  Filled 2020-03-19: qty 5

## 2020-03-19 MED ORDER — PROCHLORPERAZINE MALEATE 10 MG PO TABS
10.0000 mg | ORAL_TABLET | Freq: Once | ORAL | Status: AC
  Administered 2020-03-19: 10 mg via ORAL

## 2020-03-19 MED ORDER — SODIUM CHLORIDE 0.9 % IV SOLN
Freq: Once | INTRAVENOUS | Status: AC
  Filled 2020-03-19: qty 250

## 2020-03-19 NOTE — Patient Instructions (Signed)

## 2020-03-19 NOTE — Patient Instructions (Signed)
[facility] Discharge Instructions for Patients Receiving Chemotherapy  Today you received the following chemotherapy agents: gemcitabine.  To help prevent nausea and vomiting after your treatment, we encourage you to take your nausea medication as directed.   If you develop nausea and vomiting that is not controlled by your nausea medication, call the clinic.   BELOW ARE SYMPTOMS THAT SHOULD BE REPORTED IMMEDIATELY:  *FEVER GREATER THAN 100.5 F  *CHILLS WITH OR WITHOUT FEVER  NAUSEA AND VOMITING THAT IS NOT CONTROLLED WITH YOUR NAUSEA MEDICATION  *UNUSUAL SHORTNESS OF BREATH  *UNUSUAL BRUISING OR BLEEDING  TENDERNESS IN MOUTH AND THROAT WITH OR WITHOUT PRESENCE OF ULCERS  *URINARY PROBLEMS  *BOWEL PROBLEMS  UNUSUAL RASH Items with * indicate a potential emergency and should be followed up as soon as possible.  Feel free to call the clinic should you have any questions or concerns. The clinic phone number is [phone].  Please show the CHEMO ALERT CARD at check-in to the Emergency Department and triage nurse.   

## 2020-03-20 LAB — CA 125: Cancer Antigen (CA) 125: 15.5 U/mL (ref 0.0–38.1)

## 2020-03-30 ENCOUNTER — Inpatient Hospital Stay: Payer: Medicare Other

## 2020-03-30 ENCOUNTER — Other Ambulatory Visit: Payer: Self-pay

## 2020-03-30 ENCOUNTER — Encounter: Payer: Self-pay | Admitting: Hematology and Oncology

## 2020-03-30 ENCOUNTER — Inpatient Hospital Stay (HOSPITAL_BASED_OUTPATIENT_CLINIC_OR_DEPARTMENT_OTHER): Payer: Medicare Other | Admitting: Hematology and Oncology

## 2020-03-30 DIAGNOSIS — D6481 Anemia due to antineoplastic chemotherapy: Secondary | ICD-10-CM | POA: Diagnosis not present

## 2020-03-30 DIAGNOSIS — Z7189 Other specified counseling: Secondary | ICD-10-CM

## 2020-03-30 DIAGNOSIS — C561 Malignant neoplasm of right ovary: Secondary | ICD-10-CM

## 2020-03-30 DIAGNOSIS — Z0000 Encounter for general adult medical examination without abnormal findings: Secondary | ICD-10-CM

## 2020-03-30 DIAGNOSIS — Z5111 Encounter for antineoplastic chemotherapy: Secondary | ICD-10-CM | POA: Diagnosis not present

## 2020-03-30 DIAGNOSIS — R110 Nausea: Secondary | ICD-10-CM | POA: Diagnosis not present

## 2020-03-30 DIAGNOSIS — T451X5A Adverse effect of antineoplastic and immunosuppressive drugs, initial encounter: Secondary | ICD-10-CM

## 2020-03-30 LAB — CMP (CANCER CENTER ONLY)
ALT: 45 U/L — ABNORMAL HIGH (ref 0–44)
AST: 48 U/L — ABNORMAL HIGH (ref 15–41)
Albumin: 3.3 g/dL — ABNORMAL LOW (ref 3.5–5.0)
Alkaline Phosphatase: 102 U/L (ref 38–126)
Anion gap: 8 (ref 5–15)
BUN: 17 mg/dL (ref 8–23)
CO2: 27 mmol/L (ref 22–32)
Calcium: 9 mg/dL (ref 8.9–10.3)
Chloride: 104 mmol/L (ref 98–111)
Creatinine: 0.78 mg/dL (ref 0.44–1.00)
GFR, Est AFR Am: >60 mL/min (ref >60)
GFR, Estimated: >60 mL/min (ref >60)
Glucose, Bld: 114 mg/dL — ABNORMAL HIGH (ref 70–99)
Potassium: 3.9 mmol/L (ref 3.5–5.1)
Sodium: 139 mmol/L (ref 135–145)
Total Bilirubin: 0.4 mg/dL (ref 0.3–1.2)
Total Protein: 7.3 g/dL (ref 6.5–8.1)

## 2020-03-30 LAB — CBC WITH DIFFERENTIAL (CANCER CENTER ONLY)
Abs Immature Granulocytes: 0.1 10*3/uL — ABNORMAL HIGH (ref 0.00–0.07)
Basophils Absolute: 0 10*3/uL (ref 0.0–0.1)
Basophils Relative: 1 %
Eosinophils Absolute: 0.1 10*3/uL (ref 0.0–0.5)
Eosinophils Relative: 2 %
HCT: 26.5 % — ABNORMAL LOW (ref 36.0–46.0)
Hemoglobin: 8.5 g/dL — ABNORMAL LOW (ref 12.0–15.0)
Immature Granulocytes: 2 %
Lymphocytes Relative: 24 %
Lymphs Abs: 1 10*3/uL (ref 0.7–4.0)
MCH: 35.6 pg — ABNORMAL HIGH (ref 26.0–34.0)
MCHC: 32.1 g/dL (ref 30.0–36.0)
MCV: 110.9 fL — ABNORMAL HIGH (ref 80.0–100.0)
Monocytes Absolute: 0.6 10*3/uL (ref 0.1–1.0)
Monocytes Relative: 14 %
Neutro Abs: 2.3 10*3/uL (ref 1.7–7.7)
Neutrophils Relative %: 57 %
Platelet Count: 178 10*3/uL (ref 150–400)
RBC: 2.39 MIL/uL — ABNORMAL LOW (ref 3.87–5.11)
RDW: 20.8 % — ABNORMAL HIGH (ref 11.5–15.5)
WBC Count: 4.1 10*3/uL (ref 4.0–10.5)
nRBC: 0 % (ref 0.0–0.2)

## 2020-03-30 LAB — SAMPLE TO BLOOD BANK

## 2020-03-30 MED ORDER — SODIUM CHLORIDE 0.9% FLUSH
10.0000 mL | INTRAVENOUS | Status: DC | PRN
  Administered 2020-03-30: 10 mL
  Filled 2020-03-30: qty 10

## 2020-03-30 MED ORDER — SODIUM CHLORIDE 0.9% FLUSH
10.0000 mL | Freq: Once | INTRAVENOUS | Status: AC
  Administered 2020-03-30: 10 mL
  Filled 2020-03-30: qty 10

## 2020-03-30 MED ORDER — HEPARIN SOD (PORK) LOCK FLUSH 100 UNIT/ML IV SOLN
500.0000 [IU] | Freq: Once | INTRAVENOUS | Status: AC | PRN
  Administered 2020-03-30: 500 [IU]
  Filled 2020-03-30: qty 5

## 2020-03-30 MED ORDER — SODIUM CHLORIDE 0.9 % IV SOLN
Freq: Once | INTRAVENOUS | Status: AC
  Filled 2020-03-30: qty 250

## 2020-03-30 MED ORDER — PROCHLORPERAZINE MALEATE 10 MG PO TABS
ORAL_TABLET | ORAL | Status: AC
  Filled 2020-03-30: qty 1

## 2020-03-30 MED ORDER — PROCHLORPERAZINE MALEATE 10 MG PO TABS
10.0000 mg | ORAL_TABLET | Freq: Once | ORAL | Status: AC
  Administered 2020-03-30: 10 mg via ORAL

## 2020-03-30 MED ORDER — INFLUENZA VAC A&B SA ADJ QUAD 0.5 ML IM PRSY
0.5000 mL | PREFILLED_SYRINGE | Freq: Once | INTRAMUSCULAR | Status: AC
  Administered 2020-03-30: 0.5 mL via INTRAMUSCULAR

## 2020-03-30 MED ORDER — INFLUENZA VAC A&B SA ADJ QUAD 0.5 ML IM PRSY
PREFILLED_SYRINGE | INTRAMUSCULAR | Status: AC
  Filled 2020-03-30: qty 0.5

## 2020-03-30 MED ORDER — SODIUM CHLORIDE 0.9 % IV SOLN
400.0000 mg/m2 | Freq: Once | INTRAVENOUS | Status: AC
  Administered 2020-03-30: 836 mg via INTRAVENOUS
  Filled 2020-03-30: qty 21.99

## 2020-03-30 NOTE — Progress Notes (Signed)
[place] OFFICE PROGRESS NOTE  Patient Care Team: Lilian Coma., MD as PCP - General (Internal Medicine)  ASSESSMENT & PLAN:  Ovarian cancer on right [facility]) Her recent imaging studies showed the patient has near complete response to therapy This is at the expense of severe intermittent pancytopenia requiring blood transfusion support With single agent gemzar, her counts are better She does not need transfusion support today  I recommend a few months of single agent gemcitabine before repeating another imaging study, due around November  Anemia due to antineoplastic chemotherapy This is likely due to recent treatment. The patient denies recent history of bleeding such as epistaxis, hematuria or hematochezia. She is asymptomatic from the anemia. I will observe for now.  She does not require transfusion now. I will continue the chemotherapy at current dose without dosage adjustment.  If the anemia gets progressive worse in the future, I might have to delay her treatment or adjust the chemotherapy dose.   Mild nausea She has mild nausea usually around the third day of chemotherapy This also coincided with the day she take Fosamax I recommend she hold Fosamax for few weeks just in case her nausea sensation could be due to gastritis She is also instructed to take antiemetics as needed   No orders of the defined types were placed in this encounter.   All questions were answered. The patient knows to call the clinic with any problems, questions or concerns. The total time spent in the appointment was 20 minutes encounter with patients including review of chart and various tests results, discussions about plan of care and coordination of care plan   Heath Lark, MD 03/30/2020 9:23 AM  INTERVAL HISTORY: Please see below for problem oriented charting. 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    03/20/2017 Imaging   GYN ultrasound was performed this revealed a small fundus measuring 5 cm there are 2 small fibroids within the fundus with the largest measuring 1.3 cm Within the endometrium there is a small amount a fluid the endometrial wall was slightly thickened at 4.8 mm the ultrasound was consistent with a small endometrial polyp  The right adnexa measured 7.4 x 6.7 cm contained a both a cystic and solid mass with a small amount of shadowing This is consistent with a dermoid tumor the left ovary was 2.6 x 1.5 x 1.0 cm There is no other adnexal masses no free fluid within the abdomen no ascites     04/10/2017 Surgery   Operation: Operative Laparscopy with  Peritoneal biopsy 2 hysteroscopy D&C Surgeon: Lynder Parents MD  Specimens: Peritoneal biopsy and cellular washings  Complications: None FIndings Small amount of ascites was found cellular washings were sent There was peritoneal studding And omental caking Even in steep Trendelenburg the ovaries and uterus were unable to  be visualized due to the small intestines in the lower pelvis     04/10/2017 Pathology Results   Biopsy from [facility] was nondiagnostic.  Peritoneum biopsy did not reveal malignancy.  Endometrial biopsy also did not reveal malignancy.   04/13/2017 Tumor Marker   Patient's tumor  was tested for the following markers: CA-125 Results of the tumor marker test revealed 324.1   04/20/2017 Tumor Marker   Patient's tumor was tested for the following markers: CA-125 Results of the tumor marker test revealed 345.6   04/22/2017 Pathology Results   Peritoneum, biopsy - CARCINOMA. - SEE COMMENT.   04/22/2017 Procedure   CT-guided core biopsy performed of peritoneal tumor in the left lower quadrant.   04/24/2017 - 09/25/2017 Chemotherapy   She received carboplatin and Taxol x 3 cycles followed by interval surgical debulking on 12/31. Chemotherapy was subsequently resumed for 3 more cycles   06/05/2017 Tumor Marker   Patient's tumor was tested for the following markers: CA-125 Results of the tumor marker test revealed 27.5   06/17/2017 Imaging   1. Considerable improvement in the prior omental caking, which now presents mainly as an indistinct stranding with slight nodularity along the left upper quadrant omentum. 2. Stable appearance of right ovarian mass with fatty and calcific elements classic for a dermoid. 3. There is accentuated enhancement in the walls of the common hepatic duct and common bile duct. A low-grade cholangitis is not readily excluded. 4. Other imaging findings of potential clinical significance: Mild cardiomegaly. Aortic Atherosclerosis (ICD10-I70.0). Lumbar scoliosis, spondylosis, degenerative disc disease, and pars defects at L5. These cause impingement at L5-S1 and lesser impingement at L3-4 and L4-5. 5. 65% compression fracture at L1 slightly worsened than on the prior exam, with associated vertebral sclerosis and posterior retropulsion.   07/06/2017 Surgery   Bilateral Salpingo-Oophorectomy W/Omentectomy, Total Abd Hysterectomy & Radical Dissection For Debulking Right - Ureterolysis, With Or Without Repositioning Of Ureter For Retroperitoneal Fibrosis   07/06/2017 Pathology Results   A: Omentum, omentectomy - Omentum with minimal residual  carcinoma, consistent with high grade serous carcinoma (microscopic; stage ypT3a) - Extensive treatment effect with calcifications, fibrosis, histiocytes, and giant cell response - CRS score 3  B: Uterus with cervix and left ovary and fallopian tube, hysterectomy and left salpingo-oophorectomy - Cervix: Ectocervix and endocervix with no dysplasia or malignancy identified - Endometrium: Inactive with no hyperplasia, atypia, or malignancy identified - Myometrium: Adenomyosis and benign leiomyoma, size 1.2 cm - Serosa: Focal calcification and histiocytes suggestive of treatment effect, with no viable carcinoma identified - Ovary, left: Physiologic changes and no carcinoma identified - Fallopian tube, left: 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 and no malignancy identified  C: Ovary and fallopian tube, right, salpingo-oophorectomy - Right fallopian tube with focal residual carcinoma, consistent with high grade serous carcinoma, and serous tubal intraepithelial carcinoma (STIC) - Associated calcifications and histiocytes suggestive of treatment response - Right ovary with calcifications and histiocytes suggestive of treatment effect, with no definite viable carcinoma identified - Mature cystic teratoma, 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 - See synoptic report and comment   08/14/2017 Tumor Marker   Patient's tumor was tested for the following markers: CA-125 Results of the tumor marker test revealed 14.6   09/04/2017 Tumor Marker   Patient's tumor was tested for the following markers: CA-125 Results of the tumor marker test revealed 12.5   10/03/2017 Genetic Testing   The Common Hereditary Cancer Panel offered by [facility] includes sequencing and/or deletion duplication testing of the  following 47 genes: APC, ATM, AXIN2, BARD1, BMPR1A, BRCA1, BRCA2, BRIP1, CDH1, CDKN2A (p14ARF), CDKN2A (p16INK4a), CKD4, CHEK2, CTNNA1, DICER1, EPCAM (Deletion/duplication testing only), GREM1 (promoter region deletion/duplication  testing only), KIT, MEN1, MLH1, MSH2, MSH3, MSH6, MUTYH, NBN, NF1, NHTL1, PALB2, PDGFRA, PMS2, POLD1, POLE, PTEN, RAD50, RAD51C, RAD51D, SDHB, SDHC, SDHD, SMAD4, SMARCA4. STK11, TP53, TSC1, TSC2, and VHL.  The following genes were evaluated for sequence changes only: SDHA and HOXB13 c.251G>A variant only.  Results: Negative, no pathogenic variants identified.  The date of this test report is 10/03/2017.    10/28/2017 Imaging   1. Stable pulmonary nodules. No new or progressive findings. 2. No residual or recurrent omental disease is identified. No obvious peritoneal surface disease. 3. Increasing soft tissue density and ill-defined interstitial changes around the abdominal wall surgical incision. This may be progressive postoperative changes or granulation tissue but attention on future scans to exclude the possibility of tumor. 4. Status post resection of right adnexal mass. No pelvic mass or pelvic adenopathy. 5. Stable T12 compression fracture.   01/25/2018 Tumor Marker   Patient's tumor was tested for the following markers: CA-125 Results of the tumor marker test revealed 9.2   12/10/2018 Tumor Marker   Patient's tumor was tested for the following markers: CA-125 Results of the tumor marker test revealed 212   12/22/2018 Imaging   1. Interval development of multiple soft tissue lesions in the gastrocolic ligament, mesentery, and along the peritoneal surface. These soft tissue lesions measure up to 6.5 cm in size. Associated interval development of moderate volume ascites in the abdomen and pelvis. Imaging features are consistent with metastatic disease. 2. Soft tissue projects through the rectus sheath in the right paraumbilical region, likely herniated small bowel as there is a large amount of decompressed small bowel does deep to this finding. However, metastatic deposit at this location is also possibility.  3. Trace pneumobilia in the left liver suggests prior sphincterotomy. 4.  Aortic  Atherosclerois (ICD10-170.0)   12/30/2018 Procedure   Successful ultrasound-guided therapeutic paracentesis yielding 4.5 liters of peritoneal fluid.   12/30/2018 Procedure   Technically successful right IJ power-injectable port catheter placement. Ready for routine use.   12/31/2018 -  Chemotherapy   The patient had carboplatin and taxol for chemotherapy treatment.     01/21/2019 Tumor Marker   Patient's tumor was tested for the following markers: CA-125 Results of the tumor marker test revealed 408   02/11/2019 Tumor Marker   Patient's tumor was tested for the following markers: CA-125 Results of the tumor marker test revealed 646.   03/02/2019 Imaging   1. Marked improvement in peritoneal carcinomatosis/ascites, without complete resolution. 2.  Aortic atherosclerosis (ICD10-170.0).     03/25/2019 Tumor Marker   Patient's tumor was tested for the following markers: CA-125 Results of the tumor marker test revealed 24.1.   04/15/2019 Tumor Marker   Patient's tumor was tested for the following markers: CA-125 Results of the tumor marker test revealed 15.3   05/18/2019 Imaging   1. There is progressive, nearly complete resolution of peritoneal and omental nodularity seen on prior examination with very faint residual peritoneal and omental haziness (series 2, image 50, 39).   2. Interval resolution of ascites.   3.  Status post hysterectomy and oophorectomy.   4.  Coronary artery disease.  Aortic Atherosclerosis (ICD10-I70.0).      Tumor Marker   Patient's tumor was tested for the following markers: CA-125 Results of the tumor marker test revealed 11.2.   06/10/2019 Tumor  Marker   Patient's tumor was tested for the following markers: CA-125 Results of the tumor marker test revealed 9.1   07/29/2019 Tumor Marker   Patient's tumor was tested for the following markers: CA-125 Results of the tumor marker test revealed 8.8   08/18/2019 Imaging   1. No evidence of new or progressive  metastatic disease in the abdomen or pelvis. Stable small nodular soft tissue focus at the right umbilicus. No new peritoneal implants. No ascites. 2. Chronic findings include: Aortic Atherosclerosis (ICD10-I70.0). Coronary atherosclerosis. Chronic moderate T12 vertebral compression fractures.   09/09/2019 Tumor Marker   Patient's tumor was tested for the following markers: CA-125 Results of the tumor marker test revealed 11.4.   10/21/2019 Tumor Marker   Patient's tumor was tested for the following markers: CA-125 Results of the tumor marker test revealed 21.5   10/31/2019 Imaging   1. New hepatic and peritoneal metastatic disease. 2.  Aortic atherosclerosis (ICD10-I70.0).     11/11/2019 -  Chemotherapy   The patient had carboplatin and gemzar for chemotherapy treatment.     11/11/2019 Tumor Marker   Patient's tumor was tested for the following markers: CA-125 Results of the tumor marker test revealed 40.2    Genetic Testing   Patient has genetic testing done for PD-L1. Results revealed patient has the following:  PD-L1 staining in tumor cells (TC): <1% PD-L1 staining in tumor-associated immune cells (IC): 0% PD-L1 combined positive score (CPS): <1%   11/11/2019 -  Chemotherapy   The patient had carboplatin and gemzar for chemotherapy treatment.     12/16/2019 Tumor Marker   Patient's tumor was tested for the following markers: CA-125 Results of the tumor marker test revealed 41.7   01/13/2020 Tumor Marker   Patient's tumor was tested for the following markers: CA-125 Results of the tumor marker test revealed 18.9   02/09/2020 Imaging   1. Positive response to therapy. 2. Hypodense lesions in liver are smaller or no longer measurable. 3. No clear residual peritoneal metastasis. 4. No ascites. 5. Persistent enhancement of the common bile duct suggest biliary inflammation.     02/10/2020 Tumor Marker   Patient's tumor was tested for the following markers: CA-125 Results of the  tumor marker test revealed 13.2.   03/19/2020 Tumor Marker   Patient's tumor was tested for the following markers: CA-125 Results of the tumor marker test revealed 15.5     REVIEW OF SYSTEMS:   Constitutional: Denies fevers, chills or abnormal weight loss Eyes: Denies blurriness of vision Ears, nose, mouth, throat, and face: Denies mucositis or sore throat Respiratory: Denies cough, dyspnea or wheezes Cardiovascular: Denies palpitation, chest discomfort or lower extremity swelling Gastrointestinal:  Denies nausea, heartburn or change in bowel habits Skin: Denies abnormal skin rashes Lymphatics: Denies new lymphadenopathy or easy bruising Neurological:Denies numbness, tingling or new weaknesses Behavioral/Psych: Mood is stable, no new changes  All other systems were reviewed with the patient and are negative.  I have reviewed the past medical history, past surgical history, social history and family history with the patient and they are unchanged from previous note.  ALLERGIES:  is allergic to aspirin, nsaids, and tramadol.  MEDICATIONS:  Current Outpatient Medications  Medication Sig Dispense Refill  . acetaminophen (TYLENOL) 650 MG CR tablet Take 1,300 mg by mouth every 8 (eight) hours.     Marland Kitchen alendronate (FOSAMAX) 70 MG tablet Take 70 mg by mouth every Sunday.     Marland Kitchen atorvastatin (LIPITOR) 80 MG tablet Take 40 mg by mouth  at bedtime.     . Cholecalciferol (VITAMIN D3) 2000 units TABS Take 2,000 Units by mouth daily.    . citalopram (CELEXA) 10 MG tablet Take 10 mg by mouth at bedtime    . ferrous sulfate 325 (65 FE) MG tablet Take 325 mg by mouth daily. 3 hours after meal and 30 min before a meal    . gabapentin (NEURONTIN) 300 MG capsule Take by mouth.    . Glucosamine Sulfate 500 MG TABS Take 500 mg by mouth 2 (two) times daily.     Marland Kitchen lidocaine-prilocaine (EMLA) cream Apply to affected area once 30 g 3  . loratadine (CLARITIN) 10 MG tablet Take 10 mg by mouth at bedtime.     Marland Kitchen  losartan (COZAAR) 25 MG tablet Take 25 mg by mouth daily.     . meclizine (ANTIVERT) 25 MG tablet Take 25 mg by mouth 2 (two) times daily as needed for dizziness.     . metFORMIN (GLUCOPHAGE) 1000 MG tablet Take 1,000 mg by mouth 2 (two) times daily with a meal.     . omeprazole (PRILOSEC) 40 MG capsule Take 40 mg by mouth daily.     . ondansetron (ZOFRAN) 8 MG tablet Take 1 tablet (8 mg total) by mouth every 8 (eight) hours as needed for refractory nausea / vomiting. 30 tablet 1  . pioglitazone (ACTOS) 15 MG tablet Take 15 mg by mouth at bedtime.     . prochlorperazine (COMPAZINE) 10 MG tablet Take 1 tablet (10 mg total) by mouth every 6 (six) hours as needed (Nausea or vomiting). 60 tablet 1  . Pyridoxine HCl (VITAMIN B-6 PO) Take 50 mg by mouth 1 day or 1 dose.    . vitamin B-12 (CYANOCOBALAMIN) 1000 MCG tablet Take 1,000 mcg by mouth daily.      No current facility-administered medications for this visit.   Facility-Administered Medications Ordered in Other Visits  Medication Dose Route Frequency Provider Last Rate Last Admin  . gemcitabine (GEMZAR) 836 mg in sodium chloride 0.9 % 250 mL chemo infusion  400 mg/m2 (Treatment Plan Recorded) Intravenous Once [name], Marveline Profeta, MD      . heparin lock flush 100 unit/mL  500 Units Intracatheter Once PRN [name], Zhavia Cunanan, MD      . sodium chloride flush (NS) 0.9 % injection 10 mL  10 mL Intracatheter Once [name], Conley Delisle, MD      . sodium chloride flush (NS) 0.9 % injection 10 mL  10 mL Intracatheter PRN [name], Petrea Fredenburg, MD        PHYSICAL EXAMINATION: ECOG PERFORMANCE STATUS: 2 - Symptomatic, <50% confined to bed  Vitals:   03/30/20 0826  BP: 123/65  Pulse: 90  Resp: 18  Temp: 97.7 F (36.5 C)  SpO2: 98%   Filed Weights   03/30/20 0826  Weight: 196 lb 9.6 oz (89.2 kg)    GENERAL:alert, no distress and comfortable.  She looks pale SKIN: skin color, texture, turgor are normal, no rashes or significant lesions EYES: normal, Conjunctiva are pink and  non-injected, sclera clear OROPHARYNX:no exudate, no erythema and lips, buccal mucosa, and tongue normal  NECK: supple, thyroid normal size, non-tender, without nodularity LYMPH:  no palpable lymphadenopathy in the cervical, axillary or inguinal LUNGS: clear to auscultation and percussion with normal breathing effort HEART: regular rate & rhythm and no murmurs and no lower extremity edema ABDOMEN:abdomen soft, non-tender and normal bowel sounds Musculoskeletal:no cyanosis of digits and no clubbing  NEURO: alert & oriented x 3 with  fluent speech, no focal motor/sensory deficits  LABORATORY DATA:  I have reviewed the data as listed    Component Value Date/Time   NA 139 03/30/2020 0734   NA 139 06/05/2017 0746   K 3.9 03/30/2020 0734   K 4.1 06/05/2017 0746   CL 104 03/30/2020 0734   CO2 27 03/30/2020 0734   CO2 21 (L) 06/05/2017 0746   GLUCOSE 114 (H) 03/30/2020 0734   GLUCOSE 279 (H) 06/05/2017 0746   BUN 17 03/30/2020 0734   BUN 15.0 06/05/2017 0746   CREATININE 0.78 03/30/2020 0734   CREATININE 0.9 06/05/2017 0746   CALCIUM 9.0 03/30/2020 0734   CALCIUM 9.1 01/13/2020 0735   CALCIUM 10.1 06/05/2017 0746   PROT 7.3 03/30/2020 0734   PROT 8.1 06/05/2017 0746   ALBUMIN 3.3 (L) 03/30/2020 0734   ALBUMIN 4.0 06/05/2017 0746   AST 48 (H) 03/30/2020 0734   AST 22 06/05/2017 0746   ALT 45 (H) 03/30/2020 0734   ALT 36 06/05/2017 0746   ALKPHOS 102 03/30/2020 0734   ALKPHOS 98 06/05/2017 0746   BILITOT 0.4 03/30/2020 0734   BILITOT 0.44 06/05/2017 0746   GFRNONAA >60 03/30/2020 0734   GFRAA >60 03/30/2020 0734    No results found for: SPEP, UPEP  Lab Results  Component Value Date   WBC 4.1 03/30/2020   NEUTROABS 2.3 03/30/2020   HGB 8.5 (L) 03/30/2020   HCT 26.5 (L) 03/30/2020   MCV 110.9 (H) 03/30/2020   PLT 178 03/30/2020      Chemistry      Component Value Date/Time   NA 139 03/30/2020 0734   NA 139 06/05/2017 0746   K 3.9 03/30/2020 0734   K 4.1 06/05/2017  0746   CL 104 03/30/2020 0734   CO2 27 03/30/2020 0734   CO2 21 (L) 06/05/2017 0746   BUN 17 03/30/2020 0734   BUN 15.0 06/05/2017 0746   CREATININE 0.78 03/30/2020 0734   CREATININE 0.9 06/05/2017 0746      Component Value Date/Time   CALCIUM 9.0 03/30/2020 0734   CALCIUM 9.1 01/13/2020 0735   CALCIUM 10.1 06/05/2017 0746   ALKPHOS 102 03/30/2020 0734   ALKPHOS 98 06/05/2017 0746   AST 48 (H) 03/30/2020 0734   AST 22 06/05/2017 0746   ALT 45 (H) 03/30/2020 0734   ALT 36 06/05/2017 0746   BILITOT 0.4 03/30/2020 0734   BILITOT 0.44 06/05/2017 0746

## 2020-03-30 NOTE — Assessment & Plan Note (Signed)
She has mild nausea usually around the third day of chemotherapy This also coincided with the day she take Fosamax I recommend she hold Fosamax for few weeks just in case her nausea sensation could be due to gastritis She is also instructed to take antiemetics as needed

## 2020-03-30 NOTE — Patient Instructions (Signed)
[facility] Discharge Instructions for Patients Receiving Chemotherapy  Today you received the following chemotherapy agents: gemcitabine.  To help prevent nausea and vomiting after your treatment, we encourage you to take your nausea medication as directed.   If you develop nausea and vomiting that is not controlled by your nausea medication, call the clinic.   BELOW ARE SYMPTOMS THAT SHOULD BE REPORTED IMMEDIATELY:  *FEVER GREATER THAN 100.5 F  *CHILLS WITH OR WITHOUT FEVER  NAUSEA AND VOMITING THAT IS NOT CONTROLLED WITH YOUR NAUSEA MEDICATION  *UNUSUAL SHORTNESS OF BREATH  *UNUSUAL BRUISING OR BLEEDING  TENDERNESS IN MOUTH AND THROAT WITH OR WITHOUT PRESENCE OF ULCERS  *URINARY PROBLEMS  *BOWEL PROBLEMS  UNUSUAL RASH Items with * indicate a potential emergency and should be followed up as soon as possible.  Feel free to call the clinic should you have any questions or concerns. The clinic phone number is [phone].  Please show the CHEMO ALERT CARD at check-in to the Emergency Department and triage nurse.   

## 2020-03-30 NOTE — Assessment & Plan Note (Signed)
Her recent imaging studies showed the patient has near complete response to therapy This is at the expense of severe intermittent pancytopenia requiring blood transfusion support With single agent gemzar, her counts are better She does not need transfusion support today  I recommend a few months of single agent gemcitabine before repeating another imaging study, due around November

## 2020-03-30 NOTE — Assessment & Plan Note (Signed)

## 2020-04-13 ENCOUNTER — Inpatient Hospital Stay: Payer: Medicare Other

## 2020-04-13 ENCOUNTER — Inpatient Hospital Stay: Payer: Medicare Other | Attending: Gynecology

## 2020-04-13 ENCOUNTER — Other Ambulatory Visit: Payer: Self-pay

## 2020-04-13 VITALS — BP 119/65 | HR 86 | Temp 98.3°F | Resp 18 | Wt 196.2 lb

## 2020-04-13 DIAGNOSIS — E785 Hyperlipidemia, unspecified: Secondary | ICD-10-CM | POA: Insufficient documentation

## 2020-04-13 DIAGNOSIS — C561 Malignant neoplasm of right ovary: Secondary | ICD-10-CM | POA: Diagnosis present

## 2020-04-13 DIAGNOSIS — T451X5A Adverse effect of antineoplastic and immunosuppressive drugs, initial encounter: Secondary | ICD-10-CM

## 2020-04-13 DIAGNOSIS — Z5111 Encounter for antineoplastic chemotherapy: Secondary | ICD-10-CM | POA: Insufficient documentation

## 2020-04-13 DIAGNOSIS — Z7189 Other specified counseling: Secondary | ICD-10-CM

## 2020-04-13 DIAGNOSIS — D6481 Anemia due to antineoplastic chemotherapy: Secondary | ICD-10-CM | POA: Insufficient documentation

## 2020-04-13 LAB — CMP (CANCER CENTER ONLY)
ALT: 27 U/L (ref 0–44)
AST: 33 U/L (ref 15–41)
Albumin: 3.4 g/dL — ABNORMAL LOW (ref 3.5–5.0)
Alkaline Phosphatase: 91 U/L (ref 38–126)
Anion gap: 9 (ref 5–15)
BUN: 16 mg/dL (ref 8–23)
CO2: 28 mmol/L (ref 22–32)
Calcium: 8.5 mg/dL — ABNORMAL LOW (ref 8.9–10.3)
Chloride: 104 mmol/L (ref 98–111)
Creatinine: 0.77 mg/dL (ref 0.44–1.00)
GFR, Estimated: >60 mL/min (ref >60)
Glucose, Bld: 107 mg/dL — ABNORMAL HIGH (ref 70–99)
Potassium: 4.2 mmol/L (ref 3.5–5.1)
Sodium: 141 mmol/L (ref 135–145)
Total Bilirubin: 0.5 mg/dL (ref 0.3–1.2)
Total Protein: 7.4 g/dL (ref 6.5–8.1)

## 2020-04-13 LAB — CBC WITH DIFFERENTIAL (CANCER CENTER ONLY)
Abs Immature Granulocytes: 0.02 10*3/uL (ref 0.00–0.07)
Basophils Absolute: 0 10*3/uL (ref 0.0–0.1)
Basophils Relative: 1 %
Eosinophils Absolute: 0.1 10*3/uL (ref 0.0–0.5)
Eosinophils Relative: 2 %
HCT: 27.4 % — ABNORMAL LOW (ref 36.0–46.0)
Hemoglobin: 8.6 g/dL — ABNORMAL LOW (ref 12.0–15.0)
Immature Granulocytes: 1 %
Lymphocytes Relative: 24 %
Lymphs Abs: 0.8 10*3/uL (ref 0.7–4.0)
MCH: 35.7 pg — ABNORMAL HIGH (ref 26.0–34.0)
MCHC: 31.4 g/dL (ref 30.0–36.0)
MCV: 113.7 fL — ABNORMAL HIGH (ref 80.0–100.0)
Monocytes Absolute: 0.6 10*3/uL (ref 0.1–1.0)
Monocytes Relative: 17 %
Neutro Abs: 1.9 10*3/uL (ref 1.7–7.7)
Neutrophils Relative %: 55 %
Platelet Count: 194 10*3/uL (ref 150–400)
RBC: 2.41 MIL/uL — ABNORMAL LOW (ref 3.87–5.11)
RDW: 19.2 % — ABNORMAL HIGH (ref 11.5–15.5)
WBC Count: 3.3 10*3/uL — ABNORMAL LOW (ref 4.0–10.5)
nRBC: 0 % (ref 0.0–0.2)

## 2020-04-13 LAB — SAMPLE TO BLOOD BANK

## 2020-04-13 MED ORDER — PROCHLORPERAZINE MALEATE 10 MG PO TABS
ORAL_TABLET | ORAL | Status: AC
  Filled 2020-04-13: qty 1

## 2020-04-13 MED ORDER — PROCHLORPERAZINE MALEATE 10 MG PO TABS
10.0000 mg | ORAL_TABLET | Freq: Once | ORAL | Status: AC
  Administered 2020-04-13: 10 mg via ORAL

## 2020-04-13 MED ORDER — SODIUM CHLORIDE 0.9 % IV SOLN
400.0000 mg/m2 | Freq: Once | INTRAVENOUS | Status: AC
  Administered 2020-04-13: 836 mg via INTRAVENOUS
  Filled 2020-04-13: qty 21.99

## 2020-04-13 MED ORDER — HEPARIN SOD (PORK) LOCK FLUSH 100 UNIT/ML IV SOLN
500.0000 [IU] | Freq: Once | INTRAVENOUS | Status: AC | PRN
  Administered 2020-04-13: 500 [IU]
  Filled 2020-04-13: qty 5

## 2020-04-13 MED ORDER — SODIUM CHLORIDE 0.9 % IV SOLN
Freq: Once | INTRAVENOUS | Status: AC
  Filled 2020-04-13: qty 250

## 2020-04-13 MED ORDER — SODIUM CHLORIDE 0.9% FLUSH
10.0000 mL | INTRAVENOUS | Status: DC | PRN
  Administered 2020-04-13: 10 mL
  Filled 2020-04-13: qty 10

## 2020-04-13 NOTE — Patient Instructions (Signed)
[facility] Discharge Instructions for Patients Receiving Chemotherapy  Today you received the following chemotherapy agents: gemcitabine.  To help prevent nausea and vomiting after your treatment, we encourage you to take your nausea medication as directed.   If you develop nausea and vomiting that is not controlled by your nausea medication, call the clinic.   BELOW ARE SYMPTOMS THAT SHOULD BE REPORTED IMMEDIATELY:  *FEVER GREATER THAN 100.5 F  *CHILLS WITH OR WITHOUT FEVER  NAUSEA AND VOMITING THAT IS NOT CONTROLLED WITH YOUR NAUSEA MEDICATION  *UNUSUAL SHORTNESS OF BREATH  *UNUSUAL BRUISING OR BLEEDING  TENDERNESS IN MOUTH AND THROAT WITH OR WITHOUT PRESENCE OF ULCERS  *URINARY PROBLEMS  *BOWEL PROBLEMS  UNUSUAL RASH Items with * indicate a potential emergency and should be followed up as soon as possible.  Feel free to call the clinic should you have any questions or concerns. The clinic phone number is [phone].  Please show the CHEMO ALERT CARD at check-in to the Emergency Department and triage nurse.   

## 2020-04-13 NOTE — Patient Instructions (Signed)

## 2020-04-14 LAB — CA 125: Cancer Antigen (CA) 125: 17.9 U/mL (ref 0.0–38.1)

## 2020-04-16 ENCOUNTER — Telehealth: Payer: Self-pay

## 2020-04-16 NOTE — Telephone Encounter (Signed)
Called and given below message. She verbalzied understanding. 

## 2020-04-16 NOTE — Telephone Encounter (Signed)
-----   Message from Heath Lark, MD sent at 04/16/2020  8:49 AM EDT ----- Regarding: CA-125 is stable

## 2020-04-26 ENCOUNTER — Other Ambulatory Visit: Payer: Self-pay | Admitting: Hematology and Oncology

## 2020-04-26 DIAGNOSIS — E785 Hyperlipidemia, unspecified: Secondary | ICD-10-CM

## 2020-04-27 ENCOUNTER — Other Ambulatory Visit: Payer: Self-pay

## 2020-04-27 ENCOUNTER — Inpatient Hospital Stay: Payer: Medicare Other

## 2020-04-27 ENCOUNTER — Inpatient Hospital Stay (HOSPITAL_BASED_OUTPATIENT_CLINIC_OR_DEPARTMENT_OTHER): Payer: Medicare Other | Admitting: Hematology and Oncology

## 2020-04-27 ENCOUNTER — Encounter: Payer: Self-pay | Admitting: Hematology and Oncology

## 2020-04-27 VITALS — BP 99/70 | HR 70 | Temp 97.6°F | Resp 18 | Ht 67.0 in | Wt 200.2 lb

## 2020-04-27 DIAGNOSIS — C561 Malignant neoplasm of right ovary: Secondary | ICD-10-CM

## 2020-04-27 DIAGNOSIS — D6481 Anemia due to antineoplastic chemotherapy: Secondary | ICD-10-CM

## 2020-04-27 DIAGNOSIS — T451X5A Adverse effect of antineoplastic and immunosuppressive drugs, initial encounter: Secondary | ICD-10-CM | POA: Diagnosis not present

## 2020-04-27 DIAGNOSIS — Z7189 Other specified counseling: Secondary | ICD-10-CM

## 2020-04-27 DIAGNOSIS — E785 Hyperlipidemia, unspecified: Secondary | ICD-10-CM | POA: Diagnosis not present

## 2020-04-27 DIAGNOSIS — Z5111 Encounter for antineoplastic chemotherapy: Secondary | ICD-10-CM | POA: Diagnosis not present

## 2020-04-27 LAB — CMP (CANCER CENTER ONLY)
ALT: 15 U/L (ref 0–44)
AST: 25 U/L (ref 15–41)
Albumin: 3.4 g/dL — ABNORMAL LOW (ref 3.5–5.0)
Alkaline Phosphatase: 83 U/L (ref 38–126)
Anion gap: 8 (ref 5–15)
BUN: 16 mg/dL (ref 8–23)
CO2: 27 mmol/L (ref 22–32)
Calcium: 8.7 mg/dL — ABNORMAL LOW (ref 8.9–10.3)
Chloride: 103 mmol/L (ref 98–111)
Creatinine: 0.76 mg/dL (ref 0.44–1.00)
GFR, Estimated: >60 mL/min (ref >60)
Glucose, Bld: 123 mg/dL — ABNORMAL HIGH (ref 70–99)
Potassium: 4.1 mmol/L (ref 3.5–5.1)
Sodium: 138 mmol/L (ref 135–145)
Total Bilirubin: 0.4 mg/dL (ref 0.3–1.2)
Total Protein: 7.2 g/dL (ref 6.5–8.1)

## 2020-04-27 LAB — CBC WITH DIFFERENTIAL (CANCER CENTER ONLY)
Abs Immature Granulocytes: 0.03 10*3/uL (ref 0.00–0.07)
Basophils Absolute: 0 10*3/uL (ref 0.0–0.1)
Basophils Relative: 1 %
Eosinophils Absolute: 0.1 10*3/uL (ref 0.0–0.5)
Eosinophils Relative: 1 %
HCT: 27.8 % — ABNORMAL LOW (ref 36.0–46.0)
Hemoglobin: 8.6 g/dL — ABNORMAL LOW (ref 12.0–15.0)
Immature Granulocytes: 1 %
Lymphocytes Relative: 18 %
Lymphs Abs: 0.9 10*3/uL (ref 0.7–4.0)
MCH: 35 pg — ABNORMAL HIGH (ref 26.0–34.0)
MCHC: 30.9 g/dL (ref 30.0–36.0)
MCV: 113 fL — ABNORMAL HIGH (ref 80.0–100.0)
Monocytes Absolute: 0.4 10*3/uL (ref 0.1–1.0)
Monocytes Relative: 9 %
Neutro Abs: 3.5 10*3/uL (ref 1.7–7.7)
Neutrophils Relative %: 70 %
Platelet Count: 198 10*3/uL (ref 150–400)
RBC: 2.46 MIL/uL — ABNORMAL LOW (ref 3.87–5.11)
RDW: 17.7 % — ABNORMAL HIGH (ref 11.5–15.5)
WBC Count: 5 10*3/uL (ref 4.0–10.5)
nRBC: 0 % (ref 0.0–0.2)

## 2020-04-27 LAB — SAMPLE TO BLOOD BANK

## 2020-04-27 MED ORDER — SODIUM CHLORIDE 0.9 % IV SOLN
836.0000 mg | Freq: Once | INTRAVENOUS | Status: AC
  Administered 2020-04-27: 836 mg via INTRAVENOUS
  Filled 2020-04-27: qty 21.99

## 2020-04-27 MED ORDER — HEPARIN SOD (PORK) LOCK FLUSH 100 UNIT/ML IV SOLN
500.0000 [IU] | Freq: Once | INTRAVENOUS | Status: AC | PRN
  Administered 2020-04-27: 500 [IU]
  Filled 2020-04-27: qty 5

## 2020-04-27 MED ORDER — PROCHLORPERAZINE MALEATE 10 MG PO TABS
ORAL_TABLET | ORAL | Status: AC
  Filled 2020-04-27: qty 1

## 2020-04-27 MED ORDER — PROCHLORPERAZINE MALEATE 10 MG PO TABS
10.0000 mg | ORAL_TABLET | Freq: Once | ORAL | Status: AC
  Administered 2020-04-27: 10 mg via ORAL

## 2020-04-27 MED ORDER — SODIUM CHLORIDE 0.9% FLUSH
10.0000 mL | Freq: Once | INTRAVENOUS | Status: AC
  Administered 2020-04-27: 10 mL
  Filled 2020-04-27: qty 10

## 2020-04-27 MED ORDER — SODIUM CHLORIDE 0.9% FLUSH
10.0000 mL | INTRAVENOUS | Status: DC | PRN
  Administered 2020-04-27: 10 mL
  Filled 2020-04-27: qty 10

## 2020-04-27 MED ORDER — SODIUM CHLORIDE 0.9 % IV SOLN
Freq: Once | INTRAVENOUS | Status: AC
  Filled 2020-04-27: qty 250

## 2020-04-27 NOTE — Patient Instructions (Signed)
[facility] Discharge Inst[facility] for Patients Receiving Chemotherapy  Today you received the following chemotherapy agents: gemcitabine.  To help prevent nausea and vomiting after your treatment, we encourage you to take your nausea medication as directed.   If you develop nausea and vomiting that is not controlled by your nausea medication, call the clinic.   BELOW ARE SYMPTOMS THAT SHOULD BE REPORTED IMMEDIATELY:  *FEVER GREATER THAN 100.5 F  *CHILLS WITH OR WITHOUT FEVER  NAUSEA AND VOMITING THAT IS NOT CONTROLLED WITH YOUR NAUSEA MEDICATION  *UNUSUAL SHORTNESS OF BREATH  *UNUSUAL BRUISING OR BLEEDING  TENDERNESS IN MOUTH AND THROAT WITH OR WITHOUT PRESENCE OF ULCERS  *URINARY PROBLEMS  *BOWEL PROBLEMS  UNUSUAL RASH Items with * indicate a potential emergency and should be followed up as soon as possible.  Feel free to call the clinic should you have any questions or concerns. The clinic phone number is [phone].  Please show the CHEMO ALERT CARD at check-in to the Emergency Department and triage nurse.   

## 2020-04-27 NOTE — Assessment & Plan Note (Signed)
She has hyperlipidemia and requested lipid panel to be done Unfortunately, she is not fasting today I recommend we draw a lipid panel in her next visit and I recommend her to be fasting before the lab was drawn for more accurate interpretation

## 2020-04-27 NOTE — Assessment & Plan Note (Signed)
With single agent gemzar, her counts are better She does not need transfusion support today I plan to repeat imaging study next month for further follow-up.

## 2020-04-27 NOTE — Assessment & Plan Note (Signed)

## 2020-04-27 NOTE — Patient Instructions (Signed)

## 2020-04-27 NOTE — Progress Notes (Signed)
[place] OFFICE PROGRESS NOTE  Patient Care Team: Lilian Coma., MD as PCP - General (Internal Medicine)  ASSESSMENT & PLAN:  Ovarian cancer on right [facility]) With single agent gemzar, her counts are better She does not need transfusion support today I plan to repeat imaging study next month for further follow-up.  Anemia due to antineoplastic chemotherapy This is likely due to recent treatment. The patient denies recent history of bleeding such as epistaxis, hematuria or hematochezia. She is asymptomatic from the anemia. I will observe for now.  She does not require transfusion now. I will continue the chemotherapy at current dose without dosage adjustment.  If the anemia gets progressive worse in the future, I might have to delay her treatment or adjust the chemotherapy dose.   Hyperlipidemia LDL goal <100 She has hyperlipidemia and requested lipid panel to be done Unfortunately, she is not fasting today I recommend we draw a lipid panel in her next visit and I recommend her to be fasting before the lab was drawn for more accurate interpretation   Orders Placed This Encounter  Procedures  . CT ABDOMEN PELVIS W CONTRAST    Standing Status:   Future    Standing Expiration Date:   04/27/2021    Order Specific Question:   If indicated for the ordered procedure, I authorize the administration of contrast media per Radiology protocol    Answer:   Yes    Order Specific Question:   Preferred imaging location?    Answer:   [facility]    Order Specific Question:   Radiology Contrast Protocol - do NOT remove file path    Answer:   [email or web address]    All questions were answered. The patient knows to call the clinic with any problems, questions or concerns. The total time spent in the appointment was 25 minutes encounter with patients including review of chart and various tests results, discussions about plan of care and  coordination of care plan   Heath Lark, MD 04/27/2020 10:38 AM  INTERVAL HISTORY: Please see below for problem oriented charting. She returns with her husband for further follow-up She feels well Denies signs or symptoms of anemia such as chest pain or shortness of breath The patient denies any recent signs or symptoms of bleeding such as spontaneous epistaxis, hematuria or hematochezia. She have occasional rare nausea.  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    03/20/2017 Imaging   GYN ultrasound was performed this revealed a small fundus measuring 5 cm there are 2 small fibroids within the fundus with the largest measuring 1.3 cm Within the endometrium there is a small amount a fluid the endometrial wall was slightly thickened at 4.8 mm the ultrasound was consistent with a small endometrial polyp  The right adnexa measured 7.4 x 6.7 cm contained a both a cystic and solid mass with a small amount of shadowing This is consistent with a dermoid tumor the left ovary was 2.6 x 1.5 x 1.0 cm There is no other adnexal masses no free  fluid within the abdomen no ascites     04/10/2017 Surgery   Operation: Operative Laparscopy with  Peritoneal biopsy 2 hysteroscopy D&C Surgeon: Lynder Parents MD  Specimens: Peritoneal biopsy and cellular washings  Complications: None FIndings Small amount of ascites was found cellular washings were sent There was peritoneal studding And omental caking Even in steep Trendelenburg the ovaries and uterus were unable  to be visualized due to the small intestines in the lower pelvis     04/10/2017 Pathology Results   Biopsy from [facility] was nondiagnostic.  Peritoneum biopsy did not reveal malignancy.  Endometrial biopsy also did not reveal malignancy.   04/13/2017 Tumor Marker   Patient's tumor was tested for the following markers: CA-125 Results of the tumor marker test revealed 324.1   04/20/2017 Tumor Marker   Patient's tumor was tested for the following markers: CA-125 Results of the tumor marker test revealed 345.6   04/22/2017 Pathology Results   Peritoneum, biopsy - CARCINOMA. - SEE COMMENT.   04/22/2017 Procedure   CT-guided core biopsy performed of peritoneal tumor in the left lower quadrant.   04/24/2017 - 09/25/2017 Chemotherapy   She received carboplatin and Taxol x 3 cycles followed by interval surgical debulking on 12/31. Chemotherapy was subsequently resumed for 3 more cycles   06/05/2017 Tumor Marker   Patient's tumor was tested for the following markers: CA-125 Results of the tumor marker test revealed 27.5   06/17/2017 Imaging   1. Considerable improvement in the prior omental caking, which now presents mainly as an indistinct stranding with slight nodularity along the left upper quadrant omentum. 2. Stable appearance of right ovarian mass with fatty and calcific elements classic for a dermoid. 3. There is accentuated enhancement in the walls of the common hepatic duct and common bile duct. A low-grade cholangitis is not readily excluded. 4. Other imaging findings of potential clinical significance: Mild cardiomegaly. Aortic Atherosclerosis (ICD10-I70.0). Lumbar scoliosis, spondylosis, degenerative disc disease, and pars defects at L5. These cause impingement at L5-S1 and lesser impingement at L3-4 and L4-5. 5. 65% compression fracture at L1 slightly worsened than on the prior exam, with associated vertebral sclerosis and posterior retropulsion.    07/06/2017 Surgery   Bilateral Salpingo-Oophorectomy W/Omentectomy, Total Abd Hysterectomy & Radical Dissection For Debulking Right - Ureterolysis, With Or Without Repositioning Of Ureter For Retroperitoneal Fibrosis   07/06/2017 Pathology Results   A: Omentum, omentectomy - Omentum with minimal residual carcinoma, consistent with high grade serous carcinoma (microscopic; stage ypT3a) - Extensive treatment effect with calcifications, fibrosis, histiocytes, and giant cell response - CRS score 3  B: Uterus with cervix and left ovary and fallopian tube, hysterectomy and left salpingo-oophorectomy - Cervix: Ectocervix and endocervix with no dysplasia or malignancy identified - Endometrium: Inactive with no hyperplasia, atypia, or malignancy identified - Myometrium: Adenomyosis and benign leiomyoma, size 1.2 cm - Serosa: Focal calcification and histiocytes suggestive of treatment effect, with no viable carcinoma identified - Ovary, left: Physiologic changes and no carcinoma identified - Fallopian tube, left: 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 and no malignancy identified  C: Ovary and fallopian tube, right, salpingo-oophorectomy - Right fallopian tube with focal residual carcinoma, consistent with high grade serous carcinoma, and serous tubal intraepithelial carcinoma (STIC) - Associated calcifications and histiocytes suggestive of treatment response - Right ovary with calcifications and histiocytes suggestive of treatment effect, with no definite viable carcinoma identified - Mature cystic teratoma, 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 - See synoptic report and comment   08/14/2017  Tumor Marker   Patient's tumor was tested for the following markers: CA-125 Results of the tumor marker test revealed 14.6   09/04/2017 Tumor Marker   Patient's tumor was tested for the following markers: CA-125 Results of the tumor marker test revealed 12.5   10/03/2017 Genetic Testing   The Common Hereditary Cancer Panel  offered by [facility] includes sequencing and/or deletion duplication testing of the following 47 genes: APC, ATM, AXIN2, BARD1, BMPR1A, BRCA1, BRCA2, BRIP1, CDH1, CDKN2A (p14ARF), CDKN2A (p16INK4a), CKD4, CHEK2, CTNNA1, DICER1, EPCAM (Deletion/duplication testing only), GREM1 (promoter region deletion/duplication testing only), KIT, MEN1, MLH1, MSH2, MSH3, MSH6, MUTYH, NBN, NF1, NHTL1, PALB2, PDGFRA, PMS2, POLD1, POLE, PTEN, RAD50, RAD51C, RAD51D, SDHB, SDHC, SDHD, SMAD4, SMARCA4. STK11, TP53, TSC1, TSC2, and VHL.  The following genes were evaluated for sequence changes only: SDHA and HOXB13 c.251G>A variant only.  Results: Negative, no pathogenic variants identified.  The date of this test report is 10/03/2017.    10/28/2017 Imaging   1. Stable pulmonary nodules. No new or progressive findings. 2. No residual or recurrent omental disease is identified. No obvious peritoneal surface disease. 3. Increasing soft tissue density and ill-defined interstitial changes around the abdominal wall surgical incision. This may be progressive postoperative changes or granulation tissue but attention on future scans to exclude the possibility of tumor. 4. Status post resection of right adnexal mass. No pelvic mass or pelvic adenopathy. 5. Stable T12 compression fracture.   01/25/2018 Tumor Marker   Patient's tumor was tested for the following markers: CA-125 Results of the tumor marker test revealed 9.2   12/10/2018 Tumor Marker   Patient's tumor was tested for the following markers: CA-125 Results of the tumor marker test revealed 212   12/22/2018 Imaging   1. Interval development of multiple soft tissue lesions in the gastrocolic ligament, mesentery, and along the peritoneal surface. These soft tissue lesions measure up to 6.5 cm in size. Associated interval development of moderate volume ascites in the abdomen and pelvis. Imaging features are consistent with metastatic disease. 2. Soft tissue projects through the  rectus sheath in the right paraumbilical region, likely herniated small bowel as there is a large amount of decompressed small bowel does deep to this finding. However, metastatic deposit at this location is also possibility.  3. Trace pneumobilia in the left liver suggests prior sphincterotomy. 4.  Aortic Atherosclerois (ICD10-170.0)   12/30/2018 Procedure   Successful ultrasound-guided therapeutic paracentesis yielding 4.5 liters of peritoneal fluid.   12/30/2018 Procedure   Technically successful right IJ power-injectable port catheter placement. Ready for routine use.   12/31/2018 -  Chemotherapy   The patient had carboplatin and taxol for chemotherapy treatment.     01/21/2019 Tumor Marker   Patient's tumor was tested for the following markers: CA-125 Results of the tumor marker test revealed 408   02/11/2019 Tumor Marker   Patient's tumor was tested for the following markers: CA-125 Results of the tumor marker test revealed 646.   03/02/2019 Imaging   1. Marked improvement in peritoneal carcinomatosis/ascites, without complete resolution. 2.  Aortic atherosclerosis (ICD10-170.0).     03/25/2019 Tumor Marker   Patient's tumor was tested for the following markers: CA-125 Results of the tumor marker test revealed 24.1.   04/15/2019 Tumor Marker   Patient's tumor was tested for the following markers: CA-125 Results of the tumor marker test revealed 15.3   05/18/2019 Imaging   1. There is progressive, nearly complete resolution of peritoneal and omental nodularity seen on prior examination with very  faint residual peritoneal and omental haziness (series 2, image 50, 39).   2. Interval resolution of ascites.   3.  Status post hysterectomy and oophorectomy.   4.  Coronary artery disease.  Aortic Atherosclerosis (ICD10-I70.0).      Tumor Marker   Patient's tumor was tested for the following markers: CA-125 Results of the tumor marker test revealed 11.2.   06/10/2019 Tumor Marker    Patient's tumor was tested for the following markers: CA-125 Results of the tumor marker test revealed 9.1   07/29/2019 Tumor Marker   Patient's tumor was tested for the following markers: CA-125 Results of the tumor marker test revealed 8.8   08/18/2019 Imaging   1. No evidence of new or progressive metastatic disease in the abdomen or pelvis. Stable small nodular soft tissue focus at the right umbilicus. No new peritoneal implants. No ascites. 2. Chronic findings include: Aortic Atherosclerosis (ICD10-I70.0). Coronary atherosclerosis. Chronic moderate T12 vertebral compression fractures.   09/09/2019 Tumor Marker   Patient's tumor was tested for the following markers: CA-125 Results of the tumor marker test revealed 11.4.   10/21/2019 Tumor Marker   Patient's tumor was tested for the following markers: CA-125 Results of the tumor marker test revealed 21.5   10/31/2019 Imaging   1. New hepatic and peritoneal metastatic disease. 2.  Aortic atherosclerosis (ICD10-I70.0).     11/11/2019 -  Chemotherapy   The patient had carboplatin and gemzar for chemotherapy treatment.     11/11/2019 Tumor Marker   Patient's tumor was tested for the following markers: CA-125 Results of the tumor marker test revealed 40.2    Genetic Testing   Patient has genetic testing done for PD-L1. Results revealed patient has the following:  PD-L1 staining in tumor cells (TC): <1% PD-L1 staining in tumor-associated immune cells (IC): 0% PD-L1 combined positive score (CPS): <1%   11/11/2019 -  Chemotherapy   The patient had carboplatin and gemzar for chemotherapy treatment.     12/16/2019 Tumor Marker   Patient's tumor was tested for the following markers: CA-125 Results of the tumor marker test revealed 41.7   01/13/2020 Tumor Marker   Patient's tumor was tested for the following markers: CA-125 Results of the tumor marker test revealed 18.9   02/09/2020 Imaging   1. Positive response to therapy. 2.  Hypodense lesions in liver are smaller or no longer measurable. 3. No clear residual peritoneal metastasis. 4. No ascites. 5. Persistent enhancement of the common bile duct suggest biliary inflammation.     02/10/2020 Tumor Marker   Patient's tumor was tested for the following markers: CA-125 Results of the tumor marker test revealed 13.2.   03/19/2020 Tumor Marker   Patient's tumor was tested for the following markers: CA-125 Results of the tumor marker test revealed 15.5   04/13/2020 Tumor Marker   Patient's tumor was tested for the following markers: CA-`125 Results of the tumor marker test revealed 17.9.     REVIEW OF SYSTEMS:   Constitutional: Denies fevers, chills or abnormal weight loss Eyes: Denies blurriness of vision Ears, nose, mouth, throat, and face: Denies mucositis or sore throat Respiratory: Denies cough, dyspnea or wheezes Cardiovascular: Denies palpitation, chest discomfort or lower extremity swelling Gastrointestinal:  Denies nausea, heartburn or change in bowel habits Skin: Denies abnormal skin rashes Lymphatics: Denies new lymphadenopathy or easy bruising Neurological:Denies numbness, tingling or new weaknesses Behavioral/Psych: Mood is stable, no new changes  All other systems were reviewed with the patient and are negative.  I have reviewed  the past medical history, past surgical history, social history and family history with the patient and they are unchanged from previous note.  ALLERGIES:  is allergic to aspirin, nsaids, and tramadol.  MEDICATIONS:  Current Outpatient Medications  Medication Sig Dispense Refill  . acetaminophen (TYLENOL) 650 MG CR tablet Take 1,300 mg by mouth every 8 (eight) hours.     Marland Kitchen alendronate (FOSAMAX) 70 MG tablet Take 70 mg by mouth every Sunday.     Marland Kitchen atorvastatin (LIPITOR) 80 MG tablet Take 40 mg by mouth at bedtime.     . Cholecalciferol (VITAMIN D3) 2000 units TABS Take 2,000 Units by mouth daily.    . citalopram  (CELEXA) 10 MG tablet Take 10 mg by mouth at bedtime    . ferrous sulfate 325 (65 FE) MG tablet Take 325 mg by mouth daily. 3 hours after meal and 30 min before a meal    . gabapentin (NEURONTIN) 300 MG capsule Take by mouth.    . Glucosamine Sulfate 500 MG TABS Take 500 mg by mouth 2 (two) times daily.     Marland Kitchen lidocaine-prilocaine (EMLA) cream Apply to affected area once 30 g 3  . loratadine (CLARITIN) 10 MG tablet Take 10 mg by mouth at bedtime.     Marland Kitchen losartan (COZAAR) 25 MG tablet Take 25 mg by mouth daily.     . meclizine (ANTIVERT) 25 MG tablet Take 25 mg by mouth 2 (two) times daily as needed for dizziness.     . metFORMIN (GLUCOPHAGE) 1000 MG tablet Take 1,000 mg by mouth 2 (two) times daily with a meal.     . omeprazole (PRILOSEC) 40 MG capsule Take 40 mg by mouth daily.     . ondansetron (ZOFRAN) 8 MG tablet Take 1 tablet (8 mg total) by mouth every 8 (eight) hours as needed for refractory nausea / vomiting. 30 tablet 1  . pioglitazone (ACTOS) 15 MG tablet Take 15 mg by mouth at bedtime.     . prochlorperazine (COMPAZINE) 10 MG tablet Take 1 tablet (10 mg total) by mouth every 6 (six) hours as needed (Nausea or vomiting). 60 tablet 1  . Pyridoxine HCl (VITAMIN B-6 PO) Take 50 mg by mouth 1 day or 1 dose.    . vitamin B-12 (CYANOCOBALAMIN) 1000 MCG tablet Take 1,000 mcg by mouth daily.      No current facility-administered medications for this visit.   Facility-Administered Medications Ordered in Other Visits  Medication Dose Route Frequency Provider Last Rate Last Admin  . gemcitabine (GEMZAR) 836 mg in sodium chloride 0.9 % 250 mL chemo infusion  836 mg Intravenous Once [name], Yeray Tomas, MD      . heparin lock flush 100 unit/mL  500 Units Intracatheter Once PRN [name], Jani Ploeger, MD      . sodium chloride flush (NS) 0.9 % injection 10 mL  10 mL Intracatheter Once [name], Hattie Pine, MD      . sodium chloride flush (NS) 0.9 % injection 10 mL  10 mL Intracatheter PRN [name], Kayelyn Lemon, MD        PHYSICAL  EXAMINATION: ECOG PERFORMANCE STATUS: 0 - Asymptomatic  Vitals:   04/27/20 1010 04/27/20 1021  BP: (!) 123/59 99/70  Pulse: 85 70  Resp: 18 18  Temp: 98.6 F (37 C) 97.6 F (36.4 C)  SpO2: 97% 100%   Filed Weights   04/27/20 1010 04/27/20 1021  Weight: 194 lb 3.2 oz (88.1 kg) 200 lb 3.2 oz (90.8 kg)    GENERAL:alert, no  distress and comfortable SKIN: skin color, texture, turgor are normal, no rashes or significant lesions EYES: normal, Conjunctiva are pink and non-injected, sclera clear OROPHARYNX:no exudate, no erythema and lips, buccal mucosa, and tongue normal  NECK: supple, thyroid normal size, non-tender, without nodularity LYMPH:  no palpable lymphadenopathy in the cervical, axillary or inguinal LUNGS: clear to auscultation and percussion with normal breathing effort HEART: regular rate & rhythm and no murmurs and no lower extremity edema ABDOMEN:abdomen soft, non-tender and normal bowel sounds Musculoskeletal:no cyanosis of digits and no clubbing  NEURO: alert & oriented x 3 with fluent speech, no focal motor/sensory deficits  LABORATORY DATA:  I have reviewed the data as listed    Component Value Date/Time   NA 138 04/27/2020 0922   NA 139 06/05/2017 0746   K 4.1 04/27/2020 0922   K 4.1 06/05/2017 0746   CL 103 04/27/2020 0922   CO2 27 04/27/2020 0922   CO2 21 (L) 06/05/2017 0746   GLUCOSE 123 (H) 04/27/2020 0922   GLUCOSE 279 (H) 06/05/2017 0746   BUN 16 04/27/2020 0922   BUN 15.0 06/05/2017 0746   CREATININE 0.76 04/27/2020 0922   CREATININE 0.9 06/05/2017 0746   CALCIUM 8.7 (L) 04/27/2020 0922   CALCIUM 9.1 01/13/2020 0735   CALCIUM 10.1 06/05/2017 0746   PROT 7.2 04/27/2020 0922   PROT 8.1 06/05/2017 0746   ALBUMIN 3.4 (L) 04/27/2020 0922   ALBUMIN 4.0 06/05/2017 0746   AST 25 04/27/2020 0922   AST 22 06/05/2017 0746   ALT 15 04/27/2020 0922   ALT 36 06/05/2017 0746   ALKPHOS 83 04/27/2020 0922   ALKPHOS 98 06/05/2017 0746   BILITOT 0.4  04/27/2020 0922   BILITOT 0.44 06/05/2017 0746   GFRNONAA >60 04/27/2020 0922   GFRAA >60 03/30/2020 0734    No results found for: SPEP, UPEP  Lab Results  Component Value Date   WBC 5.0 04/27/2020   NEUTROABS 3.5 04/27/2020   HGB 8.6 (L) 04/27/2020   HCT 27.8 (L) 04/27/2020   MCV 113.0 (H) 04/27/2020   PLT 198 04/27/2020      Chemistry      Component Value Date/Time   NA 138 04/27/2020 0922   NA 139 06/05/2017 0746   K 4.1 04/27/2020 0922   K 4.1 06/05/2017 0746   CL 103 04/27/2020 0922   CO2 27 04/27/2020 0922   CO2 21 (L) 06/05/2017 0746   BUN 16 04/27/2020 0922   BUN 15.0 06/05/2017 0746   CREATININE 0.76 04/27/2020 0922   CREATININE 0.9 06/05/2017 0746      Component Value Date/Time   CALCIUM 8.7 (L) 04/27/2020 0922   CALCIUM 9.1 01/13/2020 0735   CALCIUM 10.1 06/05/2017 0746   ALKPHOS 83 04/27/2020 0922   ALKPHOS 98 06/05/2017 0746   AST 25 04/27/2020 0922   AST 22 06/05/2017 0746   ALT 15 04/27/2020 0922   ALT 36 06/05/2017 0746   BILITOT 0.4 04/27/2020 0922   BILITOT 0.44 06/05/2017 0746

## 2020-05-11 ENCOUNTER — Inpatient Hospital Stay: Payer: Medicare Other

## 2020-05-11 ENCOUNTER — Inpatient Hospital Stay: Payer: Medicare Other | Attending: Gynecology

## 2020-05-11 ENCOUNTER — Other Ambulatory Visit: Payer: Self-pay

## 2020-05-11 VITALS — BP 140/75 | HR 77 | Temp 98.9°F | Resp 18 | Wt 192.5 lb

## 2020-05-11 DIAGNOSIS — Z5111 Encounter for antineoplastic chemotherapy: Secondary | ICD-10-CM | POA: Insufficient documentation

## 2020-05-11 DIAGNOSIS — E785 Hyperlipidemia, unspecified: Secondary | ICD-10-CM | POA: Diagnosis not present

## 2020-05-11 DIAGNOSIS — C561 Malignant neoplasm of right ovary: Secondary | ICD-10-CM | POA: Insufficient documentation

## 2020-05-11 DIAGNOSIS — Z7189 Other specified counseling: Secondary | ICD-10-CM

## 2020-05-11 LAB — CBC WITH DIFFERENTIAL (CANCER CENTER ONLY)
Abs Immature Granulocytes: 0.03 10*3/uL (ref 0.00–0.07)
Basophils Absolute: 0 10*3/uL (ref 0.0–0.1)
Basophils Relative: 1 %
Eosinophils Absolute: 0.1 10*3/uL (ref 0.0–0.5)
Eosinophils Relative: 2 %
HCT: 28.8 % — ABNORMAL LOW (ref 36.0–46.0)
Hemoglobin: 9 g/dL — ABNORMAL LOW (ref 12.0–15.0)
Immature Granulocytes: 1 %
Lymphocytes Relative: 22 %
Lymphs Abs: 0.9 10*3/uL (ref 0.7–4.0)
MCH: 34.9 pg — ABNORMAL HIGH (ref 26.0–34.0)
MCHC: 31.3 g/dL (ref 30.0–36.0)
MCV: 111.6 fL — ABNORMAL HIGH (ref 80.0–100.0)
Monocytes Absolute: 0.4 10*3/uL (ref 0.1–1.0)
Monocytes Relative: 9 %
Neutro Abs: 2.6 10*3/uL (ref 1.7–7.7)
Neutrophils Relative %: 65 %
Platelet Count: 227 10*3/uL (ref 150–400)
RBC: 2.58 MIL/uL — ABNORMAL LOW (ref 3.87–5.11)
RDW: 16.4 % — ABNORMAL HIGH (ref 11.5–15.5)
WBC Count: 4 10*3/uL (ref 4.0–10.5)
nRBC: 0 % (ref 0.0–0.2)

## 2020-05-11 LAB — CMP (CANCER CENTER ONLY)
ALT: 16 U/L (ref 0–44)
AST: 22 U/L (ref 15–41)
Albumin: 3.5 g/dL (ref 3.5–5.0)
Alkaline Phosphatase: 80 U/L (ref 38–126)
Anion gap: 7 (ref 5–15)
BUN: 17 mg/dL (ref 8–23)
CO2: 27 mmol/L (ref 22–32)
Calcium: 8.4 mg/dL — ABNORMAL LOW (ref 8.9–10.3)
Chloride: 105 mmol/L (ref 98–111)
Creatinine: 0.72 mg/dL (ref 0.44–1.00)
GFR, Estimated: >60 mL/min (ref >60)
Glucose, Bld: 120 mg/dL — ABNORMAL HIGH (ref 70–99)
Potassium: 4.1 mmol/L (ref 3.5–5.1)
Sodium: 139 mmol/L (ref 135–145)
Total Bilirubin: 0.3 mg/dL (ref 0.3–1.2)
Total Protein: 7.4 g/dL (ref 6.5–8.1)

## 2020-05-11 LAB — LIPID PANEL
Cholesterol: 165 mg/dL (ref 0–200)
HDL: 52 mg/dL (ref >40)
LDL Cholesterol: 90 mg/dL (ref 0–99)
Total CHOL/HDL Ratio: 3.2 RATIO
Triglycerides: 113 mg/dL (ref <150)
VLDL: 23 mg/dL (ref 0–40)

## 2020-05-11 MED ORDER — PROCHLORPERAZINE MALEATE 10 MG PO TABS
ORAL_TABLET | ORAL | Status: AC
  Filled 2020-05-11: qty 1

## 2020-05-11 MED ORDER — SODIUM CHLORIDE 0.9% FLUSH
10.0000 mL | INTRAVENOUS | Status: DC | PRN
  Administered 2020-05-11: 10 mL
  Filled 2020-05-11: qty 10

## 2020-05-11 MED ORDER — SODIUM CHLORIDE 0.9% FLUSH
10.0000 mL | Freq: Once | INTRAVENOUS | Status: AC
  Administered 2020-05-11: 10 mL
  Filled 2020-05-11: qty 10

## 2020-05-11 MED ORDER — SODIUM CHLORIDE 0.9 % IV SOLN
836.0000 mg | Freq: Once | INTRAVENOUS | Status: AC
  Administered 2020-05-11: 836 mg via INTRAVENOUS
  Filled 2020-05-11: qty 21.99

## 2020-05-11 MED ORDER — HEPARIN SOD (PORK) LOCK FLUSH 100 UNIT/ML IV SOLN
500.0000 [IU] | Freq: Once | INTRAVENOUS | Status: AC | PRN
  Administered 2020-05-11: 500 [IU]
  Filled 2020-05-11: qty 5

## 2020-05-11 MED ORDER — SODIUM CHLORIDE 0.9 % IV SOLN
Freq: Once | INTRAVENOUS | Status: AC
  Filled 2020-05-11: qty 250

## 2020-05-11 MED ORDER — PROCHLORPERAZINE MALEATE 10 MG PO TABS
10.0000 mg | ORAL_TABLET | Freq: Once | ORAL | Status: AC
  Administered 2020-05-11: 10 mg via ORAL

## 2020-05-11 NOTE — Patient Instructions (Signed)
[place] Discharge Instructions for Patients Receiving Chemotherapy  Today you received the following chemotherapy agents: Gemzar (gemcitabine)  To help prevent nausea and vomiting after your treatment, we encourage you to take your nausea medication as directed.   If you develop nausea and vomiting that is not controlled by your nausea medication, call the clinic.   BELOW ARE SYMPTOMS THAT SHOULD BE REPORTED IMMEDIATELY:  *FEVER GREATER THAN 100.5 F  *CHILLS WITH OR WITHOUT FEVER  NAUSEA AND VOMITING THAT IS NOT CONTROLLED WITH YOUR NAUSEA MEDICATION  *UNUSUAL SHORTNESS OF BREATH  *UNUSUAL BRUISING OR BLEEDING  TENDERNESS IN MOUTH AND THROAT WITH OR WITHOUT PRESENCE OF ULCERS  *URINARY PROBLEMS  *BOWEL PROBLEMS  UNUSUAL RASH Items with * indicate a potential emergency and should be followed up as soon as possible.  Feel free to call the clinic should you have any questions or concerns. The clinic phone number is [phone].  Please show the [place] at check-in to the Emergency Department and triage nurse.

## 2020-05-12 LAB — CA 125: Cancer Antigen (CA) 125: 20.1 U/mL (ref 0.0–38.1)

## 2020-05-24 ENCOUNTER — Encounter (HOSPITAL_COMMUNITY): Payer: Self-pay

## 2020-05-24 ENCOUNTER — Other Ambulatory Visit: Payer: Self-pay

## 2020-05-24 ENCOUNTER — Ambulatory Visit (HOSPITAL_COMMUNITY)
Admission: RE | Admit: 2020-05-24 | Discharge: 2020-05-24 | Disposition: A | Discharge status: Home / Self Care | Payer: Medicare Other | Source: Ambulatory Visit | Attending: Hematology and Oncology | Admitting: Hematology and Oncology

## 2020-05-24 DIAGNOSIS — C561 Malignant neoplasm of right ovary: Secondary | ICD-10-CM

## 2020-05-24 MED ORDER — IOHEXOL 9 MG/ML PO SOLN
500.0000 mL | ORAL | Status: AC
  Administered 2020-05-24 (×2): 500 mL via ORAL

## 2020-05-24 MED ORDER — HEPARIN SOD (PORK) LOCK FLUSH 100 UNIT/ML IV SOLN
500.0000 [IU] | Freq: Once | INTRAVENOUS | Status: AC
  Administered 2020-05-24: 500 [IU] via INTRAVENOUS

## 2020-05-24 MED ORDER — HEPARIN SOD (PORK) LOCK FLUSH 100 UNIT/ML IV SOLN
INTRAVENOUS | Status: AC
  Filled 2020-05-24: qty 5

## 2020-05-24 MED ORDER — IOHEXOL 9 MG/ML PO SOLN
ORAL | Status: AC
  Filled 2020-05-24: qty 1000

## 2020-05-24 MED ORDER — IOHEXOL 300 MG/ML  SOLN
100.0000 mL | Freq: Once | INTRAMUSCULAR | Status: AC | PRN
  Administered 2020-05-24: 100 mL via INTRAVENOUS

## 2020-05-25 ENCOUNTER — Inpatient Hospital Stay (HOSPITAL_BASED_OUTPATIENT_CLINIC_OR_DEPARTMENT_OTHER): Payer: Medicare Other | Admitting: Hematology and Oncology

## 2020-05-25 ENCOUNTER — Inpatient Hospital Stay: Payer: Medicare Other

## 2020-05-25 ENCOUNTER — Other Ambulatory Visit: Payer: Self-pay

## 2020-05-25 ENCOUNTER — Encounter: Payer: Self-pay | Admitting: Hematology and Oncology

## 2020-05-25 ENCOUNTER — Telehealth: Payer: Self-pay | Admitting: Hematology and Oncology

## 2020-05-25 VITALS — BP 150/63 | HR 91 | Temp 99.2°F | Resp 20 | Wt 191.4 lb

## 2020-05-25 DIAGNOSIS — C561 Malignant neoplasm of right ovary: Secondary | ICD-10-CM

## 2020-05-25 DIAGNOSIS — Z7189 Other specified counseling: Secondary | ICD-10-CM | POA: Diagnosis not present

## 2020-05-25 DIAGNOSIS — Z5111 Encounter for antineoplastic chemotherapy: Secondary | ICD-10-CM | POA: Diagnosis not present

## 2020-05-25 NOTE — Progress Notes (Signed)
DISCONTINUE ON PATHWAY REGIMEN - Ovarian     A cycle is every 21 days:     Gemcitabine      Carboplatin   **Always confirm dose/schedule in your pharmacy ordering system**  REASON: Disease Progression PRIOR TREATMENT: [id number]: Carboplatin AUC=4 D1 + Gemcitabine 800 mg/m2 D1, 8 q21 Days; Re-evaluate Every 3 Cycles, Treat until Complete Response, Unacceptable Toxicity, or Disease Progression TREATMENT RESPONSE: Progressive Disease (PD)  START ON PATHWAY REGIMEN - Ovarian     A cycle is every 28 days:     Bevacizumab-xxxx      Paclitaxel   **Always confirm dose/schedule in your pharmacy ordering system**  Patient Characteristics: Recurrent or Progressive Disease, Third Line, Platinum Resistant or < 6 Months Since Last Platinum Therapy BRCA Mutation Status: Absent Therapeutic Status: Recurrent or Progressive Disease Line of Therapy: Third Line  Intent of Therapy: Non-Curative / Palliative Intent, Discussed with Patient

## 2020-05-25 NOTE — Assessment & Plan Note (Signed)
We have extensive discussions about goals of care Treatment goal is palliative We discussed prognosis with or without treatment Ultimately, she would like to proceed with palliative chemotherapy

## 2020-05-25 NOTE — Progress Notes (Signed)
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 The intent is for palliative.  We discussed some of the risks, benefits, side-effects of paclitaxel.   Some of the short term side-effects included, though not limited to, risk of severe allergic reaction, fatigue, weight loss, pancytopenia, life-threatening infections, need for transfusions of blood products, nausea, vomiting, change in bowel habits, loss of hair, admission to hospital for various reasons, and risks of death.   Long term side-effects are also discussed including risks of infertility, permanent damage to nerve function, chronic fatigue, and rare secondary malignancy including bone marrow disorders.   The patient is aware that the response rates discussed earlier is not guaranteed.  After a long discussion, patient made an informed decision to proceed with the prescribed plan of care and went ahead to sign the consent form today.   Patient education material was dispensed. I plan to adjust her chemo cycle with treatment on days 1 and 8, rest day 15 for cycle of every 21 days I will see her prior to day 8 of treatment for toxicity review  Goals of care, counseling/discussion We have extensive discussions about goals of care Treatment goal is palliative We discussed prognosis with or without treatment Ultimately, she would like to proceed with  palliative chemotherapy   Orders Placed This Encounter  Procedures  . CBC with Differential (Cancer Center Only)    Standing Status:   Standing    Number of Occurrences:   20    Standing Expiration Date:   05/25/2021  . CMP ([facility] only)    Standing Status:   Standing    Number of Occurrences:   20    Standing Expiration Date:   05/25/2021    All questions were answered. The patient knows to call the clinic with any problems, questions or concerns. The total time spent in the appointment was 40 minutes encounter with patients including review of chart and various tests results, discussions about plan of care and coordination of care plan   Heath Lark, MD 05/25/2020 11:43 AM  INTERVAL HISTORY: Please see below for problem oriented charting. 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    03/20/2017 Imaging   GYN ultrasound was performed this revealed a small fundus measuring 5 cm there are 2 small fibroids within the fundus with the largest measuring 1.3 cm Within the  endometrium there is a small amount a fluid the endometrial wall was slightly thickened at 4.8 mm the ultrasound was consistent with a small endometrial polyp  The right adnexa measured 7.4 x 6.7 cm contained a both a cystic and solid mass with a small amount of shadowing This is consistent with a dermoid  tumor the left ovary was 2.6 x 1.5 x 1.0 cm There is no other adnexal masses no free fluid within the abdomen no ascites     04/10/2017 Surgery   Operation: Operative Laparscopy with  Peritoneal biopsy 2 hysteroscopy D&C Surgeon: Lynder Parents MD  Specimens: Peritoneal biopsy and cellular washings  Complications: None FIndings Small amount of ascites was found cellular washings were sent There was peritoneal studding And omental caking Even in steep Trendelenburg the ovaries and uterus were unable to be visualized due to the small intestines in the lower pelvis     04/10/2017 Pathology Results   Biopsy from [facility] was nondiagnostic.  Peritoneum biopsy did not reveal malignancy.  Endometrial biopsy also did not reveal malignancy.   04/13/2017 Tumor Marker   Patient's tumor was tested for the following markers: CA-125 Results of the tumor marker test revealed 324.1   04/20/2017 Tumor Marker   Patient's tumor was tested for the following markers: CA-125 Results of the tumor marker test revealed 345.6   04/22/2017 Pathology Results   Peritoneum, biopsy - CARCINOMA. - SEE COMMENT.   04/22/2017 Procedure   CT-guided core biopsy performed of peritoneal tumor in the left lower quadrant.   04/24/2017 - 09/25/2017 Chemotherapy   She received carboplatin and Taxol x 3 cycles followed by interval surgical debulking on 12/31. Chemotherapy was subsequently resumed for 3 more cycles   06/05/2017 Tumor Marker   Patient's tumor was tested for the following markers: CA-125 Results of the tumor marker test revealed 27.5   06/17/2017 Imaging   1. Considerable improvement in the prior omental caking, which now presents mainly as an indistinct stranding with slight nodularity along the left upper quadrant omentum. 2. Stable appearance of right ovarian mass with fatty and calcific elements classic for a dermoid. 3. There is accentuated enhancement in the walls of  the common hepatic duct and common bile duct. A low-grade cholangitis is not readily excluded. 4. Other imaging findings of potential clinical significance: Mild cardiomegaly. Aortic Atherosclerosis (ICD10-I70.0). Lumbar scoliosis, spondylosis, degenerative disc disease, and pars defects at L5. These cause impingement at L5-S1 and lesser impingement at L3-4 and L4-5. 5. 65% compression fracture at L1 slightly worsened than on the prior exam, with associated vertebral sclerosis and posterior retropulsion.   07/06/2017 Surgery   Bilateral Salpingo-Oophorectomy W/Omentectomy, Total Abd Hysterectomy & Radical Dissection For Debulking Right - Ureterolysis, With Or Without Repositioning Of Ureter For Retroperitoneal Fibrosis   07/06/2017 Pathology Results   A: Omentum, omentectomy - Omentum with minimal residual carcinoma, consistent with high grade serous carcinoma (microscopic; stage ypT3a) - Extensive treatment effect with calcifications, fibrosis, histiocytes, and giant cell response - CRS score 3  B: Uterus with cervix and left ovary and fallopian tube, hysterectomy and left salpingo-oophorectomy - Cervix: Ectocervix and endocervix with no dysplasia or malignancy identified - Endometrium: Inactive with no hyperplasia, atypia, or malignancy identified - Myometrium: Adenomyosis and benign leiomyoma, size 1.2 cm - Serosa: Focal calcification and histiocytes suggestive of treatment effect, with no viable carcinoma identified - Ovary, left: Physiologic changes and no carcinoma identified - Fallopian tube, left: 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  and no malignancy identified  C: Ovary and fallopian tube, right, salpingo-oophorectomy - Right fallopian tube with focal residual carcinoma, consistent with high grade serous carcinoma, and serous tubal intraepithelial carcinoma (STIC) - Associated calcifications and histiocytes suggestive of treatment response - Right ovary with  calcifications and histiocytes suggestive of treatment effect, with no definite viable carcinoma identified - Mature cystic teratoma, 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 - See synoptic report and comment   08/14/2017 Tumor Marker   Patient's tumor was tested for the following markers: CA-125 Results of the tumor marker test revealed 14.6   09/04/2017 Tumor Marker   Patient's tumor was tested for the following markers: CA-125 Results of the tumor marker test revealed 12.5   10/03/2017 Genetic Testing   The Common Hereditary Cancer Panel offered by [facility] includes sequencing and/or deletion duplication testing of the following 47 genes: APC, ATM, AXIN2, BARD1, BMPR1A, BRCA1, BRCA2, BRIP1, CDH1, CDKN2A (p14ARF), CDKN2A (p16INK4a), CKD4, CHEK2, CTNNA1, DICER1, EPCAM (Deletion/duplication testing only), GREM1 (promoter region deletion/duplication testing only), KIT, MEN1, MLH1, MSH2, MSH3, MSH6, MUTYH, NBN, NF1, NHTL1, PALB2, PDGFRA, PMS2, POLD1, POLE, PTEN, RAD50, RAD51C, RAD51D, SDHB, SDHC, SDHD, SMAD4, SMARCA4. STK11, TP53, TSC1, TSC2, and VHL.  The following genes were evaluated for sequence changes only: SDHA and HOXB13 c.251G>A variant only.  Results: Negative, no pathogenic variants identified.  The date of this test report is 10/03/2017.    10/28/2017 Imaging   1. Stable pulmonary nodules. No new or progressive findings. 2. No residual or recurrent omental disease is identified. No obvious peritoneal surface disease. 3. Increasing soft tissue density and ill-defined interstitial changes around the abdominal wall surgical incision. This may be progressive postoperative changes or granulation tissue but attention on future scans to exclude the possibility of tumor. 4. Status post resection of right adnexal mass. No pelvic mass or pelvic adenopathy. 5. Stable T12 compression fracture.   01/25/2018 Tumor Marker   Patient's tumor was tested for the following markers: CA-125 Results of the tumor marker test revealed 9.2    12/10/2018 Tumor Marker   Patient's tumor was tested for the following markers: CA-125 Results of the tumor marker test revealed 212   12/22/2018 Imaging   1. Interval development of multiple soft tissue lesions in the gastrocolic ligament, mesentery, and along the peritoneal surface. These soft tissue lesions measure up to 6.5 cm in size. Associated interval development of moderate volume ascites in the abdomen and pelvis. Imaging features are consistent with metastatic disease. 2. Soft tissue projects through the rectus sheath in the right paraumbilical region, likely herniated small bowel as there is a large amount of decompressed small bowel does deep to this finding. However, metastatic deposit at this location is also possibility.  3. Trace pneumobilia in the left liver suggests prior sphincterotomy. 4.  Aortic Atherosclerois (ICD10-170.0)   12/30/2018 Procedure   Successful ultrasound-guided therapeutic paracentesis yielding 4.5 liters of peritoneal fluid.   12/30/2018 Procedure   Technically successful right IJ power-injectable port catheter placement. Ready for routine use.   12/31/2018 -  Chemotherapy   The patient had carboplatin and taxol for chemotherapy treatment.     01/21/2019 Tumor Marker   Patient's tumor was tested for the following markers: CA-125 Results of the tumor marker test revealed 408   02/11/2019 Tumor Marker   Patient's tumor was tested for the following markers: CA-125 Results of the tumor marker test revealed 646.   03/02/2019 Imaging   1. Marked improvement in peritoneal carcinomatosis/ascites, without complete resolution. 2.  Aortic atherosclerosis (ICD10-170.0).  03/25/2019 Tumor Marker   Patient's tumor was tested for the following markers: CA-125 Results of the tumor marker test revealed 24.1.   04/15/2019 Tumor Marker   Patient's tumor was tested for the following markers: CA-125 Results of the tumor marker test revealed 15.3   05/18/2019  Imaging   1. There is progressive, nearly complete resolution of peritoneal and omental nodularity seen on prior examination with very faint residual peritoneal and omental haziness (series 2, image 50, 39).   2. Interval resolution of ascites.   3.  Status post hysterectomy and oophorectomy.   4.  Coronary artery disease.  Aortic Atherosclerosis (ICD10-I70.0).      Tumor Marker   Patient's tumor was tested for the following markers: CA-125 Results of the tumor marker test revealed 11.2.   06/10/2019 Tumor Marker   Patient's tumor was tested for the following markers: CA-125 Results of the tumor marker test revealed 9.1   07/29/2019 Tumor Marker   Patient's tumor was tested for the following markers: CA-125 Results of the tumor marker test revealed 8.8   08/18/2019 Imaging   1. No evidence of new or progressive metastatic disease in the abdomen or pelvis. Stable small nodular soft tissue focus at the right umbilicus. No new peritoneal implants. No ascites. 2. Chronic findings include: Aortic Atherosclerosis (ICD10-I70.0). Coronary atherosclerosis. Chronic moderate T12 vertebral compression fractures.   09/09/2019 Tumor Marker   Patient's tumor was tested for the following markers: CA-125 Results of the tumor marker test revealed 11.4.   10/21/2019 Tumor Marker   Patient's tumor was tested for the following markers: CA-125 Results of the tumor marker test revealed 21.5   10/31/2019 Imaging   1. New hepatic and peritoneal metastatic disease. 2.  Aortic atherosclerosis (ICD10-I70.0).     11/11/2019 -  Chemotherapy   The patient had carboplatin and gemzar for chemotherapy treatment.     11/11/2019 Tumor Marker   Patient's tumor was tested for the following markers: CA-125 Results of the tumor marker test revealed 40.2    Genetic Testing   Patient has genetic testing done for PD-L1. Results revealed patient has the following:  PD-L1 staining in tumor cells (TC): <1% PD-L1  staining in tumor-associated immune cells (IC): 0% PD-L1 combined positive score (CPS): <1%   11/11/2019 - 05/11/2020 Chemotherapy   The patient had carboplatin and gemzar for chemotherapy treatment    12/16/2019 Tumor Marker   Patient's tumor was tested for the following markers: CA-125 Results of the tumor marker test revealed 41.7   01/13/2020 Tumor Marker   Patient's tumor was tested for the following markers: CA-125 Results of the tumor marker test revealed 18.9   02/09/2020 Imaging   1. Positive response to therapy. 2. Hypodense lesions in liver are smaller or no longer measurable. 3. No clear residual peritoneal metastasis. 4. No ascites. 5. Persistent enhancement of the common bile duct suggest biliary inflammation.     02/10/2020 Tumor Marker   Patient's tumor was tested for the following markers: CA-125 Results of the tumor marker test revealed 13.2.   03/19/2020 Tumor Marker   Patient's tumor was tested for the following markers: CA-125 Results of the tumor marker test revealed 15.5   04/13/2020 Tumor Marker   Patient's tumor was tested for the following markers: CA-`125 Results of the tumor marker test revealed 17.9.   05/11/2020 Tumor Marker   Patient's tumor was tested for the following markers: CA-125 Results of the tumor marker test revealed 20.1.   05/24/2020 Imaging  1. Interval progression of soft tissue nodularity in the cul-de-sac, along the vaginal cuff. 2. Interval progression of inferior right liver lesion with new tiny capsular soft tissue lesions along the left liver and inferior right liver. Imaging features are highly concerning for metastatic disease. 3. Small volume ascites. 4. Similar appearance of focal enhancement in the region of the umbilicus. 5. Aortic Atherosclerosis (ICD10-I70.0).   06/01/2020 -  Chemotherapy   The patient had PACLitaxel (TAXOL) 162 mg in sodium chloride 0.9 % 250 mL chemo infusion (</= 21m/m2), 80 mg/m2 = 162 mg, Intravenous,   Once, 0 of 3 cycles  for chemotherapy treatment.      REVIEW OF SYSTEMS:   Constitutional: Denies fevers, chills or abnormal weight loss Eyes: Denies blurriness of vision Ears, nose, mouth, throat, and face: Denies mucositis or sore throat Respiratory: Denies cough, dyspnea or wheezes Cardiovascular: Denies palpitation, chest discomfort or lower extremity swelling Gastrointestinal:  Denies nausea, heartburn or change in bowel habits Skin: Denies abnormal skin rashes Lymphatics: Denies new lymphadenopathy or easy bruising Neurological:Denies numbness, tingling or new weaknesses Behavioral/Psych: Mood is stable, no new changes  All other systems were reviewed with the patient and are negative.  I have reviewed the past medical history, past surgical history, social history and family history with the patient and they are unchanged from previous note.  ALLERGIES:  is allergic to aspirin, nsaids, and tramadol.  MEDICATIONS:  Current Outpatient Medications  Medication Sig Dispense Refill  . acetaminophen (TYLENOL) 650 MG CR tablet Take 1,300 mg by mouth every 8 (eight) hours.     .Marland Kitchenalendronate (FOSAMAX) 70 MG tablet Take 70 mg by mouth every Sunday.     .Marland Kitchenatorvastatin (LIPITOR) 80 MG tablet Take 40 mg by mouth at bedtime.     . Cholecalciferol (VITAMIN D3) 2000 units TABS Take 2,000 Units by mouth daily.    . citalopram (CELEXA) 10 MG tablet Take 10 mg by mouth at bedtime    . ferrous sulfate 325 (65 FE) MG tablet Take 325 mg by mouth daily. 3 hours after meal and 30 min before a meal    . gabapentin (NEURONTIN) 300 MG capsule Take by mouth.    . Glucosamine Sulfate 500 MG TABS Take 500 mg by mouth 2 (two) times daily.     .Marland Kitchenlidocaine-prilocaine (EMLA) cream Apply to affected area once 30 g 3  . loratadine (CLARITIN) 10 MG tablet Take 10 mg by mouth at bedtime.     .Marland Kitchenlosartan (COZAAR) 25 MG tablet Take 25 mg by mouth daily.     . meclizine (ANTIVERT) 25 MG tablet Take 25 mg by mouth 2  (two) times daily as needed for dizziness.     . metFORMIN (GLUCOPHAGE) 1000 MG tablet Take 1,000 mg by mouth 2 (two) times daily with a meal.     . omeprazole (PRILOSEC) 40 MG capsule Take 40 mg by mouth daily.     . ondansetron (ZOFRAN) 8 MG tablet Take 1 tablet (8 mg total) by mouth every 8 (eight) hours as needed for refractory nausea / vomiting. 30 tablet 1  . pioglitazone (ACTOS) 15 MG tablet Take 15 mg by mouth at bedtime.     . prochlorperazine (COMPAZINE) 10 MG tablet Take 1 tablet (10 mg total) by mouth every 6 (six) hours as needed (Nausea or vomiting). 60 tablet 1  . Pyridoxine HCl (VITAMIN B-6 PO) Take 50 mg by mouth 1 day or 1 dose.    . vitamin B-12 (CYANOCOBALAMIN)  1000 MCG tablet Take 1,000 mcg by mouth daily.      No current facility-administered medications for this visit.   Facility-Administered Medications Ordered in Other Visits  Medication Dose Route Frequency Provider Last Rate Last Admin  . sodium chloride flush (NS) 0.9 % injection 10 mL  10 mL Intracatheter Once [name], Payden Bonus, MD        PHYSICAL EXAMINATION: ECOG PERFORMANCE STATUS: 2 - Symptomatic, <50% confined to bed  Vitals:   05/25/20 0836  BP: (!) 150/63  Pulse: 91  Resp: 20  Temp: 99.2 F (37.3 C)  SpO2: 98%   Filed Weights   05/25/20 0836  Weight: 191 lb 6.4 oz (86.8 kg)    GENERAL:alert, no distress and comfortable NEURO: alert & oriented x 3 with fluent speech, no focal motor/sensory deficits  LABORATORY DATA:  I have reviewed the data as listed    Component Value Date/Time   NA 139 05/11/2020 0837   NA 139 06/05/2017 0746   K 4.1 05/11/2020 0837   K 4.1 06/05/2017 0746   CL 105 05/11/2020 0837   CO2 27 05/11/2020 0837   CO2 21 (L) 06/05/2017 0746   GLUCOSE 120 (H) 05/11/2020 0837   GLUCOSE 279 (H) 06/05/2017 0746   BUN 17 05/11/2020 0837   BUN 15.0 06/05/2017 0746   CREATININE 0.72 05/11/2020 0837   CREATININE 0.9 06/05/2017 0746   CALCIUM 8.4 (L) 05/11/2020 0837   CALCIUM 9.1  01/13/2020 0735   CALCIUM 10.1 06/05/2017 0746   PROT 7.4 05/11/2020 0837   PROT 8.1 06/05/2017 0746   ALBUMIN 3.5 05/11/2020 0837   ALBUMIN 4.0 06/05/2017 0746   AST 22 05/11/2020 0837   AST 22 06/05/2017 0746   ALT 16 05/11/2020 0837   ALT 36 06/05/2017 0746   ALKPHOS 80 05/11/2020 0837   ALKPHOS 98 06/05/2017 0746   BILITOT 0.3 05/11/2020 0837   BILITOT 0.44 06/05/2017 0746   GFRNONAA >60 05/11/2020 0837   GFRAA >60 03/30/2020 0734    No results found for: SPEP, UPEP  Lab Results  Component Value Date   WBC 4.0 05/11/2020   NEUTROABS 2.6 05/11/2020   HGB 9.0 (L) 05/11/2020   HCT 28.8 (L) 05/11/2020   MCV 111.6 (H) 05/11/2020   PLT 227 05/11/2020      Chemistry      Component Value Date/Time   NA 139 05/11/2020 0837   NA 139 06/05/2017 0746   K 4.1 05/11/2020 0837   K 4.1 06/05/2017 0746   CL 105 05/11/2020 0837   CO2 27 05/11/2020 0837   CO2 21 (L) 06/05/2017 0746   BUN 17 05/11/2020 0837   BUN 15.0 06/05/2017 0746   CREATININE 0.72 05/11/2020 0837   CREATININE 0.9 06/05/2017 0746      Component Value Date/Time   CALCIUM 8.4 (L) 05/11/2020 0837   CALCIUM 9.1 01/13/2020 0735   CALCIUM 10.1 06/05/2017 0746   ALKPHOS 80 05/11/2020 0837   ALKPHOS 98 06/05/2017 0746   AST 22 05/11/2020 0837   AST 22 06/05/2017 0746   ALT 16 05/11/2020 0837   ALT 36 06/05/2017 0746   BILITOT 0.3 05/11/2020 0837   BILITOT 0.44 06/05/2017 0746       RADIOGRAPHIC STUDIES: I have reviewed multiple imaging studies with the patient and family I have personally reviewed the radiological images as listed and agreed with the findings in the report. CT ABDOMEN PELVIS W CONTRAST  Result Date: 05/25/2020 CLINICAL DATA:  Ovarian cancer.  Restaging. EXAM: CT  ABDOMEN AND PELVIS WITH CONTRAST TECHNIQUE: Multidetector CT imaging of the abdomen and pelvis was performed using the standard protocol following bolus administration of intravenous contrast. CONTRAST:  16m OMNIPAQUE IOHEXOL  300 MG/ML  SOLN COMPARISON:  02/09/2020 FINDINGS: Lower chest: Unremarkable. Hepatobiliary: Interval progression of inferior right liver lesion measuring 2 cm on image 32/2, increased from approximately 5 mm on previous study (remeasured). New tiny capsular 9 mm soft tissue nodule anterior left liver on 20/2. New 9 mm capsular lesion inferior right liver on 40/2. Gallbladder surgically absent. Mild intrahepatic biliary duct dilatation again noted. Stable circumferential enhancement of the wall of the common bile duct. Pancreas: Diffusely fatty replaced. Spleen: No splenomegaly. No focal mass lesion. Adrenals/Urinary Tract: No adrenal nodule or mass. Kidneys unremarkable. No evidence for hydroureter. The urinary bladder appears normal for the degree of distention. Stomach/Bowel: Stomach is decompressed which likely accounts for the apparent wall thickening. Duodenum is normally positioned as is the ligament of Treitz. Duodenal diverticulum noted. No small bowel wall thickening. No small bowel dilatation. No gross colonic mass. No colonic wall thickening. Vascular/Lymphatic: There is abdominal aortic atherosclerosis without aneurysm. There is no gastrohepatic or hepatoduodenal ligament lymphadenopathy. No retroperitoneal or mesenteric lymphadenopathy. No pelvic sidewall lymphadenopathy. Reproductive: Interval progression of soft tissue nodularity along the vaginal cuff. 2.3 cm nodule seen on 69/2 was approximately 1.2 cm previously (remeasured. Adjacent nodules visible on image 68/2 measured 2.7 and 2.5 cm respectively compared to 1.4 and 1.3 cm, respectively, when remeasured on the prior study. Other: Small volume ascites evident in the para colic gutters. Presumed splenule in the left abdomen is similar to prior. Musculoskeletal: Similar appearance of focal enhancement in the region of the umbilicus (750/5. IMPRESSION: 1. Interval progression of soft tissue nodularity in the cul-de-sac, along the vaginal cuff. 2.  Interval progression of inferior right liver lesion with new tiny capsular soft tissue lesions along the left liver and inferior right liver. Imaging features are highly concerning for metastatic disease. 3. Small volume ascites. 4. Similar appearance of focal enhancement in the region of the umbilicus. 5. Aortic Atherosclerosis (ICD10-I70.0). Electronically Signed   By: EMisty StanleyM.D.   On: 05/25/2020 08:14

## 2020-05-25 NOTE — Telephone Encounter (Signed)
Scheduled appts per 11/19 sch msg. Gave pt a print out of AVS. Sent [employer] [facility] a msg about add on for 12/3.

## 2020-05-25 NOTE — Assessment & Plan Note (Signed)
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 The intent is for palliative.  We discussed some of the risks, benefits, side-effects of paclitaxel.   Some of the short term side-effects included, though not limited to, risk of severe allergic reaction, fatigue, weight loss, pancytopenia, life-threatening infections, need for transfusions of blood products, nausea, vomiting, change in bowel habits, loss of hair, admission to hospital for various reasons, and risks of death.   Long term side-effects are also discussed including risks of infertility, permanent damage to nerve function, chronic fatigue, and rare secondary malignancy including bone marrow disorders.   The patient is aware that the response rates discussed earlier is not guaranteed.  After a long discussion, patient made an informed decision to proceed with the prescribed plan of care and went ahead to sign the consent form today.   Patient education material was dispensed. I plan to adjust her chemo cycle with treatment on days 1 and 8, rest day 15 for cycle of every 21 days I will see her prior to day 8 of treatment for toxicity review

## 2020-06-01 ENCOUNTER — Inpatient Hospital Stay: Payer: Medicare Other

## 2020-06-01 ENCOUNTER — Other Ambulatory Visit: Payer: Self-pay

## 2020-06-01 VITALS — BP 129/62 | HR 81 | Temp 98.8°F | Resp 18 | Ht 67.0 in | Wt 188.8 lb

## 2020-06-01 DIAGNOSIS — Z5111 Encounter for antineoplastic chemotherapy: Secondary | ICD-10-CM | POA: Diagnosis not present

## 2020-06-01 DIAGNOSIS — Z7189 Other specified counseling: Secondary | ICD-10-CM

## 2020-06-01 DIAGNOSIS — C561 Malignant neoplasm of right ovary: Secondary | ICD-10-CM

## 2020-06-01 LAB — CMP (CANCER CENTER ONLY)
ALT: 12 U/L (ref 0–44)
AST: 18 U/L (ref 15–41)
Albumin: 3.6 g/dL (ref 3.5–5.0)
Alkaline Phosphatase: 71 U/L (ref 38–126)
Anion gap: 12 (ref 5–15)
BUN: 18 mg/dL (ref 8–23)
CO2: 23 mmol/L (ref 22–32)
Calcium: 9.2 mg/dL (ref 8.9–10.3)
Chloride: 104 mmol/L (ref 98–111)
Creatinine: 0.8 mg/dL (ref 0.44–1.00)
GFR, Estimated: >60 mL/min (ref >60)
Glucose, Bld: 96 mg/dL (ref 70–99)
Potassium: 4.6 mmol/L (ref 3.5–5.1)
Sodium: 139 mmol/L (ref 135–145)
Total Bilirubin: 0.3 mg/dL (ref 0.3–1.2)
Total Protein: 7.7 g/dL (ref 6.5–8.1)

## 2020-06-01 LAB — CBC WITH DIFFERENTIAL (CANCER CENTER ONLY)
Abs Immature Granulocytes: 0.01 10*3/uL (ref 0.00–0.07)
Basophils Absolute: 0.1 10*3/uL (ref 0.0–0.1)
Basophils Relative: 1 %
Eosinophils Absolute: 0.1 10*3/uL (ref 0.0–0.5)
Eosinophils Relative: 2 %
HCT: 32.3 % — ABNORMAL LOW (ref 36.0–46.0)
Hemoglobin: 9.9 g/dL — ABNORMAL LOW (ref 12.0–15.0)
Immature Granulocytes: 0 %
Lymphocytes Relative: 16 %
Lymphs Abs: 0.9 10*3/uL (ref 0.7–4.0)
MCH: 34.1 pg — ABNORMAL HIGH (ref 26.0–34.0)
MCHC: 30.7 g/dL (ref 30.0–36.0)
MCV: 111.4 fL — ABNORMAL HIGH (ref 80.0–100.0)
Monocytes Absolute: 0.5 10*3/uL (ref 0.1–1.0)
Monocytes Relative: 9 %
Neutro Abs: 4 10*3/uL (ref 1.7–7.7)
Neutrophils Relative %: 72 %
Platelet Count: 287 10*3/uL (ref 150–400)
RBC: 2.9 MIL/uL — ABNORMAL LOW (ref 3.87–5.11)
RDW: 14.6 % (ref 11.5–15.5)
WBC Count: 5.5 10*3/uL (ref 4.0–10.5)
nRBC: 0 % (ref 0.0–0.2)

## 2020-06-01 MED ORDER — SODIUM CHLORIDE 0.9 % IV SOLN
20.0000 mg | Freq: Once | INTRAVENOUS | Status: AC
  Administered 2020-06-01: 20 mg via INTRAVENOUS
  Filled 2020-06-01: qty 20

## 2020-06-01 MED ORDER — FAMOTIDINE IN NACL 20-0.9 MG/50ML-% IV SOLN
INTRAVENOUS | Status: AC
  Filled 2020-06-01: qty 50

## 2020-06-01 MED ORDER — SODIUM CHLORIDE 0.9% FLUSH
10.0000 mL | INTRAVENOUS | Status: DC | PRN
  Administered 2020-06-01: 10 mL
  Filled 2020-06-01: qty 10

## 2020-06-01 MED ORDER — DIPHENHYDRAMINE HCL 50 MG/ML IJ SOLN
25.0000 mg | Freq: Once | INTRAMUSCULAR | Status: AC
  Administered 2020-06-01: 25 mg via INTRAVENOUS

## 2020-06-01 MED ORDER — DIPHENHYDRAMINE HCL 50 MG/ML IJ SOLN
INTRAMUSCULAR | Status: AC
  Filled 2020-06-01: qty 1

## 2020-06-01 MED ORDER — SODIUM CHLORIDE 0.9 % IV SOLN
Freq: Once | INTRAVENOUS | Status: AC
  Filled 2020-06-01: qty 250

## 2020-06-01 MED ORDER — FAMOTIDINE IN NACL 20-0.9 MG/50ML-% IV SOLN
20.0000 mg | Freq: Once | INTRAVENOUS | Status: AC
  Administered 2020-06-01: 20 mg via INTRAVENOUS

## 2020-06-01 MED ORDER — SODIUM CHLORIDE 0.9% FLUSH
10.0000 mL | Freq: Once | INTRAVENOUS | Status: AC
  Administered 2020-06-01: 10 mL
  Filled 2020-06-01: qty 10

## 2020-06-01 MED ORDER — SODIUM CHLORIDE 0.9 % IV SOLN
80.0000 mg/m2 | Freq: Once | INTRAVENOUS | Status: AC
  Administered 2020-06-01: 162 mg via INTRAVENOUS
  Filled 2020-06-01: qty 27

## 2020-06-01 MED ORDER — HEPARIN SOD (PORK) LOCK FLUSH 100 UNIT/ML IV SOLN
500.0000 [IU] | Freq: Once | INTRAVENOUS | Status: AC | PRN
  Administered 2020-06-01: 500 [IU]
  Filled 2020-06-01: qty 5

## 2020-06-01 NOTE — Patient Instructions (Signed)
[facility] Discharge Instructions for Patients Receiving Chemotherapy  Today you received the following chemotherapy agents: Taxol.  To help prevent nausea and vomiting after your treatment, we encourage you to take your nausea medication as directed.   If you develop nausea and vomiting that is not controlled by your nausea medication, call the clinic.   BELOW ARE SYMPTOMS THAT SHOULD BE REPORTED IMMEDIATELY:  *FEVER GREATER THAN 100.5 F  *CHILLS WITH OR WITHOUT FEVER  NAUSEA AND VOMITING THAT IS NOT CONTROLLED WITH YOUR NAUSEA MEDICATION  *UNUSUAL SHORTNESS OF BREATH  *UNUSUAL BRUISING OR BLEEDING  TENDERNESS IN MOUTH AND THROAT WITH OR WITHOUT PRESENCE OF ULCERS  *URINARY PROBLEMS  *BOWEL PROBLEMS  UNUSUAL RASH Items with * indicate a potential emergency and should be followed up as soon as possible.  Feel free to call the clinic should you have any questions or concerns. The clinic phone number is [phone].  Please show the CHEMO ALERT CARD at check-in to the Emergency Department and triage nurse.  Paclitaxel injection What is this medicine? PACLITAXEL (PAK li TAX el) is a chemotherapy drug. It targets fast dividing cells, like cancer cells, and causes these cells to die. This medicine is used to treat ovarian cancer, breast cancer, lung cancer, Kaposi's sarcoma, and other cancers. This medicine may be used for other purposes; ask your health care provider or pharmacist if you have questions. COMMON BRAND NAME(S): Onxol, Taxol What should I tell my health care provider before I take this medicine? They need to know if you have any of these conditions:  history of irregular heartbeat  liver disease  low blood counts, like low white cell, platelet, or red cell counts  lung or breathing disease, like asthma  tingling of the fingers or toes, or other nerve disorder  an unusual or allergic reaction to paclitaxel, alcohol, polyoxyethylated castor  oil, other chemotherapy, other medicines, foods, dyes, or preservatives  pregnant or trying to get pregnant  breast-feeding How should I use this medicine? This drug is given as an infusion into a vein. It is administered in a hospital or clinic by a specially trained health care professional. Talk to your pediatrician regarding the use of this medicine in children. Special care may be needed. Overdosage: If you think you have taken too much of this medicine contact a poison control center or emergency room at once. NOTE: This medicine is only for you. Do not share this medicine with others. What if I miss a dose? It is important not to miss your dose. Call your doctor or health care professional if you are unable to keep an appointment. What may interact with this medicine? Do not take this medicine with any of the following medications:  disulfiram  metronidazole This medicine may also interact with the following medications:  antiviral medicines for hepatitis, HIV or AIDS  certain antibiotics like erythromycin and clarithromycin  certain medicines for fungal infections like ketoconazole and itraconazole  certain medicines for seizures like carbamazepine, phenobarbital, phenytoin  gemfibrozil  nefazodone  rifampin  St. John's wort This list may not describe all possible interactions. Give your health care provider a list of all the medicines, herbs, non-prescription drugs, or dietary supplements you use. Also tell them if you smoke, drink alcohol, or use illegal drugs. Some items may interact with your medicine. What should I watch for while using this medicine? Your condition will be monitored carefully while you are receiving this medicine. You will need important blood work   done while you are taking this medicine. This medicine can cause serious allergic reactions. To reduce your risk you will need to take other medicine(s) before treatment with this medicine. If you  experience allergic reactions like skin rash, itching or hives, swelling of the face, lips, or tongue, tell your doctor or health care professional right away. In some cases, you may be given additional medicines to help with side effects. Follow all directions for their use. This drug may make you feel generally unwell. This is not uncommon, as chemotherapy can affect healthy cells as well as cancer cells. Report any side effects. Continue your course of treatment even though you feel ill unless your doctor tells you to stop. Call your doctor or health care professional for advice if you get a fever, chills or sore throat, or other symptoms of a cold or flu. Do not treat yourself. This drug decreases your body's ability to fight infections. Try to avoid being around people who are sick. This medicine may increase your risk to bruise or bleed. Call your doctor or health care professional if you notice any unusual bleeding. Be careful brushing and flossing your teeth or using a toothpick because you may get an infection or bleed more easily. If you have any dental work done, tell your dentist you are receiving this medicine. Avoid taking products that contain aspirin, acetaminophen, ibuprofen, naproxen, or ketoprofen unless instructed by your doctor. These medicines may hide a fever. Do not become pregnant while taking this medicine. Women should inform their doctor if they wish to become pregnant or think they might be pregnant. There is a potential for serious side effects to an unborn child. Talk to your health care professional or pharmacist for more information. Do not breast-feed an infant while taking this medicine. Men are advised not to father a child while receiving this medicine. This product may contain alcohol. Ask your pharmacist or healthcare provider if this medicine contains alcohol. Be sure to tell all healthcare providers you are taking this medicine. Certain medicines, like metronidazole  and disulfiram, can cause an unpleasant reaction when taken with alcohol. The reaction includes flushing, headache, nausea, vomiting, sweating, and increased thirst. The reaction can last from 30 minutes to several hours. What side effects may I notice from receiving this medicine? Side effects that you should report to your doctor or health care professional as soon as possible:  allergic reactions like skin rash, itching or hives, swelling of the face, lips, or tongue  breathing problems  changes in vision  fast, irregular heartbeat  high or low blood pressure  mouth sores  pain, tingling, numbness in the hands or feet  signs of decreased platelets or bleeding - bruising, pinpoint red spots on the skin, black, tarry stools, blood in the urine  signs of decreased red blood cells - unusually weak or tired, feeling faint or lightheaded, falls  signs of infection - fever or chills, cough, sore throat, pain or difficulty passing urine  signs and symptoms of liver injury like dark yellow or brown urine; general ill feeling or flu-like symptoms; light-colored stools; loss of appetite; nausea; right upper belly pain; unusually weak or tired; yellowing of the eyes or skin  swelling of the ankles, feet, hands  unusually slow heartbeat Side effects that usually do not require medical attention (report to your doctor or health care professional if they continue or are bothersome):  diarrhea  hair loss  loss of appetite  muscle or joint pain    nausea, vomiting  pain, redness, or irritation at site where injected  tiredness This list may not describe all possible side effects. Call your doctor for medical advice about side effects. You may report side effects to FDA at 1-800-FDA-1088. Where should I keep my medicine? This drug is given in a hospital or clinic and will not be stored at home. NOTE: This sheet is a summary. It may not cover all possible information. If you have  questions about this medicine, talk to your doctor, pharmacist, or health care provider.  2020 [employer] Standard (2017-02-24 13:14:55)  

## 2020-06-08 ENCOUNTER — Encounter: Payer: Self-pay | Admitting: Hematology and Oncology

## 2020-06-08 ENCOUNTER — Other Ambulatory Visit: Payer: Self-pay

## 2020-06-08 ENCOUNTER — Inpatient Hospital Stay: Payer: Medicare Other

## 2020-06-08 ENCOUNTER — Inpatient Hospital Stay: Payer: Medicare Other | Attending: Gynecology

## 2020-06-08 ENCOUNTER — Inpatient Hospital Stay (HOSPITAL_BASED_OUTPATIENT_CLINIC_OR_DEPARTMENT_OTHER): Payer: Medicare Other | Admitting: Hematology and Oncology

## 2020-06-08 DIAGNOSIS — D61818 Other pancytopenia: Secondary | ICD-10-CM | POA: Diagnosis not present

## 2020-06-08 DIAGNOSIS — Z5111 Encounter for antineoplastic chemotherapy: Secondary | ICD-10-CM | POA: Insufficient documentation

## 2020-06-08 DIAGNOSIS — T451X5A Adverse effect of antineoplastic and immunosuppressive drugs, initial encounter: Secondary | ICD-10-CM

## 2020-06-08 DIAGNOSIS — G620 Drug-induced polyneuropathy: Secondary | ICD-10-CM | POA: Insufficient documentation

## 2020-06-08 DIAGNOSIS — C561 Malignant neoplasm of right ovary: Secondary | ICD-10-CM

## 2020-06-08 DIAGNOSIS — Z79899 Other long term (current) drug therapy: Secondary | ICD-10-CM | POA: Diagnosis not present

## 2020-06-08 DIAGNOSIS — Z7984 Long term (current) use of oral hypoglycemic drugs: Secondary | ICD-10-CM | POA: Diagnosis not present

## 2020-06-08 DIAGNOSIS — Z7189 Other specified counseling: Secondary | ICD-10-CM

## 2020-06-08 DIAGNOSIS — D6481 Anemia due to antineoplastic chemotherapy: Secondary | ICD-10-CM | POA: Diagnosis not present

## 2020-06-08 LAB — CMP (CANCER CENTER ONLY)
ALT: 9 U/L (ref 0–44)
AST: 20 U/L (ref 15–41)
Albumin: 3.5 g/dL (ref 3.5–5.0)
Alkaline Phosphatase: 67 U/L (ref 38–126)
Anion gap: 12 (ref 5–15)
BUN: 19 mg/dL (ref 8–23)
CO2: 23 mmol/L (ref 22–32)
Calcium: 8.8 mg/dL — ABNORMAL LOW (ref 8.9–10.3)
Chloride: 105 mmol/L (ref 98–111)
Creatinine: 0.78 mg/dL (ref 0.44–1.00)
GFR, Estimated: >60 mL/min (ref >60)
Glucose, Bld: 113 mg/dL — ABNORMAL HIGH (ref 70–99)
Potassium: 4 mmol/L (ref 3.5–5.1)
Sodium: 140 mmol/L (ref 135–145)
Total Bilirubin: 0.3 mg/dL (ref 0.3–1.2)
Total Protein: 7.3 g/dL (ref 6.5–8.1)

## 2020-06-08 LAB — CBC WITH DIFFERENTIAL (CANCER CENTER ONLY)
Abs Immature Granulocytes: 0.01 10*3/uL (ref 0.00–0.07)
Basophils Absolute: 0.1 10*3/uL (ref 0.0–0.1)
Basophils Relative: 1 %
Eosinophils Absolute: 0.1 10*3/uL (ref 0.0–0.5)
Eosinophils Relative: 3 %
HCT: 30.3 % — ABNORMAL LOW (ref 36.0–46.0)
Hemoglobin: 9.4 g/dL — ABNORMAL LOW (ref 12.0–15.0)
Immature Granulocytes: 0 %
Lymphocytes Relative: 24 %
Lymphs Abs: 0.9 10*3/uL (ref 0.7–4.0)
MCH: 33.8 pg (ref 26.0–34.0)
MCHC: 31 g/dL (ref 30.0–36.0)
MCV: 109 fL — ABNORMAL HIGH (ref 80.0–100.0)
Monocytes Absolute: 0.3 10*3/uL (ref 0.1–1.0)
Monocytes Relative: 7 %
Neutro Abs: 2.2 10*3/uL (ref 1.7–7.7)
Neutrophils Relative %: 65 %
Platelet Count: 226 10*3/uL (ref 150–400)
RBC: 2.78 MIL/uL — ABNORMAL LOW (ref 3.87–5.11)
RDW: 14 % (ref 11.5–15.5)
WBC Count: 3.5 10*3/uL — ABNORMAL LOW (ref 4.0–10.5)
nRBC: 0 % (ref 0.0–0.2)

## 2020-06-08 MED ORDER — SODIUM CHLORIDE 0.9 % IV SOLN
Freq: Once | INTRAVENOUS | Status: AC
  Filled 2020-06-08: qty 250

## 2020-06-08 MED ORDER — SODIUM CHLORIDE 0.9 % IV SOLN
20.0000 mg | Freq: Once | INTRAVENOUS | Status: AC
  Administered 2020-06-08: 20 mg via INTRAVENOUS
  Filled 2020-06-08: qty 20

## 2020-06-08 MED ORDER — DIPHENHYDRAMINE HCL 50 MG/ML IJ SOLN
25.0000 mg | Freq: Once | INTRAMUSCULAR | Status: AC
  Administered 2020-06-08: 25 mg via INTRAVENOUS

## 2020-06-08 MED ORDER — FAMOTIDINE IN NACL 20-0.9 MG/50ML-% IV SOLN
20.0000 mg | Freq: Once | INTRAVENOUS | Status: AC
  Administered 2020-06-08: 20 mg via INTRAVENOUS

## 2020-06-08 MED ORDER — SODIUM CHLORIDE 0.9 % IV SOLN
80.0000 mg/m2 | Freq: Once | INTRAVENOUS | Status: AC
  Administered 2020-06-08: 162 mg via INTRAVENOUS
  Filled 2020-06-08: qty 27

## 2020-06-08 MED ORDER — HEPARIN SOD (PORK) LOCK FLUSH 100 UNIT/ML IV SOLN
500.0000 [IU] | Freq: Once | INTRAVENOUS | Status: AC | PRN
  Administered 2020-06-08: 500 [IU]
  Filled 2020-06-08: qty 5

## 2020-06-08 MED ORDER — SODIUM CHLORIDE 0.9% FLUSH
10.0000 mL | INTRAVENOUS | Status: DC | PRN
  Administered 2020-06-08: 10 mL
  Filled 2020-06-08: qty 10

## 2020-06-08 MED ORDER — DIPHENHYDRAMINE HCL 50 MG/ML IJ SOLN
INTRAMUSCULAR | Status: AC
  Filled 2020-06-08: qty 1

## 2020-06-08 MED ORDER — FAMOTIDINE IN NACL 20-0.9 MG/50ML-% IV SOLN
INTRAVENOUS | Status: AC
  Filled 2020-06-08: qty 50

## 2020-06-08 NOTE — Assessment & Plan Note (Signed)
So far, she tolerated treatment fairly well without major side effects She is noted to have mild pancytopenia but with adequate blood count to proceed with treatment without delay She has gained some weight We discussed the risk of hyperglycemia and fluid retention with steroids that would be given with each dose I recommend minimum 2 to 3 months of treatment before repeating imaging study

## 2020-06-08 NOTE — Assessment & Plan Note (Signed)
She denies worsening peripheral neuropathy with treatment We will observe carefully while on therapy 

## 2020-06-08 NOTE — Progress Notes (Signed)
[place] OFFICE PROGRESS NOTE  Patient Care Team: Lilian Coma., MD as PCP - General (Internal Medicine)  ASSESSMENT & PLAN:  Ovarian cancer on right [facility]) So far, she tolerated treatment fairly well without major side effects She is noted to have mild pancytopenia but with adequate blood count to proceed with treatment without delay She has gained some weight We discussed the risk of hyperglycemia and fluid retention with steroids that would be given with each dose I recommend minimum 2 to 3 months of treatment before repeating imaging study  Pancytopenia, acquired ([place]) Her counts are stable I have checked serum vitamin B12 and iron studies in February and they are adequate She is not symptomatic and does not need further transfusion today  Peripheral neuropathy due to chemotherapy [facility]) She denies worsening peripheral neuropathy with treatment We will observe carefully while on therapy   No orders of the defined types were placed in this encounter.   All questions were answered. The patient knows to call the clinic with any problems, questions or concerns. The total time spent in the appointment was 20 minutes encounter with patients including review of chart and various tests results, discussions about plan of care and coordination of care plan   Heath Lark, MD 06/08/2020 8:04 AM  INTERVAL HISTORY: Please see below for problem oriented charting. 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    03/20/2017 Imaging   GYN ultrasound was performed this revealed a small fundus measuring 5 cm there are 2 small fibroids within the fundus with the largest measuring 1.3 cm Within the endometrium there is a small amount a fluid the endometrial wall was slightly thickened at 4.8 mm the ultrasound was consistent with a small endometrial polyp  The right adnexa measured 7.4 x 6.7 cm contained a both a cystic and solid mass with a small amount of shadowing This is consistent with a dermoid tumor the left ovary was 2.6 x 1.5 x 1.0 cm There is no other adnexal masses no free fluid within the abdomen no ascites     04/10/2017 Surgery   Operation: Operative Laparscopy with  Peritoneal biopsy 2 hysteroscopy D&C Surgeon: Lynder Parents MD  Specimens: Peritoneal biopsy and cellular washings  Complications: None FIndings Small amount of ascites was found cellular washings were sent There was peritoneal studding And omental caking Even in steep Trendelenburg the ovaries and uterus were unable to be visualized due to the small intestines in the lower pelvis     04/10/2017 Pathology Results   Biopsy from [facility] was nondiagnostic.  Peritoneum biopsy did not reveal malignancy.  Endometrial biopsy also did not reveal malignancy.   04/13/2017 Tumor Marker   Patient's tumor was tested for the following markers: CA-125 Results of the tumor marker test revealed 324.1   04/20/2017 Tumor Marker   Patient's  tumor was tested for the following markers: CA-125 Results of the tumor marker test revealed 345.6   04/22/2017 Pathology Results   Peritoneum, biopsy - CARCINOMA. - SEE COMMENT.   04/22/2017 Procedure   CT-guided core biopsy performed of peritoneal tumor in the left lower quadrant.   04/24/2017  - 09/25/2017 Chemotherapy   She received carboplatin and Taxol x 3 cycles followed by interval surgical debulking on 12/31. Chemotherapy was subsequently resumed for 3 more cycles   06/05/2017 Tumor Marker   Patient's tumor was tested for the following markers: CA-125 Results of the tumor marker test revealed 27.5   06/17/2017 Imaging   1. Considerable improvement in the prior omental caking, which now presents mainly as an indistinct stranding with slight nodularity along the left upper quadrant omentum. 2. Stable appearance of right ovarian mass with fatty and calcific elements classic for a dermoid. 3. There is accentuated enhancement in the walls of the common hepatic duct and common bile duct. A low-grade cholangitis is not readily excluded. 4. Other imaging findings of potential clinical significance: Mild cardiomegaly. Aortic Atherosclerosis (ICD10-I70.0). Lumbar scoliosis, spondylosis, degenerative disc disease, and pars defects at L5. These cause impingement at L5-S1 and lesser impingement at L3-4 and L4-5. 5. 65% compression fracture at L1 slightly worsened than on the prior exam, with associated vertebral sclerosis and posterior retropulsion.   07/06/2017 Surgery   Bilateral Salpingo-Oophorectomy W/Omentectomy, Total Abd Hysterectomy & Radical Dissection For Debulking Right - Ureterolysis, With Or Without Repositioning Of Ureter For Retroperitoneal Fibrosis   07/06/2017 Pathology Results   A: Omentum, omentectomy - Omentum with minimal residual carcinoma, consistent with high grade serous carcinoma (microscopic; stage ypT3a) - Extensive treatment effect with calcifications, fibrosis, histiocytes, and giant cell response - CRS score 3  B: Uterus with cervix and left ovary and fallopian tube, hysterectomy and left salpingo-oophorectomy - Cervix: Ectocervix and endocervix with no dysplasia or malignancy identified - Endometrium: Inactive with no hyperplasia, atypia, or malignancy  identified - Myometrium: Adenomyosis and benign leiomyoma, size 1.2 cm - Serosa: Focal calcification and histiocytes suggestive of treatment effect, with no viable carcinoma identified - Ovary, left: Physiologic changes and no carcinoma identified - Fallopian tube, left: 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 and no malignancy identified  C: Ovary and fallopian tube, right, salpingo-oophorectomy - Right fallopian tube with focal residual carcinoma, consistent with high grade serous carcinoma, and serous tubal intraepithelial carcinoma (STIC) - Associated calcifications and histiocytes suggestive of treatment response - Right ovary with calcifications and histiocytes suggestive of treatment effect, with no definite viable carcinoma identified - Mature cystic teratoma, 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 - See synoptic report and comment   08/14/2017 Tumor Marker   Patient's tumor was tested for the following markers: CA-125 Results of the tumor marker test revealed 14.6   09/04/2017 Tumor Marker   Patient's tumor was tested for the following markers: CA-125 Results of the tumor marker test revealed 12.5   10/03/2017 Genetic Testing   The Common Hereditary Cancer Panel offered by [facility] includes sequencing and/or deletion duplication testing of the following 47 genes: APC, ATM, AXIN2, BARD1, BMPR1A, BRCA1, BRCA2, BRIP1, CDH1, CDKN2A (p14ARF), CDKN2A (p16INK4a), CKD4, CHEK2, CTNNA1, DICER1, EPCAM (Deletion/duplication testing only), GREM1 (promoter region deletion/duplication testing only), KIT, MEN1, MLH1, MSH2, MSH3, MSH6, MUTYH, NBN, NF1, NHTL1, PALB2, PDGFRA, PMS2, POLD1, POLE, PTEN, RAD50, RAD51C, RAD51D, SDHB, SDHC, SDHD, SMAD4, SMARCA4. STK11, TP53, TSC1, TSC2, and VHL.  The following genes were evaluated for sequence changes only: SDHA and HOXB13 c.251G>A variant only.  Results: Negative, no pathogenic variants identified.  The date of this test report is 10/03/2017.    10/28/2017 Imaging   1. Stable  pulmonary nodules. No new or progressive findings. 2. No residual or recurrent omental disease is identified. No obvious peritoneal surface disease. 3. Increasing soft tissue density and ill-defined interstitial changes around the abdominal wall surgical incision. This may be progressive postoperative changes or granulation tissue but attention on future scans to exclude the possibility of tumor. 4. Status post resection of right adnexal mass. No pelvic mass or pelvic adenopathy. 5. Stable T12 compression fracture.   01/25/2018 Tumor Marker   Patient's tumor was tested for the following markers: CA-125 Results of the tumor marker test revealed 9.2   12/10/2018 Tumor Marker   Patient's tumor was tested for the following markers: CA-125 Results of the tumor marker test revealed 212   12/22/2018 Imaging   1. Interval development of multiple soft tissue lesions in the gastrocolic ligament, mesentery, and along the peritoneal surface. These soft tissue lesions measure up to 6.5 cm in size. Associated interval development of moderate volume ascites in the abdomen and pelvis. Imaging features are consistent with metastatic disease. 2. Soft tissue projects through the rectus sheath in the right paraumbilical region, likely herniated small bowel as there is a large amount of decompressed small bowel does deep to this finding. However, metastatic deposit at this location is also possibility.  3. Trace pneumobilia in the left liver suggests prior sphincterotomy. 4.  Aortic Atherosclerois (ICD10-170.0)   12/30/2018 Procedure   Successful ultrasound-guided therapeutic paracentesis yielding 4.5 liters of peritoneal fluid.   12/30/2018 Procedure   Technically successful right IJ power-injectable port catheter placement. Ready for routine use.   12/31/2018 -  Chemotherapy   The patient had carboplatin and taxol for chemotherapy treatment.     01/21/2019 Tumor Marker   Patient's tumor was tested for the  following markers: CA-125 Results of the tumor marker test revealed 408   02/11/2019 Tumor Marker   Patient's tumor was tested for the following markers: CA-125 Results of the tumor marker test revealed 646.   03/02/2019 Imaging   1. Marked improvement in peritoneal carcinomatosis/ascites, without complete resolution. 2.  Aortic atherosclerosis (ICD10-170.0).     03/25/2019 Tumor Marker   Patient's tumor was tested for the following markers: CA-125 Results of the tumor marker test revealed 24.1.   04/15/2019 Tumor Marker   Patient's tumor was tested for the following markers: CA-125 Results of the tumor marker test revealed 15.3   05/18/2019 Imaging   1. There is progressive, nearly complete resolution of peritoneal and omental nodularity seen on prior examination with very faint residual peritoneal and omental haziness (series 2, image 50, 39).   2. Interval resolution of ascites.   3.  Status post hysterectomy and oophorectomy.   4.  Coronary artery disease.  Aortic Atherosclerosis (ICD10-I70.0).      Tumor Marker   Patient's tumor was tested for the following markers: CA-125 Results of the tumor marker test revealed 11.2.   06/10/2019 Tumor Marker   Patient's tumor was tested for the following markers: CA-125 Results of the tumor marker test revealed 9.1   07/29/2019 Tumor Marker   Patient's tumor was tested for the following markers: CA-125 Results of the tumor marker test revealed 8.8   08/18/2019 Imaging   1. No evidence of new or progressive metastatic disease in the abdomen or pelvis. Stable small nodular soft tissue focus at the right umbilicus. No new peritoneal  implants. No ascites. 2. Chronic findings include: Aortic Atherosclerosis (ICD10-I70.0). Coronary atherosclerosis. Chronic moderate T12 vertebral compression fractures.   09/09/2019 Tumor Marker   Patient's tumor was tested for the following markers: CA-125 Results of the tumor marker test revealed 11.4.    10/21/2019 Tumor Marker   Patient's tumor was tested for the following markers: CA-125 Results of the tumor marker test revealed 21.5   10/31/2019 Imaging   1. New hepatic and peritoneal metastatic disease. 2.  Aortic atherosclerosis (ICD10-I70.0).     11/11/2019 -  Chemotherapy   The patient had carboplatin and gemzar for chemotherapy treatment.     11/11/2019 Tumor Marker   Patient's tumor was tested for the following markers: CA-125 Results of the tumor marker test revealed 40.2    Genetic Testing   Patient has genetic testing done for PD-L1. Results revealed patient has the following:  PD-L1 staining in tumor cells (TC): <1% PD-L1 staining in tumor-associated immune cells (IC): 0% PD-L1 combined positive score (CPS): <1%   11/11/2019 - 05/11/2020 Chemotherapy   The patient had carboplatin and gemzar for chemotherapy treatment    12/16/2019 Tumor Marker   Patient's tumor was tested for the following markers: CA-125 Results of the tumor marker test revealed 41.7   01/13/2020 Tumor Marker   Patient's tumor was tested for the following markers: CA-125 Results of the tumor marker test revealed 18.9   02/09/2020 Imaging   1. Positive response to therapy. 2. Hypodense lesions in liver are smaller or no longer measurable. 3. No clear residual peritoneal metastasis. 4. No ascites. 5. Persistent enhancement of the common bile duct suggest biliary inflammation.     02/10/2020 Tumor Marker   Patient's tumor was tested for the following markers: CA-125 Results of the tumor marker test revealed 13.2.   03/19/2020 Tumor Marker   Patient's tumor was tested for the following markers: CA-125 Results of the tumor marker test revealed 15.5   04/13/2020 Tumor Marker   Patient's tumor was tested for the following markers: CA-`125 Results of the tumor marker test revealed 17.9.   05/11/2020 Tumor Marker   Patient's tumor was tested for the following markers: CA-125 Results of the tumor marker  test revealed 20.1.   05/24/2020 Imaging   1. Interval progression of soft tissue nodularity in the cul-de-sac, along the vaginal cuff. 2. Interval progression of inferior right liver lesion with new tiny capsular soft tissue lesions along the left liver and inferior right liver. Imaging features are highly concerning for metastatic disease. 3. Small volume ascites. 4. Similar appearance of focal enhancement in the region of the umbilicus. 5. Aortic Atherosclerosis (ICD10-I70.0).   06/01/2020 -  Chemotherapy   The patient had taxol for chemotherapy treatment.       REVIEW OF SYSTEMS:   Constitutional: Denies fevers, chills or abnormal weight loss Eyes: Denies blurriness of vision Ears, nose, mouth, throat, and face: Denies mucositis or sore throat Respiratory: Denies cough, dyspnea or wheezes Cardiovascular: Denies palpitation, chest discomfort or lower extremity swelling Gastrointestinal:  Denies nausea, heartburn or change in bowel habits Skin: Denies abnormal skin rashes Lymphatics: Denies new lymphadenopathy or easy bruising Behavioral/Psych: Mood is stable, no new changes  All other systems were reviewed with the patient and are negative.  I have reviewed the past medical history, past surgical history, social history and family history with the patient and they are unchanged from previous note.  ALLERGIES:  is allergic to aspirin, nsaids, and tramadol.  MEDICATIONS:  Current Outpatient Medications  Medication  Sig Dispense Refill  . acetaminophen (TYLENOL) 650 MG CR tablet Take 1,300 mg by mouth every 8 (eight) hours.     Marland Kitchen alendronate (FOSAMAX) 70 MG tablet Take 70 mg by mouth every Sunday.     Marland Kitchen atorvastatin (LIPITOR) 80 MG tablet Take 40 mg by mouth at bedtime.     . Cholecalciferol (VITAMIN D3) 2000 units TABS Take 2,000 Units by mouth daily.    . citalopram (CELEXA) 10 MG tablet Take 10 mg by mouth at bedtime    . ferrous sulfate 325 (65 FE) MG tablet Take 325 mg by  mouth daily. 3 hours after meal and 30 min before a meal    . gabapentin (NEURONTIN) 300 MG capsule Take by mouth.    . Glucosamine Sulfate 500 MG TABS Take 500 mg by mouth 2 (two) times daily.     Marland Kitchen lidocaine-prilocaine (EMLA) cream Apply to affected area once 30 g 3  . loratadine (CLARITIN) 10 MG tablet Take 10 mg by mouth at bedtime.     Marland Kitchen losartan (COZAAR) 25 MG tablet Take 25 mg by mouth daily.     . meclizine (ANTIVERT) 25 MG tablet Take 25 mg by mouth 2 (two) times daily as needed for dizziness.     . metFORMIN (GLUCOPHAGE) 1000 MG tablet Take 1,000 mg by mouth 2 (two) times daily with a meal.     . omeprazole (PRILOSEC) 40 MG capsule Take 40 mg by mouth daily.     . ondansetron (ZOFRAN) 8 MG tablet Take 1 tablet (8 mg total) by mouth every 8 (eight) hours as needed for refractory nausea / vomiting. 30 tablet 1  . pioglitazone (ACTOS) 15 MG tablet Take 15 mg by mouth at bedtime.     . prochlorperazine (COMPAZINE) 10 MG tablet Take 1 tablet (10 mg total) by mouth every 6 (six) hours as needed (Nausea or vomiting). 60 tablet 1  . Pyridoxine HCl (VITAMIN B-6 PO) Take 50 mg by mouth 1 day or 1 dose.    . vitamin B-12 (CYANOCOBALAMIN) 1000 MCG tablet Take 1,000 mcg by mouth daily.      No current facility-administered medications for this visit.   Facility-Administered Medications Ordered in Other Visits  Medication Dose Route Frequency Provider Last Rate Last Admin  . sodium chloride flush (NS) 0.9 % injection 10 mL  10 mL Intracatheter Once [name], Herby Amick, MD        PHYSICAL EXAMINATION: ECOG PERFORMANCE STATUS: 1 - Symptomatic but completely ambulatory  Vitals:   06/08/20 0757  BP: 124/62  Pulse: 84  Resp: 18  Temp: 98.8 F (37.1 C)  SpO2: 98%   Filed Weights   06/08/20 0757  Weight: 190 lb 9.6 oz (86.5 kg)    GENERAL:alert, no distress and comfortable SKIN: skin color, texture, turgor are normal, no rashes or significant lesions EYES: normal, Conjunctiva are pink and  non-injected, sclera clear OROPHARYNX:no exudate, no erythema and lips, buccal mucosa, and tongue normal  NECK: supple, thyroid normal size, non-tender, without nodularity LYMPH:  no palpable lymphadenopathy in the cervical, axillary or inguinal LUNGS: clear to auscultation and percussion with normal breathing effort HEART: regular rate & rhythm and no murmurs and no lower extremity edema ABDOMEN:abdomen soft, non-tender and normal bowel sounds Musculoskeletal:no cyanosis of digits and no clubbing  NEURO: alert & oriented x 3 with fluent speech, no focal motor/sensory deficits  LABORATORY DATA:  I have reviewed the data as listed    Component Value Date/Time   NA  139 06/01/2020 1256   NA 139 06/05/2017 0746   K 4.6 06/01/2020 1256   K 4.1 06/05/2017 0746   CL 104 06/01/2020 1256   CO2 23 06/01/2020 1256   CO2 21 (L) 06/05/2017 0746   GLUCOSE 96 06/01/2020 1256   GLUCOSE 279 (H) 06/05/2017 0746   BUN 18 06/01/2020 1256   BUN 15.0 06/05/2017 0746   CREATININE 0.80 06/01/2020 1256   CREATININE 0.9 06/05/2017 0746   CALCIUM 9.2 06/01/2020 1256   CALCIUM 9.1 01/13/2020 0735   CALCIUM 10.1 06/05/2017 0746   PROT 7.7 06/01/2020 1256   PROT 8.1 06/05/2017 0746   ALBUMIN 3.6 06/01/2020 1256   ALBUMIN 4.0 06/05/2017 0746   AST 18 06/01/2020 1256   AST 22 06/05/2017 0746   ALT 12 06/01/2020 1256   ALT 36 06/05/2017 0746   ALKPHOS 71 06/01/2020 1256   ALKPHOS 98 06/05/2017 0746   BILITOT 0.3 06/01/2020 1256   BILITOT 0.44 06/05/2017 0746   GFRNONAA >60 06/01/2020 1256   GFRAA >60 03/30/2020 0734    No results found for: SPEP, UPEP  Lab Results  Component Value Date   WBC 3.5 (L) 06/08/2020   NEUTROABS 2.2 06/08/2020   HGB 9.4 (L) 06/08/2020   HCT 30.3 (L) 06/08/2020   MCV 109.0 (H) 06/08/2020   PLT 226 06/08/2020      Chemistry      Component Value Date/Time   NA 139 06/01/2020 1256   NA 139 06/05/2017 0746   K 4.6 06/01/2020 1256   K 4.1 06/05/2017 0746   CL 104  06/01/2020 1256   CO2 23 06/01/2020 1256   CO2 21 (L) 06/05/2017 0746   BUN 18 06/01/2020 1256   BUN 15.0 06/05/2017 0746   CREATININE 0.80 06/01/2020 1256   CREATININE 0.9 06/05/2017 0746      Component Value Date/Time   CALCIUM 9.2 06/01/2020 1256   CALCIUM 9.1 01/13/2020 0735   CALCIUM 10.1 06/05/2017 0746   ALKPHOS 71 06/01/2020 1256   ALKPHOS 98 06/05/2017 0746   AST 18 06/01/2020 1256   AST 22 06/05/2017 0746   ALT 12 06/01/2020 1256   ALT 36 06/05/2017 0746   BILITOT 0.3 06/01/2020 1256   BILITOT 0.44 06/05/2017 0746

## 2020-06-08 NOTE — Assessment & Plan Note (Signed)
Her counts are stable I have checked serum vitamin B12 and iron studies in February and they are adequate She is not symptomatic and does not need further transfusion today

## 2020-06-08 NOTE — Patient Instructions (Signed)
[facility] Discharge Instructions for Patients Receiving Chemotherapy  Today you received the following chemotherapy agents:  Taxol.  To help prevent nausea and vomiting after your treatment, we encourage you to take your nausea medication as directed.   If you develop nausea and vomiting that is not controlled by your nausea medication, call the clinic.   BELOW ARE SYMPTOMS THAT SHOULD BE REPORTED IMMEDIATELY:  *FEVER GREATER THAN 100.5 F  *CHILLS WITH OR WITHOUT FEVER  NAUSEA AND VOMITING THAT IS NOT CONTROLLED WITH YOUR NAUSEA MEDICATION  *UNUSUAL SHORTNESS OF BREATH  *UNUSUAL BRUISING OR BLEEDING  TENDERNESS IN MOUTH AND THROAT WITH OR WITHOUT PRESENCE OF ULCERS  *URINARY PROBLEMS  *BOWEL PROBLEMS  UNUSUAL RASH Items with * indicate a potential emergency and should be followed up as soon as possible.  Feel free to call the clinic should you have any questions or concerns. The clinic phone number is [phone].  Please show the CHEMO ALERT CARD at check-in to the Emergency Department and triage nurse.   

## 2020-06-09 LAB — CA 125: Cancer Antigen (CA) 125: 26 U/mL (ref 0.0–38.1)

## 2020-06-22 ENCOUNTER — Other Ambulatory Visit: Payer: Self-pay

## 2020-06-22 ENCOUNTER — Inpatient Hospital Stay: Payer: Medicare Other

## 2020-06-22 VITALS — BP 132/75 | HR 90 | Temp 98.9°F | Resp 18

## 2020-06-22 DIAGNOSIS — Z7189 Other specified counseling: Secondary | ICD-10-CM

## 2020-06-22 DIAGNOSIS — C561 Malignant neoplasm of right ovary: Secondary | ICD-10-CM

## 2020-06-22 DIAGNOSIS — Z5111 Encounter for antineoplastic chemotherapy: Secondary | ICD-10-CM | POA: Diagnosis not present

## 2020-06-22 LAB — CMP (CANCER CENTER ONLY)
ALT: 26 U/L (ref 0–44)
AST: 26 U/L (ref 15–41)
Albumin: 3.4 g/dL — ABNORMAL LOW (ref 3.5–5.0)
Alkaline Phosphatase: 89 U/L (ref 38–126)
Anion gap: 9 (ref 5–15)
BUN: 16 mg/dL (ref 8–23)
CO2: 27 mmol/L (ref 22–32)
Calcium: 8.5 mg/dL — ABNORMAL LOW (ref 8.9–10.3)
Chloride: 104 mmol/L (ref 98–111)
Creatinine: 0.77 mg/dL (ref 0.44–1.00)
GFR, Estimated: >60 mL/min (ref >60)
Glucose, Bld: 117 mg/dL — ABNORMAL HIGH (ref 70–99)
Potassium: 4.1 mmol/L (ref 3.5–5.1)
Sodium: 140 mmol/L (ref 135–145)
Total Bilirubin: 0.3 mg/dL (ref 0.3–1.2)
Total Protein: 7.4 g/dL (ref 6.5–8.1)

## 2020-06-22 LAB — CBC WITH DIFFERENTIAL (CANCER CENTER ONLY)
Abs Immature Granulocytes: 0.01 10*3/uL (ref 0.00–0.07)
Basophils Absolute: 0 10*3/uL (ref 0.0–0.1)
Basophils Relative: 1 %
Eosinophils Absolute: 0.1 10*3/uL (ref 0.0–0.5)
Eosinophils Relative: 2 %
HCT: 30.4 % — ABNORMAL LOW (ref 36.0–46.0)
Hemoglobin: 9.5 g/dL — ABNORMAL LOW (ref 12.0–15.0)
Immature Granulocytes: 0 %
Lymphocytes Relative: 21 %
Lymphs Abs: 0.9 10*3/uL (ref 0.7–4.0)
MCH: 33.6 pg (ref 26.0–34.0)
MCHC: 31.3 g/dL (ref 30.0–36.0)
MCV: 107.4 fL — ABNORMAL HIGH (ref 80.0–100.0)
Monocytes Absolute: 0.4 10*3/uL (ref 0.1–1.0)
Monocytes Relative: 9 %
Neutro Abs: 2.8 10*3/uL (ref 1.7–7.7)
Neutrophils Relative %: 67 %
Platelet Count: 228 10*3/uL (ref 150–400)
RBC: 2.83 MIL/uL — ABNORMAL LOW (ref 3.87–5.11)
RDW: 14.5 % (ref 11.5–15.5)
WBC Count: 4.1 10*3/uL (ref 4.0–10.5)
nRBC: 0 % (ref 0.0–0.2)

## 2020-06-22 MED ORDER — SODIUM CHLORIDE 0.9% FLUSH
10.0000 mL | INTRAVENOUS | Status: DC | PRN
  Administered 2020-06-22: 12:00:00 10 mL
  Filled 2020-06-22: qty 10

## 2020-06-22 MED ORDER — SODIUM CHLORIDE 0.9 % IV SOLN
Freq: Once | INTRAVENOUS | Status: AC
  Filled 2020-06-22: qty 250

## 2020-06-22 MED ORDER — DIPHENHYDRAMINE HCL 50 MG/ML IJ SOLN
INTRAMUSCULAR | Status: AC
  Filled 2020-06-22: qty 1

## 2020-06-22 MED ORDER — SODIUM CHLORIDE 0.9 % IV SOLN
20.0000 mg | Freq: Once | INTRAVENOUS | Status: AC
  Administered 2020-06-22: 09:00:00 20 mg via INTRAVENOUS
  Filled 2020-06-22: qty 20

## 2020-06-22 MED ORDER — SODIUM CHLORIDE 0.9 % IV SOLN
80.0000 mg/m2 | Freq: Once | INTRAVENOUS | Status: AC
  Administered 2020-06-22: 11:00:00 162 mg via INTRAVENOUS
  Filled 2020-06-22: qty 27

## 2020-06-22 MED ORDER — FAMOTIDINE IN NACL 20-0.9 MG/50ML-% IV SOLN
INTRAVENOUS | Status: AC
  Filled 2020-06-22: qty 50

## 2020-06-22 MED ORDER — FAMOTIDINE IN NACL 20-0.9 MG/50ML-% IV SOLN
20.0000 mg | Freq: Once | INTRAVENOUS | Status: AC
Start: 2020-06-22 — End: 2020-06-22
  Administered 2020-06-22: 10:00:00 20 mg via INTRAVENOUS

## 2020-06-22 MED ORDER — HEPARIN SOD (PORK) LOCK FLUSH 100 UNIT/ML IV SOLN
500.0000 [IU] | Freq: Once | INTRAVENOUS | Status: AC | PRN
  Administered 2020-06-22: 12:00:00 500 [IU]
  Filled 2020-06-22: qty 5

## 2020-06-22 MED ORDER — DIPHENHYDRAMINE HCL 50 MG/ML IJ SOLN
25.0000 mg | Freq: Once | INTRAMUSCULAR | Status: AC
  Administered 2020-06-22: 10:00:00 25 mg via INTRAVENOUS

## 2020-06-22 NOTE — Patient Instructions (Signed)
[facility] Discharge Instructions for Patients Receiving Chemotherapy  Today you received the following chemotherapy agents:  Taxol.  To help prevent nausea and vomiting after your treatment, we encourage you to take your nausea medication as directed.   If you develop nausea and vomiting that is not controlled by your nausea medication, call the clinic.   BELOW ARE SYMPTOMS THAT SHOULD BE REPORTED IMMEDIATELY:  *FEVER GREATER THAN 100.5 F  *CHILLS WITH OR WITHOUT FEVER  NAUSEA AND VOMITING THAT IS NOT CONTROLLED WITH YOUR NAUSEA MEDICATION  *UNUSUAL SHORTNESS OF BREATH  *UNUSUAL BRUISING OR BLEEDING  TENDERNESS IN MOUTH AND THROAT WITH OR WITHOUT PRESENCE OF ULCERS  *URINARY PROBLEMS  *BOWEL PROBLEMS  UNUSUAL RASH Items with * indicate a potential emergency and should be followed up as soon as possible.  Feel free to call the clinic should you have any questions or concerns. The clinic phone number is [phone].  Please show the CHEMO ALERT CARD at check-in to the Emergency Department and triage nurse.   

## 2020-07-02 ENCOUNTER — Inpatient Hospital Stay: Payer: Medicare Other

## 2020-07-02 ENCOUNTER — Encounter: Payer: Self-pay | Admitting: Hematology and Oncology

## 2020-07-02 ENCOUNTER — Inpatient Hospital Stay (HOSPITAL_BASED_OUTPATIENT_CLINIC_OR_DEPARTMENT_OTHER): Payer: Medicare Other | Admitting: Hematology and Oncology

## 2020-07-02 ENCOUNTER — Telehealth: Payer: Self-pay | Admitting: Hematology and Oncology

## 2020-07-02 ENCOUNTER — Other Ambulatory Visit: Payer: Self-pay

## 2020-07-02 DIAGNOSIS — D6481 Anemia due to antineoplastic chemotherapy: Secondary | ICD-10-CM

## 2020-07-02 DIAGNOSIS — T451X5A Adverse effect of antineoplastic and immunosuppressive drugs, initial encounter: Secondary | ICD-10-CM

## 2020-07-02 DIAGNOSIS — C561 Malignant neoplasm of right ovary: Secondary | ICD-10-CM

## 2020-07-02 DIAGNOSIS — Z7189 Other specified counseling: Secondary | ICD-10-CM

## 2020-07-02 DIAGNOSIS — G620 Drug-induced polyneuropathy: Secondary | ICD-10-CM | POA: Diagnosis not present

## 2020-07-02 DIAGNOSIS — Z5111 Encounter for antineoplastic chemotherapy: Secondary | ICD-10-CM | POA: Diagnosis not present

## 2020-07-02 LAB — CMP (CANCER CENTER ONLY)
ALT: 26 U/L (ref 0–44)
AST: 26 U/L (ref 15–41)
Albumin: 3.7 g/dL (ref 3.5–5.0)
Alkaline Phosphatase: 69 U/L (ref 38–126)
Anion gap: 12 (ref 5–15)
BUN: 19 mg/dL (ref 8–23)
CO2: 24 mmol/L (ref 22–32)
Calcium: 9 mg/dL (ref 8.9–10.3)
Chloride: 103 mmol/L (ref 98–111)
Creatinine: 0.8 mg/dL (ref 0.44–1.00)
GFR, Estimated: >60 mL/min (ref >60)
Glucose, Bld: 115 mg/dL — ABNORMAL HIGH (ref 70–99)
Potassium: 4.2 mmol/L (ref 3.5–5.1)
Sodium: 139 mmol/L (ref 135–145)
Total Bilirubin: 0.4 mg/dL (ref 0.3–1.2)
Total Protein: 7.3 g/dL (ref 6.5–8.1)

## 2020-07-02 LAB — CBC WITH DIFFERENTIAL (CANCER CENTER ONLY)
Abs Immature Granulocytes: 0.02 10*3/uL (ref 0.00–0.07)
Basophils Absolute: 0 10*3/uL (ref 0.0–0.1)
Basophils Relative: 0 %
Eosinophils Absolute: 0.1 10*3/uL (ref 0.0–0.5)
Eosinophils Relative: 1 %
HCT: 30.6 % — ABNORMAL LOW (ref 36.0–46.0)
Hemoglobin: 9.5 g/dL — ABNORMAL LOW (ref 12.0–15.0)
Immature Granulocytes: 0 %
Lymphocytes Relative: 11 %
Lymphs Abs: 0.7 10*3/uL (ref 0.7–4.0)
MCH: 33.3 pg (ref 26.0–34.0)
MCHC: 31 g/dL (ref 30.0–36.0)
MCV: 107.4 fL — ABNORMAL HIGH (ref 80.0–100.0)
Monocytes Absolute: 0.5 10*3/uL (ref 0.1–1.0)
Monocytes Relative: 8 %
Neutro Abs: 4.5 10*3/uL (ref 1.7–7.7)
Neutrophils Relative %: 80 %
Platelet Count: 259 10*3/uL (ref 150–400)
RBC: 2.85 MIL/uL — ABNORMAL LOW (ref 3.87–5.11)
RDW: 14.6 % (ref 11.5–15.5)
WBC Count: 5.7 10*3/uL (ref 4.0–10.5)
nRBC: 0 % (ref 0.0–0.2)

## 2020-07-02 MED ORDER — SODIUM CHLORIDE 0.9 % IV SOLN
80.0000 mg/m2 | Freq: Once | INTRAVENOUS | Status: AC
  Administered 2020-07-02: 11:00:00 162 mg via INTRAVENOUS
  Filled 2020-07-02: qty 27

## 2020-07-02 MED ORDER — SODIUM CHLORIDE 0.9 % IV SOLN
20.0000 mg | Freq: Once | INTRAVENOUS | Status: AC
  Administered 2020-07-02: 10:00:00 20 mg via INTRAVENOUS
  Filled 2020-07-02: qty 20

## 2020-07-02 MED ORDER — HEPARIN SOD (PORK) LOCK FLUSH 100 UNIT/ML IV SOLN
500.0000 [IU] | Freq: Once | INTRAVENOUS | Status: AC | PRN
  Administered 2020-07-02: 12:00:00 500 [IU]
  Filled 2020-07-02: qty 5

## 2020-07-02 MED ORDER — FAMOTIDINE IN NACL 20-0.9 MG/50ML-% IV SOLN
INTRAVENOUS | Status: AC
  Filled 2020-07-02: qty 50

## 2020-07-02 MED ORDER — SODIUM CHLORIDE 0.9 % IV SOLN
Freq: Once | INTRAVENOUS | Status: AC
  Filled 2020-07-02: qty 250

## 2020-07-02 MED ORDER — SODIUM CHLORIDE 0.9% FLUSH
10.0000 mL | INTRAVENOUS | Status: DC | PRN
  Administered 2020-07-02: 12:00:00 10 mL
  Filled 2020-07-02: qty 10

## 2020-07-02 MED ORDER — DIPHENHYDRAMINE HCL 50 MG/ML IJ SOLN
25.0000 mg | Freq: Once | INTRAMUSCULAR | Status: AC
  Administered 2020-07-02: 10:00:00 25 mg via INTRAVENOUS

## 2020-07-02 MED ORDER — SODIUM CHLORIDE 0.9% FLUSH
10.0000 mL | Freq: Once | INTRAVENOUS | Status: AC
  Administered 2020-07-02: 08:00:00 10 mL
  Filled 2020-07-02: qty 10

## 2020-07-02 MED ORDER — DIPHENHYDRAMINE HCL 50 MG/ML IJ SOLN
INTRAMUSCULAR | Status: AC
  Filled 2020-07-02: qty 1

## 2020-07-02 MED ORDER — FAMOTIDINE IN NACL 20-0.9 MG/50ML-% IV SOLN
20.0000 mg | Freq: Once | INTRAVENOUS | Status: AC
  Administered 2020-07-02: 10:00:00 20 mg via INTRAVENOUS

## 2020-07-02 NOTE — Assessment & Plan Note (Signed)

## 2020-07-02 NOTE — Assessment & Plan Note (Signed)
She denies worsening peripheral neuropathy with treatment We will observe carefully while on therapy 

## 2020-07-02 NOTE — Telephone Encounter (Signed)
Scheduled appointments per 12/27 sch msg. Spoke to patient who is aware of appointments dates and times. Gave patient calendar print out.

## 2020-07-02 NOTE — Progress Notes (Signed)
[place] OFFICE PROGRESS NOTE  Patient Care Team: Lilian Coma., MD as PCP - General (Internal Medicine)  ASSESSMENT & PLAN:  Ovarian cancer on right [facility]) So far, she tolerated treatment fairly well without major side effects She is noted to have mild pancytopenia but with adequate blood count to proceed with treatment without delay -Due to some planned time off, I would adjust the treatment days a little bit I recommend minimum 2 to 3 months of treatment before repeating imaging study, probably sometime around mid to end of February 2022  Anemia due to antineoplastic chemotherapy This is likely due to recent treatment. The patient denies recent history of bleeding such as epistaxis, hematuria or hematochezia. She is asymptomatic from the anemia. I will observe for now.  She does not require transfusion now. I will continue the chemotherapy at current dose without dosage adjustment.  If the anemia gets progressive worse in the future, I might have to delay her treatment or adjust the chemotherapy dose.   Peripheral neuropathy due to chemotherapy [facility]) She denies worsening peripheral neuropathy with treatment We will observe carefully while on therapy   No orders of the defined types were placed in this encounter.   All questions were answered. The patient knows to call the clinic with any problems, questions or concerns. The total time spent in the appointment was 20 minutes encounter with patients including review of chart and various tests results, discussions about plan of care and coordination of care plan   Heath Lark, MD 07/02/2020 9:01 AM  INTERVAL HISTORY: Please see below for problem oriented charting. 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    03/20/2017 Imaging   GYN ultrasound was performed this revealed a small fundus measuring 5 cm there are 2 small fibroids within the fundus with the largest measuring 1.3 cm Within the endometrium there is a small amount a fluid the endometrial wall was slightly thickened at 4.8 mm the ultrasound was consistent with a small endometrial polyp  The right adnexa measured 7.4 x 6.7 cm contained a both a cystic and solid mass with a small amount of shadowing This is consistent with a dermoid tumor the left ovary was 2.6 x 1.5 x 1.0 cm There is no other adnexal masses no free fluid within the abdomen no ascites     04/10/2017 Surgery   Operation: Operative Laparscopy with  Peritoneal biopsy 2 hysteroscopy D&C Surgeon: Lynder Parents MD  Specimens: Peritoneal biopsy and cellular washings  Complications: None FIndings Small amount of ascites was found cellular washings were sent There was peritoneal studding And omental caking Even in steep Trendelenburg the ovaries and uterus were unable to be visualized due to the small intestines in the lower pelvis     04/10/2017 Pathology Results   Biopsy from [facility] was nondiagnostic.  Peritoneum  biopsy did not reveal malignancy.  Endometrial biopsy also did not reveal malignancy.   04/13/2017 Tumor Marker   Patient's tumor was tested for the following markers: CA-125 Results of the tumor marker test revealed 324.1   04/20/2017 Tumor Marker   Patient's tumor was tested for the  following markers: CA-125 Results of the tumor marker test revealed 345.6   04/22/2017 Pathology Results   Peritoneum, biopsy - CARCINOMA. - SEE COMMENT.   04/22/2017 Procedure   CT-guided core biopsy performed of peritoneal tumor in the left lower quadrant.   04/24/2017 - 09/25/2017 Chemotherapy   She received carboplatin and Taxol x 3 cycles followed by interval surgical debulking on 12/31. Chemotherapy was subsequently resumed for 3 more cycles   06/05/2017 Tumor Marker   Patient's tumor was tested for the following markers: CA-125 Results of the tumor marker test revealed 27.5   06/17/2017 Imaging   1. Considerable improvement in the prior omental caking, which now presents mainly as an indistinct stranding with slight nodularity along the left upper quadrant omentum. 2. Stable appearance of right ovarian mass with fatty and calcific elements classic for a dermoid. 3. There is accentuated enhancement in the walls of the common hepatic duct and common bile duct. A low-grade cholangitis is not readily excluded. 4. Other imaging findings of potential clinical significance: Mild cardiomegaly. Aortic Atherosclerosis (ICD10-I70.0). Lumbar scoliosis, spondylosis, degenerative disc disease, and pars defects at L5. These cause impingement at L5-S1 and lesser impingement at L3-4 and L4-5. 5. 65% compression fracture at L1 slightly worsened than on the prior exam, with associated vertebral sclerosis and posterior retropulsion.   07/06/2017 Surgery   Bilateral Salpingo-Oophorectomy W/Omentectomy, Total Abd Hysterectomy & Radical Dissection For Debulking Right - Ureterolysis, With Or Without Repositioning Of Ureter For Retroperitoneal Fibrosis   07/06/2017 Pathology Results   A: Omentum, omentectomy - Omentum with minimal residual carcinoma, consistent with high grade serous carcinoma (microscopic; stage ypT3a) - Extensive treatment effect with calcifications, fibrosis, histiocytes, and giant  cell response - CRS score 3  B: Uterus with cervix and left ovary and fallopian tube, hysterectomy and left salpingo-oophorectomy - Cervix: Ectocervix and endocervix with no dysplasia or malignancy identified - Endometrium: Inactive with no hyperplasia, atypia, or malignancy identified - Myometrium: Adenomyosis and benign leiomyoma, size 1.2 cm - Serosa: Focal calcification and histiocytes suggestive of treatment effect, with no viable carcinoma identified - Ovary, left: Physiologic changes and no carcinoma identified - Fallopian tube, left: 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 and no malignancy identified  C: Ovary and fallopian tube, right, salpingo-oophorectomy - Right fallopian tube with focal residual carcinoma, consistent with high grade serous carcinoma, and serous tubal intraepithelial carcinoma (STIC) - Associated calcifications and histiocytes suggestive of treatment response - Right ovary with calcifications and histiocytes suggestive of treatment effect, with no definite viable carcinoma identified - Mature cystic teratoma, 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 - See synoptic report and comment   08/14/2017 Tumor Marker   Patient's tumor was tested for the following markers: CA-125 Results of the tumor marker test revealed 14.6   09/04/2017 Tumor Marker   Patient's tumor was tested for the following markers: CA-125 Results of the tumor marker test revealed 12.5   10/03/2017 Genetic Testing   The Common Hereditary Cancer Panel offered by [facility] includes sequencing and/or deletion duplication testing of the following 47 genes: APC, ATM, AXIN2, BARD1, BMPR1A, BRCA1, BRCA2, BRIP1, CDH1, CDKN2A (p14ARF), CDKN2A (p16INK4a), CKD4, CHEK2, CTNNA1, DICER1, EPCAM (Deletion/duplication testing only), GREM1 (promoter region deletion/duplication testing only), KIT,  MEN1, MLH1, MSH2, MSH3, MSH6, MUTYH, NBN, NF1, NHTL1, PALB2, PDGFRA, PMS2, POLD1, POLE, PTEN, RAD50, RAD51C, RAD51D, SDHB, SDHC, SDHD, SMAD4,  SMARCA4. STK11, TP53, TSC1, TSC2, and VHL.  The following genes were evaluated for sequence changes only: SDHA and HOXB13 c.251G>A variant only.  Results: Negative, no pathogenic variants identified.  The date of this test report is 10/03/2017.    10/28/2017 Imaging   1. Stable pulmonary nodules. No new or progressive findings. 2. No residual or recurrent omental disease is identified. No obvious peritoneal surface disease. 3. Increasing soft tissue density and ill-defined interstitial changes around the abdominal wall surgical incision. This may be progressive postoperative changes or granulation tissue but attention on future scans to exclude the possibility of tumor. 4. Status post resection of right adnexal mass. No pelvic mass or pelvic adenopathy. 5. Stable T12 compression fracture.   01/25/2018 Tumor Marker   Patient's tumor was tested for the following markers: CA-125 Results of the tumor marker test revealed 9.2   12/10/2018 Tumor Marker   Patient's tumor was tested for the following markers: CA-125 Results of the tumor marker test revealed 212   12/22/2018 Imaging   1. Interval development of multiple soft tissue lesions in the gastrocolic ligament, mesentery, and along the peritoneal surface. These soft tissue lesions measure up to 6.5 cm in size. Associated interval development of moderate volume ascites in the abdomen and pelvis. Imaging features are consistent with metastatic disease. 2. Soft tissue projects through the rectus sheath in the right paraumbilical region, likely herniated small bowel as there is a large amount of decompressed small bowel does deep to this finding. However, metastatic deposit at this location is also possibility.  3. Trace pneumobilia in the left liver suggests prior sphincterotomy. 4.  Aortic Atherosclerois (ICD10-170.0)   12/30/2018 Procedure   Successful ultrasound-guided therapeutic paracentesis yielding 4.5 liters of peritoneal fluid.   12/30/2018  Procedure   Technically successful right IJ power-injectable port catheter placement. Ready for routine use.   12/31/2018 -  Chemotherapy   The patient had carboplatin and taxol for chemotherapy treatment.     01/21/2019 Tumor Marker   Patient's tumor was tested for the following markers: CA-125 Results of the tumor marker test revealed 408   02/11/2019 Tumor Marker   Patient's tumor was tested for the following markers: CA-125 Results of the tumor marker test revealed 646.   03/02/2019 Imaging   1. Marked improvement in peritoneal carcinomatosis/ascites, without complete resolution. 2.  Aortic atherosclerosis (ICD10-170.0).     03/25/2019 Tumor Marker   Patient's tumor was tested for the following markers: CA-125 Results of the tumor marker test revealed 24.1.   04/15/2019 Tumor Marker   Patient's tumor was tested for the following markers: CA-125 Results of the tumor marker test revealed 15.3   05/18/2019 Imaging   1. There is progressive, nearly complete resolution of peritoneal and omental nodularity seen on prior examination with very faint residual peritoneal and omental haziness (series 2, image 50, 39).   2. Interval resolution of ascites.   3.  Status post hysterectomy and oophorectomy.   4.  Coronary artery disease.  Aortic Atherosclerosis (ICD10-I70.0).      Tumor Marker   Patient's tumor was tested for the following markers: CA-125 Results of the tumor marker test revealed 11.2.   06/10/2019 Tumor Marker   Patient's tumor was tested for the following markers: CA-125 Results of the tumor marker test revealed 9.1   07/29/2019 Tumor Marker   Patient's tumor was tested  for the following markers: CA-125 Results of the tumor marker test revealed 8.8   08/18/2019 Imaging   1. No evidence of new or progressive metastatic disease in the abdomen or pelvis. Stable small nodular soft tissue focus at the right umbilicus. No new peritoneal implants. No ascites. 2. Chronic  findings include: Aortic Atherosclerosis (ICD10-I70.0). Coronary atherosclerosis. Chronic moderate T12 vertebral compression fractures.   09/09/2019 Tumor Marker   Patient's tumor was tested for the following markers: CA-125 Results of the tumor marker test revealed 11.4.   10/21/2019 Tumor Marker   Patient's tumor was tested for the following markers: CA-125 Results of the tumor marker test revealed 21.5   10/31/2019 Imaging   1. New hepatic and peritoneal metastatic disease. 2.  Aortic atherosclerosis (ICD10-I70.0).     11/11/2019 -  Chemotherapy   The patient had carboplatin and gemzar for chemotherapy treatment.     11/11/2019 Tumor Marker   Patient's tumor was tested for the following markers: CA-125 Results of the tumor marker test revealed 40.2    Genetic Testing   Patient has genetic testing done for PD-L1. Results revealed patient has the following:  PD-L1 staining in tumor cells (TC): <1% PD-L1 staining in tumor-associated immune cells (IC): 0% PD-L1 combined positive score (CPS): <1%   11/11/2019 - 05/11/2020 Chemotherapy   The patient had carboplatin and gemzar for chemotherapy treatment    12/16/2019 Tumor Marker   Patient's tumor was tested for the following markers: CA-125 Results of the tumor marker test revealed 41.7   01/13/2020 Tumor Marker   Patient's tumor was tested for the following markers: CA-125 Results of the tumor marker test revealed 18.9   02/09/2020 Imaging   1. Positive response to therapy. 2. Hypodense lesions in liver are smaller or no longer measurable. 3. No clear residual peritoneal metastasis. 4. No ascites. 5. Persistent enhancement of the common bile duct suggest biliary inflammation.     02/10/2020 Tumor Marker   Patient's tumor was tested for the following markers: CA-125 Results of the tumor marker test revealed 13.2.   03/19/2020 Tumor Marker   Patient's tumor was tested for the following markers: CA-125 Results of the tumor marker  test revealed 15.5   04/13/2020 Tumor Marker   Patient's tumor was tested for the following markers: CA-`125 Results of the tumor marker test revealed 17.9.   05/11/2020 Tumor Marker   Patient's tumor was tested for the following markers: CA-125 Results of the tumor marker test revealed 20.1.   05/24/2020 Imaging   1. Interval progression of soft tissue nodularity in the cul-de-sac, along the vaginal cuff. 2. Interval progression of inferior right liver lesion with new tiny capsular soft tissue lesions along the left liver and inferior right liver. Imaging features are highly concerning for metastatic disease. 3. Small volume ascites. 4. Similar appearance of focal enhancement in the region of the umbilicus. 5. Aortic Atherosclerosis (ICD10-I70.0).   06/01/2020 -  Chemotherapy   The patient had taxol for chemotherapy treatment.     06/08/2020 Tumor Marker   Patient's tumor was tested for the following markers: CA-125 Results of the tumor marker test revealed 26     REVIEW OF SYSTEMS:   Constitutional: Denies fevers, chills or abnormal weight loss Eyes: Denies blurriness of vision Ears, nose, mouth, throat, and face: Denies mucositis or sore throat Respiratory: Denies cough, dyspnea or wheezes Cardiovascular: Denies palpitation, chest discomfort or lower extremity swelling Gastrointestinal:  Denies nausea, heartburn or change in bowel habits Skin: Denies abnormal skin rashes  Lymphatics: Denies new lymphadenopathy or easy bruising Behavioral/Psych: Mood is stable, no new changes  All other systems were reviewed with the patient and are negative.  I have reviewed the past medical history, past surgical history, social history and family history with the patient and they are unchanged from previous note.  ALLERGIES:  is allergic to aspirin, nsaids, and tramadol.  MEDICATIONS:  Current Outpatient Medications  Medication Sig Dispense Refill  . acetaminophen (TYLENOL) 650 MG CR  tablet Take 1,300 mg by mouth every 8 (eight) hours.     Marland Kitchen alendronate (FOSAMAX) 70 MG tablet Take 70 mg by mouth every Sunday.     Marland Kitchen atorvastatin (LIPITOR) 80 MG tablet Take 40 mg by mouth at bedtime.     . Cholecalciferol (VITAMIN D3) 2000 units TABS Take 2,000 Units by mouth daily.    . citalopram (CELEXA) 10 MG tablet Take 10 mg by mouth at bedtime    . ferrous sulfate 325 (65 FE) MG tablet Take 325 mg by mouth daily. 3 hours after meal and 30 min before a meal    . gabapentin (NEURONTIN) 300 MG capsule Take by mouth.    . Glucosamine Sulfate 500 MG TABS Take 500 mg by mouth 2 (two) times daily.     Marland Kitchen lidocaine-prilocaine (EMLA) cream Apply to affected area once 30 g 3  . loratadine (CLARITIN) 10 MG tablet Take 10 mg by mouth at bedtime.     Marland Kitchen losartan (COZAAR) 25 MG tablet Take 25 mg by mouth daily.     . meclizine (ANTIVERT) 25 MG tablet Take 25 mg by mouth 2 (two) times daily as needed for dizziness.     . metFORMIN (GLUCOPHAGE) 1000 MG tablet Take 1,000 mg by mouth 2 (two) times daily with a meal.     . omeprazole (PRILOSEC) 40 MG capsule Take 40 mg by mouth daily.     . ondansetron (ZOFRAN) 8 MG tablet Take 1 tablet (8 mg total) by mouth every 8 (eight) hours as needed for refractory nausea / vomiting. 30 tablet 1  . pioglitazone (ACTOS) 15 MG tablet Take 15 mg by mouth at bedtime.     . prochlorperazine (COMPAZINE) 10 MG tablet Take 1 tablet (10 mg total) by mouth every 6 (six) hours as needed (Nausea or vomiting). 60 tablet 1  . Pyridoxine HCl (VITAMIN B-6 PO) Take 50 mg by mouth 1 day or 1 dose.    . vitamin B-12 (CYANOCOBALAMIN) 1000 MCG tablet Take 1,000 mcg by mouth daily.      No current facility-administered medications for this visit.   Facility-Administered Medications Ordered in Other Visits  Medication Dose Route Frequency Provider Last Rate Last Admin  . sodium chloride flush (NS) 0.9 % injection 10 mL  10 mL Intracatheter Once [name], Kalila Adkison, MD        PHYSICAL  EXAMINATION: ECOG PERFORMANCE STATUS: 1 - Symptomatic but completely ambulatory  Vitals:   07/02/20 0837  BP: (!) 111/56  Pulse: 85  Resp: 18  Temp: 98.7 F (37.1 C)  SpO2: 97%   Filed Weights   07/02/20 0837  Weight: 186 lb 9.6 oz (84.6 kg)    GENERAL:alert, no distress and comfortable SKIN: skin color, texture, turgor are normal, no rashes or significant lesions EYES: normal, Conjunctiva are pink and non-injected, sclera clear OROPHARYNX:no exudate, no erythema and lips, buccal mucosa, and tongue normal  NECK: supple, thyroid normal size, non-tender, without nodularity LYMPH:  no palpable lymphadenopathy in the cervical, axillary or inguinal  LUNGS: clear to auscultation and percussion with normal breathing effort HEART: regular rate & rhythm and no murmurs and no lower extremity edema ABDOMEN:abdomen soft, non-tender and normal bowel sounds Musculoskeletal:no cyanosis of digits and no clubbing  NEURO: alert & oriented x 3 with fluent speech, no focal motor/sensory deficits  LABORATORY DATA:  I have reviewed the data as listed    Component Value Date/Time   NA 140 06/22/2020 0805   NA 139 06/05/2017 0746   K 4.1 06/22/2020 0805   K 4.1 06/05/2017 0746   CL 104 06/22/2020 0805   CO2 27 06/22/2020 0805   CO2 21 (L) 06/05/2017 0746   GLUCOSE 117 (H) 06/22/2020 0805   GLUCOSE 279 (H) 06/05/2017 0746   BUN 16 06/22/2020 0805   BUN 15.0 06/05/2017 0746   CREATININE 0.77 06/22/2020 0805   CREATININE 0.9 06/05/2017 0746   CALCIUM 8.5 (L) 06/22/2020 0805   CALCIUM 9.1 01/13/2020 0735   CALCIUM 10.1 06/05/2017 0746   PROT 7.4 06/22/2020 0805   PROT 8.1 06/05/2017 0746   ALBUMIN 3.4 (L) 06/22/2020 0805   ALBUMIN 4.0 06/05/2017 0746   AST 26 06/22/2020 0805   AST 22 06/05/2017 0746   ALT 26 06/22/2020 0805   ALT 36 06/05/2017 0746   ALKPHOS 89 06/22/2020 0805   ALKPHOS 98 06/05/2017 0746   BILITOT 0.3 06/22/2020 0805   BILITOT 0.44 06/05/2017 0746   GFRNONAA >60  06/22/2020 0805   GFRAA >60 03/30/2020 0734    No results found for: SPEP, UPEP  Lab Results  Component Value Date   WBC 5.7 07/02/2020   NEUTROABS 4.5 07/02/2020   HGB 9.5 (L) 07/02/2020   HCT 30.6 (L) 07/02/2020   MCV 107.4 (H) 07/02/2020   PLT 259 07/02/2020      Chemistry      Component Value Date/Time   NA 140 06/22/2020 0805   NA 139 06/05/2017 0746   K 4.1 06/22/2020 0805   K 4.1 06/05/2017 0746   CL 104 06/22/2020 0805   CO2 27 06/22/2020 0805   CO2 21 (L) 06/05/2017 0746   BUN 16 06/22/2020 0805   BUN 15.0 06/05/2017 0746   CREATININE 0.77 06/22/2020 0805   CREATININE 0.9 06/05/2017 0746      Component Value Date/Time   CALCIUM 8.5 (L) 06/22/2020 0805   CALCIUM 9.1 01/13/2020 0735   CALCIUM 10.1 06/05/2017 0746   ALKPHOS 89 06/22/2020 0805   ALKPHOS 98 06/05/2017 0746   AST 26 06/22/2020 0805   AST 22 06/05/2017 0746   ALT 26 06/22/2020 0805   ALT 36 06/05/2017 0746   BILITOT 0.3 06/22/2020 0805   BILITOT 0.44 06/05/2017 0746

## 2020-07-02 NOTE — Patient Instructions (Signed)
[facility] Discharge Instructions for Patients Receiving Chemotherapy  Today you received the following chemotherapy agents Paclitaxel (TAXOL).  To help prevent nausea and vomiting after your treatment, we encourage you to take your nausea medication as prescribed.  If you develop nausea and vomiting that is not controlled by your nausea medication, call the clinic.   BELOW ARE SYMPTOMS THAT SHOULD BE REPORTED IMMEDIATELY:  *FEVER GREATER THAN 100.5 F  *CHILLS WITH OR WITHOUT FEVER  NAUSEA AND VOMITING THAT IS NOT CONTROLLED WITH YOUR NAUSEA MEDICATION  *UNUSUAL SHORTNESS OF BREATH  *UNUSUAL BRUISING OR BLEEDING  TENDERNESS IN MOUTH AND THROAT WITH OR WITHOUT PRESENCE OF ULCERS  *URINARY PROBLEMS  *BOWEL PROBLEMS  UNUSUAL RASH Items with * indicate a potential emergency and should be followed up as soon as possible.  Feel free to call the clinic should you have any questions or concerns. The clinic phone number is [phone].  Please show the CHEMO ALERT CARD at check-in to the Emergency Department and triage nurse.   

## 2020-07-02 NOTE — Assessment & Plan Note (Signed)
So far, she tolerated treatment fairly well without major side effects She is noted to have mild pancytopenia but with adequate blood count to proceed with treatment without delay -Due to some planned time off, I would adjust the treatment days a little bit I recommend minimum 2 to 3 months of treatment before repeating imaging study, probably sometime around mid to end of February 2022

## 2020-07-03 ENCOUNTER — Encounter: Payer: Self-pay | Admitting: Hematology and Oncology

## 2020-07-03 NOTE — Progress Notes (Signed)
Returned call to patient whom requested some assistance.  Patient needed assistance with obtaining itemized statements for [facility] policy. She called the billing department and was able to get some assistance.Advised patient we no longer have access to provide this information and it has to be sent from billing. Advised her to request a supervisor if she doesn't receive the necessary information. She verbalized understanding.  She has my card for any additional financial questions or concerns.

## 2020-07-13 ENCOUNTER — Inpatient Hospital Stay: Payer: Medicare Other

## 2020-07-13 ENCOUNTER — Other Ambulatory Visit: Payer: Self-pay

## 2020-07-13 ENCOUNTER — Inpatient Hospital Stay: Payer: Medicare Other | Attending: Gynecology

## 2020-07-13 VITALS — BP 128/67 | HR 87 | Temp 98.9°F | Resp 18 | Wt 188.8 lb

## 2020-07-13 DIAGNOSIS — D61818 Other pancytopenia: Secondary | ICD-10-CM | POA: Diagnosis not present

## 2020-07-13 DIAGNOSIS — C561 Malignant neoplasm of right ovary: Secondary | ICD-10-CM

## 2020-07-13 DIAGNOSIS — Z7189 Other specified counseling: Secondary | ICD-10-CM

## 2020-07-13 DIAGNOSIS — E119 Type 2 diabetes mellitus without complications: Secondary | ICD-10-CM | POA: Insufficient documentation

## 2020-07-13 DIAGNOSIS — Z5111 Encounter for antineoplastic chemotherapy: Secondary | ICD-10-CM | POA: Insufficient documentation

## 2020-07-13 DIAGNOSIS — G620 Drug-induced polyneuropathy: Secondary | ICD-10-CM | POA: Diagnosis not present

## 2020-07-13 LAB — CBC WITH DIFFERENTIAL (CANCER CENTER ONLY)
Abs Immature Granulocytes: 0.02 10*3/uL (ref 0.00–0.07)
Basophils Absolute: 0 10*3/uL (ref 0.0–0.1)
Basophils Relative: 1 %
Eosinophils Absolute: 0.1 10*3/uL (ref 0.0–0.5)
Eosinophils Relative: 2 %
HCT: 30.2 % — ABNORMAL LOW (ref 36.0–46.0)
Hemoglobin: 9.5 g/dL — ABNORMAL LOW (ref 12.0–15.0)
Immature Granulocytes: 1 %
Lymphocytes Relative: 24 %
Lymphs Abs: 1 10*3/uL (ref 0.7–4.0)
MCH: 32.9 pg (ref 26.0–34.0)
MCHC: 31.5 g/dL (ref 30.0–36.0)
MCV: 104.5 fL — ABNORMAL HIGH (ref 80.0–100.0)
Monocytes Absolute: 0.4 10*3/uL (ref 0.1–1.0)
Monocytes Relative: 9 %
Neutro Abs: 2.7 10*3/uL (ref 1.7–7.7)
Neutrophils Relative %: 63 %
Platelet Count: 248 10*3/uL (ref 150–400)
RBC: 2.89 MIL/uL — ABNORMAL LOW (ref 3.87–5.11)
RDW: 14.7 % (ref 11.5–15.5)
WBC Count: 4.1 10*3/uL (ref 4.0–10.5)
nRBC: 0 % (ref 0.0–0.2)

## 2020-07-13 LAB — CMP (CANCER CENTER ONLY)
ALT: 25 U/L (ref 0–44)
AST: 25 U/L (ref 15–41)
Albumin: 3.4 g/dL — ABNORMAL LOW (ref 3.5–5.0)
Alkaline Phosphatase: 91 U/L (ref 38–126)
Anion gap: 12 (ref 5–15)
BUN: 23 mg/dL (ref 8–23)
CO2: 23 mmol/L (ref 22–32)
Calcium: 9.1 mg/dL (ref 8.9–10.3)
Chloride: 104 mmol/L (ref 98–111)
Creatinine: 0.78 mg/dL (ref 0.44–1.00)
GFR, Estimated: >60 mL/min (ref >60)
Glucose, Bld: 107 mg/dL — ABNORMAL HIGH (ref 70–99)
Potassium: 3.9 mmol/L (ref 3.5–5.1)
Sodium: 139 mmol/L (ref 135–145)
Total Bilirubin: 0.4 mg/dL (ref 0.3–1.2)
Total Protein: 7.2 g/dL (ref 6.5–8.1)

## 2020-07-13 MED ORDER — SODIUM CHLORIDE 0.9% FLUSH
10.0000 mL | Freq: Once | INTRAVENOUS | Status: AC
  Administered 2020-07-13: 10 mL
  Filled 2020-07-13: qty 10

## 2020-07-13 MED ORDER — SODIUM CHLORIDE 0.9 % IV SOLN
20.0000 mg | Freq: Once | INTRAVENOUS | Status: AC
  Administered 2020-07-13: 20 mg via INTRAVENOUS
  Filled 2020-07-13: qty 20

## 2020-07-13 MED ORDER — SODIUM CHLORIDE 0.9 % IV SOLN
Freq: Once | INTRAVENOUS | Status: AC
  Filled 2020-07-13: qty 250

## 2020-07-13 MED ORDER — DIPHENHYDRAMINE HCL 50 MG/ML IJ SOLN
INTRAMUSCULAR | Status: AC
  Filled 2020-07-13: qty 1

## 2020-07-13 MED ORDER — DIPHENHYDRAMINE HCL 50 MG/ML IJ SOLN
25.0000 mg | Freq: Once | INTRAMUSCULAR | Status: AC
  Administered 2020-07-13: 25 mg via INTRAVENOUS

## 2020-07-13 MED ORDER — HEPARIN SOD (PORK) LOCK FLUSH 100 UNIT/ML IV SOLN
500.0000 [IU] | Freq: Once | INTRAVENOUS | Status: AC | PRN
  Administered 2020-07-13: 500 [IU]
  Filled 2020-07-13: qty 5

## 2020-07-13 MED ORDER — SODIUM CHLORIDE 0.9% FLUSH
10.0000 mL | INTRAVENOUS | Status: DC | PRN
  Administered 2020-07-13: 10 mL
  Filled 2020-07-13: qty 10

## 2020-07-13 MED ORDER — FAMOTIDINE IN NACL 20-0.9 MG/50ML-% IV SOLN
20.0000 mg | Freq: Once | INTRAVENOUS | Status: AC
  Administered 2020-07-13: 20 mg via INTRAVENOUS

## 2020-07-13 MED ORDER — FAMOTIDINE IN NACL 20-0.9 MG/50ML-% IV SOLN
INTRAVENOUS | Status: AC
  Filled 2020-07-13: qty 50

## 2020-07-13 MED ORDER — PACLITAXEL CHEMO INJECTION 300 MG/50ML
80.0000 mg/m2 | Freq: Once | INTRAVENOUS | Status: AC
  Administered 2020-07-13: 162 mg via INTRAVENOUS
  Filled 2020-07-13: qty 27

## 2020-07-13 NOTE — Patient Instructions (Signed)
[facility] Discharge Instructions for Patients Receiving Chemotherapy  Today you received the following chemotherapy agents Paclitaxel (TAXOL).  To help prevent nausea and vomiting after your treatment, we encourage you to take your nausea medication as prescribed.  If you develop nausea and vomiting that is not controlled by your nausea medication, call the clinic.   BELOW ARE SYMPTOMS THAT SHOULD BE REPORTED IMMEDIATELY:  *FEVER GREATER THAN 100.5 F  *CHILLS WITH OR WITHOUT FEVER  NAUSEA AND VOMITING THAT IS NOT CONTROLLED WITH YOUR NAUSEA MEDICATION  *UNUSUAL SHORTNESS OF BREATH  *UNUSUAL BRUISING OR BLEEDING  TENDERNESS IN MOUTH AND THROAT WITH OR WITHOUT PRESENCE OF ULCERS  *URINARY PROBLEMS  *BOWEL PROBLEMS  UNUSUAL RASH Items with * indicate a potential emergency and should be followed up as soon as possible.  Feel free to call the clinic should you have any questions or concerns. The clinic phone number is [phone].  Please show the CHEMO ALERT CARD at check-in to the Emergency Department and triage nurse.   

## 2020-07-14 LAB — CA 125: Cancer Antigen (CA) 125: 17.3 U/mL (ref 0.0–38.1)

## 2020-07-18 ENCOUNTER — Other Ambulatory Visit: Payer: Self-pay

## 2020-07-18 DIAGNOSIS — D6481 Anemia due to antineoplastic chemotherapy: Secondary | ICD-10-CM

## 2020-07-18 DIAGNOSIS — E119 Type 2 diabetes mellitus without complications: Secondary | ICD-10-CM

## 2020-07-18 DIAGNOSIS — E559 Vitamin D deficiency, unspecified: Secondary | ICD-10-CM

## 2020-07-18 DIAGNOSIS — C561 Malignant neoplasm of right ovary: Secondary | ICD-10-CM

## 2020-07-19 ENCOUNTER — Other Ambulatory Visit: Payer: Self-pay | Admitting: Hematology and Oncology

## 2020-07-19 ENCOUNTER — Inpatient Hospital Stay: Payer: Medicare Other

## 2020-07-19 ENCOUNTER — Encounter: Payer: Self-pay | Admitting: Hematology and Oncology

## 2020-07-19 ENCOUNTER — Other Ambulatory Visit: Payer: Self-pay

## 2020-07-19 ENCOUNTER — Inpatient Hospital Stay (HOSPITAL_BASED_OUTPATIENT_CLINIC_OR_DEPARTMENT_OTHER): Payer: Medicare Other | Admitting: Hematology and Oncology

## 2020-07-19 DIAGNOSIS — D61818 Other pancytopenia: Secondary | ICD-10-CM | POA: Diagnosis not present

## 2020-07-19 DIAGNOSIS — T451X5A Adverse effect of antineoplastic and immunosuppressive drugs, initial encounter: Secondary | ICD-10-CM

## 2020-07-19 DIAGNOSIS — E559 Vitamin D deficiency, unspecified: Secondary | ICD-10-CM

## 2020-07-19 DIAGNOSIS — C561 Malignant neoplasm of right ovary: Secondary | ICD-10-CM

## 2020-07-19 DIAGNOSIS — Z5111 Encounter for antineoplastic chemotherapy: Secondary | ICD-10-CM | POA: Diagnosis not present

## 2020-07-19 DIAGNOSIS — E119 Type 2 diabetes mellitus without complications: Secondary | ICD-10-CM

## 2020-07-19 DIAGNOSIS — D6481 Anemia due to antineoplastic chemotherapy: Secondary | ICD-10-CM

## 2020-07-19 DIAGNOSIS — G620 Drug-induced polyneuropathy: Secondary | ICD-10-CM | POA: Diagnosis not present

## 2020-07-19 DIAGNOSIS — Z7189 Other specified counseling: Secondary | ICD-10-CM

## 2020-07-19 LAB — CBC WITH DIFFERENTIAL (CANCER CENTER ONLY)
Abs Immature Granulocytes: 0.02 10*3/uL (ref 0.00–0.07)
Basophils Absolute: 0 10*3/uL (ref 0.0–0.1)
Basophils Relative: 1 %
Eosinophils Absolute: 0.1 10*3/uL (ref 0.0–0.5)
Eosinophils Relative: 2 %
HCT: 28.6 % — ABNORMAL LOW (ref 36.0–46.0)
Hemoglobin: 9.1 g/dL — ABNORMAL LOW (ref 12.0–15.0)
Immature Granulocytes: 1 %
Lymphocytes Relative: 21 %
Lymphs Abs: 0.7 10*3/uL (ref 0.7–4.0)
MCH: 33 pg (ref 26.0–34.0)
MCHC: 31.8 g/dL (ref 30.0–36.0)
MCV: 103.6 fL — ABNORMAL HIGH (ref 80.0–100.0)
Monocytes Absolute: 0.3 10*3/uL (ref 0.1–1.0)
Monocytes Relative: 10 %
Neutro Abs: 2.3 10*3/uL (ref 1.7–7.7)
Neutrophils Relative %: 65 %
Platelet Count: 216 10*3/uL (ref 150–400)
RBC: 2.76 MIL/uL — ABNORMAL LOW (ref 3.87–5.11)
RDW: 14.9 % (ref 11.5–15.5)
WBC Count: 3.5 10*3/uL — ABNORMAL LOW (ref 4.0–10.5)
nRBC: 0 % (ref 0.0–0.2)

## 2020-07-19 LAB — VITAMIN D 25 HYDROXY (VIT D DEFICIENCY, FRACTURES): Vit D, 25-Hydroxy: 52.53 ng/mL (ref 30–100)

## 2020-07-19 LAB — CMP (CANCER CENTER ONLY)
ALT: 44 U/L (ref 0–44)
AST: 37 U/L (ref 15–41)
Albumin: 3.1 g/dL — ABNORMAL LOW (ref 3.5–5.0)
Alkaline Phosphatase: 105 U/L (ref 38–126)
Anion gap: 8 (ref 5–15)
BUN: 23 mg/dL (ref 8–23)
CO2: 28 mmol/L (ref 22–32)
Calcium: 8.6 mg/dL — ABNORMAL LOW (ref 8.9–10.3)
Chloride: 104 mmol/L (ref 98–111)
Creatinine: 0.76 mg/dL (ref 0.44–1.00)
GFR, Estimated: >60 mL/min (ref >60)
Glucose, Bld: 119 mg/dL — ABNORMAL HIGH (ref 70–99)
Potassium: 3.9 mmol/L (ref 3.5–5.1)
Sodium: 140 mmol/L (ref 135–145)
Total Bilirubin: 0.5 mg/dL (ref 0.3–1.2)
Total Protein: 7.1 g/dL (ref 6.5–8.1)

## 2020-07-19 MED ORDER — SODIUM CHLORIDE 0.9 % IV SOLN
80.0000 mg/m2 | Freq: Once | INTRAVENOUS | Status: AC
  Administered 2020-07-19: 162 mg via INTRAVENOUS
  Filled 2020-07-19: qty 27

## 2020-07-19 MED ORDER — SODIUM CHLORIDE 0.9 % IV SOLN
20.0000 mg | Freq: Once | INTRAVENOUS | Status: AC
  Administered 2020-07-19: 20 mg via INTRAVENOUS
  Filled 2020-07-19: qty 20

## 2020-07-19 MED ORDER — HEPARIN SOD (PORK) LOCK FLUSH 100 UNIT/ML IV SOLN
500.0000 [IU] | Freq: Once | INTRAVENOUS | Status: AC | PRN
  Administered 2020-07-19: 500 [IU]
  Filled 2020-07-19: qty 5

## 2020-07-19 MED ORDER — SODIUM CHLORIDE 0.9 % IV SOLN
Freq: Once | INTRAVENOUS | Status: AC
  Filled 2020-07-19: qty 250

## 2020-07-19 MED ORDER — FAMOTIDINE IN NACL 20-0.9 MG/50ML-% IV SOLN
20.0000 mg | Freq: Once | INTRAVENOUS | Status: AC
  Administered 2020-07-19: 20 mg via INTRAVENOUS

## 2020-07-19 MED ORDER — DIPHENHYDRAMINE HCL 50 MG/ML IJ SOLN
INTRAMUSCULAR | Status: AC
  Filled 2020-07-19: qty 1

## 2020-07-19 MED ORDER — FAMOTIDINE IN NACL 20-0.9 MG/50ML-% IV SOLN
INTRAVENOUS | Status: AC
  Filled 2020-07-19: qty 50

## 2020-07-19 MED ORDER — DIPHENHYDRAMINE HCL 50 MG/ML IJ SOLN
25.0000 mg | Freq: Once | INTRAMUSCULAR | Status: AC
  Administered 2020-07-19: 25 mg via INTRAVENOUS

## 2020-07-19 MED ORDER — SODIUM CHLORIDE 0.9% FLUSH
10.0000 mL | INTRAVENOUS | Status: DC | PRN
  Administered 2020-07-19: 10 mL
  Filled 2020-07-19: qty 10

## 2020-07-19 NOTE — Assessment & Plan Note (Signed)
Her counts are stable I have checked serum vitamin B12 and iron studies in February and they are adequate She is not symptomatic and does not need further transfusion today 

## 2020-07-19 NOTE — Progress Notes (Signed)
[place] OFFICE PROGRESS NOTE  Patient Care Team: Lilian Coma., MD as PCP - General (Internal Medicine)  ASSESSMENT & PLAN:  Ovarian cancer on right [facility]) So far, she tolerated treatment fairly well without major side effects She is noted to have mild pancytopenia but with adequate blood count to proceed with treatment without delay I recommend minimum 2 to 3 months of treatment before repeating imaging study, probably sometime around mid to end of February 2022  Pancytopenia, acquired [facility]) Her counts are stable I have checked serum vitamin B12 and iron studies in February and they are adequate She is not symptomatic and does not need further transfusion today  Peripheral neuropathy due to chemotherapy [facility]) She denies worsening peripheral neuropathy with treatment We will observe carefully while on therapy   No orders of the defined types were placed in this encounter.   All questions were answered. The patient knows to call the clinic with any problems, questions or concerns. The total time spent in the appointment was 20 minutes encounter with patients including review of chart and various tests results, discussions about plan of care and coordination of care plan   Heath Lark, MD 07/19/2020 9:26 AM  INTERVAL HISTORY: Please see below for problem oriented charting. She returns with her husband for further follow-up She is doing well She denies worsening neuropathy No recent nausea, changes in bowel habits or abdominal pain The patient denies any recent signs or symptoms of bleeding such as spontaneous epistaxis, hematuria or hematochezia.   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    03/20/2017 Imaging   GYN ultrasound was performed this revealed a small fundus measuring 5 cm there are 2 small fibroids within the fundus with the largest measuring 1.3 cm Within the endometrium there is a small amount a fluid the endometrial wall was slightly thickened at 4.8 mm the ultrasound was consistent with a small endometrial polyp  The right adnexa measured 7.4 x 6.7 cm contained a both a cystic and solid mass with a small amount of shadowing This is consistent with a dermoid tumor the left ovary was 2.6 x 1.5 x 1.0 cm There is no other adnexal masses no free fluid within the abdomen no ascites     04/10/2017 Surgery   Operation: Operative Laparscopy with  Peritoneal biopsy 2 hysteroscopy D&C Surgeon: Lynder Parents MD  Specimens: Peritoneal biopsy and cellular washings  Complications: None FIndings Small amount of ascites was found cellular washings were sent There was peritoneal studding And omental caking Even in steep Trendelenburg the ovaries and uterus were unable to be visualized due to the small intestines in the lower pelvis     04/10/2017 Pathology Results   Biopsy from [facility] was nondiagnostic.  Peritoneum biopsy did not reveal malignancy.  Endometrial biopsy also did not reveal malignancy.   04/13/2017 Tumor Marker   Patient's tumor was tested for the following markers: CA-125 Results of the tumor marker test revealed 324.1   04/20/2017 Tumor Marker   Patient's tumor was tested for the  following markers: CA-125 Results of the tumor marker test revealed 345.6   04/22/2017 Pathology Results   Peritoneum, biopsy - CARCINOMA. - SEE COMMENT.   04/22/2017 Procedure   CT-guided core biopsy performed of peritoneal tumor in the left lower quadrant.   04/24/2017 - 09/25/2017 Chemotherapy    She received carboplatin and Taxol x 3 cycles followed by interval surgical debulking on 12/31. Chemotherapy was subsequently resumed for 3 more cycles   06/05/2017 Tumor Marker   Patient's tumor was tested for the following markers: CA-125 Results of the tumor marker test revealed 27.5   06/17/2017 Imaging   1. Considerable improvement in the prior omental caking, which now presents mainly as an indistinct stranding with slight nodularity along the left upper quadrant omentum. 2. Stable appearance of right ovarian mass with fatty and calcific elements classic for a dermoid. 3. There is accentuated enhancement in the walls of the common hepatic duct and common bile duct. A low-grade cholangitis is not readily excluded. 4. Other imaging findings of potential clinical significance: Mild cardiomegaly. Aortic Atherosclerosis (ICD10-I70.0). Lumbar scoliosis, spondylosis, degenerative disc disease, and pars defects at L5. These cause impingement at L5-S1 and lesser impingement at L3-4 and L4-5. 5. 65% compression fracture at L1 slightly worsened than on the prior exam, with associated vertebral sclerosis and posterior retropulsion.   07/06/2017 Surgery   Bilateral Salpingo-Oophorectomy W/Omentectomy, Total Abd Hysterectomy & Radical Dissection For Debulking Right - Ureterolysis, With Or Without Repositioning Of Ureter For Retroperitoneal Fibrosis   07/06/2017 Pathology Results   A: Omentum, omentectomy - Omentum with minimal residual carcinoma, consistent with high grade serous carcinoma (microscopic; stage ypT3a) - Extensive treatment effect with calcifications, fibrosis, histiocytes, and giant cell response - CRS score 3  B: Uterus with cervix and left ovary and fallopian tube, hysterectomy and left salpingo-oophorectomy - Cervix: Ectocervix and endocervix with no dysplasia or malignancy identified - Endometrium: Inactive with no hyperplasia, atypia, or malignancy identified - Myometrium:  Adenomyosis and benign leiomyoma, size 1.2 cm - Serosa: Focal calcification and histiocytes suggestive of treatment effect, with no viable carcinoma identified - Ovary, left: Physiologic changes and no carcinoma identified - Fallopian tube, left: 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 and no malignancy identified  C: Ovary and fallopian tube, right, salpingo-oophorectomy - Right fallopian tube with focal residual carcinoma, consistent with high grade serous carcinoma, and serous tubal intraepithelial carcinoma (STIC) - Associated calcifications and histiocytes suggestive of treatment response - Right ovary with calcifications and histiocytes suggestive of treatment effect, with no definite viable carcinoma identified - Mature cystic teratoma, 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 - See synoptic report and comment   08/14/2017 Tumor Marker   Patient's tumor was tested for the following markers: CA-125 Results of the tumor marker test revealed 14.6   09/04/2017 Tumor Marker   Patient's tumor was tested for the following markers: CA-125 Results of the tumor marker test revealed 12.5   10/03/2017 Genetic Testing   The Common Hereditary Cancer Panel offered by [facility] includes sequencing and/or deletion duplication testing of the following 47 genes: APC, ATM, AXIN2, BARD1, BMPR1A, BRCA1, BRCA2, BRIP1, CDH1, CDKN2A (p14ARF), CDKN2A (p16INK4a), CKD4, CHEK2, CTNNA1, DICER1, EPCAM (Deletion/duplication testing only), GREM1 (promoter region deletion/duplication testing only), KIT, MEN1, MLH1, MSH2, MSH3, MSH6, MUTYH, NBN, NF1, NHTL1, PALB2, PDGFRA, PMS2, POLD1, POLE, PTEN, RAD50, RAD51C, RAD51D, SDHB, SDHC, SDHD, SMAD4, SMARCA4. STK11, TP53, TSC1, TSC2, and VHL.  The following genes were evaluated for sequence changes only: SDHA and HOXB13 c.251G>A variant only.  Results: Negative, no pathogenic  variants identified.  The date of this test report is 10/03/2017.    10/28/2017 Imaging   1. Stable pulmonary nodules. No new or  progressive findings. 2. No residual or recurrent omental disease is identified. No obvious peritoneal surface disease. 3. Increasing soft tissue density and ill-defined interstitial changes around the abdominal wall surgical incision. This may be progressive postoperative changes or granulation tissue but attention on future scans to exclude the possibility of tumor. 4. Status post resection of right adnexal mass. No pelvic mass or pelvic adenopathy. 5. Stable T12 compression fracture.   01/25/2018 Tumor Marker   Patient's tumor was tested for the following markers: CA-125 Results of the tumor marker test revealed 9.2   12/10/2018 Tumor Marker   Patient's tumor was tested for the following markers: CA-125 Results of the tumor marker test revealed 212   12/22/2018 Imaging   1. Interval development of multiple soft tissue lesions in the gastrocolic ligament, mesentery, and along the peritoneal surface. These soft tissue lesions measure up to 6.5 cm in size. Associated interval development of moderate volume ascites in the abdomen and pelvis. Imaging features are consistent with metastatic disease. 2. Soft tissue projects through the rectus sheath in the right paraumbilical region, likely herniated small bowel as there is a large amount of decompressed small bowel does deep to this finding. However, metastatic deposit at this location is also possibility.  3. Trace pneumobilia in the left liver suggests prior sphincterotomy. 4.  Aortic Atherosclerois (ICD10-170.0)   12/30/2018 Procedure   Successful ultrasound-guided therapeutic paracentesis yielding 4.5 liters of peritoneal fluid.   12/30/2018 Procedure   Technically successful right IJ power-injectable port catheter placement. Ready for routine use.   12/31/2018 -  Chemotherapy   The patient had carboplatin and taxol for chemotherapy treatment.     01/21/2019 Tumor Marker   Patient's tumor was tested for the following markers: CA-125 Results  of the tumor marker test revealed 408   02/11/2019 Tumor Marker   Patient's tumor was tested for the following markers: CA-125 Results of the tumor marker test revealed 646.   03/02/2019 Imaging   1. Marked improvement in peritoneal carcinomatosis/ascites, without complete resolution. 2.  Aortic atherosclerosis (ICD10-170.0).     03/25/2019 Tumor Marker   Patient's tumor was tested for the following markers: CA-125 Results of the tumor marker test revealed 24.1.   04/15/2019 Tumor Marker   Patient's tumor was tested for the following markers: CA-125 Results of the tumor marker test revealed 15.3   05/18/2019 Imaging   1. There is progressive, nearly complete resolution of peritoneal and omental nodularity seen on prior examination with very faint residual peritoneal and omental haziness (series 2, image 50, 39).   2. Interval resolution of ascites.   3.  Status post hysterectomy and oophorectomy.   4.  Coronary artery disease.  Aortic Atherosclerosis (ICD10-I70.0).      Tumor Marker   Patient's tumor was tested for the following markers: CA-125 Results of the tumor marker test revealed 11.2.   06/10/2019 Tumor Marker   Patient's tumor was tested for the following markers: CA-125 Results of the tumor marker test revealed 9.1   07/29/2019 Tumor Marker   Patient's tumor was tested for the following markers: CA-125 Results of the tumor marker test revealed 8.8   08/18/2019 Imaging   1. No evidence of new or progressive metastatic disease in the abdomen or pelvis. Stable small nodular soft tissue focus at the right umbilicus. No new peritoneal implants. No ascites. 2.  Chronic findings include: Aortic Atherosclerosis (ICD10-I70.0). Coronary atherosclerosis. Chronic moderate T12 vertebral compression fractures.   09/09/2019 Tumor Marker   Patient's tumor was tested for the following markers: CA-125 Results of the tumor marker test revealed 11.4.   10/21/2019 Tumor Marker   Patient's  tumor was tested for the following markers: CA-125 Results of the tumor marker test revealed 21.5   10/31/2019 Imaging   1. New hepatic and peritoneal metastatic disease. 2.  Aortic atherosclerosis (ICD10-I70.0).     11/11/2019 -  Chemotherapy   The patient had carboplatin and gemzar for chemotherapy treatment.     11/11/2019 Tumor Marker   Patient's tumor was tested for the following markers: CA-125 Results of the tumor marker test revealed 40.2    Genetic Testing   Patient has genetic testing done for PD-L1. Results revealed patient has the following:  PD-L1 staining in tumor cells (TC): <1% PD-L1 staining in tumor-associated immune cells (IC): 0% PD-L1 combined positive score (CPS): <1%   11/11/2019 - 05/11/2020 Chemotherapy   The patient had carboplatin and gemzar for chemotherapy treatment    12/16/2019 Tumor Marker   Patient's tumor was tested for the following markers: CA-125 Results of the tumor marker test revealed 41.7   01/13/2020 Tumor Marker   Patient's tumor was tested for the following markers: CA-125 Results of the tumor marker test revealed 18.9   02/09/2020 Imaging   1. Positive response to therapy. 2. Hypodense lesions in liver are smaller or no longer measurable. 3. No clear residual peritoneal metastasis. 4. No ascites. 5. Persistent enhancement of the common bile duct suggest biliary inflammation.     02/10/2020 Tumor Marker   Patient's tumor was tested for the following markers: CA-125 Results of the tumor marker test revealed 13.2.   03/19/2020 Tumor Marker   Patient's tumor was tested for the following markers: CA-125 Results of the tumor marker test revealed 15.5   04/13/2020 Tumor Marker   Patient's tumor was tested for the following markers: CA-`125 Results of the tumor marker test revealed 17.9.   05/11/2020 Tumor Marker   Patient's tumor was tested for the following markers: CA-125 Results of the tumor marker test revealed 20.1.   05/24/2020  Imaging   1. Interval progression of soft tissue nodularity in the cul-de-sac, along the vaginal cuff. 2. Interval progression of inferior right liver lesion with new tiny capsular soft tissue lesions along the left liver and inferior right liver. Imaging features are highly concerning for metastatic disease. 3. Small volume ascites. 4. Similar appearance of focal enhancement in the region of the umbilicus. 5. Aortic Atherosclerosis (ICD10-I70.0).   06/01/2020 -  Chemotherapy   The patient had taxol for chemotherapy treatment.     06/08/2020 Tumor Marker   Patient's tumor was tested for the following markers: CA-125 Results of the tumor marker test revealed 26   07/13/2020 Tumor Marker   Patient's tumor was tested for the following markers: CA-125. Results of the tumor marker test revealed 17.3     REVIEW OF SYSTEMS:   Constitutional: Denies fevers, chills or abnormal weight loss Eyes: Denies blurriness of vision Ears, nose, mouth, throat, and face: Denies mucositis or sore throat Respiratory: Denies cough, dyspnea or wheezes Cardiovascular: Denies palpitation, chest discomfort or lower extremity swelling Gastrointestinal:  Denies nausea, heartburn or change in bowel habits Skin: Denies abnormal skin rashes Lymphatics: Denies new lymphadenopathy or easy bruising Neurological:Denies numbness, tingling or new weaknesses Behavioral/Psych: Mood is stable, no new changes  All other systems were reviewed  with the patient and are negative.  I have reviewed the past medical history, past surgical history, social history and family history with the patient and they are unchanged from previous note.  ALLERGIES:  is allergic to aspirin, nsaids, and tramadol.  MEDICATIONS:  Current Outpatient Medications  Medication Sig Dispense Refill  . acetaminophen (TYLENOL) 650 MG CR tablet Take 1,300 mg by mouth every 8 (eight) hours.     Marland Kitchen alendronate (FOSAMAX) 70 MG tablet Take 70 mg by mouth every  Sunday.     Marland Kitchen atorvastatin (LIPITOR) 80 MG tablet Take 40 mg by mouth at bedtime.     . Cholecalciferol (VITAMIN D3) 2000 units TABS Take 2,000 Units by mouth daily.    . citalopram (CELEXA) 10 MG tablet Take 10 mg by mouth at bedtime    . ferrous sulfate 325 (65 FE) MG tablet Take 325 mg by mouth daily. 3 hours after meal and 30 min before a meal    . gabapentin (NEURONTIN) 300 MG capsule Take by mouth.    . Glucosamine Sulfate 500 MG TABS Take 500 mg by mouth 2 (two) times daily.     Marland Kitchen lidocaine-prilocaine (EMLA) cream Apply to affected area once 30 g 3  . loratadine (CLARITIN) 10 MG tablet Take 10 mg by mouth at bedtime.     Marland Kitchen losartan (COZAAR) 25 MG tablet Take 25 mg by mouth daily.     . meclizine (ANTIVERT) 25 MG tablet Take 25 mg by mouth 2 (two) times daily as needed for dizziness.     . metFORMIN (GLUCOPHAGE) 1000 MG tablet Take 1,000 mg by mouth 2 (two) times daily with a meal.     . omeprazole (PRILOSEC) 40 MG capsule Take 40 mg by mouth daily.     . ondansetron (ZOFRAN) 8 MG tablet Take 1 tablet (8 mg total) by mouth every 8 (eight) hours as needed for refractory nausea / vomiting. 30 tablet 1  . prochlorperazine (COMPAZINE) 10 MG tablet Take 1 tablet (10 mg total) by mouth every 6 (six) hours as needed (Nausea or vomiting). 60 tablet 1  . Pyridoxine HCl (VITAMIN B-6 PO) Take 50 mg by mouth 1 day or 1 dose.    . vitamin B-12 (CYANOCOBALAMIN) 1000 MCG tablet Take 1,000 mcg by mouth daily.      No current facility-administered medications for this visit.   Facility-Administered Medications Ordered in Other Visits  Medication Dose Route Frequency Provider Last Rate Last Admin  . dexamethasone (DECADRON) 20 mg in sodium chloride 0.9 % 50 mL IVPB  20 mg Intravenous Once Heath Lark, MD 208 mL/hr at 07/19/20 0922 20 mg at 07/19/20 0922  . famotidine (PEPCID) IVPB 20 mg premix  20 mg Intravenous Once [name], Maimuna Leaman, MD      . heparin lock flush 100 unit/mL  500 Units Intracatheter Once PRN  [name], Tennelle Taflinger, MD      . PACLitaxel (TAXOL) 162 mg in sodium chloride 0.9 % 250 mL chemo infusion (</= 90m/m2)  80 mg/m2 (Treatment Plan Recorded) Intravenous Once Fatiha Guzy, MD      . sodium chloride flush (NS) 0.9 % injection 10 mL  10 mL Intracatheter Once Lanya Bucks, MD      . sodium chloride flush (NS) 0.9 % injection 10 mL  10 mL Intracatheter PRN GAlvy Bimler Avigail Pilling, MD        PHYSICAL EXAMINATION: ECOG PERFORMANCE STATUS: 1 - Symptomatic but completely ambulatory  Vitals:   07/19/20 0820  BP: (!) 116/58  Pulse: 95  Resp: 15  Temp: (!) 97.2 F (36.2 C)  SpO2: 97%   Filed Weights   07/19/20 0820  Weight: 188 lb 3.2 oz (85.4 kg)    GENERAL:alert, no distress and comfortable SKIN: skin color, texture, turgor are normal, no rashes or significant lesions EYES: normal, Conjunctiva are pink and non-injected, sclera clear OROPHARYNX:no exudate, no erythema and lips, buccal mucosa, and tongue normal  NECK: supple, thyroid normal size, non-tender, without nodularity LYMPH:  no palpable lymphadenopathy in the cervical, axillary or inguinal LUNGS: clear to auscultation and percussion with normal breathing effort HEART: regular rate & rhythm and no murmurs and no lower extremity edema ABDOMEN:abdomen soft, non-tender and normal bowel sounds Musculoskeletal:no cyanosis of digits and no clubbing  NEURO: alert & oriented x 3 with fluent speech, no focal motor/sensory deficits  LABORATORY DATA:  I have reviewed the data as listed    Component Value Date/Time   NA 140 07/19/2020 0804   NA 139 06/05/2017 0746   K 3.9 07/19/2020 0804   K 4.1 06/05/2017 0746   CL 104 07/19/2020 0804   CO2 28 07/19/2020 0804   CO2 21 (L) 06/05/2017 0746   GLUCOSE 119 (H) 07/19/2020 0804   GLUCOSE 279 (H) 06/05/2017 0746   BUN 23 07/19/2020 0804   BUN 15.0 06/05/2017 0746   CREATININE 0.76 07/19/2020 0804   CREATININE 0.9 06/05/2017 0746   CALCIUM 8.6 (L) 07/19/2020 0804   CALCIUM 9.1 01/13/2020 0735    CALCIUM 10.1 06/05/2017 0746   PROT 7.1 07/19/2020 0804   PROT 8.1 06/05/2017 0746   ALBUMIN 3.1 (L) 07/19/2020 0804   ALBUMIN 4.0 06/05/2017 0746   AST 37 07/19/2020 0804   AST 22 06/05/2017 0746   ALT 44 07/19/2020 0804   ALT 36 06/05/2017 0746   ALKPHOS 105 07/19/2020 0804   ALKPHOS 98 06/05/2017 0746   BILITOT 0.5 07/19/2020 0804   BILITOT 0.44 06/05/2017 0746   GFRNONAA >60 07/19/2020 0804   GFRAA >60 03/30/2020 0734    No results found for: SPEP, UPEP  Lab Results  Component Value Date   WBC 3.5 (L) 07/19/2020   NEUTROABS 2.3 07/19/2020   HGB 9.1 (L) 07/19/2020   HCT 28.6 (L) 07/19/2020   MCV 103.6 (H) 07/19/2020   PLT 216 07/19/2020      Chemistry      Component Value Date/Time   NA 140 07/19/2020 0804   NA 139 06/05/2017 0746   K 3.9 07/19/2020 0804   K 4.1 06/05/2017 0746   CL 104 07/19/2020 0804   CO2 28 07/19/2020 0804   CO2 21 (L) 06/05/2017 0746   BUN 23 07/19/2020 0804   BUN 15.0 06/05/2017 0746   CREATININE 0.76 07/19/2020 0804   CREATININE 0.9 06/05/2017 0746      Component Value Date/Time   CALCIUM 8.6 (L) 07/19/2020 0804   CALCIUM 9.1 01/13/2020 0735   CALCIUM 10.1 06/05/2017 0746   ALKPHOS 105 07/19/2020 0804   ALKPHOS 98 06/05/2017 0746   AST 37 07/19/2020 0804   AST 22 06/05/2017 0746   ALT 44 07/19/2020 0804   ALT 36 06/05/2017 0746   BILITOT 0.5 07/19/2020 0804   BILITOT 0.44 06/05/2017 0746

## 2020-07-19 NOTE — Patient Instructions (Signed)
[facility] Discharge Instructions for Patients Receiving Chemotherapy  Today you received the following chemotherapy agents Paclitaxel (TAXOL).  To help prevent nausea and vomiting after your treatment, we encourage you to take your nausea medication as prescribed.  If you develop nausea and vomiting that is not controlled by your nausea medication, call the clinic.   BELOW ARE SYMPTOMS THAT SHOULD BE REPORTED IMMEDIATELY:  *FEVER GREATER THAN 100.5 F  *CHILLS WITH OR WITHOUT FEVER  NAUSEA AND VOMITING THAT IS NOT CONTROLLED WITH YOUR NAUSEA MEDICATION  *UNUSUAL SHORTNESS OF BREATH  *UNUSUAL BRUISING OR BLEEDING  TENDERNESS IN MOUTH AND THROAT WITH OR WITHOUT PRESENCE OF ULCERS  *URINARY PROBLEMS  *BOWEL PROBLEMS  UNUSUAL RASH Items with * indicate a potential emergency and should be followed up as soon as possible.  Feel free to call the clinic should you have any questions or concerns. The clinic phone number is [phone].  Please show the CHEMO ALERT CARD at check-in to the Emergency Department and triage nurse.   

## 2020-07-19 NOTE — Assessment & Plan Note (Signed)
She denies worsening peripheral neuropathy with treatment We will observe carefully while on therapy 

## 2020-07-19 NOTE — Patient Instructions (Signed)

## 2020-07-19 NOTE — Assessment & Plan Note (Signed)
So far, she tolerated treatment fairly well without major side effects She is noted to have mild pancytopenia but with adequate blood count to proceed with treatment without delay I recommend minimum 2 to 3 months of treatment before repeating imaging study, probably sometime around mid to end of February 2022

## 2020-07-20 LAB — FRUCTOSAMINE: Fructosamine: 235 umol/L (ref 0–285)

## 2020-07-30 IMAGING — CT CT ABDOMEN AND PELVIS WITH CONTRAST
2 of 5 series · 15 of 46 positions shown, 17 images · IV contrast (OMNIPAQUE)
Comparison: 10/28/2017.

CLINICAL DATA: Ovarian cancer.

EXAM:
CT ABDOMEN AND PELVIS WITH CONTRAST
TECHNIQUE: Multidetector CT imaging of the abdomen and pelvis was performed
using the standard protocol following bolus administration of
intravenous contrast.
CONTRAST:  100mL OMNIPAQUE IOHEXOL 300 MG/ML  SOLN

[Series 2: axial st · axial · 0.84mm/px · z∈[-506,-91]mm · 12 of 97 slices shown, 14 images]
[im 7/97  soft-tissue]
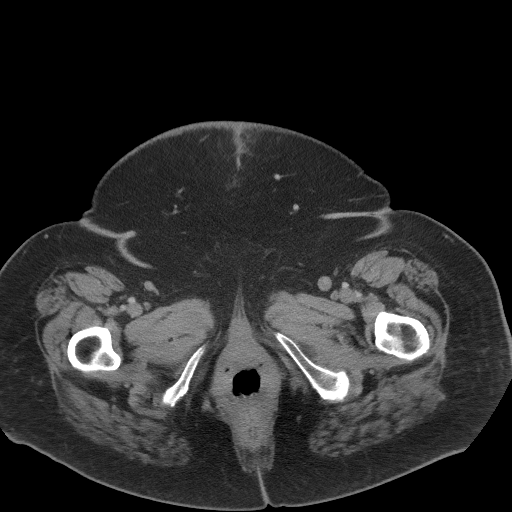
[im 7/97  bone]
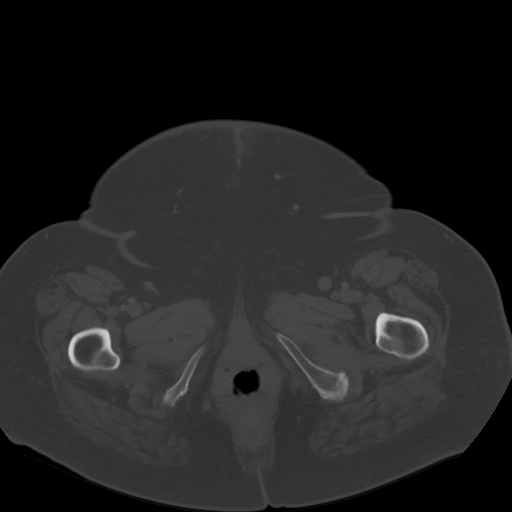
[im 13/97  soft-tissue]
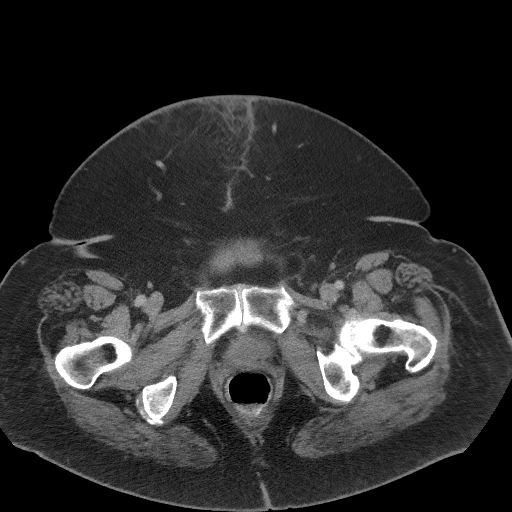
[im 20/97  soft-tissue]
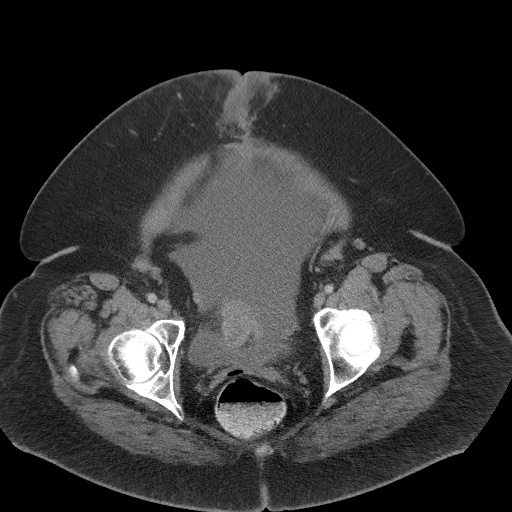
[im 33/97  soft-tissue]
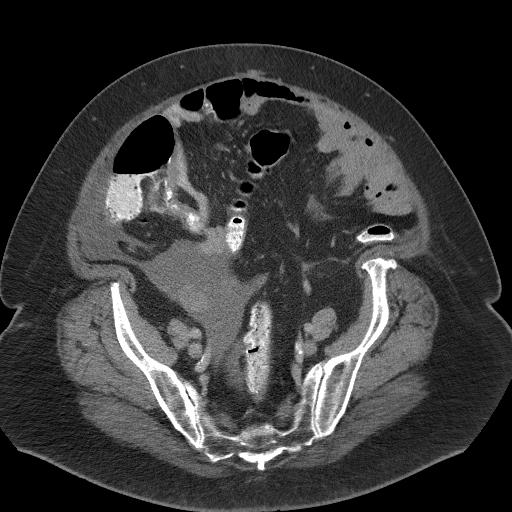
[im 39/97  soft-tissue]
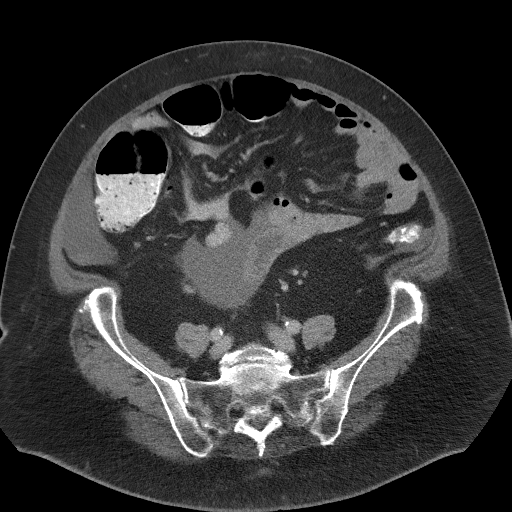
[im 45/97  soft-tissue]
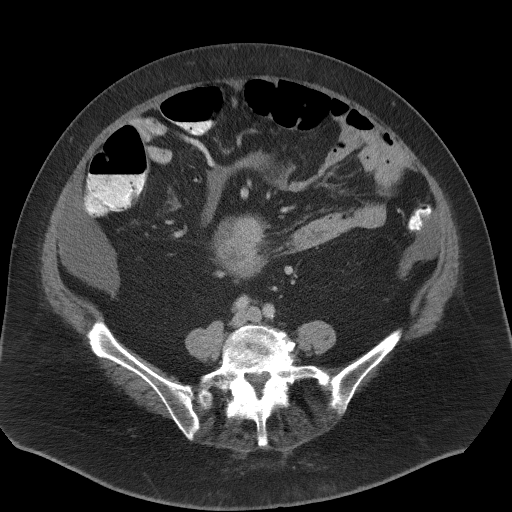
[im 52/97  soft-tissue]
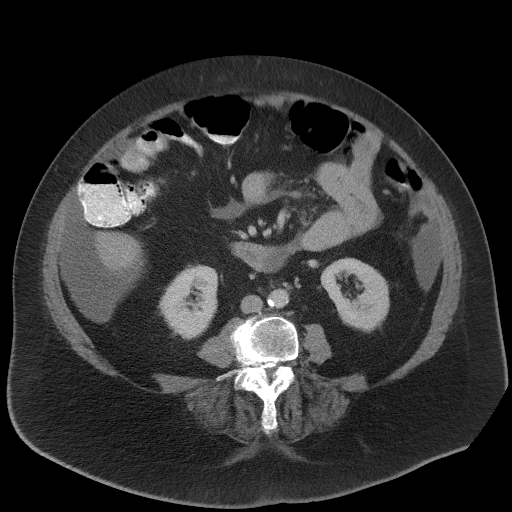
[im 58/97  soft-tissue]
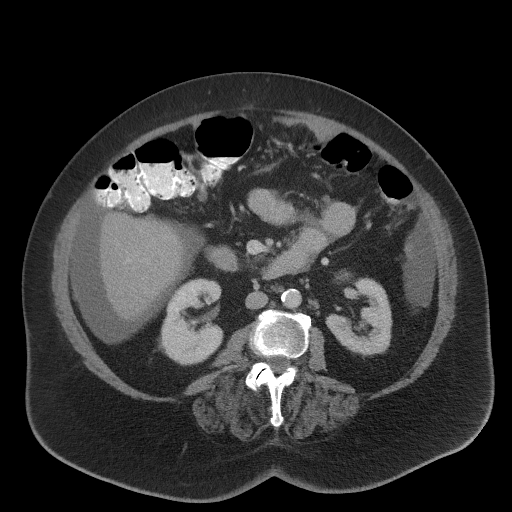
[im 65/97  soft-tissue]
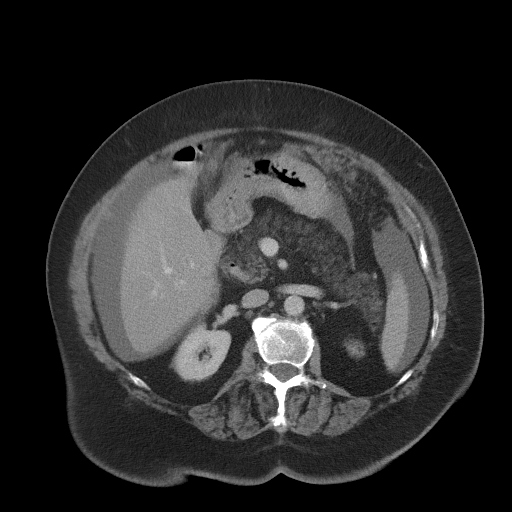
[im 65/97  bone]
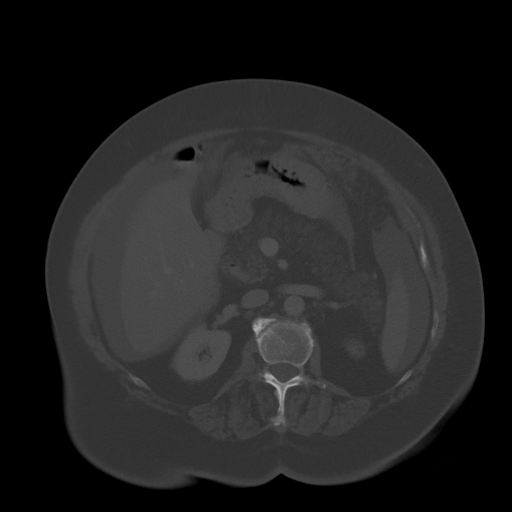
[im 77/97  soft-tissue]
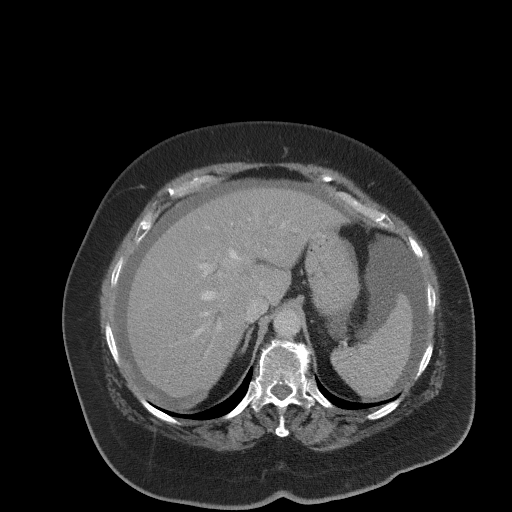
[im 84/97  soft-tissue]
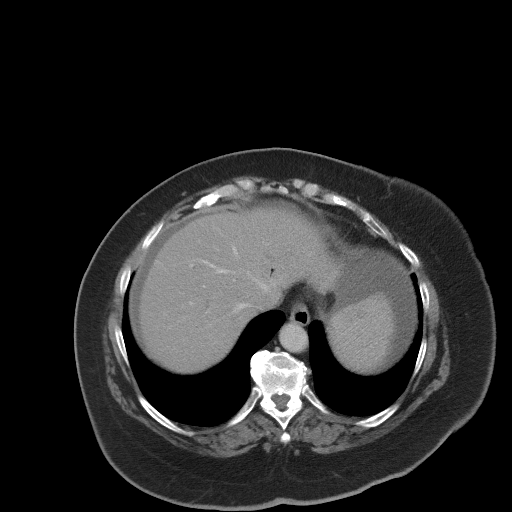
[im 90/97  soft-tissue]
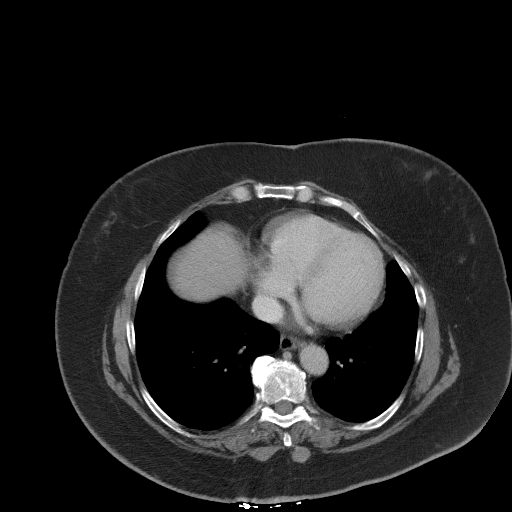

[Series 4: coronal st · coronal · 0.92mm/px · 3 of 118 slices shown]
[im 40/118  soft-tissue]
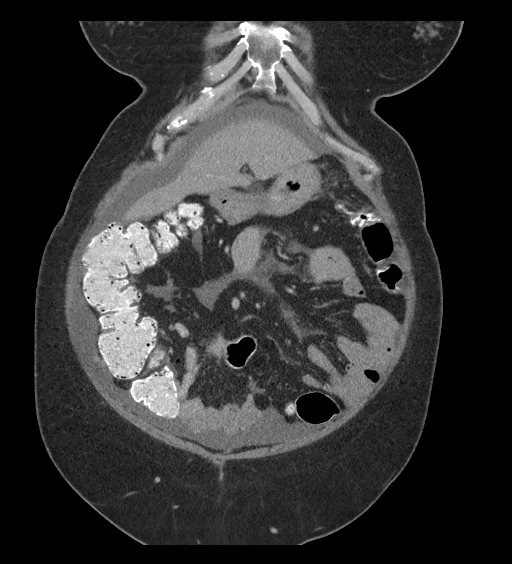
[im 53/118  soft-tissue]
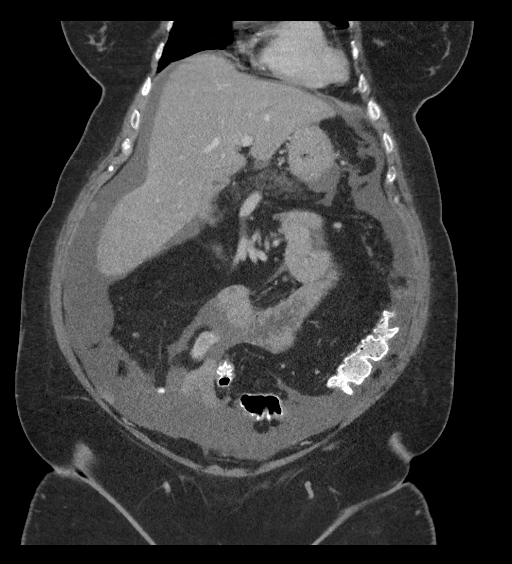
[im 66/118  soft-tissue]
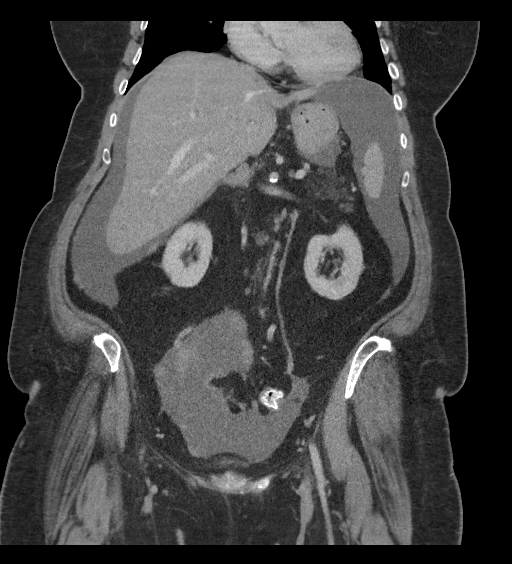

[15 of 46 positions shown; findings below may reference images not displayed]

FINDINGS: Lower chest: Coronary artery calcification is evident.

Hepatobiliary: No suspicious focal abnormality within the liver
parenchyma. Mild intrahepatic biliary duct prominence noted. Trace
pneumobilia in the left liver suggests prior sphincterotomy.
Gallbladder surgically absent.

Pancreas: Pancreas diffusely atrophic.

Spleen: No splenomegaly. No focal mass lesion.

Adrenals/Urinary Tract: No adrenal nodule or mass. Kidneys
unremarkable. No evidence for hydroureter. Bladder is nondistended.

Stomach/Bowel: Tiny hiatal hernia. Stomach otherwise unremarkable.
Duodenum is normally positioned as is the ligament of Treitz. No
small bowel wall thickening. No small bowel dilatation. The terminal
ileum is normal. The appendix is normal. No gross colonic mass. No
colonic wall thickening.

Vascular/Lymphatic: There is abdominal aortic atherosclerosis
without aneurysm. There is no gastrohepatic or hepatoduodenal
ligament lymphadenopathy. No intraperitoneal or retroperitoneal
lymphadenopathy. No pelvic sidewall lymphadenopathy.

Reproductive: The uterus is surgically absent. There is no adnexal
mass.

Other: Interval development of moderate ascites around the liver and
spleen with moderate fluid in the pelvis. Nodular soft tissue is
identified in the gastrocolic ligament including a confluent 6.5 x
2.6 cm soft tissue lesion on 36/2. Multiple mesenteric and
peritoneal soft tissue nodules are new in the interval. 2.1 x 2.7 cm
peritoneal nodule is seen in the right pelvis on [DATE]. Multiple
nodules are seen along the left paracolic gutter (44/2) and in the
peritoneum along the right paracolic gutter (49/2). 2.6 x 3.9 cm
soft tissue lesion is identified in the right pelvis on [DATE] and a
2.4 x 2.9 cm lesion is noted in the left cul-de-sac on 75/2. A 3.7 x
3.4 cm lesion is noted along the bladder dome (78/2).

Musculoskeletal: Soft tissue protruding through the paraumbilical
rectus sheath (74/2) is probably decompressed small bowel, but
metastatic anterior abdominal wall deposit not excluded. No
worrisome lytic or sclerotic osseous abnormality. Bilateral pars
interarticularis defects are noted at L5. Stable inferior endplate
compression fracture at T12.
IMPRESSION: 1. Interval development of multiple soft tissue lesions in the
gastrocolic ligament, mesentery, and along the peritoneal surface.
These soft tissue lesions measure up to 6.5 cm in size. Associated
interval development of moderate volume ascites in the abdomen and
pelvis. Imaging features are consistent with metastatic disease.
2. Soft tissue projects through the rectus sheath in the right
paraumbilical region, likely herniated small bowel as there is a
large amount of decompressed small bowel does deep to this finding.
However, metastatic deposit at this location is also possibility.
3. Trace pneumobilia in the left liver suggests prior
sphincterotomy.
4.  Aortic Atherosclerois ([id number])

## 2020-08-06 ENCOUNTER — Inpatient Hospital Stay: Payer: Medicare Other

## 2020-08-06 ENCOUNTER — Other Ambulatory Visit: Payer: Self-pay

## 2020-08-06 VITALS — BP 124/73 | HR 90 | Temp 98.6°F | Resp 18

## 2020-08-06 DIAGNOSIS — C561 Malignant neoplasm of right ovary: Secondary | ICD-10-CM

## 2020-08-06 DIAGNOSIS — Z7189 Other specified counseling: Secondary | ICD-10-CM

## 2020-08-06 DIAGNOSIS — Z5111 Encounter for antineoplastic chemotherapy: Secondary | ICD-10-CM | POA: Diagnosis not present

## 2020-08-06 LAB — CMP (CANCER CENTER ONLY)
ALT: 29 U/L (ref 0–44)
AST: 32 U/L (ref 15–41)
Albumin: 3.1 g/dL — ABNORMAL LOW (ref 3.5–5.0)
Alkaline Phosphatase: 146 U/L — ABNORMAL HIGH (ref 38–126)
Anion gap: 11 (ref 5–15)
BUN: 21 mg/dL (ref 8–23)
CO2: 24 mmol/L (ref 22–32)
Calcium: 8.8 mg/dL — ABNORMAL LOW (ref 8.9–10.3)
Chloride: 106 mmol/L (ref 98–111)
Creatinine: 0.72 mg/dL (ref 0.44–1.00)
GFR, Estimated: >60 mL/min (ref >60)
Glucose, Bld: 114 mg/dL — ABNORMAL HIGH (ref 70–99)
Potassium: 3.9 mmol/L (ref 3.5–5.1)
Sodium: 141 mmol/L (ref 135–145)
Total Bilirubin: 0.3 mg/dL (ref 0.3–1.2)
Total Protein: 7.5 g/dL (ref 6.5–8.1)

## 2020-08-06 LAB — CBC WITH DIFFERENTIAL (CANCER CENTER ONLY)
Abs Immature Granulocytes: 0.02 10*3/uL (ref 0.00–0.07)
Basophils Absolute: 0 10*3/uL (ref 0.0–0.1)
Basophils Relative: 1 %
Eosinophils Absolute: 0 10*3/uL (ref 0.0–0.5)
Eosinophils Relative: 1 %
HCT: 29.8 % — ABNORMAL LOW (ref 36.0–46.0)
Hemoglobin: 9.2 g/dL — ABNORMAL LOW (ref 12.0–15.0)
Immature Granulocytes: 1 %
Lymphocytes Relative: 23 %
Lymphs Abs: 0.8 10*3/uL (ref 0.7–4.0)
MCH: 31.6 pg (ref 26.0–34.0)
MCHC: 30.9 g/dL (ref 30.0–36.0)
MCV: 102.4 fL — ABNORMAL HIGH (ref 80.0–100.0)
Monocytes Absolute: 0.3 10*3/uL (ref 0.1–1.0)
Monocytes Relative: 10 %
Neutro Abs: 2.1 10*3/uL (ref 1.7–7.7)
Neutrophils Relative %: 64 %
Platelet Count: 254 10*3/uL (ref 150–400)
RBC: 2.91 MIL/uL — ABNORMAL LOW (ref 3.87–5.11)
RDW: 15.6 % — ABNORMAL HIGH (ref 11.5–15.5)
WBC Count: 3.3 10*3/uL — ABNORMAL LOW (ref 4.0–10.5)
nRBC: 0 % (ref 0.0–0.2)

## 2020-08-06 MED ORDER — SODIUM CHLORIDE 0.9 % IV SOLN
80.0000 mg/m2 | Freq: Once | INTRAVENOUS | Status: AC
  Administered 2020-08-06: 162 mg via INTRAVENOUS
  Filled 2020-08-06: qty 27

## 2020-08-06 MED ORDER — SODIUM CHLORIDE 0.9 % IV SOLN
Freq: Once | INTRAVENOUS | Status: AC
  Filled 2020-08-06: qty 250

## 2020-08-06 MED ORDER — SODIUM CHLORIDE 0.9 % IV SOLN
20.0000 mg | Freq: Once | INTRAVENOUS | Status: AC
  Administered 2020-08-06: 20 mg via INTRAVENOUS
  Filled 2020-08-06: qty 20

## 2020-08-06 MED ORDER — SODIUM CHLORIDE 0.9% FLUSH
10.0000 mL | INTRAVENOUS | Status: DC | PRN
  Administered 2020-08-06: 10 mL
  Filled 2020-08-06: qty 10

## 2020-08-06 MED ORDER — FAMOTIDINE IN NACL 20-0.9 MG/50ML-% IV SOLN
INTRAVENOUS | Status: AC
  Filled 2020-08-06: qty 50

## 2020-08-06 MED ORDER — SODIUM CHLORIDE 0.9% FLUSH
10.0000 mL | Freq: Once | INTRAVENOUS | Status: AC
  Administered 2020-08-06: 10 mL
  Filled 2020-08-06: qty 10

## 2020-08-06 MED ORDER — FAMOTIDINE IN NACL 20-0.9 MG/50ML-% IV SOLN
20.0000 mg | Freq: Once | INTRAVENOUS | Status: AC
  Administered 2020-08-06: 20 mg via INTRAVENOUS

## 2020-08-06 MED ORDER — HEPARIN SOD (PORK) LOCK FLUSH 100 UNIT/ML IV SOLN
500.0000 [IU] | Freq: Once | INTRAVENOUS | Status: AC | PRN
  Administered 2020-08-06: 500 [IU]
  Filled 2020-08-06: qty 5

## 2020-08-06 MED ORDER — DIPHENHYDRAMINE HCL 50 MG/ML IJ SOLN
25.0000 mg | Freq: Once | INTRAMUSCULAR | Status: AC
  Administered 2020-08-06: 25 mg via INTRAVENOUS

## 2020-08-06 MED ORDER — DIPHENHYDRAMINE HCL 50 MG/ML IJ SOLN
INTRAMUSCULAR | Status: AC
  Filled 2020-08-06: qty 1

## 2020-08-06 NOTE — Patient Instructions (Signed)
[facility] Discharge Instructions for Patients Receiving Chemotherapy  Today you received the following chemotherapy agents: Paclitaxel (Taxol)  To help prevent nausea and vomiting after your treatment, we encourage you to take your nausea medication as prescribed. If you develop nausea and vomiting that is not controlled by your nausea medication, call the clinic.   BELOW ARE SYMPTOMS THAT SHOULD BE REPORTED IMMEDIATELY:  *FEVER GREATER THAN 100.5 F  *CHILLS WITH OR WITHOUT FEVER  NAUSEA AND VOMITING THAT IS NOT CONTROLLED WITH YOUR NAUSEA MEDICATION  *UNUSUAL SHORTNESS OF BREATH  *UNUSUAL BRUISING OR BLEEDING  TENDERNESS IN MOUTH AND THROAT WITH OR WITHOUT PRESENCE OF ULCERS  *URINARY PROBLEMS  *BOWEL PROBLEMS  UNUSUAL RASH Items with * indicate a potential emergency and should be followed up as soon as possible.  Feel free to call the clinic should you have any questions or concerns. The clinic phone number is [phone].  Please show the CHEMO ALERT CARD at check-in to the Emergency Department and triage nurse.   

## 2020-08-17 ENCOUNTER — Inpatient Hospital Stay: Payer: Medicare Other

## 2020-08-17 ENCOUNTER — Inpatient Hospital Stay (HOSPITAL_BASED_OUTPATIENT_CLINIC_OR_DEPARTMENT_OTHER): Payer: Medicare Other | Admitting: Hematology and Oncology

## 2020-08-17 ENCOUNTER — Inpatient Hospital Stay: Payer: Medicare Other | Attending: Gynecology

## 2020-08-17 ENCOUNTER — Encounter: Payer: Self-pay | Admitting: Hematology and Oncology

## 2020-08-17 ENCOUNTER — Telehealth: Payer: Self-pay | Admitting: Hematology and Oncology

## 2020-08-17 ENCOUNTER — Other Ambulatory Visit: Payer: Self-pay

## 2020-08-17 DIAGNOSIS — C561 Malignant neoplasm of right ovary: Secondary | ICD-10-CM

## 2020-08-17 DIAGNOSIS — E119 Type 2 diabetes mellitus without complications: Secondary | ICD-10-CM | POA: Diagnosis not present

## 2020-08-17 DIAGNOSIS — Z5111 Encounter for antineoplastic chemotherapy: Secondary | ICD-10-CM | POA: Insufficient documentation

## 2020-08-17 DIAGNOSIS — T451X5A Adverse effect of antineoplastic and immunosuppressive drugs, initial encounter: Secondary | ICD-10-CM

## 2020-08-17 DIAGNOSIS — Z7189 Other specified counseling: Secondary | ICD-10-CM

## 2020-08-17 DIAGNOSIS — G620 Drug-induced polyneuropathy: Secondary | ICD-10-CM | POA: Insufficient documentation

## 2020-08-17 DIAGNOSIS — D539 Nutritional anemia, unspecified: Secondary | ICD-10-CM | POA: Diagnosis not present

## 2020-08-17 LAB — CBC WITH DIFFERENTIAL (CANCER CENTER ONLY)
Abs Immature Granulocytes: 0.05 10*3/uL (ref 0.00–0.07)
Basophils Absolute: 0 10*3/uL (ref 0.0–0.1)
Basophils Relative: 1 %
Eosinophils Absolute: 0 10*3/uL (ref 0.0–0.5)
Eosinophils Relative: 1 %
HCT: 26 % — ABNORMAL LOW (ref 36.0–46.0)
Hemoglobin: 8.1 g/dL — ABNORMAL LOW (ref 12.0–15.0)
Immature Granulocytes: 1 %
Lymphocytes Relative: 20 %
Lymphs Abs: 0.8 10*3/uL (ref 0.7–4.0)
MCH: 31.5 pg (ref 26.0–34.0)
MCHC: 31.2 g/dL (ref 30.0–36.0)
MCV: 101.2 fL — ABNORMAL HIGH (ref 80.0–100.0)
Monocytes Absolute: 0.4 10*3/uL (ref 0.1–1.0)
Monocytes Relative: 9 %
Neutro Abs: 2.8 10*3/uL (ref 1.7–7.7)
Neutrophils Relative %: 68 %
Platelet Count: 273 10*3/uL (ref 150–400)
RBC: 2.57 MIL/uL — ABNORMAL LOW (ref 3.87–5.11)
RDW: 15.8 % — ABNORMAL HIGH (ref 11.5–15.5)
WBC Count: 4.2 10*3/uL (ref 4.0–10.5)
nRBC: 0 % (ref 0.0–0.2)

## 2020-08-17 LAB — CMP (CANCER CENTER ONLY)
ALT: 44 U/L (ref 0–44)
AST: 39 U/L (ref 15–41)
Albumin: 3.1 g/dL — ABNORMAL LOW (ref 3.5–5.0)
Alkaline Phosphatase: 187 U/L — ABNORMAL HIGH (ref 38–126)
Anion gap: 11 (ref 5–15)
BUN: 19 mg/dL (ref 8–23)
CO2: 22 mmol/L (ref 22–32)
Calcium: 7.8 mg/dL — ABNORMAL LOW (ref 8.9–10.3)
Chloride: 107 mmol/L (ref 98–111)
Creatinine: 0.71 mg/dL (ref 0.44–1.00)
GFR, Estimated: >60 mL/min (ref >60)
Glucose, Bld: 115 mg/dL — ABNORMAL HIGH (ref 70–99)
Potassium: 3.7 mmol/L (ref 3.5–5.1)
Sodium: 140 mmol/L (ref 135–145)
Total Bilirubin: 0.4 mg/dL (ref 0.3–1.2)
Total Protein: 7.1 g/dL (ref 6.5–8.1)

## 2020-08-17 MED ORDER — DIPHENHYDRAMINE HCL 50 MG/ML IJ SOLN
INTRAMUSCULAR | Status: AC
  Filled 2020-08-17: qty 1

## 2020-08-17 MED ORDER — SODIUM CHLORIDE 0.9% FLUSH
10.0000 mL | INTRAVENOUS | Status: DC | PRN
  Administered 2020-08-17: 10 mL
  Filled 2020-08-17: qty 10

## 2020-08-17 MED ORDER — SODIUM CHLORIDE 0.9 % IV SOLN
20.0000 mg | Freq: Once | INTRAVENOUS | Status: AC
  Administered 2020-08-17: 20 mg via INTRAVENOUS
  Filled 2020-08-17: qty 20

## 2020-08-17 MED ORDER — HEPARIN SOD (PORK) LOCK FLUSH 100 UNIT/ML IV SOLN
500.0000 [IU] | Freq: Once | INTRAVENOUS | Status: AC | PRN
  Administered 2020-08-17: 500 [IU]
  Filled 2020-08-17: qty 5

## 2020-08-17 MED ORDER — SODIUM CHLORIDE 0.9% FLUSH
10.0000 mL | Freq: Once | INTRAVENOUS | Status: AC
  Administered 2020-08-17: 10 mL
  Filled 2020-08-17: qty 10

## 2020-08-17 MED ORDER — FAMOTIDINE IN NACL 20-0.9 MG/50ML-% IV SOLN
INTRAVENOUS | Status: AC
  Filled 2020-08-17: qty 50

## 2020-08-17 MED ORDER — PACLITAXEL CHEMO INJECTION 300 MG/50ML
80.0000 mg/m2 | Freq: Once | INTRAVENOUS | Status: AC
  Administered 2020-08-17: 162 mg via INTRAVENOUS
  Filled 2020-08-17: qty 27

## 2020-08-17 MED ORDER — SODIUM CHLORIDE 0.9 % IV SOLN
Freq: Once | INTRAVENOUS | Status: AC
  Filled 2020-08-17: qty 250

## 2020-08-17 MED ORDER — DIPHENHYDRAMINE HCL 50 MG/ML IJ SOLN
25.0000 mg | Freq: Once | INTRAMUSCULAR | Status: AC
  Administered 2020-08-17: 25 mg via INTRAVENOUS

## 2020-08-17 MED ORDER — FAMOTIDINE IN NACL 20-0.9 MG/50ML-% IV SOLN
20.0000 mg | Freq: Once | INTRAVENOUS | Status: AC
  Administered 2020-08-17: 20 mg via INTRAVENOUS

## 2020-08-17 NOTE — Assessment & Plan Note (Signed)
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

## 2020-08-17 NOTE — Assessment & Plan Note (Signed)
She denies worsening peripheral neuropathy with treatment We will observe carefully while on therapy

## 2020-08-17 NOTE — Assessment & Plan Note (Signed)
Overall, she tolerated treatment very poorly with progressive anemia Her tumor marker appears to be trending down She missed her treatment recently due to inclement weather but frankly with her severe anemia, I think is appropriate to proceed with treatment today and then take 2 weeks break next week and resume treatment early March I plan to order CT imaging for objective assessment of response to Rx

## 2020-08-17 NOTE — Patient Instructions (Signed)

## 2020-08-17 NOTE — Telephone Encounter (Signed)
Scheduled appts per 2/11 sch msg. Gave pt a print out of AVS.

## 2020-08-17 NOTE — Assessment & Plan Note (Signed)
She has slight progressive weight loss through aggressive dietary changes I encouraged her to continue the same

## 2020-08-17 NOTE — Progress Notes (Signed)
[facility] OFFICE PROGRESS NOTE  Patient Care Team: Lilian Coma., MD as PCP - General (Internal Medicine)  ASSESSMENT & PLAN:  Ovarian cancer on right ([place]) Overall, she tolerated treatment very poorly with progressive anemia Her tumor marker appears to be trending down She missed her treatment recently due to inclement weather but frankly with her severe anemia, I think is appropriate to proceed with treatment today and then take 2 weeks break next week and resume treatment early March I plan to order CT imaging for objective assessment of response to Rx  Deficiency anemia She has multifactorial anemia, 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 Iron and TIBC    Standing Status:   Future    Standing Expiration Date:   08/17/2021  . Ferritin    Standing Status:   Future    Standing Expiration Date:   08/17/2021  . Vitamin B12    Standing Status:   Future    Standing Expiration Date:   08/17/2021    All questions were answered. The patient knows to call the clinic with any problems, questions or concerns. The total time spent in the appointment was 30 minutes encounter with patients including review of chart and various tests results, discussions about plan of care and coordination of care plan   Heath Lark, MD 08/17/2020 9:35 AM  INTERVAL HISTORY: Please see below for problem oriented charting. She returns with her husband for further follow-up She complained of some mild fatigue Denies chest pain or shortness of breath No  worsening peripheral neuropathy The patient denies any recent signs or symptoms of bleeding such as spontaneous epistaxis, hematuria or hematochezia. 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    03/20/2017 Imaging   GYN ultrasound was performed this revealed a small fundus measuring 5 cm there are 2 small fibroids within the fundus with the largest measuring 1.3 cm Within the endometrium there is a small amount a fluid the endometrial wall was slightly thickened at 4.8 mm the ultrasound was consistent with a small endometrial polyp  The right adnexa measured 7.4 x 6.7 cm contained a both a cystic and solid mass with a small amount of shadowing This is consistent with a dermoid tumor the left ovary was 2.6 x 1.5 x 1.0 cm There is no other adnexal masses no free fluid within the abdomen no ascites     04/10/2017 Surgery   Operation: Operative Laparscopy with  Peritoneal biopsy 2 hysteroscopy D&C Surgeon: Lynder Parents MD  Specimens: Peritoneal biopsy and cellular washings  Complications: None FIndings Small amount of ascites was found cellular  washings were sent There was peritoneal studding And omental caking Even in steep Trendelenburg the ovaries and uterus were unable to be visualized due to the small intestines in the lower pelvis     04/10/2017 Pathology Results   Biopsy from [facility] was nondiagnostic.  Peritoneum biopsy did not reveal malignancy.  Endometrial biopsy also did not  reveal malignancy.   04/13/2017 Tumor Marker   Patient's tumor was tested for the following markers: CA-125 Results of the tumor marker test revealed 324.1   04/20/2017 Tumor Marker   Patient's tumor was tested for the following markers: CA-125 Results of the tumor marker test revealed 345.6   04/22/2017 Pathology Results   Peritoneum, biopsy - CARCINOMA. - SEE COMMENT.   04/22/2017 Procedure   CT-guided core biopsy performed of peritoneal tumor in the left lower quadrant.   04/24/2017 - 09/25/2017 Chemotherapy   She received carboplatin and Taxol x 3 cycles followed by interval surgical debulking on 12/31. Chemotherapy was subsequently resumed for 3 more cycles   06/05/2017 Tumor Marker   Patient's tumor was tested for the following markers: CA-125 Results of the tumor marker test revealed 27.5   06/17/2017 Imaging   1. Considerable improvement in the prior omental caking, which now presents mainly as an indistinct stranding with slight nodularity along the left upper quadrant omentum. 2. Stable appearance of right ovarian mass with fatty and calcific elements classic for a dermoid. 3. There is accentuated enhancement in the walls of the common hepatic duct and common bile duct. A low-grade cholangitis is not readily excluded. 4. Other imaging findings of potential clinical significance: Mild cardiomegaly. Aortic Atherosclerosis (ICD10-I70.0). Lumbar scoliosis, spondylosis, degenerative disc disease, and pars defects at L5. These cause impingement at L5-S1 and lesser impingement at L3-4 and L4-5. 5. 65% compression fracture at L1 slightly worsened than on the prior exam, with associated vertebral sclerosis and posterior retropulsion.   07/06/2017 Surgery   Bilateral Salpingo-Oophorectomy W/Omentectomy, Total Abd Hysterectomy & Radical Dissection For Debulking Right - Ureterolysis, With Or Without Repositioning Of Ureter For Retroperitoneal Fibrosis   07/06/2017 Pathology Results    A: Omentum, omentectomy - Omentum with minimal residual carcinoma, consistent with high grade serous carcinoma (microscopic; stage ypT3a) - Extensive treatment effect with calcifications, fibrosis, histiocytes, and giant cell response - CRS score 3  B: Uterus with cervix and left ovary and fallopian tube, hysterectomy and left salpingo-oophorectomy - Cervix: Ectocervix and endocervix with no dysplasia or malignancy identified - Endometrium: Inactive with no hyperplasia, atypia, or malignancy identified - Myometrium: Adenomyosis and benign leiomyoma, size 1.2 cm - Serosa: Focal calcification and histiocytes suggestive of treatment effect, with no viable carcinoma identified - Ovary, left: Physiologic changes and no carcinoma identified - Fallopian tube, left: 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 and no malignancy identified  C: Ovary and fallopian tube, right, salpingo-oophorectomy - Right fallopian tube with focal residual carcinoma, consistent with high grade serous carcinoma, and serous tubal intraepithelial carcinoma (STIC) - Associated calcifications and histiocytes suggestive of treatment response - Right ovary with calcifications and histiocytes suggestive of treatment effect, with no definite viable carcinoma identified - Mature cystic teratoma, 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 - See synoptic report and comment   08/14/2017 Tumor Marker   Patient's tumor was tested for the following markers: CA-125 Results of the tumor marker test revealed 14.6   09/04/2017 Tumor Marker   Patient's tumor was tested for the following markers: CA-125 Results of the tumor marker test revealed 12.5  10/03/2017 Genetic Testing   The Common Hereditary Cancer Panel offered by [facility] includes sequencing and/or deletion duplication testing of the following 47 genes: APC, ATM, AXIN2, BARD1, BMPR1A, BRCA1, BRCA2, BRIP1, CDH1, CDKN2A (p14ARF), CDKN2A (p16INK4a), CKD4, CHEK2, CTNNA1, DICER1, EPCAM (Deletion/duplication  testing only), GREM1 (promoter region deletion/duplication testing only), KIT, MEN1, MLH1, MSH2, MSH3, MSH6, MUTYH, NBN, NF1, NHTL1, PALB2, PDGFRA, PMS2, POLD1, POLE, PTEN, RAD50, RAD51C, RAD51D, SDHB, SDHC, SDHD, SMAD4, SMARCA4. STK11, TP53, TSC1, TSC2, and VHL.  The following genes were evaluated for sequence changes only: SDHA and HOXB13 c.251G>A variant only.  Results: Negative, no pathogenic variants identified.  The date of this test report is 10/03/2017.    10/28/2017 Imaging   1. Stable pulmonary nodules. No new or progressive findings. 2. No residual or recurrent omental disease is identified. No obvious peritoneal surface disease. 3. Increasing soft tissue density and ill-defined interstitial changes around the abdominal wall surgical incision. This may be progressive postoperative changes or granulation tissue but attention on future scans to exclude the possibility of tumor. 4. Status post resection of right adnexal mass. No pelvic mass or pelvic adenopathy. 5. Stable T12 compression fracture.   01/25/2018 Tumor Marker   Patient's tumor was tested for the following markers: CA-125 Results of the tumor marker test revealed 9.2   12/10/2018 Tumor Marker   Patient's tumor was tested for the following markers: CA-125 Results of the tumor marker test revealed 212   12/22/2018 Imaging   1. Interval development of multiple soft tissue lesions in the gastrocolic ligament, mesentery, and along the peritoneal surface. These soft tissue lesions measure up to 6.5 cm in size. Associated interval development of moderate volume ascites in the abdomen and pelvis. Imaging features are consistent with metastatic disease. 2. Soft tissue projects through the rectus sheath in the right paraumbilical region, likely herniated small bowel as there is a large amount of decompressed small bowel does deep to this finding. However, metastatic deposit at this location is also possibility.  3. Trace pneumobilia in  the left liver suggests prior sphincterotomy. 4.  Aortic Atherosclerois (ICD10-170.0)   12/30/2018 Procedure   Successful ultrasound-guided therapeutic paracentesis yielding 4.5 liters of peritoneal fluid.   12/30/2018 Procedure   Technically successful right IJ power-injectable port catheter placement. Ready for routine use.   12/31/2018 -  Chemotherapy   The patient had carboplatin and taxol for chemotherapy treatment.     01/21/2019 Tumor Marker   Patient's tumor was tested for the following markers: CA-125 Results of the tumor marker test revealed 408   02/11/2019 Tumor Marker   Patient's tumor was tested for the following markers: CA-125 Results of the tumor marker test revealed 646.   03/02/2019 Imaging   1. Marked improvement in peritoneal carcinomatosis/ascites, without complete resolution. 2.  Aortic atherosclerosis (ICD10-170.0).     03/25/2019 Tumor Marker   Patient's tumor was tested for the following markers: CA-125 Results of the tumor marker test revealed 24.1.   04/15/2019 Tumor Marker   Patient's tumor was tested for the following markers: CA-125 Results of the tumor marker test revealed 15.3   05/18/2019 Imaging   1. There is progressive, nearly complete resolution of peritoneal and omental nodularity seen on prior examination with very faint residual peritoneal and omental haziness (series 2, image 50, 39).   2. Interval resolution of ascites.   3.  Status post hysterectomy and oophorectomy.   4.  Coronary artery disease.  Aortic Atherosclerosis (ICD10-I70.0).      Tumor Marker  Patient's tumor was tested for the following markers: CA-125 Results of the tumor marker test revealed 11.2.   06/10/2019 Tumor Marker   Patient's tumor was tested for the following markers: CA-125 Results of the tumor marker test revealed 9.1   07/29/2019 Tumor Marker   Patient's tumor was tested for the following markers: CA-125 Results of the tumor marker test revealed 8.8    08/18/2019 Imaging   1. No evidence of new or progressive metastatic disease in the abdomen or pelvis. Stable small nodular soft tissue focus at the right umbilicus. No new peritoneal implants. No ascites. 2. Chronic findings include: Aortic Atherosclerosis (ICD10-I70.0). Coronary atherosclerosis. Chronic moderate T12 vertebral compression fractures.   09/09/2019 Tumor Marker   Patient's tumor was tested for the following markers: CA-125 Results of the tumor marker test revealed 11.4.   10/21/2019 Tumor Marker   Patient's tumor was tested for the following markers: CA-125 Results of the tumor marker test revealed 21.5   10/31/2019 Imaging   1. New hepatic and peritoneal metastatic disease. 2.  Aortic atherosclerosis (ICD10-I70.0).     11/11/2019 -  Chemotherapy   The patient had carboplatin and gemzar for chemotherapy treatment.     11/11/2019 Tumor Marker   Patient's tumor was tested for the following markers: CA-125 Results of the tumor marker test revealed 40.2    Genetic Testing   Patient has genetic testing done for PD-L1. Results revealed patient has the following:  PD-L1 staining in tumor cells (TC): <1% PD-L1 staining in tumor-associated immune cells (IC): 0% PD-L1 combined positive score (CPS): <1%   11/11/2019 - 05/11/2020 Chemotherapy   The patient had carboplatin and gemzar for chemotherapy treatment    12/16/2019 Tumor Marker   Patient's tumor was tested for the following markers: CA-125 Results of the tumor marker test revealed 41.7   01/13/2020 Tumor Marker   Patient's tumor was tested for the following markers: CA-125 Results of the tumor marker test revealed 18.9   02/09/2020 Imaging   1. Positive response to therapy. 2. Hypodense lesions in liver are smaller or no longer measurable. 3. No clear residual peritoneal metastasis. 4. No ascites. 5. Persistent enhancement of the common bile duct suggest biliary inflammation.     02/10/2020 Tumor Marker   Patient's  tumor was tested for the following markers: CA-125 Results of the tumor marker test revealed 13.2.   03/19/2020 Tumor Marker   Patient's tumor was tested for the following markers: CA-125 Results of the tumor marker test revealed 15.5   04/13/2020 Tumor Marker   Patient's tumor was tested for the following markers: CA-`125 Results of the tumor marker test revealed 17.9.   05/11/2020 Tumor Marker   Patient's tumor was tested for the following markers: CA-125 Results of the tumor marker test revealed 20.1.   05/24/2020 Imaging   1. Interval progression of soft tissue nodularity in the cul-de-sac, along the vaginal cuff. 2. Interval progression of inferior right liver lesion with new tiny capsular soft tissue lesions along the left liver and inferior right liver. Imaging features are highly concerning for metastatic disease. 3. Small volume ascites. 4. Similar appearance of focal enhancement in the region of the umbilicus. 5. Aortic Atherosclerosis (ICD10-I70.0).   06/01/2020 -  Chemotherapy   The patient had taxol for chemotherapy treatment.     06/08/2020 Tumor Marker   Patient's tumor was tested for the following markers: CA-125 Results of the tumor marker test revealed 26   07/13/2020 Tumor Marker   Patient's tumor was tested  for the following markers: CA-125. Results of the tumor marker test revealed 17.3     REVIEW OF SYSTEMS:   Constitutional: Denies fevers, chills or abnormal weight loss Eyes: Denies blurriness of vision Ears, nose, mouth, throat, and face: Denies mucositis or sore throat Respiratory: Denies cough, dyspnea or wheezes Cardiovascular: Denies palpitation, chest discomfort or lower extremity swelling Gastrointestinal:  Denies nausea, heartburn or change in bowel habits Skin: Denies abnormal skin rashes Lymphatics: Denies new lymphadenopathy or easy bruising Neurological:Denies numbness, tingling or new weaknesses Behavioral/Psych: Mood is stable, no new  changes  All other systems were reviewed with the patient and are negative.  I have reviewed the past medical history, past surgical history, social history and family history with the patient and they are unchanged from previous note.  ALLERGIES:  is allergic to aspirin, nsaids, and tramadol.  MEDICATIONS:  Current Outpatient Medications  Medication Sig Dispense Refill  . acetaminophen (TYLENOL) 650 MG CR tablet Take 1,300 mg by mouth every 8 (eight) hours.     Marland Kitchen alendronate (FOSAMAX) 70 MG tablet Take 70 mg by mouth every Sunday.     Marland Kitchen atorvastatin (LIPITOR) 80 MG tablet Take 40 mg by mouth at bedtime.     . Cholecalciferol (VITAMIN D3) 2000 units TABS Take 2,000 Units by mouth daily.    . citalopram (CELEXA) 10 MG tablet Take 10 mg by mouth at bedtime    . ferrous sulfate 325 (65 FE) MG tablet Take 325 mg by mouth daily. 3 hours after meal and 30 min before a meal    . gabapentin (NEURONTIN) 300 MG capsule Take by mouth.    . Glucosamine Sulfate 500 MG TABS Take 500 mg by mouth 2 (two) times daily.     Marland Kitchen lidocaine-prilocaine (EMLA) cream Apply to affected area once 30 g 3  . loratadine (CLARITIN) 10 MG tablet Take 10 mg by mouth at bedtime.     Marland Kitchen losartan (COZAAR) 25 MG tablet Take 25 mg by mouth daily.     . meclizine (ANTIVERT) 25 MG tablet Take 25 mg by mouth 2 (two) times daily as needed for dizziness.     . metFORMIN (GLUCOPHAGE) 1000 MG tablet Take 1,000 mg by mouth 2 (two) times daily with a meal.     . omeprazole (PRILOSEC) 40 MG capsule Take 40 mg by mouth daily.     . ondansetron (ZOFRAN) 8 MG tablet Take 1 tablet (8 mg total) by mouth every 8 (eight) hours as needed for refractory nausea / vomiting. 30 tablet 1  . prochlorperazine (COMPAZINE) 10 MG tablet Take 1 tablet (10 mg total) by mouth every 6 (six) hours as needed (Nausea or vomiting). 60 tablet 1  . Pyridoxine HCl (VITAMIN B-6 PO) Take 50 mg by mouth 1 day or 1 dose.    . vitamin B-12 (CYANOCOBALAMIN) 1000 MCG tablet  Take 1,000 mcg by mouth daily.      No current facility-administered medications for this visit.   Facility-Administered Medications Ordered in Other Visits  Medication Dose Route Frequency Provider Last Rate Last Admin  . dexamethasone (DECADRON) 20 mg in sodium chloride 0.9 % 50 mL IVPB  20 mg Intravenous Once [name], Yalitza Teed, MD      . famotidine (PEPCID) IVPB 20 mg premix  20 mg Intravenous Once [name], Davonne Jarnigan, MD 200 mL/hr at 08/17/20 0933 20 mg at 08/17/20 0933  . heparin lock flush 100 unit/mL  500 Units Intracatheter Once PRN Heath Lark, MD      .  PACLitaxel (TAXOL) 162 mg in sodium chloride 0.9 % 250 mL chemo infusion (</= 44m/m2)  80 mg/m2 (Treatment Plan Recorded) Intravenous Once Ronte Parker, MD      . sodium chloride flush (NS) 0.9 % injection 10 mL  10 mL Intracatheter Once GAlvy Bimler Toribio Seiber, MD      . sodium chloride flush (NS) 0.9 % injection 10 mL  10 mL Intracatheter PRN GAlvy Bimler Erline Siddoway, MD        PHYSICAL EXAMINATION: ECOG PERFORMANCE STATUS: 2 - Symptomatic, <50% confined to bed  Vitals:   08/17/20 0849  BP: (!) 113/51  Pulse: 92  Resp: 18  Temp: 98.8 F (37.1 C)  SpO2: 98%   Filed Weights   08/17/20 0849  Weight: 185 lb (83.9 kg)    GENERAL:alert, no distress and comfortable She looks pale SKIN: skin color, texture, turgor are normal, no rashes or significant lesions EYES: normal, Conjunctiva are pink and non-injected, sclera clear OROPHARYNX:no exudate, no erythema and lips, buccal mucosa, and tongue normal  NECK: supple, thyroid normal size, non-tender, without nodularity LYMPH:  no palpable lymphadenopathy in the cervical, axillary or inguinal LUNGS: clear to auscultation and percussion with normal breathing effort HEART: regular rate & rhythm and no murmurs and no lower extremity edema ABDOMEN:abdomen soft, non-tender and normal bowel sounds Musculoskeletal:no cyanosis of digits and no clubbing  NEURO: alert & oriented x 3 with fluent speech, no focal motor/sensory  deficits  LABORATORY DATA:  I have reviewed the data as listed    Component Value Date/Time   NA 140 08/17/2020 0835   NA 139 06/05/2017 0746   K 3.7 08/17/2020 0835   K 4.1 06/05/2017 0746   CL 107 08/17/2020 0835   CO2 22 08/17/2020 0835   CO2 21 (L) 06/05/2017 0746   GLUCOSE 115 (H) 08/17/2020 0835   GLUCOSE 279 (H) 06/05/2017 0746   BUN 19 08/17/2020 0835   BUN 15.0 06/05/2017 0746   CREATININE 0.71 08/17/2020 0835   CREATININE 0.9 06/05/2017 0746   CALCIUM 7.8 (L) 08/17/2020 0835   CALCIUM 9.1 01/13/2020 0735   CALCIUM 10.1 06/05/2017 0746   PROT 7.1 08/17/2020 0835   PROT 8.1 06/05/2017 0746   ALBUMIN 3.1 (L) 08/17/2020 0835   ALBUMIN 4.0 06/05/2017 0746   AST 39 08/17/2020 0835   AST 22 06/05/2017 0746   ALT 44 08/17/2020 0835   ALT 36 06/05/2017 0746   ALKPHOS 187 (H) 08/17/2020 0835   ALKPHOS 98 06/05/2017 0746   BILITOT 0.4 08/17/2020 0835   BILITOT 0.44 06/05/2017 0746   GFRNONAA >60 08/17/2020 0835   GFRAA >60 03/30/2020 0734    No results found for: SPEP, UPEP  Lab Results  Component Value Date   WBC 4.2 08/17/2020   NEUTROABS 2.8 08/17/2020   HGB 8.1 (L) 08/17/2020   HCT 26.0 (L) 08/17/2020   MCV 101.2 (H) 08/17/2020   PLT 273 08/17/2020      Chemistry      Component Value Date/Time   NA 140 08/17/2020 0835   NA 139 06/05/2017 0746   K 3.7 08/17/2020 0835   K 4.1 06/05/2017 0746   CL 107 08/17/2020 0835   CO2 22 08/17/2020 0835   CO2 21 (L) 06/05/2017 0746   BUN 19 08/17/2020 0835   BUN 15.0 06/05/2017 0746   CREATININE 0.71 08/17/2020 0835   CREATININE 0.9 06/05/2017 0746      Component Value Date/Time   CALCIUM 7.8 (L) 08/17/2020 0835   CALCIUM 9.1 01/13/2020 0735  CALCIUM 10.1 06/05/2017 0746   ALKPHOS 187 (H) 08/17/2020 0835   ALKPHOS 98 06/05/2017 0746   AST 39 08/17/2020 0835   AST 22 06/05/2017 0746   ALT 44 08/17/2020 0835   ALT 36 06/05/2017 0746   BILITOT 0.4 08/17/2020 0835   BILITOT 0.44 06/05/2017 0746

## 2020-08-17 NOTE — Patient Instructions (Signed)
[facility] Discharge Instructions for Patients Receiving Chemotherapy  Today you received the following chemotherapy agents: paclitaxel.  To help prevent nausea and vomiting after your treatment, we encourage you to take your nausea medication as directed.   If you develop nausea and vomiting that is not controlled by your nausea medication, call the clinic.   BELOW ARE SYMPTOMS THAT SHOULD BE REPORTED IMMEDIATELY:  *FEVER GREATER THAN 100.5 F  *CHILLS WITH OR WITHOUT FEVER  NAUSEA AND VOMITING THAT IS NOT CONTROLLED WITH YOUR NAUSEA MEDICATION  *UNUSUAL SHORTNESS OF BREATH  *UNUSUAL BRUISING OR BLEEDING  TENDERNESS IN MOUTH AND THROAT WITH OR WITHOUT PRESENCE OF ULCERS  *URINARY PROBLEMS  *BOWEL PROBLEMS  UNUSUAL RASH Items with * indicate a potential emergency and should be followed up as soon as possible.  Feel free to call the clinic should you have any questions or concerns. The clinic phone number is [phone].  Please show the CHEMO ALERT CARD at check-in to the Emergency Department and triage nurse.   

## 2020-08-18 LAB — CA 125: Cancer Antigen (CA) 125: 16.7 U/mL (ref 0.0–38.1)

## 2020-08-20 ENCOUNTER — Other Ambulatory Visit: Payer: Self-pay | Admitting: Hematology and Oncology

## 2020-08-20 DIAGNOSIS — C561 Malignant neoplasm of right ovary: Secondary | ICD-10-CM

## 2020-09-06 ENCOUNTER — Other Ambulatory Visit: Payer: Self-pay

## 2020-09-06 ENCOUNTER — Ambulatory Visit (HOSPITAL_COMMUNITY)
Admission: RE | Admit: 2020-09-06 | Discharge: 2020-09-06 | Disposition: A | Discharge status: Home / Self Care | Payer: Medicare Other | Source: Ambulatory Visit | Attending: Hematology and Oncology | Admitting: Hematology and Oncology

## 2020-09-06 DIAGNOSIS — C561 Malignant neoplasm of right ovary: Secondary | ICD-10-CM | POA: Insufficient documentation

## 2020-09-06 MED ORDER — IOHEXOL 300 MG/ML  SOLN
100.0000 mL | Freq: Once | INTRAMUSCULAR | Status: AC | PRN
  Administered 2020-09-06: 100 mL via INTRAVENOUS

## 2020-09-06 MED ORDER — IOHEXOL 9 MG/ML PO SOLN
ORAL | Status: AC
  Filled 2020-09-06: qty 1000

## 2020-09-06 MED ORDER — HEPARIN SOD (PORK) LOCK FLUSH 100 UNIT/ML IV SOLN
500.0000 [IU] | Freq: Once | INTRAVENOUS | Status: AC
  Administered 2020-09-06: 500 [IU] via INTRAVENOUS

## 2020-09-06 MED ORDER — IOHEXOL 9 MG/ML PO SOLN
1000.0000 mL | ORAL | Status: AC
  Administered 2020-09-06: 1000 mL via ORAL

## 2020-09-06 MED ORDER — HEPARIN SOD (PORK) LOCK FLUSH 100 UNIT/ML IV SOLN
INTRAVENOUS | Status: AC
  Filled 2020-09-06: qty 5

## 2020-09-06 MED FILL — Dexamethasone Sodium Phosphate Inj 100 MG/10ML: INTRAMUSCULAR | Qty: 2 | Status: AC

## 2020-09-07 ENCOUNTER — Inpatient Hospital Stay: Payer: Medicare Other

## 2020-09-07 ENCOUNTER — Inpatient Hospital Stay: Payer: Medicare Other | Attending: Gynecology | Admitting: Hematology and Oncology

## 2020-09-07 ENCOUNTER — Other Ambulatory Visit: Payer: Self-pay

## 2020-09-07 ENCOUNTER — Encounter: Payer: Self-pay | Admitting: Hematology and Oncology

## 2020-09-07 ENCOUNTER — Telehealth: Payer: Self-pay | Admitting: Hematology and Oncology

## 2020-09-07 ENCOUNTER — Other Ambulatory Visit: Payer: Self-pay | Admitting: Hematology and Oncology

## 2020-09-07 DIAGNOSIS — C561 Malignant neoplasm of right ovary: Secondary | ICD-10-CM

## 2020-09-07 DIAGNOSIS — Z5111 Encounter for antineoplastic chemotherapy: Secondary | ICD-10-CM | POA: Insufficient documentation

## 2020-09-07 DIAGNOSIS — D539 Nutritional anemia, unspecified: Secondary | ICD-10-CM | POA: Diagnosis not present

## 2020-09-07 DIAGNOSIS — D6481 Anemia due to antineoplastic chemotherapy: Secondary | ICD-10-CM | POA: Insufficient documentation

## 2020-09-07 DIAGNOSIS — C787 Secondary malignant neoplasm of liver and intrahepatic bile duct: Secondary | ICD-10-CM | POA: Diagnosis not present

## 2020-09-07 DIAGNOSIS — Z452 Encounter for adjustment and management of vascular access device: Secondary | ICD-10-CM | POA: Diagnosis not present

## 2020-09-07 DIAGNOSIS — Z7189 Other specified counseling: Secondary | ICD-10-CM

## 2020-09-07 LAB — CBC WITH DIFFERENTIAL (CANCER CENTER ONLY)
Abs Immature Granulocytes: 0.02 10*3/uL (ref 0.00–0.07)
Basophils Absolute: 0 10*3/uL (ref 0.0–0.1)
Basophils Relative: 1 %
Eosinophils Absolute: 0.1 10*3/uL (ref 0.0–0.5)
Eosinophils Relative: 2 %
HCT: 27.5 % — ABNORMAL LOW (ref 36.0–46.0)
Hemoglobin: 8.5 g/dL — ABNORMAL LOW (ref 12.0–15.0)
Immature Granulocytes: 0 %
Lymphocytes Relative: 18 %
Lymphs Abs: 0.9 10*3/uL (ref 0.7–4.0)
MCH: 31.4 pg (ref 26.0–34.0)
MCHC: 30.9 g/dL (ref 30.0–36.0)
MCV: 101.5 fL — ABNORMAL HIGH (ref 80.0–100.0)
Monocytes Absolute: 0.5 10*3/uL (ref 0.1–1.0)
Monocytes Relative: 9 %
Neutro Abs: 3.4 10*3/uL (ref 1.7–7.7)
Neutrophils Relative %: 70 %
Platelet Count: 283 10*3/uL (ref 150–400)
RBC: 2.71 MIL/uL — ABNORMAL LOW (ref 3.87–5.11)
RDW: 17.2 % — ABNORMAL HIGH (ref 11.5–15.5)
WBC Count: 4.9 10*3/uL (ref 4.0–10.5)
nRBC: 0 % (ref 0.0–0.2)

## 2020-09-07 LAB — CMP (CANCER CENTER ONLY)
ALT: 92 U/L — ABNORMAL HIGH (ref 0–44)
AST: 83 U/L — ABNORMAL HIGH (ref 15–41)
Albumin: 3.2 g/dL — ABNORMAL LOW (ref 3.5–5.0)
Alkaline Phosphatase: 211 U/L — ABNORMAL HIGH (ref 38–126)
Anion gap: 10 (ref 5–15)
BUN: 14 mg/dL (ref 8–23)
CO2: 26 mmol/L (ref 22–32)
Calcium: 8.8 mg/dL — ABNORMAL LOW (ref 8.9–10.3)
Chloride: 102 mmol/L (ref 98–111)
Creatinine: 0.71 mg/dL (ref 0.44–1.00)
GFR, Estimated: >60 mL/min (ref >60)
Glucose, Bld: 118 mg/dL — ABNORMAL HIGH (ref 70–99)
Potassium: 4.6 mmol/L (ref 3.5–5.1)
Sodium: 138 mmol/L (ref 135–145)
Total Bilirubin: 0.4 mg/dL (ref 0.3–1.2)
Total Protein: 7.2 g/dL (ref 6.5–8.1)

## 2020-09-07 LAB — IRON AND TIBC
Iron: 32 ug/dL — ABNORMAL LOW (ref 41–142)
Saturation Ratios: 13 % — ABNORMAL LOW (ref 21–57)
TIBC: 255 ug/dL (ref 236–444)
UIBC: 223 ug/dL (ref 120–384)

## 2020-09-07 LAB — VITAMIN B12: Vitamin B-12: 1242 pg/mL — ABNORMAL HIGH (ref 180–914)

## 2020-09-07 LAB — FERRITIN: Ferritin: 642 ng/mL — ABNORMAL HIGH (ref 11–307)

## 2020-09-07 MED ORDER — ONDANSETRON HCL 8 MG PO TABS: 8.0000 mg | ORAL_TABLET | Freq: Three times a day (TID) | ORAL | 1 refills | Status: DC | PRN

## 2020-09-07 MED ORDER — SODIUM CHLORIDE 0.9% FLUSH
10.0000 mL | Freq: Once | INTRAVENOUS | Status: AC
  Administered 2020-09-07: 10 mL
  Filled 2020-09-07: qty 10

## 2020-09-07 NOTE — Telephone Encounter (Signed)
Scheduled appts per 3/3 los.  Gave pt a print out of appt calendar

## 2020-09-07 NOTE — Assessment & Plan Note (Addendum)
She has signs of metastatic disease to the liver Her liver enzymes are elevated We will proceed with treatment as scheduled and will monitor carefully

## 2020-09-07 NOTE — Assessment & Plan Note (Signed)
We have numerous goals of care discussions in the past She is aware that treatment goal is palliative We discussed prognosis with or without treatment

## 2020-09-07 NOTE — Patient Instructions (Signed)

## 2020-09-07 NOTE — Assessment & Plan Note (Signed)
She has multifactorial anemia but repeat iron studies and vitamin B12 show no evidence of iron deficiency This is related to multiple chronic exposure to chemotherapy She is not symptomatic She does not need transfusion support now but likely will need transfusion support after chemotherapy and I plan to see her within a week afterwards to recheck her blood

## 2020-09-07 NOTE — Assessment & Plan Note (Signed)
Unfortunately, she has signs of disease progression Her liver enzymes are also elevated consistent with metastatic disease to her liver She is not symptomatic We reviewed the guidelines We discussed the risk, benefits, side effects of pursuing further palliative chemotherapy and she wants to proceed Previously, she did not progress on carboplatin We will resume carboplatin with reduced dose at AUC of 4 due to her severe anemia I will schedule her chemo to start next week and I plan to see her within a week afterwards for transfusion support The risks, benefits, side effects of carboplatin were discussed with the patient and family and she is in agreement to proceed

## 2020-09-07 NOTE — Progress Notes (Signed)
[place] OFFICE PROGRESS NOTE  Patient Care Team: Lilian Coma., MD as PCP - General (Internal Medicine)  ASSESSMENT & PLAN:  Ovarian cancer on right [facility]) Unfortunately, she has signs of disease progression Her liver enzymes are also elevated consistent with metastatic disease to her liver She is not symptomatic We reviewed the guidelines We discussed the risk, benefits, side effects of pursuing further palliative chemotherapy and she wants to proceed Previously, she did not progress on carboplatin We will resume carboplatin with reduced dose at AUC of 4 due to her severe anemia I will schedule her chemo to start next week and I plan to see her within a week afterwards for transfusion support The risks, benefits, side effects of carboplatin were discussed with the patient and family and she is in agreement to proceed  Deficiency anemia She has multifactorial anemia but repeat iron studies and vitamin B12 show no evidence of iron deficiency This is related to multiple chronic exposure to chemotherapy She is not symptomatic She does not need transfusion support now but likely will need transfusion support after chemotherapy and I plan to see her within a week afterwards to recheck her blood  Metastasis to liver [facility]) She has signs of metastatic disease to the liver Her liver enzymes are elevated We will proceed with treatment as scheduled and will monitor carefully  Goals of care, counseling/discussion We have numerous goals of care discussions in the past She is aware that treatment goal is palliative We discussed prognosis with or without treatment   No orders of the defined types were placed in this encounter.   All questions were answered. The patient knows to call the clinic with any problems, questions or concerns. The total time spent in the appointment was 40 minutes encounter with patients including review of chart and various tests results,  discussions about plan of care and coordination of care plan   Heath Lark, MD 09/07/2020 11:24 AM  INTERVAL HISTORY: Please see below for problem oriented charting. 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    03/20/2017 Imaging   GYN ultrasound was performed this revealed a small fundus measuring 5 cm there are 2 small fibroids within the fundus with the largest measuring 1.3 cm Within the endometrium there is a small amount a fluid the endometrial wall was slightly thickened at 4.8 mm the ultrasound was consistent with a small endometrial polyp  The right adnexa measured 7.4 x 6.7 cm contained a both a cystic and solid mass with a small amount of shadowing This is consistent with a dermoid tumor the left ovary was 2.6 x 1.5 x 1.0 cm There is no other adnexal masses no free fluid within the abdomen no ascites     04/10/2017 Surgery   Operation: Operative Laparscopy with  Peritoneal biopsy 2 hysteroscopy D&C Surgeon: Lynder Parents MD  Specimens: Peritoneal biopsy and  cellular washings  Complications: None FIndings Small amount of ascites was found cellular washings were sent There was peritoneal studding And omental caking Even in steep Trendelenburg the ovaries and uterus were unable to be visualized due to the small intestines in the lower pelvis     04/10/2017  Pathology Results   Biopsy from [facility] was nondiagnostic.  Peritoneum biopsy did not reveal malignancy.  Endometrial biopsy also did not reveal malignancy.   04/13/2017 Tumor Marker   Patient's tumor was tested for the following markers: CA-125 Results of the tumor marker test revealed 324.1   04/20/2017 Tumor Marker   Patient's tumor was tested for the following markers: CA-125 Results of the tumor marker test revealed 345.6   04/22/2017 Pathology Results   Peritoneum, biopsy - CARCINOMA. - SEE COMMENT.   04/22/2017 Procedure   CT-guided core biopsy performed of peritoneal tumor in the left lower quadrant.   04/24/2017 - 09/25/2017 Chemotherapy   She received carboplatin and Taxol x 3 cycles followed by interval surgical debulking on 12/31. Chemotherapy was subsequently resumed for 3 more cycles   06/05/2017 Tumor Marker   Patient's tumor was tested for the following markers: CA-125 Results of the tumor marker test revealed 27.5   06/17/2017 Imaging   1. Considerable improvement in the prior omental caking, which now presents mainly as an indistinct stranding with slight nodularity along the left upper quadrant omentum. 2. Stable appearance of right ovarian mass with fatty and calcific elements classic for a dermoid. 3. There is accentuated enhancement in the walls of the common hepatic duct and common bile duct. A low-grade cholangitis is not readily excluded. 4. Other imaging findings of potential clinical significance: Mild cardiomegaly. Aortic Atherosclerosis (ICD10-I70.0). Lumbar scoliosis, spondylosis, degenerative disc disease, and pars defects at L5. These cause impingement at L5-S1 and lesser impingement at L3-4 and L4-5. 5. 65% compression fracture at L1 slightly worsened than on the prior exam, with associated vertebral sclerosis and posterior retropulsion.   07/06/2017 Surgery   Bilateral Salpingo-Oophorectomy W/Omentectomy, Total Abd  Hysterectomy & Radical Dissection For Debulking Right - Ureterolysis, With Or Without Repositioning Of Ureter For Retroperitoneal Fibrosis   07/06/2017 Pathology Results   A: Omentum, omentectomy - Omentum with minimal residual carcinoma, consistent with high grade serous carcinoma (microscopic; stage ypT3a) - Extensive treatment effect with calcifications, fibrosis, histiocytes, and giant cell response - CRS score 3  B: Uterus with cervix and left ovary and fallopian tube, hysterectomy and left salpingo-oophorectomy - Cervix: Ectocervix and endocervix with no dysplasia or malignancy identified - Endometrium: Inactive with no hyperplasia, atypia, or malignancy identified - Myometrium: Adenomyosis and benign leiomyoma, size 1.2 cm - Serosa: Focal calcification and histiocytes suggestive of treatment effect, with no viable carcinoma identified - Ovary, left: Physiologic changes and no carcinoma identified - Fallopian tube, left: 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 and no malignancy identified  C: Ovary and fallopian tube, right, salpingo-oophorectomy - Right fallopian tube with focal residual carcinoma, consistent with high grade serous carcinoma, and serous tubal intraepithelial carcinoma (STIC) - Associated calcifications and histiocytes suggestive of treatment response - Right ovary with calcifications and histiocytes suggestive of treatment effect, with no definite viable carcinoma identified - Mature cystic teratoma, 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 - See synoptic report and comment   08/14/2017 Tumor Marker   Patient's tumor was tested for the following markers: CA-125 Results of the tumor marker test revealed 14.6   09/04/2017 Tumor Marker   Patient's tumor was tested for the  following markers: CA-125 Results of the tumor marker test revealed 12.5   10/03/2017 Genetic Testing   The Common Hereditary Cancer Panel offered by [facility] includes sequencing and/or deletion duplication testing of the  following 47 genes: APC, ATM, AXIN2, BARD1, BMPR1A, BRCA1, BRCA2, BRIP1, CDH1, CDKN2A (p14ARF), CDKN2A (p16INK4a), CKD4, CHEK2, CTNNA1, DICER1, EPCAM (Deletion/duplication testing only), GREM1 (promoter region deletion/duplication testing only), KIT, MEN1, MLH1, MSH2, MSH3, MSH6, MUTYH, NBN, NF1, NHTL1, PALB2, PDGFRA, PMS2, POLD1, POLE, PTEN, RAD50, RAD51C, RAD51D, SDHB, SDHC, SDHD, SMAD4, SMARCA4. STK11, TP53, TSC1, TSC2, and VHL.  The following genes were evaluated for sequence changes only: SDHA and HOXB13 c.251G>A variant only.  Results: Negative, no pathogenic variants identified.  The date of this test report is 10/03/2017.    10/28/2017 Imaging   1. Stable pulmonary nodules. No new or progressive findings. 2. No residual or recurrent omental disease is identified. No obvious peritoneal surface disease. 3. Increasing soft tissue density and ill-defined interstitial changes around the abdominal wall surgical incision. This may be progressive postoperative changes or granulation tissue but attention on future scans to exclude the possibility of tumor. 4. Status post resection of right adnexal mass. No pelvic mass or pelvic adenopathy. 5. Stable T12 compression fracture.   01/25/2018 Tumor Marker   Patient's tumor was tested for the following markers: CA-125 Results of the tumor marker test revealed 9.2   12/10/2018 Tumor Marker   Patient's tumor was tested for the following markers: CA-125 Results of the tumor marker test revealed 212   12/22/2018 Imaging   1. Interval development of multiple soft tissue lesions in the gastrocolic ligament, mesentery, and along the peritoneal surface. These soft tissue lesions measure up to 6.5 cm in size. Associated interval development of moderate volume ascites in the abdomen and pelvis. Imaging features are consistent with metastatic disease. 2. Soft tissue projects through the rectus sheath in the right paraumbilical region, likely herniated small bowel as  there is a large amount of decompressed small bowel does deep to this finding. However, metastatic deposit at this location is also possibility.  3. Trace pneumobilia in the left liver suggests prior sphincterotomy. 4.  Aortic Atherosclerois (ICD10-170.0)   12/30/2018 Procedure   Successful ultrasound-guided therapeutic paracentesis yielding 4.5 liters of peritoneal fluid.   12/30/2018 Procedure   Technically successful right IJ power-injectable port catheter placement. Ready for routine use.   12/31/2018 -  Chemotherapy   The patient had carboplatin and taxol for chemotherapy treatment.     01/21/2019 Tumor Marker   Patient's tumor was tested for the following markers: CA-125 Results of the tumor marker test revealed 408   02/11/2019 Tumor Marker   Patient's tumor was tested for the following markers: CA-125 Results of the tumor marker test revealed 646.   03/02/2019 Imaging   1. Marked improvement in peritoneal carcinomatosis/ascites, without complete resolution. 2.  Aortic atherosclerosis (ICD10-170.0).     03/25/2019 Tumor Marker   Patient's tumor was tested for the following markers: CA-125 Results of the tumor marker test revealed 24.1.   04/15/2019 Tumor Marker   Patient's tumor was tested for the following markers: CA-125 Results of the tumor marker test revealed 15.3   05/18/2019 Imaging   1. There is progressive, nearly complete resolution of peritoneal and omental nodularity seen on prior examination with very faint residual peritoneal and omental haziness (series 2, image 50, 39).   2. Interval resolution of ascites.   3.  Status post hysterectomy and oophorectomy.   4.  Coronary artery disease.  Aortic Atherosclerosis (ICD10-I70.0).      Tumor Marker   Patient's tumor was tested for the following markers: CA-125 Results of the tumor marker test revealed 11.2.   06/10/2019 Tumor Marker   Patient's tumor was tested for the following markers: CA-125 Results of the  tumor marker test revealed 9.1   07/29/2019 Tumor Marker   Patient's tumor was tested for the following markers: CA-125 Results of the tumor marker test revealed 8.8   08/18/2019 Imaging   1. No evidence of new or progressive metastatic disease in the abdomen or pelvis. Stable small nodular soft tissue focus at the right umbilicus. No new peritoneal implants. No ascites. 2. Chronic findings include: Aortic Atherosclerosis (ICD10-I70.0). Coronary atherosclerosis. Chronic moderate T12 vertebral compression fractures.   09/09/2019 Tumor Marker   Patient's tumor was tested for the following markers: CA-125 Results of the tumor marker test revealed 11.4.   10/21/2019 Tumor Marker   Patient's tumor was tested for the following markers: CA-125 Results of the tumor marker test revealed 21.5   10/31/2019 Imaging   1. New hepatic and peritoneal metastatic disease. 2.  Aortic atherosclerosis (ICD10-I70.0).     11/11/2019 -  Chemotherapy   The patient had carboplatin and gemzar for chemotherapy treatment.     11/11/2019 Tumor Marker   Patient's tumor was tested for the following markers: CA-125 Results of the tumor marker test revealed 40.2    Genetic Testing   Patient has genetic testing done for PD-L1. Results revealed patient has the following:  PD-L1 staining in tumor cells (TC): <1% PD-L1 staining in tumor-associated immune cells (IC): 0% PD-L1 combined positive score (CPS): <1%   11/11/2019 - 05/11/2020 Chemotherapy   The patient had carboplatin and gemzar for chemotherapy treatment    12/16/2019 Tumor Marker   Patient's tumor was tested for the following markers: CA-125 Results of the tumor marker test revealed 41.7   01/13/2020 Tumor Marker   Patient's tumor was tested for the following markers: CA-125 Results of the tumor marker test revealed 18.9   02/09/2020 Imaging   1. Positive response to therapy. 2. Hypodense lesions in liver are smaller or no longer measurable. 3. No clear  residual peritoneal metastasis. 4. No ascites. 5. Persistent enhancement of the common bile duct suggest biliary inflammation.     02/10/2020 Tumor Marker   Patient's tumor was tested for the following markers: CA-125 Results of the tumor marker test revealed 13.2.   03/19/2020 Tumor Marker   Patient's tumor was tested for the following markers: CA-125 Results of the tumor marker test revealed 15.5   04/13/2020 Tumor Marker   Patient's tumor was tested for the following markers: CA-`125 Results of the tumor marker test revealed 17.9.   05/11/2020 Tumor Marker   Patient's tumor was tested for the following markers: CA-125 Results of the tumor marker test revealed 20.1.   05/24/2020 Imaging   1. Interval progression of soft tissue nodularity in the cul-de-sac, along the vaginal cuff. 2. Interval progression of inferior right liver lesion with new tiny capsular soft tissue lesions along the left liver and inferior right liver. Imaging features are highly concerning for metastatic disease. 3. Small volume ascites. 4. Similar appearance of focal enhancement in the region of the umbilicus. 5. Aortic Atherosclerosis (ICD10-I70.0).   06/01/2020 -  Chemotherapy   The patient had taxol for chemotherapy treatment.     06/08/2020 Tumor Marker   Patient's tumor was tested for the following markers: CA-125 Results of the tumor marker test revealed  26   07/13/2020 Tumor Marker   Patient's tumor was tested for the following markers: CA-125. Results of the tumor marker test revealed 17.3   08/17/2020 Tumor Marker   Patient's tumor was tested for the following markers: CA-125. Results of the tumor marker test revealed 16.7   09/06/2020 Imaging   Status post hysterectomy and bilateral salpingo-oophorectomy.   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   Progressive hepatic metastases.   No evidence of metastatic disease in the chest.   09/12/2020 -  Chemotherapy     Patient is on Treatment Plan: OVARIAN CARBOPLATIN AUC 5 Q21D        REVIEW OF SYSTEMS:   Constitutional: Denies fevers, chills or abnormal weight loss Eyes: Denies blurriness of vision Ears, nose, mouth, throat, and face: Denies mucositis or sore throat Respiratory: Denies cough, dyspnea or wheezes Cardiovascular: Denies palpitation, chest discomfort or lower extremity swelling Gastrointestinal:  Denies nausea, heartburn or change in bowel habits Skin: Denies abnormal skin rashes Lymphatics: Denies new lymphadenopathy or easy bruising Neurological:Denies numbness, tingling or new weaknesses Behavioral/Psych: Mood is stable, no new changes  All other systems were reviewed with the patient and are negative.  I have reviewed the past medical history, past surgical history, social history and family history with the patient and they are unchanged from previous note.  ALLERGIES:  is allergic to aspirin, nsaids, and tramadol.  MEDICATIONS:  Current Outpatient Medications  Medication Sig Dispense Refill  . acetaminophen (TYLENOL) 650 MG CR tablet Take 1,300 mg by mouth every 8 (eight) hours.     Marland Kitchen atorvastatin (LIPITOR) 80 MG tablet Take 40 mg by mouth at bedtime.     . Cholecalciferol (VITAMIN D3) 2000 units TABS Take 2,000 Units by mouth daily.    . citalopram (CELEXA) 10 MG tablet Take 10 mg by mouth at bedtime    . ferrous sulfate 325 (65 FE) MG tablet Take 325 mg by mouth daily. 3 hours after meal and 30 min before a meal    . gabapentin (NEURONTIN) 300 MG capsule Take by mouth.    . Glucosamine Sulfate 500 MG TABS Take 500 mg by mouth 2 (two) times daily.     Marland Kitchen lidocaine-prilocaine (EMLA) cream Apply to affected area once 30 g 3  . loratadine (CLARITIN) 10 MG tablet Take 10 mg by mouth at bedtime.     Marland Kitchen losartan (COZAAR) 25 MG tablet Take 25 mg by mouth daily.     . meclizine (ANTIVERT) 25 MG tablet Take 25 mg by mouth 2 (two) times daily as needed for dizziness.     . metFORMIN  (GLUCOPHAGE) 1000 MG tablet Take 1,000 mg by mouth 2 (two) times daily with a meal.     . omeprazole (PRILOSEC) 40 MG capsule Take 40 mg by mouth daily.     . ondansetron (ZOFRAN) 8 MG tablet Take 1 tablet (8 mg total) by mouth every 8 (eight) hours as needed for refractory nausea / vomiting. 30 tablet 1  . prochlorperazine (COMPAZINE) 10 MG tablet Take 1 tablet (10 mg total) by mouth every 6 (six) hours as needed (Nausea or vomiting). 60 tablet 1  . Pyridoxine HCl (VITAMIN B-6 PO) Take 50 mg by mouth 1 day or 1 dose.    . vitamin B-12 (CYANOCOBALAMIN) 1000 MCG tablet Take 1,000 mcg by mouth daily.      No current facility-administered medications for this visit.   Facility-Administered Medications Ordered in Other Visits  Medication Dose Route  Frequency Provider Last Rate Last Admin  . sodium chloride flush (NS) 0.9 % injection 10 mL  10 mL Intracatheter Once [name], Lindzee Gouge, MD        PHYSICAL EXAMINATION: ECOG PERFORMANCE STATUS: 1 - Symptomatic but completely ambulatory  Vitals:   09/07/20 0832  BP: (!) 129/59  Pulse: 86  Resp: 18  Temp: 98.1 F (36.7 C)  SpO2: 97%   Filed Weights   09/07/20 0832  Weight: 184 lb 1.6 oz (83.5 kg)    GENERAL:alert, no distress and comfortable.  She looks pale Musculoskeletal:no cyanosis of digits and no clubbing  NEURO: alert & oriented x 3 with fluent speech, no focal motor/sensory deficits  LABORATORY DATA:  I have reviewed the data as listed    Component Value Date/Time   NA 138 09/07/2020 0815   NA 139 06/05/2017 0746   K 4.6 09/07/2020 0815   K 4.1 06/05/2017 0746   CL 102 09/07/2020 0815   CO2 26 09/07/2020 0815   CO2 21 (L) 06/05/2017 0746   GLUCOSE 118 (H) 09/07/2020 0815   GLUCOSE 279 (H) 06/05/2017 0746   BUN 14 09/07/2020 0815   BUN 15.0 06/05/2017 0746   CREATININE 0.71 09/07/2020 0815   CREATININE 0.9 06/05/2017 0746   CALCIUM 8.8 (L) 09/07/2020 0815   CALCIUM 9.1 01/13/2020 0735   CALCIUM 10.1 06/05/2017 0746   PROT  7.2 09/07/2020 0815   PROT 8.1 06/05/2017 0746   ALBUMIN 3.2 (L) 09/07/2020 0815   ALBUMIN 4.0 06/05/2017 0746   AST 83 (H) 09/07/2020 0815   AST 22 06/05/2017 0746   ALT 92 (H) 09/07/2020 0815   ALT 36 06/05/2017 0746   ALKPHOS 211 (H) 09/07/2020 0815   ALKPHOS 98 06/05/2017 0746   BILITOT 0.4 09/07/2020 0815   BILITOT 0.44 06/05/2017 0746   GFRNONAA >60 09/07/2020 0815   GFRAA >60 03/30/2020 0734    No results found for: SPEP, UPEP  Lab Results  Component Value Date   WBC 4.9 09/07/2020   NEUTROABS 3.4 09/07/2020   HGB 8.5 (L) 09/07/2020   HCT 27.5 (L) 09/07/2020   MCV 101.5 (H) 09/07/2020   PLT 283 09/07/2020      Chemistry      Component Value Date/Time   NA 138 09/07/2020 0815   NA 139 06/05/2017 0746   K 4.6 09/07/2020 0815   K 4.1 06/05/2017 0746   CL 102 09/07/2020 0815   CO2 26 09/07/2020 0815   CO2 21 (L) 06/05/2017 0746   BUN 14 09/07/2020 0815   BUN 15.0 06/05/2017 0746   CREATININE 0.71 09/07/2020 0815   CREATININE 0.9 06/05/2017 0746      Component Value Date/Time   CALCIUM 8.8 (L) 09/07/2020 0815   CALCIUM 9.1 01/13/2020 0735   CALCIUM 10.1 06/05/2017 0746   ALKPHOS 211 (H) 09/07/2020 0815   ALKPHOS 98 06/05/2017 0746   AST 83 (H) 09/07/2020 0815   AST 22 06/05/2017 0746   ALT 92 (H) 09/07/2020 0815   ALT 36 06/05/2017 0746   BILITOT 0.4 09/07/2020 0815   BILITOT 0.44 06/05/2017 0746       RADIOGRAPHIC STUDIES: I have reviewed multiple CT imaging with the patient and family I have personally reviewed the radiological images as listed and agreed with the findings in the report. CT CHEST ABDOMEN PELVIS W CONTRAST  Result Date: 09/07/2020 CLINICAL DATA:  Metastatic ovarian cancer, immunotherapy and chemotherapy complete EXAM: CT CHEST, ABDOMEN, AND PELVIS WITH CONTRAST TECHNIQUE: Multidetector CT imaging of  the chest, abdomen and pelvis was performed following the standard protocol during bolus administration of intravenous contrast. CONTRAST:   142m OMNIPAQUE IOHEXOL 300 MG/ML  SOLN COMPARISON:  CT abdomen/pelvis dated 05/24/2020. FINDINGS: CT CHEST FINDINGS Cardiovascular: Heart is normal in size.  No pericardial effusion. No evidence of thoracic aortic aneurysm. Atherosclerotic calcifications of the aortic arch. Three vessel coronary atherosclerosis. Right chest port terminates the cavoatrial junction. Mediastinum/Nodes: No suspicious mediastinal, hilar, or axillary lymphadenopathy. Visualized thyroid is unremarkable. Lungs/Pleura: 7 mm subpleural nodule in the anterior right middle lobe (series 4/image 78), unchanged from 2019, benign. No new/suspicious pulmonary nodules. Mild linear scarring in the lingula and right lung base. No focal consolidation. No pleural effusion or pneumothorax. Musculoskeletal: Degenerative changes of the thoracic spine. Moderate inferior endplate compression fracture deformity at T12, unchanged. No retropulsion. No focal osseous lesions. CT ABDOMEN PELVIS FINDINGS Hepatobiliary: Multifocal hepatic metastases, approximately 8-10 in number, progressive. Index 2.0 cm lesion inferiorly in segment 6 now measures 3.7 cm (series 2/image 64). Additional 2.8 cm lesion along the gallbladder fossa (series 2/image 64), new. 3.2 cm lesion high in segment 6 (series 2/image 60), new. 1.4 cm lesion in segment 4A (series 2/image 51), new. Status post cholecystectomy. No intrahepatic ductal dilatation. Mildly prominent common duct, measuring 10 mm. No choledocholithiasis is seen. Pancreas: Within normal limits. Spleen: 2.7 cm perisplenic capsular implant along the splenic hilum (series 2/image 52), new. Adrenals/Urinary Tract: Adrenal glands are within normal limits. Kidneys are within normal limits.  No hydronephrosis. Bladder is within normal limits. Stomach/Bowel: Stomach is within normal limits. No evidence of bowel obstruction. Normal appendix (series 2/image 93). Vascular/Lymphatic: No evidence of abdominal aortic aneurysm.  Atherosclerotic calcifications of the abdominal aorta and branch vessels. No suspicious abdominopelvic lymphadenopathy. Reproductive: Status post hysterectomy and bilateral salpingo oophorectomy. Other: No abdominopelvic ascites. 7.4 x 6.3 cm aggregate peritoneal implant in the pelvic cul-de-sac, previously 5.6 x 4.7 cm when measured as a single lesion. Additional peritoneal implants, including: --2.4 cm implant beneath the left upper abdominal wall (series 2/image 57), new --2.0 cm implant along the lateral aspect of the inferior right hepatic lobe, previously 9 mm --Multiple nodules along the left mid abdominal wall measuring up to 14 mm (series 2/image 34), previously a single 12 mm nodule --17 mm implant along the right pericolic gutter (series 2/image 80), new Musculoskeletal: Grade 1 spondylolisthesis at L5-S1 unchanged. Degenerative changes of the lumbar spine. No focal osseous lesions. IMPRESSION: Status post hysterectomy and bilateral salpingo-oophorectomy. 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 Progressive hepatic metastases. No evidence of metastatic disease in the chest. Electronically Signed   By: SJulian HyM.D.   On: 09/07/2020 09:07

## 2020-09-07 NOTE — Progress Notes (Signed)
DISCONTINUE ON PATHWAY REGIMEN - Ovarian     A cycle is every 28 days:     Bevacizumab-xxxx      Paclitaxel   **Always confirm dose/schedule in your pharmacy ordering system**  REASON: Disease Progression PRIOR TREATMENT: [id number]: Paclitaxel 80 mg/m2 Weekly + Bevacizumab 10 mg/kg q2 Weeks, q28 Days; Re-evaluate Every 3 Cycles, Treat until Complete Response, Unacceptable Toxicity, or Disease Progression TREATMENT RESPONSE: Progressive Disease (PD)  START OFF PATHWAY REGIMEN - Ovarian   [id number]:Carboplatin AUC=5 q21 Days:   A cycle is every 21 days:     Carboplatin   **Always confirm dose/schedule in your pharmacy ordering system**  Patient Characteristics: Recurrent or Progressive Disease, Third Line, Platinum Sensitive and ? 6 Months Since Last Platinum Therapy, BRCA Mutation Absent BRCA Mutation Status: Absent Therapeutic Status: Recurrent or Progressive Disease Line of Therapy: Third Line  Intent of Therapy: Non-Curative / Palliative Intent, Discussed with Patient

## 2020-09-12 ENCOUNTER — Inpatient Hospital Stay: Payer: Medicare Other

## 2020-09-12 ENCOUNTER — Other Ambulatory Visit: Payer: Self-pay

## 2020-09-12 VITALS — BP 104/60 | HR 93 | Temp 98.9°F | Resp 18 | Wt 181.1 lb

## 2020-09-12 DIAGNOSIS — C561 Malignant neoplasm of right ovary: Secondary | ICD-10-CM

## 2020-09-12 DIAGNOSIS — Z5111 Encounter for antineoplastic chemotherapy: Secondary | ICD-10-CM | POA: Diagnosis not present

## 2020-09-12 DIAGNOSIS — Z7189 Other specified counseling: Secondary | ICD-10-CM

## 2020-09-12 MED ORDER — FAMOTIDINE IN NACL 20-0.9 MG/50ML-% IV SOLN
INTRAVENOUS | Status: AC
  Filled 2020-09-12: qty 50

## 2020-09-12 MED ORDER — SODIUM CHLORIDE 0.9 % IV SOLN
150.0000 mg | Freq: Once | INTRAVENOUS | Status: AC
  Administered 2020-09-12: 150 mg via INTRAVENOUS
  Filled 2020-09-12: qty 150

## 2020-09-12 MED ORDER — ACETAMINOPHEN 325 MG PO TABS
ORAL_TABLET | ORAL | Status: AC
  Filled 2020-09-12: qty 2

## 2020-09-12 MED ORDER — PALONOSETRON HCL INJECTION 0.25 MG/5ML
0.2500 mg | Freq: Once | INTRAVENOUS | Status: AC
Start: 2020-09-12 — End: 2020-09-12
  Administered 2020-09-12: 0.25 mg via INTRAVENOUS

## 2020-09-12 MED ORDER — SODIUM CHLORIDE 0.9% FLUSH
10.0000 mL | INTRAVENOUS | Status: DC | PRN
  Administered 2020-09-12: 10 mL
  Filled 2020-09-12: qty 10

## 2020-09-12 MED ORDER — SODIUM CHLORIDE 0.9 % IV SOLN
10.0000 mg | Freq: Once | INTRAVENOUS | Status: AC
  Administered 2020-09-12: 10 mg via INTRAVENOUS
  Filled 2020-09-12: qty 10

## 2020-09-12 MED ORDER — SODIUM CHLORIDE 0.9 % IV SOLN
384.0000 mg | Freq: Once | INTRAVENOUS | Status: AC
  Administered 2020-09-12: 380 mg via INTRAVENOUS
  Filled 2020-09-12: qty 38

## 2020-09-12 MED ORDER — ACETAMINOPHEN 325 MG PO TABS: 650.0000 mg | ORAL_TABLET | Freq: Once | ORAL | Status: DC

## 2020-09-12 MED ORDER — DIPHENHYDRAMINE HCL 50 MG/ML IJ SOLN
25.0000 mg | Freq: Once | INTRAMUSCULAR | Status: AC
  Administered 2020-09-12: 25 mg via INTRAVENOUS

## 2020-09-12 MED ORDER — HEPARIN SOD (PORK) LOCK FLUSH 100 UNIT/ML IV SOLN
500.0000 [IU] | Freq: Once | INTRAVENOUS | Status: AC | PRN
  Administered 2020-09-12: 500 [IU]
  Filled 2020-09-12: qty 5

## 2020-09-12 MED ORDER — PALONOSETRON HCL INJECTION 0.25 MG/5ML
INTRAVENOUS | Status: AC
  Filled 2020-09-12: qty 5

## 2020-09-12 MED ORDER — DIPHENHYDRAMINE HCL 50 MG/ML IJ SOLN
INTRAMUSCULAR | Status: AC
  Filled 2020-09-12: qty 1

## 2020-09-12 MED ORDER — FAMOTIDINE IN NACL 20-0.9 MG/50ML-% IV SOLN
20.0000 mg | Freq: Once | INTRAVENOUS | Status: AC
  Administered 2020-09-12: 20 mg via INTRAVENOUS

## 2020-09-12 MED ORDER — SODIUM CHLORIDE 0.9 % IV SOLN
Freq: Once | INTRAVENOUS | Status: AC
  Filled 2020-09-12: qty 250

## 2020-09-12 NOTE — Patient Instructions (Signed)
Carboplatin injection What is this medicine? CARBOPLATIN (KAR boe pla tin) is a chemotherapy drug. It targets fast dividing cells, like cancer cells, and causes these cells to die. This medicine is used to treat ovarian cancer and many other cancers. This medicine may be used for other purposes; ask your health care provider or pharmacist if you have questions. COMMON BRAND NAME(S): Paraplatin What should I tell my health care provider before I take this medicine? They need to know if you have any of these conditions:  blood disorders  hearing problems  kidney disease  recent or ongoing radiation therapy  an unusual or allergic reaction to carboplatin, cisplatin, other chemotherapy, other medicines, foods, dyes, or preservatives  pregnant or trying to get pregnant  breast-feeding How should I use this medicine? This drug is usually given as an infusion into a vein. It is administered in a hospital or clinic by a specially trained health care professional. Talk to your pediatrician regarding the use of this medicine in children. Special care may be needed. Overdosage: If you think you have taken too much of this medicine contact a poison control center or emergency room at once. NOTE: This medicine is only for you. Do not share this medicine with others. What if I miss a dose? It is important not to miss a dose. Call your doctor or health care professional if you are unable to keep an appointment. What may interact with this medicine?  medicines for seizures  medicines to increase blood counts like filgrastim, pegfilgrastim, sargramostim  some antibiotics like amikacin, gentamicin, neomycin, streptomycin, tobramycin  vaccines Talk to your doctor or health care professional before taking any of these medicines:  acetaminophen  aspirin  ibuprofen  ketoprofen  naproxen This list may not describe all possible interactions. Give your health care provider a list of all the  medicines, herbs, non-prescription drugs, or dietary supplements you use. Also tell them if you smoke, drink alcohol, or use illegal drugs. Some items may interact with your medicine. What should I watch for while using this medicine? Your condition will be monitored carefully while you are receiving this medicine. You will need important blood work done while you are taking this medicine. This drug may make you feel generally unwell. This is not uncommon, as chemotherapy can affect healthy cells as well as cancer cells. Report any side effects. Continue your course of treatment even though you feel ill unless your doctor tells you to stop. In some cases, you may be given additional medicines to help with side effects. Follow all directions for their use. Call your doctor or health care professional for advice if you get a fever, chills or sore throat, or other symptoms of a cold or flu. Do not treat yourself. This drug decreases your body's ability to fight infections. Try to avoid being around people who are sick. This medicine may increase your risk to bruise or bleed. Call your doctor or health care professional if you notice any unusual bleeding. Be careful brushing and flossing your teeth or using a toothpick because you may get an infection or bleed more easily. If you have any dental work done, tell your dentist you are receiving this medicine. Avoid taking products that contain aspirin, acetaminophen, ibuprofen, naproxen, or ketoprofen unless instructed by your doctor. These medicines may hide a fever. Do not become pregnant while taking this medicine. Women should inform their doctor if they wish to become pregnant or think they might be pregnant. There is a  potential for serious side effects to an unborn child. Talk to your health care professional or pharmacist for more information. Do not breast-feed an infant while taking this medicine. What side effects may I notice from receiving this  medicine? Side effects that you should report to your doctor or health care professional as soon as possible:  allergic reactions like skin rash, itching or hives, swelling of the face, lips, or tongue  signs of infection - fever or chills, cough, sore throat, pain or difficulty passing urine  signs of decreased platelets or bleeding - bruising, pinpoint red spots on the skin, black, tarry stools, nosebleeds  signs of decreased red blood cells - unusually weak or tired, fainting spells, lightheadedness  breathing problems  changes in hearing  changes in vision  chest pain  high blood pressure  low blood counts - This drug may decrease the number of white blood cells, red blood cells and platelets. You may be at increased risk for infections and bleeding.  nausea and vomiting  pain, swelling, redness or irritation at the injection site  pain, tingling, numbness in the hands or feet  problems with balance, talking, walking  trouble passing urine or change in the amount of urine Side effects that usually do not require medical attention (report to your doctor or health care professional if they continue or are bothersome):  hair loss  loss of appetite  metallic taste in the mouth or changes in taste This list may not describe all possible side effects. Call your doctor for medical advice about side effects. You may report side effects to FDA at 1-800-FDA-1088. Where should I keep my medicine? This drug is given in a hospital or clinic and will not be stored at home. NOTE: This sheet is a summary. It may not cover all possible information. If you have questions about this medicine, talk to your doctor, pharmacist, or health care provider.  2021 Elsevier/[employer] Standard (2007-09-28 14:38:05)  

## 2020-09-21 ENCOUNTER — Inpatient Hospital Stay: Payer: Medicare Other

## 2020-09-21 ENCOUNTER — Other Ambulatory Visit: Payer: Self-pay

## 2020-09-21 ENCOUNTER — Inpatient Hospital Stay (HOSPITAL_BASED_OUTPATIENT_CLINIC_OR_DEPARTMENT_OTHER): Payer: Medicare Other | Admitting: Hematology and Oncology

## 2020-09-21 ENCOUNTER — Encounter: Payer: Self-pay | Admitting: Hematology and Oncology

## 2020-09-21 DIAGNOSIS — D539 Nutritional anemia, unspecified: Secondary | ICD-10-CM

## 2020-09-21 DIAGNOSIS — Z5111 Encounter for antineoplastic chemotherapy: Secondary | ICD-10-CM | POA: Diagnosis not present

## 2020-09-21 DIAGNOSIS — C561 Malignant neoplasm of right ovary: Secondary | ICD-10-CM | POA: Diagnosis not present

## 2020-09-21 DIAGNOSIS — D6481 Anemia due to antineoplastic chemotherapy: Secondary | ICD-10-CM | POA: Diagnosis not present

## 2020-09-21 DIAGNOSIS — C787 Secondary malignant neoplasm of liver and intrahepatic bile duct: Secondary | ICD-10-CM | POA: Diagnosis not present

## 2020-09-21 DIAGNOSIS — T451X5A Adverse effect of antineoplastic and immunosuppressive drugs, initial encounter: Secondary | ICD-10-CM

## 2020-09-21 DIAGNOSIS — Z7189 Other specified counseling: Secondary | ICD-10-CM

## 2020-09-21 LAB — CBC WITH DIFFERENTIAL (CANCER CENTER ONLY)
Abs Immature Granulocytes: 0.03 10*3/uL (ref 0.00–0.07)
Basophils Absolute: 0 10*3/uL (ref 0.0–0.1)
Basophils Relative: 1 %
Eosinophils Absolute: 0.2 10*3/uL (ref 0.0–0.5)
Eosinophils Relative: 2 %
HCT: 26.9 % — ABNORMAL LOW (ref 36.0–46.0)
Hemoglobin: 8.2 g/dL — ABNORMAL LOW (ref 12.0–15.0)
Immature Granulocytes: 1 %
Lymphocytes Relative: 13 %
Lymphs Abs: 0.8 10*3/uL (ref 0.7–4.0)
MCH: 30.8 pg (ref 26.0–34.0)
MCHC: 30.5 g/dL (ref 30.0–36.0)
MCV: 101.1 fL — ABNORMAL HIGH (ref 80.0–100.0)
Monocytes Absolute: 0.5 10*3/uL (ref 0.1–1.0)
Monocytes Relative: 8 %
Neutro Abs: 4.8 10*3/uL (ref 1.7–7.7)
Neutrophils Relative %: 75 %
Platelet Count: 302 10*3/uL (ref 150–400)
RBC: 2.66 MIL/uL — ABNORMAL LOW (ref 3.87–5.11)
RDW: 17.4 % — ABNORMAL HIGH (ref 11.5–15.5)
WBC Count: 6.4 10*3/uL (ref 4.0–10.5)
nRBC: 0 % (ref 0.0–0.2)

## 2020-09-21 LAB — CMP (CANCER CENTER ONLY)
ALT: 87 U/L — ABNORMAL HIGH (ref 0–44)
AST: 68 U/L — ABNORMAL HIGH (ref 15–41)
Albumin: 3 g/dL — ABNORMAL LOW (ref 3.5–5.0)
Alkaline Phosphatase: 363 U/L — ABNORMAL HIGH (ref 38–126)
Anion gap: 8 (ref 5–15)
BUN: 20 mg/dL (ref 8–23)
CO2: 25 mmol/L (ref 22–32)
Calcium: 8.5 mg/dL — ABNORMAL LOW (ref 8.9–10.3)
Chloride: 103 mmol/L (ref 98–111)
Creatinine: 0.77 mg/dL (ref 0.44–1.00)
GFR, Estimated: >60 mL/min (ref >60)
Glucose, Bld: 137 mg/dL — ABNORMAL HIGH (ref 70–99)
Potassium: 4.2 mmol/L (ref 3.5–5.1)
Sodium: 136 mmol/L (ref 135–145)
Total Bilirubin: 0.4 mg/dL (ref 0.3–1.2)
Total Protein: 7.2 g/dL (ref 6.5–8.1)

## 2020-09-21 LAB — SAMPLE TO BLOOD BANK

## 2020-09-21 MED ORDER — SODIUM CHLORIDE 0.9% FLUSH
10.0000 mL | Freq: Once | INTRAVENOUS | Status: AC
  Administered 2020-09-21: 10 mL
  Filled 2020-09-21: qty 10

## 2020-09-21 NOTE — Patient Instructions (Signed)
Implanted Port Insertion, Care After This sheet gives you information about how to care for yourself after your procedure. Your health care provider may also give you more specific instructions. If you have problems or questions, contact your health care provider. What can I expect after the procedure? After the procedure, it is common to have:  Discomfort at the port insertion site.  Bruising on the skin over the port. This should improve over 3-4 days. Follow these instructions at home: Port care  After your port is placed, you will get a manufacturer's information card. The card has information about your port. Keep this card with you at all times.  Take care of the port as told by your health care provider. Ask your health care provider if you or a family member can get training for taking care of the port at home. A home health care nurse may also take care of the port.  Make sure to remember what type of port you have. Incision care  Follow instructions from your health care provider about how to take care of your port insertion site. Make sure you: ? Wash your hands with soap and water before and after you change your bandage (dressing). If soap and water are not available, use hand sanitizer. ? Change your dressing as told by your health care provider. ? Leave stitches (sutures), skin glue, or adhesive strips in place. These skin closures may need to stay in place for 2 weeks or longer. If adhesive strip edges start to loosen and curl up, you may trim the loose edges. Do not remove adhesive strips completely unless your health care provider tells you to do that.  Check your port insertion site every day for signs of infection. Check for: ? Redness, swelling, or pain. ? Fluid or blood. ? Warmth. ? Pus or a bad smell.      Activity  Return to your normal activities as told by your health care provider. Ask your health care provider what activities are safe for you.  Do not  lift anything that is heavier than 10 lb (4.5 kg), or the limit that you are told, until your health care provider says that it is safe. General instructions  Take over-the-counter and prescription medicines only as told by your health care provider.  Do not take baths, swim, or use a hot tub until your health care provider approves. Ask your health care provider if you may take showers. You may only be allowed to take sponge baths.  Do not drive for 24 hours if you were given a sedative during your procedure.  Wear a medical alert bracelet in case of an emergency. This will tell any health care providers that you have a port.  Keep all follow-up visits as told by your health care provider. This is important. Contact a health care provider if:  You cannot flush your port with saline as directed, or you cannot draw blood from the port.  You have a fever or chills.  You have redness, swelling, or pain around your port insertion site.  You have fluid or blood coming from your port insertion site.  Your port insertion site feels warm to the touch.  You have pus or a bad smell coming from the port insertion site. Get help right away if:  You have chest pain or shortness of breath.  You have bleeding from your port that you cannot control. Summary  Take care of the port as told by your   health care provider. Keep the manufacturer's information card with you at all times.  Change your dressing as told by your health care provider.  Contact a health care provider if you have a fever or chills or if you have redness, swelling, or pain around your port insertion site.  Keep all follow-up visits as told by your health care provider. This information is not intended to replace advice given to you by your health care provider. Make sure you discuss any questions you have with your health care provider. Document Revised: 01/19/2018 Document Reviewed: 01/19/2018 Elsevier Patient Education   2021 [employer]  

## 2020-09-21 NOTE — Assessment & Plan Note (Signed)
She has slight elevated liver enzymes She is not symptomatic Observe closely for now

## 2020-09-21 NOTE — Assessment & Plan Note (Signed)
This is due to recent treatment It is not worse She is not very symptomatic Recent nutritional studies did not show any evidence of B12 or iron deficiency She does not need transfusion support today We will monitor carefully

## 2020-09-21 NOTE — Progress Notes (Signed)
[facility] OFFICE PROGRESS NOTE  Patient Care Team: Lilian Coma., MD as PCP - General (Internal Medicine)  ASSESSMENT & PLAN:  Ovarian cancer on right [facility]) She tolerated single agent carboplatin well without side effects She does not need transfusion support today I recommend minimum 3-4 cycles of treatment before repeat CT imaging  Anemia due to antineoplastic chemotherapy This is due to recent treatment It is not worse She is not very symptomatic Recent nutritional studies did not show any evidence of B12 or iron deficiency She does not need transfusion support today We will monitor carefully  Metastasis to liver [facility]) She has slight elevated liver enzymes She is not symptomatic Observe closely for now   No orders of the defined types were placed in this encounter.   All questions were answered. The patient knows to call the clinic with any problems, questions or concerns. The total time spent in the appointment was 20 minutes encounter with patients including review of chart and various tests results, discussions about plan of care and coordination of care plan   Heath Lark, MD 09/21/2020 9:37 AM  INTERVAL HISTORY: Please see below for problem oriented charting. She returns with her husband for further follow-up She tolerated recent chemo well No recent nausea No recent infection, fever or chills The patient denies any recent signs or symptoms of bleeding such as spontaneous epistaxis, hematuria or hematochezia.   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    03/20/2017 Imaging   GYN ultrasound was performed this revealed a small fundus measuring 5 cm there are 2 small fibroids within the fundus with the largest measuring 1.3 cm Within the endometrium there is a small amount a fluid the endometrial wall was slightly thickened at 4.8 mm the ultrasound was consistent with a small endometrial polyp  The right adnexa measured 7.4 x 6.7 cm contained a both a cystic and solid mass with a small amount of shadowing This is consistent with a dermoid tumor the left ovary was 2.6 x 1.5 x 1.0 cm There is no other adnexal masses no free fluid within the abdomen no ascites     04/10/2017 Surgery   Operation: Operative Laparscopy with  Peritoneal biopsy 2 hysteroscopy D&C Surgeon: Lynder Parents MD  Specimens: Peritoneal biopsy and cellular washings  Complications: None FIndings Small amount of ascites was found cellular washings were sent There was peritoneal studding And omental caking Even in steep Trendelenburg the ovaries and uterus were unable to be visualized due to the small intestines in the lower pelvis     04/10/2017 Pathology Results   Biopsy from [facility] was nondiagnostic.  Peritoneum biopsy did not reveal malignancy.  Endometrial biopsy also did not reveal malignancy.   04/13/2017 Tumor Marker   Patient's tumor was tested for the following markers: CA-125 Results of the tumor marker test revealed 324.1   04/20/2017 Tumor Marker   Patient's tumor was tested for the following markers: CA-125 Results of the tumor marker test revealed 345.6   04/22/2017 Pathology Results   Peritoneum,  biopsy - CARCINOMA. - SEE COMMENT.   04/22/2017 Procedure   CT-guided core biopsy performed of peritoneal tumor in the left lower quadrant.   04/24/2017 - 09/25/2017 Chemotherapy   She received carboplatin and Taxol x 3 cycles followed by interval surgical debulking on 12/31. Chemotherapy was  subsequently resumed for 3 more cycles   06/05/2017 Tumor Marker   Patient's tumor was tested for the following markers: CA-125 Results of the tumor marker test revealed 27.5   06/17/2017 Imaging   1. Considerable improvement in the prior omental caking, which now presents mainly as an indistinct stranding with slight nodularity along the left upper quadrant omentum. 2. Stable appearance of right ovarian mass with fatty and calcific elements classic for a dermoid. 3. There is accentuated enhancement in the walls of the common hepatic duct and common bile duct. A low-grade cholangitis is not readily excluded. 4. Other imaging findings of potential clinical significance: Mild cardiomegaly. Aortic Atherosclerosis (ICD10-I70.0). Lumbar scoliosis, spondylosis, degenerative disc disease, and pars defects at L5. These cause impingement at L5-S1 and lesser impingement at L3-4 and L4-5. 5. 65% compression fracture at L1 slightly worsened than on the prior exam, with associated vertebral sclerosis and posterior retropulsion.   07/06/2017 Surgery   Bilateral Salpingo-Oophorectomy W/Omentectomy, Total Abd Hysterectomy & Radical Dissection For Debulking Right - Ureterolysis, With Or Without Repositioning Of Ureter For Retroperitoneal Fibrosis   07/06/2017 Pathology Results   A: Omentum, omentectomy - Omentum with minimal residual carcinoma, consistent with high grade serous carcinoma (microscopic; stage ypT3a) - Extensive treatment effect with calcifications, fibrosis, histiocytes, and giant cell response - CRS score 3  B: Uterus with cervix and left ovary and fallopian tube, hysterectomy and left salpingo-oophorectomy - Cervix: Ectocervix and endocervix with no dysplasia or malignancy identified - Endometrium: Inactive with no hyperplasia, atypia, or malignancy identified - Myometrium: Adenomyosis and benign leiomyoma, size 1.2 cm - Serosa: Focal calcification and histiocytes suggestive of treatment  effect, with no viable carcinoma identified - Ovary, left: Physiologic changes and no carcinoma identified - Fallopian tube, left: 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 and no malignancy identified  C: Ovary and fallopian tube, right, salpingo-oophorectomy - Right fallopian tube with focal residual carcinoma, consistent with high grade serous carcinoma, and serous tubal intraepithelial carcinoma (STIC) - Associated calcifications and histiocytes suggestive of treatment response - Right ovary with calcifications and histiocytes suggestive of treatment effect, with no definite viable carcinoma identified - Mature cystic teratoma, 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 - See synoptic report and comment   08/14/2017 Tumor Marker   Patient's tumor was tested for the following markers: CA-125 Results of the tumor marker test revealed 14.6   09/04/2017 Tumor Marker   Patient's tumor was tested for the following markers: CA-125 Results of the tumor marker test revealed 12.5   10/03/2017 Genetic Testing   The Common Hereditary Cancer Panel offered by [facility] includes sequencing and/or deletion duplication testing of the following 47 genes: APC, ATM, AXIN2, BARD1, BMPR1A, BRCA1, BRCA2, BRIP1, CDH1, CDKN2A (p14ARF), CDKN2A (p16INK4a), CKD4, CHEK2, CTNNA1, DICER1, EPCAM (Deletion/duplication testing only), GREM1 (promoter region deletion/duplication testing only), KIT, MEN1, MLH1, MSH2, MSH3, MSH6, MUTYH, NBN, NF1, NHTL1, PALB2, PDGFRA, PMS2, POLD1, POLE, PTEN, RAD50, RAD51C, RAD51D, SDHB, SDHC, SDHD, SMAD4, SMARCA4. STK11, TP53, TSC1, TSC2, and VHL.  The following genes were evaluated for sequence changes only: SDHA and HOXB13 c.251G>A variant only.  Results: Negative, no pathogenic variants identified.  The date of this test report is 10/03/2017.    10/28/2017 Imaging   1.  Stable pulmonary nodules. No new or progressive findings. 2. No residual or recurrent omental disease is identified. No obvious peritoneal surface  disease. 3. Increasing soft tissue density and ill-defined interstitial changes around the abdominal wall surgical incision. This may be progressive postoperative changes or granulation tissue but attention on future scans to exclude the possibility of tumor. 4. Status post resection of right adnexal mass. No pelvic mass or pelvic adenopathy. 5. Stable T12 compression fracture.   01/25/2018 Tumor Marker   Patient's tumor was tested for the following markers: CA-125 Results of the tumor marker test revealed 9.2   12/10/2018 Tumor Marker   Patient's tumor was tested for the following markers: CA-125 Results of the tumor marker test revealed 212   12/22/2018 Imaging   1. Interval development of multiple soft tissue lesions in the gastrocolic ligament, mesentery, and along the peritoneal surface. These soft tissue lesions measure up to 6.5 cm in size. Associated interval development of moderate volume ascites in the abdomen and pelvis. Imaging features are consistent with metastatic disease. 2. Soft tissue projects through the rectus sheath in the right paraumbilical region, likely herniated small bowel as there is a large amount of decompressed small bowel does deep to this finding. However, metastatic deposit at this location is also possibility.  3. Trace pneumobilia in the left liver suggests prior sphincterotomy. 4.  Aortic Atherosclerois (ICD10-170.0)   12/30/2018 Procedure   Successful ultrasound-guided therapeutic paracentesis yielding 4.5 liters of peritoneal fluid.   12/30/2018 Procedure   Technically successful right IJ power-injectable port catheter placement. Ready for routine use.   12/31/2018 -  Chemotherapy   The patient had carboplatin and taxol for chemotherapy treatment.     01/21/2019 Tumor Marker   Patient's tumor was tested for the following markers: CA-125 Results of the tumor marker test revealed 408   02/11/2019 Tumor Marker   Patient's tumor was tested for the following  markers: CA-125 Results of the tumor marker test revealed 646.   03/02/2019 Imaging   1. Marked improvement in peritoneal carcinomatosis/ascites, without complete resolution. 2.  Aortic atherosclerosis (ICD10-170.0).     03/25/2019 Tumor Marker   Patient's tumor was tested for the following markers: CA-125 Results of the tumor marker test revealed 24.1.   04/15/2019 Tumor Marker   Patient's tumor was tested for the following markers: CA-125 Results of the tumor marker test revealed 15.3   05/18/2019 Imaging   1. There is progressive, nearly complete resolution of peritoneal and omental nodularity seen on prior examination with very faint residual peritoneal and omental haziness (series 2, image 50, 39).   2. Interval resolution of ascites.   3.  Status post hysterectomy and oophorectomy.   4.  Coronary artery disease.  Aortic Atherosclerosis (ICD10-I70.0).      Tumor Marker   Patient's tumor was tested for the following markers: CA-125 Results of the tumor marker test revealed 11.2.   06/10/2019 Tumor Marker   Patient's tumor was tested for the following markers: CA-125 Results of the tumor marker test revealed 9.1   07/29/2019 Tumor Marker   Patient's tumor was tested for the following markers: CA-125 Results of the tumor marker test revealed 8.8   08/18/2019 Imaging   1. No evidence of new or progressive metastatic disease in the abdomen or pelvis. Stable small nodular soft tissue focus at the right umbilicus. No new peritoneal implants. No ascites. 2. Chronic findings include: Aortic Atherosclerosis (ICD10-I70.0). Coronary atherosclerosis. Chronic moderate T12 vertebral compression fractures.   09/09/2019 Tumor Marker  Patient's tumor was tested for the following markers: CA-125 Results of the tumor marker test revealed 11.4.   10/21/2019 Tumor Marker   Patient's tumor was tested for the following markers: CA-125 Results of the tumor marker test revealed 21.5   10/31/2019  Imaging   1. New hepatic and peritoneal metastatic disease. 2.  Aortic atherosclerosis (ICD10-I70.0).     11/11/2019 -  Chemotherapy   The patient had carboplatin and gemzar for chemotherapy treatment.     11/11/2019 Tumor Marker   Patient's tumor was tested for the following markers: CA-125 Results of the tumor marker test revealed 40.2    Genetic Testing   Patient has genetic testing done for PD-L1. Results revealed patient has the following:  PD-L1 staining in tumor cells (TC): <1% PD-L1 staining in tumor-associated immune cells (IC): 0% PD-L1 combined positive score (CPS): <1%   11/11/2019 - 05/11/2020 Chemotherapy   The patient had carboplatin and gemzar for chemotherapy treatment    12/16/2019 Tumor Marker   Patient's tumor was tested for the following markers: CA-125 Results of the tumor marker test revealed 41.7   01/13/2020 Tumor Marker   Patient's tumor was tested for the following markers: CA-125 Results of the tumor marker test revealed 18.9   02/09/2020 Imaging   1. Positive response to therapy. 2. Hypodense lesions in liver are smaller or no longer measurable. 3. No clear residual peritoneal metastasis. 4. No ascites. 5. Persistent enhancement of the common bile duct suggest biliary inflammation.     02/10/2020 Tumor Marker   Patient's tumor was tested for the following markers: CA-125 Results of the tumor marker test revealed 13.2.   03/19/2020 Tumor Marker   Patient's tumor was tested for the following markers: CA-125 Results of the tumor marker test revealed 15.5   04/13/2020 Tumor Marker   Patient's tumor was tested for the following markers: CA-`125 Results of the tumor marker test revealed 17.9.   05/11/2020 Tumor Marker   Patient's tumor was tested for the following markers: CA-125 Results of the tumor marker test revealed 20.1.   05/24/2020 Imaging   1. Interval progression of soft tissue nodularity in the cul-de-sac, along the vaginal cuff. 2. Interval  progression of inferior right liver lesion with new tiny capsular soft tissue lesions along the left liver and inferior right liver. Imaging features are highly concerning for metastatic disease. 3. Small volume ascites. 4. Similar appearance of focal enhancement in the region of the umbilicus. 5. Aortic Atherosclerosis (ICD10-I70.0).   06/01/2020 -  Chemotherapy   The patient had taxol for chemotherapy treatment.     06/08/2020 Tumor Marker   Patient's tumor was tested for the following markers: CA-125 Results of the tumor marker test revealed 26   07/13/2020 Tumor Marker   Patient's tumor was tested for the following markers: CA-125. Results of the tumor marker test revealed 17.3   08/17/2020 Tumor Marker   Patient's tumor was tested for the following markers: CA-125. Results of the tumor marker test revealed 16.7   09/06/2020 Imaging   Status post hysterectomy and bilateral salpingo-oophorectomy.   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   Progressive hepatic metastases.   No evidence of metastatic disease in the chest.   09/12/2020 -  Chemotherapy    Patient is on Treatment Plan: OVARIAN CARBOPLATIN AUC 5 Q21D        REVIEW OF SYSTEMS:   Constitutional: Denies fevers, chills or abnormal weight loss Eyes: Denies blurriness of vision Ears, nose, mouth, throat,  and face: Denies mucositis or sore throat Respiratory: Denies cough, dyspnea or wheezes Cardiovascular: Denies palpitation, chest discomfort or lower extremity swelling Gastrointestinal:  Denies nausea, heartburn or change in bowel habits Skin: Denies abnormal skin rashes Lymphatics: Denies new lymphadenopathy or easy bruising Neurological:Denies numbness, tingling or new weaknesses Behavioral/Psych: Mood is stable, no new changes  All other systems were reviewed with the patient and are negative.  I have reviewed the past medical history, past surgical history, social history and family  history with the patient and they are unchanged from previous note.  ALLERGIES:  is allergic to aspirin, nsaids, and tramadol.  MEDICATIONS:  Current Outpatient Medications  Medication Sig Dispense Refill  . acetaminophen (TYLENOL) 650 MG CR tablet Take 1,300 mg by mouth every 8 (eight) hours.     Marland Kitchen atorvastatin (LIPITOR) 80 MG tablet Take 40 mg by mouth at bedtime.     . Cholecalciferol (VITAMIN D3) 2000 units TABS Take 2,000 Units by mouth daily.    . citalopram (CELEXA) 10 MG tablet Take 10 mg by mouth at bedtime    . ferrous sulfate 325 (65 FE) MG tablet Take 325 mg by mouth daily. 3 hours after meal and 30 min before a meal    . gabapentin (NEURONTIN) 300 MG capsule Take by mouth.    . Glucosamine Sulfate 500 MG TABS Take 500 mg by mouth 2 (two) times daily.     Marland Kitchen lidocaine-prilocaine (EMLA) cream Apply to affected area once 30 g 3  . loratadine (CLARITIN) 10 MG tablet Take 10 mg by mouth at bedtime.     Marland Kitchen losartan (COZAAR) 25 MG tablet Take 25 mg by mouth daily.     . meclizine (ANTIVERT) 25 MG tablet Take 25 mg by mouth 2 (two) times daily as needed for dizziness.     . metFORMIN (GLUCOPHAGE) 1000 MG tablet Take 1,000 mg by mouth 2 (two) times daily with a meal.     . omeprazole (PRILOSEC) 40 MG capsule Take 40 mg by mouth daily.     . ondansetron (ZOFRAN) 8 MG tablet Take 1 tablet (8 mg total) by mouth every 8 (eight) hours as needed for refractory nausea / vomiting. 30 tablet 1  . prochlorperazine (COMPAZINE) 10 MG tablet Take 1 tablet (10 mg total) by mouth every 6 (six) hours as needed (Nausea or vomiting). 60 tablet 1  . Pyridoxine HCl (VITAMIN B-6 PO) Take 50 mg by mouth 1 day or 1 dose.    . vitamin B-12 (CYANOCOBALAMIN) 1000 MCG tablet Take 1,000 mcg by mouth daily.      No current facility-administered medications for this visit.   Facility-Administered Medications Ordered in Other Visits  Medication Dose Route Frequency Provider Last Rate Last Admin  . sodium chloride  flush (NS) 0.9 % injection 10 mL  10 mL Intracatheter Once [name], Aryel Edelen, MD        PHYSICAL EXAMINATION: ECOG PERFORMANCE STATUS: 1 - Symptomatic but completely ambulatory  Vitals:   09/21/20 0810  BP: 118/61  Pulse: 86  Resp: 18  Temp: 98.7 F (37.1 C)  SpO2: 97%   Filed Weights   09/21/20 0810  Weight: 185 lb 3.2 oz (84 kg)    GENERAL:alert, no distress and comfortable.  She looks pale NEURO: alert & oriented x 3 with fluent speech, no focal motor/sensory deficits  LABORATORY DATA:  I have reviewed the data as listed    Component Value Date/Time   NA 136 09/21/2020 0751   NA 139  06/05/2017 0746   K 4.2 09/21/2020 0751   K 4.1 06/05/2017 0746   CL 103 09/21/2020 0751   CO2 25 09/21/2020 0751   CO2 21 (L) 06/05/2017 0746   GLUCOSE 137 (H) 09/21/2020 0751   GLUCOSE 279 (H) 06/05/2017 0746   BUN 20 09/21/2020 0751   BUN 15.0 06/05/2017 0746   CREATININE 0.77 09/21/2020 0751   CREATININE 0.9 06/05/2017 0746   CALCIUM 8.5 (L) 09/21/2020 0751   CALCIUM 9.1 01/13/2020 0735   CALCIUM 10.1 06/05/2017 0746   PROT 7.2 09/21/2020 0751   PROT 8.1 06/05/2017 0746   ALBUMIN 3.0 (L) 09/21/2020 0751   ALBUMIN 4.0 06/05/2017 0746   AST 68 (H) 09/21/2020 0751   AST 22 06/05/2017 0746   ALT 87 (H) 09/21/2020 0751   ALT 36 06/05/2017 0746   ALKPHOS 363 (H) 09/21/2020 0751   ALKPHOS 98 06/05/2017 0746   BILITOT 0.4 09/21/2020 0751   BILITOT 0.44 06/05/2017 0746   GFRNONAA >60 09/21/2020 0751   GFRAA >60 03/30/2020 0734    No results found for: SPEP, UPEP  Lab Results  Component Value Date   WBC 6.4 09/21/2020   NEUTROABS 4.8 09/21/2020   HGB 8.2 (L) 09/21/2020   HCT 26.9 (L) 09/21/2020   MCV 101.1 (H) 09/21/2020   PLT 302 09/21/2020      Chemistry      Component Value Date/Time   NA 136 09/21/2020 0751   NA 139 06/05/2017 0746   K 4.2 09/21/2020 0751   K 4.1 06/05/2017 0746   CL 103 09/21/2020 0751   CO2 25 09/21/2020 0751   CO2 21 (L) 06/05/2017 0746   BUN  20 09/21/2020 0751   BUN 15.0 06/05/2017 0746   CREATININE 0.77 09/21/2020 0751   CREATININE 0.9 06/05/2017 0746      Component Value Date/Time   CALCIUM 8.5 (L) 09/21/2020 0751   CALCIUM 9.1 01/13/2020 0735   CALCIUM 10.1 06/05/2017 0746   ALKPHOS 363 (H) 09/21/2020 0751   ALKPHOS 98 06/05/2017 0746   AST 68 (H) 09/21/2020 0751   AST 22 06/05/2017 0746   ALT 87 (H) 09/21/2020 0751   ALT 36 06/05/2017 0746   BILITOT 0.4 09/21/2020 0751   BILITOT 0.44 06/05/2017 0746

## 2020-09-21 NOTE — Assessment & Plan Note (Signed)
She tolerated single agent carboplatin well without side effects She does not need transfusion support today I recommend minimum 3-4 cycles of treatment before repeat CT imaging

## 2020-09-25 ENCOUNTER — Telehealth: Payer: Self-pay | Admitting: Hematology and Oncology

## 2020-09-25 NOTE — Telephone Encounter (Signed)
Scheduled appts per 3/17 sch msg. Pt aware.

## 2020-10-08 ENCOUNTER — Inpatient Hospital Stay: Payer: Medicare Other

## 2020-10-08 ENCOUNTER — Other Ambulatory Visit: Payer: Self-pay

## 2020-10-08 ENCOUNTER — Ambulatory Visit: Payer: Medicare Other

## 2020-10-08 ENCOUNTER — Inpatient Hospital Stay: Payer: Medicare Other | Attending: Gynecology

## 2020-10-08 ENCOUNTER — Encounter: Payer: Self-pay | Admitting: Hematology and Oncology

## 2020-10-08 ENCOUNTER — Other Ambulatory Visit (HOSPITAL_COMMUNITY): Payer: Self-pay

## 2020-10-08 ENCOUNTER — Inpatient Hospital Stay (HOSPITAL_BASED_OUTPATIENT_CLINIC_OR_DEPARTMENT_OTHER): Payer: Medicare Other | Admitting: Hematology and Oncology

## 2020-10-08 VITALS — BP 138/74 | HR 75 | Temp 98.8°F | Resp 16

## 2020-10-08 VITALS — BP 125/54 | HR 98 | Temp 98.5°F | Resp 18 | Ht 67.0 in | Wt 181.4 lb

## 2020-10-08 DIAGNOSIS — T451X5A Adverse effect of antineoplastic and immunosuppressive drugs, initial encounter: Secondary | ICD-10-CM

## 2020-10-08 DIAGNOSIS — Z5111 Encounter for antineoplastic chemotherapy: Secondary | ICD-10-CM | POA: Diagnosis not present

## 2020-10-08 DIAGNOSIS — M5442 Lumbago with sciatica, left side: Secondary | ICD-10-CM

## 2020-10-08 DIAGNOSIS — C561 Malignant neoplasm of right ovary: Secondary | ICD-10-CM | POA: Diagnosis present

## 2020-10-08 DIAGNOSIS — R748 Abnormal levels of other serum enzymes: Secondary | ICD-10-CM | POA: Insufficient documentation

## 2020-10-08 DIAGNOSIS — Z7189 Other specified counseling: Secondary | ICD-10-CM

## 2020-10-08 DIAGNOSIS — R634 Abnormal weight loss: Secondary | ICD-10-CM | POA: Insufficient documentation

## 2020-10-08 DIAGNOSIS — D6481 Anemia due to antineoplastic chemotherapy: Secondary | ICD-10-CM

## 2020-10-08 DIAGNOSIS — S22080A Wedge compression fracture of T11-T12 vertebra, initial encounter for closed fracture: Secondary | ICD-10-CM

## 2020-10-08 DIAGNOSIS — M549 Dorsalgia, unspecified: Secondary | ICD-10-CM | POA: Insufficient documentation

## 2020-10-08 DIAGNOSIS — D539 Nutritional anemia, unspecified: Secondary | ICD-10-CM

## 2020-10-08 LAB — CMP (CANCER CENTER ONLY)
ALT: 67 U/L — ABNORMAL HIGH (ref 0–44)
AST: 66 U/L — ABNORMAL HIGH (ref 15–41)
Albumin: 2.7 g/dL — ABNORMAL LOW (ref 3.5–5.0)
Alkaline Phosphatase: 438 U/L — ABNORMAL HIGH (ref 38–126)
Anion gap: 12 (ref 5–15)
BUN: 24 mg/dL — ABNORMAL HIGH (ref 8–23)
CO2: 25 mmol/L (ref 22–32)
Calcium: 9.1 mg/dL (ref 8.9–10.3)
Chloride: 103 mmol/L (ref 98–111)
Creatinine: 0.74 mg/dL (ref 0.44–1.00)
GFR, Estimated: >60 mL/min (ref >60)
Glucose, Bld: 132 mg/dL — ABNORMAL HIGH (ref 70–99)
Potassium: 4.7 mmol/L (ref 3.5–5.1)
Sodium: 140 mmol/L (ref 135–145)
Total Bilirubin: 0.5 mg/dL (ref 0.3–1.2)
Total Protein: 7.6 g/dL (ref 6.5–8.1)

## 2020-10-08 LAB — CBC WITH DIFFERENTIAL (CANCER CENTER ONLY)
Abs Immature Granulocytes: 0.02 10*3/uL (ref 0.00–0.07)
Basophils Absolute: 0 10*3/uL (ref 0.0–0.1)
Basophils Relative: 1 %
Eosinophils Absolute: 0.1 10*3/uL (ref 0.0–0.5)
Eosinophils Relative: 2 %
HCT: 26 % — ABNORMAL LOW (ref 36.0–46.0)
Hemoglobin: 7.9 g/dL — ABNORMAL LOW (ref 12.0–15.0)
Immature Granulocytes: 0 %
Lymphocytes Relative: 16 %
Lymphs Abs: 1 10*3/uL (ref 0.7–4.0)
MCH: 30.7 pg (ref 26.0–34.0)
MCHC: 30.4 g/dL (ref 30.0–36.0)
MCV: 101.2 fL — ABNORMAL HIGH (ref 80.0–100.0)
Monocytes Absolute: 0.4 10*3/uL (ref 0.1–1.0)
Monocytes Relative: 7 %
Neutro Abs: 4.3 10*3/uL (ref 1.7–7.7)
Neutrophils Relative %: 74 %
Platelet Count: 194 10*3/uL (ref 150–400)
RBC: 2.57 MIL/uL — ABNORMAL LOW (ref 3.87–5.11)
RDW: 17.7 % — ABNORMAL HIGH (ref 11.5–15.5)
WBC Count: 5.9 10*3/uL (ref 4.0–10.5)
nRBC: 0 % (ref 0.0–0.2)

## 2020-10-08 LAB — PREPARE RBC (CROSSMATCH)

## 2020-10-08 LAB — SAMPLE TO BLOOD BANK

## 2020-10-08 MED ORDER — SODIUM CHLORIDE 0.9 % IV SOLN
380.0000 mg | Freq: Once | INTRAVENOUS | Status: AC
  Administered 2020-10-08: 380 mg via INTRAVENOUS
  Filled 2020-10-08: qty 38

## 2020-10-08 MED ORDER — FAMOTIDINE IN NACL 20-0.9 MG/50ML-% IV SOLN
INTRAVENOUS | Status: AC
  Filled 2020-10-08: qty 50

## 2020-10-08 MED ORDER — FAMOTIDINE IN NACL 20-0.9 MG/50ML-% IV SOLN
20.0000 mg | Freq: Once | INTRAVENOUS | Status: AC
  Administered 2020-10-08: 20 mg via INTRAVENOUS

## 2020-10-08 MED ORDER — SODIUM CHLORIDE 0.9 % IV SOLN
150.0000 mg | Freq: Once | INTRAVENOUS | Status: AC
  Administered 2020-10-08: 150 mg via INTRAVENOUS
  Filled 2020-10-08: qty 150

## 2020-10-08 MED ORDER — DIPHENHYDRAMINE HCL 50 MG/ML IJ SOLN
25.0000 mg | Freq: Once | INTRAMUSCULAR | Status: AC
  Administered 2020-10-08: 25 mg via INTRAVENOUS

## 2020-10-08 MED ORDER — PALONOSETRON HCL INJECTION 0.25 MG/5ML
0.2500 mg | Freq: Once | INTRAVENOUS | Status: AC
  Administered 2020-10-08: 0.25 mg via INTRAVENOUS

## 2020-10-08 MED ORDER — PALONOSETRON HCL INJECTION 0.25 MG/5ML
INTRAVENOUS | Status: AC
  Filled 2020-10-08: qty 5

## 2020-10-08 MED ORDER — HEPARIN SOD (PORK) LOCK FLUSH 100 UNIT/ML IV SOLN
500.0000 [IU] | Freq: Once | INTRAVENOUS | Status: AC | PRN
  Administered 2020-10-08: 500 [IU]
  Filled 2020-10-08: qty 5

## 2020-10-08 MED ORDER — ACETAMINOPHEN 325 MG PO TABS
650.0000 mg | ORAL_TABLET | Freq: Once | ORAL | Status: AC
  Administered 2020-10-08: 650 mg via ORAL

## 2020-10-08 MED ORDER — SODIUM CHLORIDE 0.9% FLUSH
10.0000 mL | Freq: Once | INTRAVENOUS | Status: AC
  Administered 2020-10-08: 10 mL
  Filled 2020-10-08: qty 10

## 2020-10-08 MED ORDER — DEXAMETHASONE SODIUM PHOSPHATE 100 MG/10ML IJ SOLN
10.0000 mg | Freq: Once | INTRAMUSCULAR | Status: AC
  Administered 2020-10-08: 10 mg via INTRAVENOUS
  Filled 2020-10-08: qty 10

## 2020-10-08 MED ORDER — DIPHENHYDRAMINE HCL 50 MG/ML IJ SOLN
INTRAMUSCULAR | Status: AC
  Filled 2020-10-08: qty 1

## 2020-10-08 MED ORDER — OXYCODONE HCL 5 MG PO TABS
5.0000 mg | ORAL_TABLET | ORAL | 0 refills | Status: DC | PRN
  Filled 2020-10-08: qty 30, 5d supply, fill #0

## 2020-10-08 MED ORDER — SODIUM CHLORIDE 0.9 % IV SOLN
Freq: Once | INTRAVENOUS | Status: AC
  Filled 2020-10-08: qty 250

## 2020-10-08 MED ORDER — SODIUM CHLORIDE 0.9% IV SOLUTION
250.0000 mL | Freq: Once | INTRAVENOUS | Status: AC
  Administered 2020-10-08: 250 mL via INTRAVENOUS
  Filled 2020-10-08: qty 250

## 2020-10-08 MED ORDER — SODIUM CHLORIDE 0.9% FLUSH
10.0000 mL | INTRAVENOUS | Status: DC | PRN
  Administered 2020-10-08: 10 mL
  Filled 2020-10-08: qty 10

## 2020-10-08 NOTE — Patient Instructions (Signed)
Carboplatin injection What is this medicine? CARBOPLATIN (KAR boe pla tin) is a chemotherapy drug. It targets fast dividing cells, like cancer cells, and causes these cells to die. This medicine is used to treat ovarian cancer and many other cancers. This medicine may be used for other purposes; ask your health care provider or pharmacist if you have questions. COMMON BRAND NAME(S): Paraplatin What should I tell my health care provider before I take this medicine? They need to know if you have any of these conditions:  blood disorders  hearing problems  kidney disease  recent or ongoing radiation therapy  an unusual or allergic reaction to carboplatin, cisplatin, other chemotherapy, other medicines, foods, dyes, or preservatives  pregnant or trying to get pregnant  breast-feeding How should I use this medicine? This drug is usually given as an infusion into a vein. It is administered in a hospital or clinic by a specially trained health care professional. Talk to your pediatrician regarding the use of this medicine in children. Special care may be needed. Overdosage: If you think you have taken too much of this medicine contact a poison control center or emergency room at once. NOTE: This medicine is only for you. Do not share this medicine with others. What if I miss a dose? It is important not to miss a dose. Call your doctor or health care professional if you are unable to keep an appointment. What may interact with this medicine?  medicines for seizures  medicines to increase blood counts like filgrastim, pegfilgrastim, sargramostim  some antibiotics like amikacin, gentamicin, neomycin, streptomycin, tobramycin  vaccines Talk to your doctor or health care professional before taking any of these medicines:  acetaminophen  aspirin  ibuprofen  ketoprofen  naproxen This list may not describe all possible interactions. Give your health care provider a list of all the  medicines, herbs, non-prescription drugs, or dietary supplements you use. Also tell them if you smoke, drink alcohol, or use illegal drugs. Some items may interact with your medicine. What should I watch for while using this medicine? Your condition will be monitored carefully while you are receiving this medicine. You will need important blood work done while you are taking this medicine. This drug may make you feel generally unwell. This is not uncommon, as chemotherapy can affect healthy cells as well as cancer cells. Report any side effects. Continue your course of treatment even though you feel ill unless your doctor tells you to stop. In some cases, you may be given additional medicines to help with side effects. Follow all directions for their use. Call your doctor or health care professional for advice if you get a fever, chills or sore throat, or other symptoms of a cold or flu. Do not treat yourself. This drug decreases your body's ability to fight infections. Try to avoid being around people who are sick. This medicine may increase your risk to bruise or bleed. Call your doctor or health care professional if you notice any unusual bleeding. Be careful brushing and flossing your teeth or using a toothpick because you may get an infection or bleed more easily. If you have any dental work done, tell your dentist you are receiving this medicine. Avoid taking products that contain aspirin, acetaminophen, ibuprofen, naproxen, or ketoprofen unless instructed by your doctor. These medicines may hide a fever. Do not become pregnant while taking this medicine. Women should inform their doctor if they wish to become pregnant or think they might be pregnant. There is a  potential for serious side effects to an unborn child. Talk to your health care professional or pharmacist for more information. Do not breast-feed an infant while taking this medicine. What side effects may I notice from receiving this  medicine? Side effects that you should report to your doctor or health care professional as soon as possible:  allergic reactions like skin rash, itching or hives, swelling of the face, lips, or tongue  signs of infection - fever or chills, cough, sore throat, pain or difficulty passing urine  signs of decreased platelets or bleeding - bruising, pinpoint red spots on the skin, black, tarry stools, nosebleeds  signs of decreased red blood cells - unusually weak or tired, fainting spells, lightheadedness  breathing problems  changes in hearing  changes in vision  chest pain  high blood pressure  low blood counts - This drug may decrease the number of white blood cells, red blood cells and platelets. You may be at increased risk for infections and bleeding.  nausea and vomiting  pain, swelling, redness or irritation at the injection site  pain, tingling, numbness in the hands or feet  problems with balance, talking, walking  trouble passing urine or change in the amount of urine Side effects that usually do not require medical attention (report to your doctor or health care professional if they continue or are bothersome):  hair loss  loss of appetite  metallic taste in the mouth or changes in taste This list may not describe all possible side effects. Call your doctor for medical advice about side effects. You may report side effects to FDA at 1-800-FDA-1088. Where should I keep my medicine? This drug is given in a hospital or clinic and will not be stored at home. NOTE: This sheet is a summary. It may not cover all possible information. If you have questions about this medicine, talk to your doctor, pharmacist, or health care provider.  2021 Elsevier/Gold Standard (2007-09-28 14:38:05)   Blood Transfusion, Adult, Care After This sheet gives you information about how to care for yourself after your procedure. Your doctor may also give you more specific instructions. If  you have problems or questions, contact your doctor. What can I expect after the procedure? After the procedure, it is common to have:  Bruising and soreness at the IV site.  A fever or chills on the day of the procedure. This may be your body's response to the new blood cells received.  A headache. Follow these instructions at home: Insertion site care  Follow instructions from your doctor about how to take care of your insertion site. This is where an IV tube was put into your vein. Make sure you: ? Wash your hands with soap and water before and after you change your bandage (dressing). If you cannot use soap and water, use hand sanitizer. ? Change your bandage as told by your doctor.  Check your insertion site every day for signs of infection. Check for: ? Redness, swelling, or pain. ? Bleeding from the site. ? Warmth. ? Pus or a bad smell.      General instructions  Take over-the-counter and prescription medicines only as told by your doctor.  Rest as told by your doctor.  Go back to your normal activities as told by your doctor.  Keep all follow-up visits as told by your doctor. This is important. Contact a doctor if:  You have itching or red, swollen areas of skin (hives).  You feel worried or nervous (anxious).  You feel weak after doing your normal activities.  You have redness, swelling, warmth, or pain around the insertion site.  You have blood coming from the insertion site, and the blood does not stop with pressure.  You have pus or a bad smell coming from the insertion site. Get help right away if:  You have signs of a serious reaction. This may be coming from an allergy or the body's defense system (immune system). Signs include: ? Trouble breathing or shortness of breath. ? Swelling of the face or feeling warm (flushed). ? Fever or chills. ? Head, chest, or back pain. ? Dark pee (urine) or blood in the pee. ? Widespread rash. ? Fast  heartbeat. ? Feeling dizzy or light-headed. You may receive your blood transfusion in an outpatient setting. If so, you will be told whom to contact to report any reactions. These symptoms may be an emergency. Do not wait to see if the symptoms will go away. Get medical help right away. Call your local emergency services (911 in the [place] Do not drive yourself to the hospital. Summary  Bruising and soreness at the IV site are common.  Check your insertion site every day for signs of infection.  Rest as told by your doctor. Go back to your normal activities as told by your doctor.  Get help right away if you have signs of a serious reaction. This information is not intended to replace advice given to you by your health care provider. Make sure you discuss any questions you have with your health care provider. Document Revised: 12/16/2018 Document Reviewed: 12/16/2018 Elsevier Patient Education  [place].

## 2020-10-08 NOTE — Assessment & Plan Note (Signed)
The cause of her elevated liver enzymes are multifactorial The AST and ALT elevation is likely due to treatment The alkaline phosphatase could be related to treatment or bone marrow turnover given her severe anemia Observe closely for now We will proceed with treatment without delay

## 2020-10-08 NOTE — Assessment & Plan Note (Signed)
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

## 2020-10-08 NOTE — Progress Notes (Signed)
[place] OFFICE PROGRESS NOTE  Patient Care Team: Lilian Coma., MD as PCP - General (Internal Medicine)  ASSESSMENT & PLAN:  Ovarian cancer on right [facility]) She tolerated treatment poorly with progressive anemia, recent back pain and elevated liver enzymes It is too soon to document response to treatment I recommend minimum 3-4 cycles of therapy before repeat CT imaging I will start her on pain medication for pain control and will also arrange for blood transfusion support  Anemia due to antineoplastic chemotherapy The cause of anemia is multifactorial, likely secondary to side effects of chemotherapy Bone marrow involvement cannot be excluded and is worrisome given her elevated alkaline phosphatase Last cycle, I have checked vitamin B12 and iron studies and they were adequate She is symptomatic  We discussed some of the risks, benefits, and alternatives of blood transfusions. The patient is symptomatic from anemia and the hemoglobin level is critically low.  Some of the side-effects to be expected including risks of transfusion reactions, chills, infection, syndrome of volume overload and risk of hospitalization from various reasons and the patient is willing to proceed and went ahead to sign consent today.   Closed traumatic compression fracture of thoracic vertebra (HCC) She had recent flare of back pain, could be due to recent chemotherapy but bone marrow involvement cannot be excluded We discussed the importance of getting her pain under good control I recommend a trial of low-dose oxycodone We discussed narcotic refill policy and expected side effects from pain management  Elevated liver enzymes The cause of her elevated liver enzymes are multifactorial The AST and ALT elevation is likely due to treatment The alkaline phosphatase could be related to treatment or bone marrow turnover given her severe anemia Observe closely for now We will proceed with treatment  without delay  Weight loss She has unintentional weight loss I suspect this is due to her disease I will reduce the dose of chemotherapy accordingly   Orders Placed This Encounter  Procedures  . Informed Consent Details: Physician/Practitioner Attestation; Transcribe to consent form and obtain patient signature    Standing Status:   Future    Standing Expiration Date:   10/08/2021    Order Specific Question:   Physician/Practitioner attestation of informed consent for blood and or blood product transfusion    Answer:   I, the physician/practitioner, attest that I have discussed with the patient the benefits, risks, side effects, alternatives, likelihood of achieving goals and potential problems during recovery for the procedure that I have provided informed consent.    Order Specific Question:   Product(s)    Answer:   All Product(s)  . Care order/instruction    Transfuse Parameters    Standing Status:   Future    Standing Expiration Date:   10/08/2021  . Type and screen    Standing Status:   Future    Number of Occurrences:   1    Standing Expiration Date:   10/08/2021    All questions were answered. The patient knows to call the clinic with any problems, questions or concerns. The total time spent in the appointment was 40 minutes encounter with patients including review of chart and various tests results, discussions about plan of care and coordination of care plan   Heath Lark, MD 10/08/2020 10:21 AM  INTERVAL HISTORY: Please see below for problem oriented charting. She returns with her husband for cycle 2 of treatment She complained of weakness She also have recent flare of back pain  Acetaminophen is not helping She denies nausea or changes in bowel habits No abdominal pain The patient denies any recent signs or symptoms of bleeding such as spontaneous epistaxis, hematuria or hematochezia. 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    03/20/2017 Imaging   GYN ultrasound was performed this revealed a small fundus measuring 5 cm there are 2 small fibroids within the fundus with the largest measuring 1.3 cm Within the endometrium there is a small amount a fluid the endometrial wall was slightly thickened at 4.8 mm the ultrasound was consistent with a small endometrial polyp  The right adnexa measured 7.4 x 6.7 cm contained a both a cystic and solid mass with a small amount of shadowing This is consistent with a dermoid tumor the left ovary was 2.6 x 1.5 x 1.0 cm There is no other adnexal masses no free fluid within the abdomen no ascites     04/10/2017 Surgery   Operation: Operative Laparscopy with  Peritoneal biopsy 2 hysteroscopy D&C Surgeon: Lynder Parents MD  Specimens: Peritoneal biopsy and cellular washings  Complications: None FIndings Small amount of ascites was found cellular washings were sent There was peritoneal studding And omental caking Even in steep Trendelenburg the ovaries and uterus were unable to be visualized due to the small intestines in the lower pelvis     04/10/2017 Pathology Results   Biopsy from [facility] was nondiagnostic.  Peritoneum biopsy did not reveal malignancy.  Endometrial biopsy also did not reveal malignancy.   04/13/2017 Tumor Marker   Patient's tumor was tested for the following markers: CA-125 Results of the tumor marker test revealed 324.1   04/20/2017 Tumor Marker   Patient's tumor was tested for the following markers: CA-125 Results of the  tumor marker test revealed 345.6   04/22/2017 Pathology Results   Peritoneum, biopsy - CARCINOMA. - SEE COMMENT.   04/22/2017 Procedure   CT-guided core biopsy performed of peritoneal tumor in the left lower quadrant.   04/24/2017 - 09/25/2017 Chemotherapy   She received carboplatin and Taxol x 3 cycles followed by interval surgical debulking on 12/31. Chemotherapy was subsequently resumed for 3 more cycles   06/05/2017 Tumor Marker   Patient's tumor was tested for the following markers: CA-125 Results of the tumor marker test revealed 27.5   06/17/2017 Imaging   1. Considerable improvement in the prior omental caking, which now presents mainly as an indistinct stranding with slight nodularity along the left upper quadrant omentum. 2. Stable appearance of right ovarian mass with fatty and calcific elements classic for a dermoid. 3. There is accentuated enhancement in the walls of the common hepatic duct and common bile duct. A low-grade cholangitis is not readily excluded. 4. Other imaging findings of potential clinical significance: Mild cardiomegaly. Aortic Atherosclerosis (ICD10-I70.0). Lumbar scoliosis, spondylosis, degenerative disc disease, and pars defects at L5. These cause impingement at L5-S1 and lesser impingement at L3-4 and L4-5. 5. 65% compression fracture at L1 slightly worsened than on the prior exam, with associated vertebral sclerosis and posterior retropulsion.  07/06/2017 Surgery   Bilateral Salpingo-Oophorectomy W/Omentectomy, Total Abd Hysterectomy & Radical Dissection For Debulking Right - Ureterolysis, With Or Without Repositioning Of Ureter For Retroperitoneal Fibrosis   07/06/2017 Pathology Results   A: Omentum, omentectomy - Omentum with minimal residual carcinoma, consistent with high grade serous carcinoma (microscopic; stage ypT3a) - Extensive treatment effect with calcifications, fibrosis, histiocytes, and giant cell response - CRS score 3  B: Uterus  with cervix and left ovary and fallopian tube, hysterectomy and left salpingo-oophorectomy - Cervix: Ectocervix and endocervix with no dysplasia or malignancy identified - Endometrium: Inactive with no hyperplasia, atypia, or malignancy identified - Myometrium: Adenomyosis and benign leiomyoma, size 1.2 cm - Serosa: Focal calcification and histiocytes suggestive of treatment effect, with no viable carcinoma identified - Ovary, left: Physiologic changes and no carcinoma identified - Fallopian tube, left: 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 and no malignancy identified  C: Ovary and fallopian tube, right, salpingo-oophorectomy - Right fallopian tube with focal residual carcinoma, consistent with high grade serous carcinoma, and serous tubal intraepithelial carcinoma (STIC) - Associated calcifications and histiocytes suggestive of treatment response - Right ovary with calcifications and histiocytes suggestive of treatment effect, with no definite viable carcinoma identified - Mature cystic teratoma, 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 - See synoptic report and comment   08/14/2017 Tumor Marker   Patient's tumor was tested for the following markers: CA-125 Results of the tumor marker test revealed 14.6   09/04/2017 Tumor Marker   Patient's tumor was tested for the following markers: CA-125 Results of the tumor marker test revealed 12.5   10/03/2017 Genetic Testing   The Common Hereditary Cancer Panel offered by [facility] includes sequencing and/or deletion duplication testing of the following 47 genes: APC, ATM, AXIN2, BARD1, BMPR1A, BRCA1, BRCA2, BRIP1, CDH1, CDKN2A (p14ARF), CDKN2A (p16INK4a), CKD4, CHEK2, CTNNA1, DICER1, EPCAM (Deletion/duplication testing only), GREM1 (promoter region deletion/duplication testing only), KIT, MEN1, MLH1, MSH2, MSH3, MSH6, MUTYH, NBN, NF1, NHTL1, PALB2, PDGFRA, PMS2, POLD1, POLE, PTEN, RAD50, RAD51C, RAD51D, SDHB, SDHC, SDHD, SMAD4, SMARCA4. STK11, TP53, TSC1, TSC2, and VHL.  The  following genes were evaluated for sequence changes only: SDHA and HOXB13 c.251G>A variant only.  Results: Negative, no pathogenic variants identified.  The date of this test report is 10/03/2017.    10/28/2017 Imaging   1. Stable pulmonary nodules. No new or progressive findings. 2. No residual or recurrent omental disease is identified. No obvious peritoneal surface disease. 3. Increasing soft tissue density and ill-defined interstitial changes around the abdominal wall surgical incision. This may be progressive postoperative changes or granulation tissue but attention on future scans to exclude the possibility of tumor. 4. Status post resection of right adnexal mass. No pelvic mass or pelvic adenopathy. 5. Stable T12 compression fracture.   01/25/2018 Tumor Marker   Patient's tumor was tested for the following markers: CA-125 Results of the tumor marker test revealed 9.2   12/10/2018 Tumor Marker   Patient's tumor was tested for the following markers: CA-125 Results of the tumor marker test revealed 212   12/22/2018 Imaging   1. Interval development of multiple soft tissue lesions in the gastrocolic ligament, mesentery, and along the peritoneal surface. These soft tissue lesions measure up to 6.5 cm in size. Associated interval development of moderate volume ascites in the abdomen and pelvis. Imaging features are consistent with metastatic disease. 2. Soft tissue projects through the rectus sheath in the right paraumbilical region, likely herniated small bowel as there is a large amount of decompressed small bowel does deep to  this finding. However, metastatic deposit at this location is also possibility.  3. Trace pneumobilia in the left liver suggests prior sphincterotomy. 4.  Aortic Atherosclerois (ICD10-170.0)   12/30/2018 Procedure   Successful ultrasound-guided therapeutic paracentesis yielding 4.5 liters of peritoneal fluid.   12/30/2018 Procedure   Technically successful right IJ  power-injectable port catheter placement. Ready for routine use.   12/31/2018 -  Chemotherapy   The patient had carboplatin and taxol for chemotherapy treatment.     01/21/2019 Tumor Marker   Patient's tumor was tested for the following markers: CA-125 Results of the tumor marker test revealed 408   02/11/2019 Tumor Marker   Patient's tumor was tested for the following markers: CA-125 Results of the tumor marker test revealed 646.   03/02/2019 Imaging   1. Marked improvement in peritoneal carcinomatosis/ascites, without complete resolution. 2.  Aortic atherosclerosis (ICD10-170.0).     03/25/2019 Tumor Marker   Patient's tumor was tested for the following markers: CA-125 Results of the tumor marker test revealed 24.1.   04/15/2019 Tumor Marker   Patient's tumor was tested for the following markers: CA-125 Results of the tumor marker test revealed 15.3   05/18/2019 Imaging   1. There is progressive, nearly complete resolution of peritoneal and omental nodularity seen on prior examination with very faint residual peritoneal and omental haziness (series 2, image 50, 39).   2. Interval resolution of ascites.   3.  Status post hysterectomy and oophorectomy.   4.  Coronary artery disease.  Aortic Atherosclerosis (ICD10-I70.0).      Tumor Marker   Patient's tumor was tested for the following markers: CA-125 Results of the tumor marker test revealed 11.2.   06/10/2019 Tumor Marker   Patient's tumor was tested for the following markers: CA-125 Results of the tumor marker test revealed 9.1   07/29/2019 Tumor Marker   Patient's tumor was tested for the following markers: CA-125 Results of the tumor marker test revealed 8.8   08/18/2019 Imaging   1. No evidence of new or progressive metastatic disease in the abdomen or pelvis. Stable small nodular soft tissue focus at the right umbilicus. No new peritoneal implants. No ascites. 2. Chronic findings include: Aortic Atherosclerosis  (ICD10-I70.0). Coronary atherosclerosis. Chronic moderate T12 vertebral compression fractures.   09/09/2019 Tumor Marker   Patient's tumor was tested for the following markers: CA-125 Results of the tumor marker test revealed 11.4.   10/21/2019 Tumor Marker   Patient's tumor was tested for the following markers: CA-125 Results of the tumor marker test revealed 21.5   10/31/2019 Imaging   1. New hepatic and peritoneal metastatic disease. 2.  Aortic atherosclerosis (ICD10-I70.0).     11/11/2019 -  Chemotherapy   The patient had carboplatin and gemzar for chemotherapy treatment.     11/11/2019 Tumor Marker   Patient's tumor was tested for the following markers: CA-125 Results of the tumor marker test revealed 40.2    Genetic Testing   Patient has genetic testing done for PD-L1. Results revealed patient has the following:  PD-L1 staining in tumor cells (TC): <1% PD-L1 staining in tumor-associated immune cells (IC): 0% PD-L1 combined positive score (CPS): <1%   11/11/2019 - 05/11/2020 Chemotherapy   The patient had carboplatin and gemzar for chemotherapy treatment    12/16/2019 Tumor Marker   Patient's tumor was tested for the following markers: CA-125 Results of the tumor marker test revealed 41.7   01/13/2020 Tumor Marker   Patient's tumor was tested for the following markers: CA-125 Results of  the tumor marker test revealed 18.9   02/09/2020 Imaging   1. Positive response to therapy. 2. Hypodense lesions in liver are smaller or no longer measurable. 3. No clear residual peritoneal metastasis. 4. No ascites. 5. Persistent enhancement of the common bile duct suggest biliary inflammation.     02/10/2020 Tumor Marker   Patient's tumor was tested for the following markers: CA-125 Results of the tumor marker test revealed 13.2.   03/19/2020 Tumor Marker   Patient's tumor was tested for the following markers: CA-125 Results of the tumor marker test revealed 15.5   04/13/2020 Tumor  Marker   Patient's tumor was tested for the following markers: CA-`125 Results of the tumor marker test revealed 17.9.   05/11/2020 Tumor Marker   Patient's tumor was tested for the following markers: CA-125 Results of the tumor marker test revealed 20.1.   05/24/2020 Imaging   1. Interval progression of soft tissue nodularity in the cul-de-sac, along the vaginal cuff. 2. Interval progression of inferior right liver lesion with new tiny capsular soft tissue lesions along the left liver and inferior right liver. Imaging features are highly concerning for metastatic disease. 3. Small volume ascites. 4. Similar appearance of focal enhancement in the region of the umbilicus. 5. Aortic Atherosclerosis (ICD10-I70.0).   06/01/2020 -  Chemotherapy   The patient had taxol for chemotherapy treatment.     06/08/2020 Tumor Marker   Patient's tumor was tested for the following markers: CA-125 Results of the tumor marker test revealed 26   07/13/2020 Tumor Marker   Patient's tumor was tested for the following markers: CA-125. Results of the tumor marker test revealed 17.3   08/17/2020 Tumor Marker   Patient's tumor was tested for the following markers: CA-125. Results of the tumor marker test revealed 16.7   09/06/2020 Imaging   Status post hysterectomy and bilateral salpingo-oophorectomy.   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   Progressive hepatic metastases.   No evidence of metastatic disease in the chest.   09/12/2020 -  Chemotherapy    Patient is on Treatment Plan: OVARIAN CARBOPLATIN AUC 5 Q21D        REVIEW OF SYSTEMS:   Constitutional: Denies fevers, chills or abnormal weight loss Eyes: Denies blurriness of vision Ears, nose, mouth, throat, and face: Denies mucositis or sore throat Respiratory: Denies cough, dyspnea or wheezes Cardiovascular: Denies palpitation, chest discomfort or lower extremity swelling Gastrointestinal:  Denies nausea,  heartburn or change in bowel habits Skin: Denies abnormal skin rashes Lymphatics: Denies new lymphadenopathy or easy bruising Neurological:Denies numbness, tingling or new weaknesses Behavioral/Psych: Mood is stable, no new changes  All other systems were reviewed with the patient and are negative.  I have reviewed the past medical history, past surgical history, social history and family history with the patient and they are unchanged from previous note.  ALLERGIES:  is allergic to aspirin, nsaids, and tramadol.  MEDICATIONS:  Current Outpatient Medications  Medication Sig Dispense Refill  . oxyCODONE (OXY IR/ROXICODONE) 5 MG immediate release tablet Take 1 tablet (5 mg total) by mouth every 4 (four) hours as needed for severe pain. 30 tablet 0  . acetaminophen (TYLENOL) 650 MG CR tablet Take 1,300 mg by mouth every 8 (eight) hours.     Marland Kitchen atorvastatin (LIPITOR) 80 MG tablet Take 40 mg by mouth at bedtime.     . Cholecalciferol (VITAMIN D3) 2000 units TABS Take 2,000 Units by mouth daily.    . citalopram (CELEXA) 10 MG tablet  Take 10 mg by mouth at bedtime    . ferrous sulfate 325 (65 FE) MG tablet Take 325 mg by mouth daily. 3 hours after meal and 30 min before a meal    . gabapentin (NEURONTIN) 300 MG capsule Take by mouth.    . Glucosamine Sulfate 500 MG TABS Take 500 mg by mouth 2 (two) times daily.     Marland Kitchen lidocaine-prilocaine (EMLA) cream Apply to affected area once 30 g 3  . loratadine (CLARITIN) 10 MG tablet Take 10 mg by mouth at bedtime.     Marland Kitchen losartan (COZAAR) 25 MG tablet Take 25 mg by mouth daily.     . meclizine (ANTIVERT) 25 MG tablet Take 25 mg by mouth 2 (two) times daily as needed for dizziness.     . metFORMIN (GLUCOPHAGE) 1000 MG tablet Take 1,000 mg by mouth 2 (two) times daily with a meal.     . omeprazole (PRILOSEC) 40 MG capsule Take 40 mg by mouth daily.     . ondansetron (ZOFRAN) 8 MG tablet Take 1 tablet (8 mg total) by mouth every 8 (eight) hours as needed for  refractory nausea / vomiting. 30 tablet 1  . prochlorperazine (COMPAZINE) 10 MG tablet Take 1 tablet (10 mg total) by mouth every 6 (six) hours as needed (Nausea or vomiting). 60 tablet 1  . Pyridoxine HCl (VITAMIN B-6 PO) Take 50 mg by mouth 1 day or 1 dose.    . vitamin B-12 (CYANOCOBALAMIN) 1000 MCG tablet Take 1,000 mcg by mouth daily.      No current facility-administered medications for this visit.   Facility-Administered Medications Ordered in Other Visits  Medication Dose Route Frequency Provider Last Rate Last Admin  . CARBOplatin (PARAPLATIN) 380 mg in sodium chloride 0.9 % 250 mL chemo infusion  380 mg Intravenous Once [name], Kennan Detter, MD      . fosaprepitant (EMEND) 150 mg in sodium chloride 0.9 % 145 mL IVPB  150 mg Intravenous Once [name], Karalynn Cottone, MD 450 mL/hr at 10/08/20 1002 150 mg at 10/08/20 1002  . heparin lock flush 100 unit/mL  500 Units Intracatheter Once PRN [name], Carlesha Seiple, MD      . sodium chloride flush (NS) 0.9 % injection 10 mL  10 mL Intracatheter Once [name], Shannon Balthazar, MD      . sodium chloride flush (NS) 0.9 % injection 10 mL  10 mL Intracatheter PRN [name], Trenae Brunke, MD        PHYSICAL EXAMINATION: ECOG PERFORMANCE STATUS: 2 - Symptomatic, <50% confined to bed  Vitals:   10/08/20 0834  BP: (!) 125/54  Pulse: 98  Resp: 18  Temp: 98.5 F (36.9 C)  SpO2: 96%   Filed Weights   10/08/20 0834  Weight: 181 lb 6.4 oz (82.3 kg)    GENERAL:alert, no distress and comfortable.  She looks pale SKIN: skin color, texture, turgor are normal, no rashes or significant lesions EYES: normal, Conjunctiva are pink and non-injected, sclera clear OROPHARYNX:no exudate, no erythema and lips, buccal mucosa, and tongue normal  NECK: supple, thyroid normal size, non-tender, without nodularity LYMPH:  no palpable lymphadenopathy in the cervical, axillary or inguinal LUNGS: clear to auscultation and percussion with normal breathing effort HEART: regular rate & rhythm and no murmurs and no lower  extremity edema ABDOMEN:abdomen soft, non-tender and normal bowel sounds Musculoskeletal:no cyanosis of digits and no clubbing  NEURO: alert & oriented x 3 with fluent speech, no focal motor/sensory deficits  LABORATORY DATA:  I have reviewed the data  as listed    Component Value Date/Time   NA 140 10/08/2020 0815   NA 139 06/05/2017 0746   K 4.7 10/08/2020 0815   K 4.1 06/05/2017 0746   CL 103 10/08/2020 0815   CO2 25 10/08/2020 0815   CO2 21 (L) 06/05/2017 0746   GLUCOSE 132 (H) 10/08/2020 0815   GLUCOSE 279 (H) 06/05/2017 0746   BUN 24 (H) 10/08/2020 0815   BUN 15.0 06/05/2017 0746   CREATININE 0.74 10/08/2020 0815   CREATININE 0.9 06/05/2017 0746   CALCIUM 9.1 10/08/2020 0815   CALCIUM 9.1 01/13/2020 0735   CALCIUM 10.1 06/05/2017 0746   PROT 7.6 10/08/2020 0815   PROT 8.1 06/05/2017 0746   ALBUMIN 2.7 (L) 10/08/2020 0815   ALBUMIN 4.0 06/05/2017 0746   AST 66 (H) 10/08/2020 0815   AST 22 06/05/2017 0746   ALT 67 (H) 10/08/2020 0815   ALT 36 06/05/2017 0746   ALKPHOS 438 (H) 10/08/2020 0815   ALKPHOS 98 06/05/2017 0746   BILITOT 0.5 10/08/2020 0815   BILITOT 0.44 06/05/2017 0746   GFRNONAA >60 10/08/2020 0815   GFRAA >60 03/30/2020 0734    No results found for: SPEP, UPEP  Lab Results  Component Value Date   WBC 5.9 10/08/2020   NEUTROABS 4.3 10/08/2020   HGB 7.9 (L) 10/08/2020   HCT 26.0 (L) 10/08/2020   MCV 101.2 (H) 10/08/2020   PLT 194 10/08/2020      Chemistry      Component Value Date/Time   NA 140 10/08/2020 0815   NA 139 06/05/2017 0746   K 4.7 10/08/2020 0815   K 4.1 06/05/2017 0746   CL 103 10/08/2020 0815   CO2 25 10/08/2020 0815   CO2 21 (L) 06/05/2017 0746   BUN 24 (H) 10/08/2020 0815   BUN 15.0 06/05/2017 0746   CREATININE 0.74 10/08/2020 0815   CREATININE 0.9 06/05/2017 0746      Component Value Date/Time   CALCIUM 9.1 10/08/2020 0815   CALCIUM 9.1 01/13/2020 0735   CALCIUM 10.1 06/05/2017 0746   ALKPHOS 438 (H) 10/08/2020  0815   ALKPHOS 98 06/05/2017 0746   AST 66 (H) 10/08/2020 0815   AST 22 06/05/2017 0746   ALT 67 (H) 10/08/2020 0815   ALT 36 06/05/2017 0746   BILITOT 0.5 10/08/2020 0815   BILITOT 0.44 06/05/2017 0746

## 2020-10-08 NOTE — Assessment & Plan Note (Signed)
The cause of anemia is multifactorial, likely secondary to side effects of chemotherapy Bone marrow involvement cannot be excluded and is worrisome given her elevated alkaline phosphatase Last cycle, I have checked vitamin B12 and iron studies and they were adequate She is symptomatic  We discussed some of the risks, benefits, and alternatives of blood transfusions. The patient is symptomatic from anemia and the hemoglobin level is critically low.  Some of the side-effects to be expected including risks of transfusion reactions, chills, infection, syndrome of volume overload and risk of hospitalization from various reasons and the patient is willing to proceed and went ahead to sign consent today.

## 2020-10-08 NOTE — Assessment & Plan Note (Signed)
She had recent flare of back pain, could be due to recent chemotherapy but bone marrow involvement cannot be excluded We discussed the importance of getting her pain under good control I recommend a trial of low-dose oxycodone We discussed narcotic refill policy and expected side effects from pain management

## 2020-10-08 NOTE — Assessment & Plan Note (Signed)
She tolerated treatment poorly with progressive anemia, recent back pain and elevated liver enzymes It is too soon to document response to treatment I recommend minimum 3-4 cycles of therapy before repeat CT imaging I will start her on pain medication for pain control and will also arrange for blood transfusion support

## 2020-10-09 LAB — TYPE AND SCREEN
ABO/RH(D): O POS
Antibody Screen: NEGATIVE
Unit division: 0

## 2020-10-09 LAB — BPAM RBC
Blood Product Expiration Date: 202204152359
ISSUE DATE / TIME: 202204041159
Unit Type and Rh: 5100

## 2020-10-12 ENCOUNTER — Other Ambulatory Visit (HOSPITAL_COMMUNITY): Payer: Self-pay

## 2020-10-12 ENCOUNTER — Other Ambulatory Visit: Payer: Self-pay | Admitting: Hematology and Oncology

## 2020-10-12 MED ORDER — OXYCODONE HCL 5 MG PO TABS
5.0000 mg | ORAL_TABLET | ORAL | 0 refills | Status: DC | PRN
  Filled 2020-10-12: qty 60, 10d supply, fill #0

## 2020-10-29 ENCOUNTER — Inpatient Hospital Stay: Payer: Medicare Other

## 2020-10-29 ENCOUNTER — Other Ambulatory Visit: Payer: Self-pay | Admitting: Hematology and Oncology

## 2020-10-29 ENCOUNTER — Inpatient Hospital Stay (HOSPITAL_BASED_OUTPATIENT_CLINIC_OR_DEPARTMENT_OTHER): Payer: Medicare Other | Admitting: Hematology and Oncology

## 2020-10-29 ENCOUNTER — Other Ambulatory Visit: Payer: Self-pay

## 2020-10-29 ENCOUNTER — Encounter: Payer: Self-pay | Admitting: Hematology and Oncology

## 2020-10-29 ENCOUNTER — Other Ambulatory Visit (HOSPITAL_COMMUNITY): Payer: Self-pay

## 2020-10-29 VITALS — BP 114/58 | HR 79 | Temp 98.1°F | Resp 17

## 2020-10-29 DIAGNOSIS — Z5111 Encounter for antineoplastic chemotherapy: Secondary | ICD-10-CM | POA: Diagnosis not present

## 2020-10-29 DIAGNOSIS — S22080A Wedge compression fracture of T11-T12 vertebra, initial encounter for closed fracture: Secondary | ICD-10-CM | POA: Diagnosis not present

## 2020-10-29 DIAGNOSIS — Z7189 Other specified counseling: Secondary | ICD-10-CM

## 2020-10-29 DIAGNOSIS — C561 Malignant neoplasm of right ovary: Secondary | ICD-10-CM

## 2020-10-29 DIAGNOSIS — R748 Abnormal levels of other serum enzymes: Secondary | ICD-10-CM | POA: Diagnosis not present

## 2020-10-29 DIAGNOSIS — T451X5A Adverse effect of antineoplastic and immunosuppressive drugs, initial encounter: Secondary | ICD-10-CM

## 2020-10-29 DIAGNOSIS — D6481 Anemia due to antineoplastic chemotherapy: Secondary | ICD-10-CM

## 2020-10-29 LAB — CMP (CANCER CENTER ONLY)
ALT: 36 U/L (ref 0–44)
AST: 63 U/L — ABNORMAL HIGH (ref 15–41)
Albumin: 2.5 g/dL — ABNORMAL LOW (ref 3.5–5.0)
Alkaline Phosphatase: 412 U/L — ABNORMAL HIGH (ref 38–126)
Anion gap: 12 (ref 5–15)
BUN: 25 mg/dL — ABNORMAL HIGH (ref 8–23)
CO2: 25 mmol/L (ref 22–32)
Calcium: 8.9 mg/dL (ref 8.9–10.3)
Chloride: 102 mmol/L (ref 98–111)
Creatinine: 0.77 mg/dL (ref 0.44–1.00)
GFR, Estimated: >60 mL/min (ref >60)
Glucose, Bld: 124 mg/dL — ABNORMAL HIGH (ref 70–99)
Potassium: 4.3 mmol/L (ref 3.5–5.1)
Sodium: 139 mmol/L (ref 135–145)
Total Bilirubin: 0.4 mg/dL (ref 0.3–1.2)
Total Protein: 8.1 g/dL (ref 6.5–8.1)

## 2020-10-29 LAB — CBC WITH DIFFERENTIAL (CANCER CENTER ONLY)
Abs Immature Granulocytes: 0.02 10*3/uL (ref 0.00–0.07)
Basophils Absolute: 0 10*3/uL (ref 0.0–0.1)
Basophils Relative: 1 %
Eosinophils Absolute: 0.1 10*3/uL (ref 0.0–0.5)
Eosinophils Relative: 1 %
HCT: 24 % — ABNORMAL LOW (ref 36.0–46.0)
Hemoglobin: 7.3 g/dL — ABNORMAL LOW (ref 12.0–15.0)
Immature Granulocytes: 1 %
Lymphocytes Relative: 21 %
Lymphs Abs: 0.9 10*3/uL (ref 0.7–4.0)
MCH: 30.9 pg (ref 26.0–34.0)
MCHC: 30.4 g/dL (ref 30.0–36.0)
MCV: 101.7 fL — ABNORMAL HIGH (ref 80.0–100.0)
Monocytes Absolute: 0.5 10*3/uL (ref 0.1–1.0)
Monocytes Relative: 12 %
Neutro Abs: 2.7 10*3/uL (ref 1.7–7.7)
Neutrophils Relative %: 64 %
Platelet Count: 185 10*3/uL (ref 150–400)
RBC: 2.36 MIL/uL — ABNORMAL LOW (ref 3.87–5.11)
RDW: 18.8 % — ABNORMAL HIGH (ref 11.5–15.5)
WBC Count: 4.2 10*3/uL (ref 4.0–10.5)
nRBC: 0 % (ref 0.0–0.2)

## 2020-10-29 LAB — PREPARE RBC (CROSSMATCH)

## 2020-10-29 MED ORDER — SODIUM CHLORIDE 0.9 % IV SOLN
150.0000 mg | Freq: Once | INTRAVENOUS | Status: AC
  Administered 2020-10-29: 150 mg via INTRAVENOUS
  Filled 2020-10-29: qty 150

## 2020-10-29 MED ORDER — ACETAMINOPHEN 325 MG PO TABS
ORAL_TABLET | ORAL | Status: AC
  Filled 2020-10-29: qty 2

## 2020-10-29 MED ORDER — DIPHENHYDRAMINE HCL 50 MG/ML IJ SOLN
25.0000 mg | Freq: Once | INTRAMUSCULAR | Status: AC
  Administered 2020-10-29: 25 mg via INTRAVENOUS

## 2020-10-29 MED ORDER — OXYCODONE HCL 5 MG PO TABS
5.0000 mg | ORAL_TABLET | ORAL | 0 refills | Status: DC | PRN
  Filled 2020-10-29: qty 60, 10d supply, fill #0

## 2020-10-29 MED ORDER — SODIUM CHLORIDE 0.9% FLUSH
10.0000 mL | Freq: Once | INTRAVENOUS | Status: AC
  Administered 2020-10-29: 10 mL
  Filled 2020-10-29: qty 10

## 2020-10-29 MED ORDER — PALONOSETRON HCL INJECTION 0.25 MG/5ML
0.2500 mg | Freq: Once | INTRAVENOUS | Status: AC
  Administered 2020-10-29: 0.25 mg via INTRAVENOUS

## 2020-10-29 MED ORDER — DIPHENHYDRAMINE HCL 50 MG/ML IJ SOLN
INTRAMUSCULAR | Status: AC
  Filled 2020-10-29: qty 1

## 2020-10-29 MED ORDER — SODIUM CHLORIDE 0.9% IV SOLUTION
250.0000 mL | Freq: Once | INTRAVENOUS | Status: AC
Start: 2020-10-29 — End: 2020-10-29
  Administered 2020-10-29: 250 mL via INTRAVENOUS
  Filled 2020-10-29: qty 250

## 2020-10-29 MED ORDER — SODIUM CHLORIDE 0.9% FLUSH
10.0000 mL | INTRAVENOUS | Status: DC | PRN
  Administered 2020-10-29: 10 mL
  Filled 2020-10-29: qty 10

## 2020-10-29 MED ORDER — HEPARIN SOD (PORK) LOCK FLUSH 100 UNIT/ML IV SOLN
500.0000 [IU] | Freq: Once | INTRAVENOUS | Status: AC | PRN
  Administered 2020-10-29: 500 [IU]
  Filled 2020-10-29: qty 5

## 2020-10-29 MED ORDER — ACETAMINOPHEN 325 MG PO TABS
650.0000 mg | ORAL_TABLET | Freq: Once | ORAL | Status: AC
  Administered 2020-10-29: 650 mg via ORAL

## 2020-10-29 MED ORDER — SODIUM CHLORIDE 0.9 % IV SOLN
380.0000 mg | Freq: Once | INTRAVENOUS | Status: AC
  Administered 2020-10-29: 380 mg via INTRAVENOUS
  Filled 2020-10-29: qty 38

## 2020-10-29 MED ORDER — PALONOSETRON HCL INJECTION 0.25 MG/5ML
INTRAVENOUS | Status: AC
  Filled 2020-10-29: qty 5

## 2020-10-29 MED ORDER — SODIUM CHLORIDE 0.9 % IV SOLN
Freq: Once | INTRAVENOUS | Status: AC
  Filled 2020-10-29: qty 250

## 2020-10-29 MED ORDER — SODIUM CHLORIDE 0.9 % IV SOLN
10.0000 mg | Freq: Once | INTRAVENOUS | Status: AC
  Administered 2020-10-29: 10 mg via INTRAVENOUS
  Filled 2020-10-29: qty 10

## 2020-10-29 MED ORDER — FAMOTIDINE IN NACL 20-0.9 MG/50ML-% IV SOLN
20.0000 mg | Freq: Once | INTRAVENOUS | Status: AC
  Administered 2020-10-29: 20 mg via INTRAVENOUS

## 2020-10-29 MED ORDER — FAMOTIDINE IN NACL 20-0.9 MG/50ML-% IV SOLN
INTRAVENOUS | Status: AC
  Filled 2020-10-29: qty 50

## 2020-10-29 NOTE — Progress Notes (Signed)
Per Dr. [name], okay to treat with hgb 7.3.  Patient receiving 1 unit PRBCs today.

## 2020-10-29 NOTE — Assessment & Plan Note (Signed)
I have reviewed blood work with the patient and her husband extensively She is getting progressively anemic, multifactorial, likely secondary to side effects of chemotherapy and possibly bone marrow involvement I shared with them the results of her recent liver enzymes Her alkaline phosphatase is 4 times upper limit of normal compared to ALT and AST, suggestive of increased bone turnover She is also having some recent significant back pain which could be due to her disease We will proceed with chemotherapy along with supportive care with blood transfusion She is in agreement

## 2020-10-29 NOTE — Assessment & Plan Note (Signed)
The cause of her elevated liver enzymes are multifactorial The AST and ALT elevation is likely due to treatment The alkaline phosphatase could be related to treatment or bone marrow turnover given her severe anemia Observe closely for now We will proceed with treatment without delay 

## 2020-10-29 NOTE — Assessment & Plan Note (Signed)
She has been taking oxycodone every 4 hours regardless of pain control I told the patient not to take oxycodone and only use it as needed We discussed narcotic refill policy I refill her prescription today

## 2020-10-29 NOTE — Progress Notes (Signed)
[place] OFFICE PROGRESS NOTE  Patient Care Team: Lilian Coma., MD as PCP - General (Internal Medicine)  ASSESSMENT & PLAN:  Ovarian cancer on right [facility]) I have reviewed blood work with the patient and her husband extensively She is getting progressively anemic, multifactorial, likely secondary to side effects of chemotherapy and possibly bone marrow involvement I shared with them the results of her recent liver enzymes Her alkaline phosphatase is 4 times upper limit of normal compared to ALT and AST, suggestive of increased bone turnover She is also having some recent significant back pain which could be due to her disease We will proceed with chemotherapy along with supportive care with blood transfusion She is in agreement  Anemia due to antineoplastic chemotherapy We discussed some of the risks, benefits, and alternatives of blood transfusions. The patient is symptomatic from anemia and the hemoglobin level is critically low.  Some of the side-effects to be expected including risks of transfusion reactions, chills, infection, syndrome of volume overload and risk of hospitalization from various reasons and the patient is willing to proceed and went ahead to sign consent today. She will receive a unit of blood along with chemotherapy today  Elevated liver enzymes The cause of her elevated liver enzymes are multifactorial The AST and ALT elevation is likely due to treatment The alkaline phosphatase could be related to treatment or bone marrow turnover given her severe anemia Observe closely for now We will proceed with treatment without delay  Closed traumatic compression fracture of thoracic vertebra ([place]) She has been taking oxycodone every 4 hours regardless of pain control I told the patient not to take oxycodone and only use it as needed We discussed narcotic refill policy I refill her prescription today   Orders Placed This Encounter  Procedures  . CBC  with Differential/Platelet    Standing Status:   Standing    Number of Occurrences:   22    Standing Expiration Date:   10/29/2021  . Comprehensive metabolic panel    Standing Status:   Standing    Number of Occurrences:   33    Standing Expiration Date:   10/29/2021  . Prepare RBC (crossmatch)    Standing Status:   Standing    Number of Occurrences:   1    Order Specific Question:   # of Units    Answer:   1 unit    Order Specific Question:   Transfusion Indications    Answer:   Symptomatic Anemia    Order Specific Question:   Special Requirements    Answer:   Irradiated    Order Specific Question:   Number of Units to Keep Ahead    Answer:   NO units ahead    Order Specific Question:   Instructions:    Answer:   Transfuse    Order Specific Question:   If emergent release call blood bank    Answer:   Not emergent release    All questions were answered. The patient knows to call the clinic with any problems, questions or concerns. The total time spent in the appointment was 40 minutes encounter with patients including review of chart and various tests results, discussions about plan of care and coordination of care plan   Heath Lark, MD 10/29/2020 11:10 AM  INTERVAL HISTORY: Please see below for problem oriented charting. She returns with her husband for further follow-up and chemotherapy The patient has been taking oxycodone every 4 hours She even  set her alarm to wake her up in the middle of the night to take the pain medicine She actually has no pain when she take it every 4 hours She is afraid to stop taking pain medicine every 4 hours because she felt like she needed to stay ahead of it She denies nausea or constipation The patient denies any recent signs or symptoms of bleeding such as spontaneous epistaxis, hematuria or hematochezia.   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    03/20/2017 Imaging   GYN ultrasound was performed this revealed a small fundus measuring 5 cm there are 2 small fibroids within the fundus with the largest measuring 1.3 cm Within the endometrium there is a small amount a fluid the endometrial wall was slightly thickened at 4.8 mm the ultrasound was consistent with a small endometrial polyp  The right adnexa measured 7.4 x 6.7 cm contained a both a cystic and solid mass with a small amount of shadowing This is consistent with a dermoid tumor the left ovary was 2.6 x 1.5 x 1.0 cm There is no other adnexal masses no free fluid within the abdomen no ascites     04/10/2017 Surgery   Operation: Operative Laparscopy with  Peritoneal biopsy 2 hysteroscopy D&C Surgeon: Lynder Parents MD  Specimens: Peritoneal biopsy and cellular washings  Complications: None FIndings Small amount of ascites was found cellular washings were sent There was peritoneal studding And omental caking Even in steep Trendelenburg the ovaries and uterus were unable to be visualized due to the small intestines in the lower pelvis     04/10/2017 Pathology Results   Biopsy from [facility] was nondiagnostic.  Peritoneum biopsy did not reveal malignancy.  Endometrial biopsy also did not reveal malignancy.   04/13/2017 Tumor Marker   Patient's tumor was tested for the following markers: CA-125 Results of the tumor marker test revealed 324.1   04/20/2017 Tumor Marker   Patient's tumor was tested for the following markers: CA-125 Results of the tumor marker test revealed 345.6   04/22/2017 Pathology Results    Peritoneum, biopsy - CARCINOMA. - SEE COMMENT.   04/22/2017 Procedure   CT-guided core biopsy performed of peritoneal tumor in the left lower quadrant.   04/24/2017 - 09/25/2017 Chemotherapy   She received carboplatin and Taxol x 3 cycles followed by interval surgical debulking on 12/31. Chemotherapy was subsequently resumed for 3 more cycles   06/05/2017 Tumor Marker   Patient's tumor was tested for the following markers: CA-125 Results of the tumor marker test revealed 27.5   06/17/2017 Imaging   1. Considerable improvement in the prior omental caking, which now presents mainly as an indistinct stranding with slight nodularity along the left upper quadrant omentum. 2. Stable appearance of right ovarian mass with fatty and calcific elements classic for a dermoid. 3. There is accentuated enhancement in the walls of the common hepatic duct and common bile duct. A low-grade cholangitis is not readily excluded. 4. Other imaging findings of potential clinical significance: Mild cardiomegaly. Aortic Atherosclerosis (ICD10-I70.0). Lumbar scoliosis, spondylosis, degenerative  disc disease, and pars defects at L5. These cause impingement at L5-S1 and lesser impingement at L3-4 and L4-5. 5. 65% compression fracture at L1 slightly worsened than on the prior exam, with associated vertebral sclerosis and posterior retropulsion.   07/06/2017 Surgery   Bilateral Salpingo-Oophorectomy W/Omentectomy, Total Abd Hysterectomy & Radical Dissection For Debulking Right - Ureterolysis, With Or Without Repositioning Of Ureter For Retroperitoneal Fibrosis   07/06/2017 Pathology Results   A: Omentum, omentectomy - Omentum with minimal residual carcinoma, consistent with high grade serous carcinoma (microscopic; stage ypT3a) - Extensive treatment effect with calcifications, fibrosis, histiocytes, and giant cell response - CRS score 3  B: Uterus with cervix and left ovary and fallopian tube, hysterectomy and left  salpingo-oophorectomy - Cervix: Ectocervix and endocervix with no dysplasia or malignancy identified - Endometrium: Inactive with no hyperplasia, atypia, or malignancy identified - Myometrium: Adenomyosis and benign leiomyoma, size 1.2 cm - Serosa: Focal calcification and histiocytes suggestive of treatment effect, with no viable carcinoma identified - Ovary, left: Physiologic changes and no carcinoma identified - Fallopian tube, left: 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 and no malignancy identified  C: Ovary and fallopian tube, right, salpingo-oophorectomy - Right fallopian tube with focal residual carcinoma, consistent with high grade serous carcinoma, and serous tubal intraepithelial carcinoma (STIC) - Associated calcifications and histiocytes suggestive of treatment response - Right ovary with calcifications and histiocytes suggestive of treatment effect, with no definite viable carcinoma identified - Mature cystic teratoma, 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 - See synoptic report and comment   08/14/2017 Tumor Marker   Patient's tumor was tested for the following markers: CA-125 Results of the tumor marker test revealed 14.6   09/04/2017 Tumor Marker   Patient's tumor was tested for the following markers: CA-125 Results of the tumor marker test revealed 12.5   10/03/2017 Genetic Testing   The Common Hereditary Cancer Panel offered by [facility] includes sequencing and/or deletion duplication testing of the following 47 genes: APC, ATM, AXIN2, BARD1, BMPR1A, BRCA1, BRCA2, BRIP1, CDH1, CDKN2A (p14ARF), CDKN2A (p16INK4a), CKD4, CHEK2, CTNNA1, DICER1, EPCAM (Deletion/duplication testing only), GREM1 (promoter region deletion/duplication testing only), KIT, MEN1, MLH1, MSH2, MSH3, MSH6, MUTYH, NBN, NF1, NHTL1, PALB2, PDGFRA, PMS2, POLD1, POLE, PTEN, RAD50, RAD51C, RAD51D, SDHB, SDHC, SDHD, SMAD4, SMARCA4. STK11, TP53, TSC1, TSC2, and VHL.  The following genes were evaluated for sequence changes only: SDHA and  HOXB13 c.251G>A variant only.  Results: Negative, no pathogenic variants identified.  The date of this test report is 10/03/2017.    10/28/2017 Imaging   1. Stable pulmonary nodules. No new or progressive findings. 2. No residual or recurrent omental disease is identified. No obvious peritoneal surface disease. 3. Increasing soft tissue density and ill-defined interstitial changes around the abdominal wall surgical incision. This may be progressive postoperative changes or granulation tissue but attention on future scans to exclude the possibility of tumor. 4. Status post resection of right adnexal mass. No pelvic mass or pelvic adenopathy. 5. Stable T12 compression fracture.   01/25/2018 Tumor Marker   Patient's tumor was tested for the following markers: CA-125 Results of the tumor marker test revealed 9.2   12/10/2018 Tumor Marker   Patient's tumor was tested for the following markers: CA-125 Results of the tumor marker test revealed 212   12/22/2018 Imaging   1. Interval development of multiple soft tissue lesions in the gastrocolic ligament, mesentery, and along the peritoneal surface. These soft tissue lesions measure up to 6.5 cm in size. Associated interval development of moderate volume ascites in  the abdomen and pelvis. Imaging features are consistent with metastatic disease. 2. Soft tissue projects through the rectus sheath in the right paraumbilical region, likely herniated small bowel as there is a large amount of decompressed small bowel does deep to this finding. However, metastatic deposit at this location is also possibility.  3. Trace pneumobilia in the left liver suggests prior sphincterotomy. 4.  Aortic Atherosclerois (ICD10-170.0)   12/30/2018 Procedure   Successful ultrasound-guided therapeutic paracentesis yielding 4.5 liters of peritoneal fluid.   12/30/2018 Procedure   Technically successful right IJ power-injectable port catheter placement. Ready for routine use.    12/31/2018 -  Chemotherapy   The patient had carboplatin and taxol for chemotherapy treatment.     01/21/2019 Tumor Marker   Patient's tumor was tested for the following markers: CA-125 Results of the tumor marker test revealed 408   02/11/2019 Tumor Marker   Patient's tumor was tested for the following markers: CA-125 Results of the tumor marker test revealed 646.   03/02/2019 Imaging   1. Marked improvement in peritoneal carcinomatosis/ascites, without complete resolution. 2.  Aortic atherosclerosis (ICD10-170.0).     03/25/2019 Tumor Marker   Patient's tumor was tested for the following markers: CA-125 Results of the tumor marker test revealed 24.1.   04/15/2019 Tumor Marker   Patient's tumor was tested for the following markers: CA-125 Results of the tumor marker test revealed 15.3   05/18/2019 Imaging   1. There is progressive, nearly complete resolution of peritoneal and omental nodularity seen on prior examination with very faint residual peritoneal and omental haziness (series 2, image 50, 39).   2. Interval resolution of ascites.   3.  Status post hysterectomy and oophorectomy.   4.  Coronary artery disease.  Aortic Atherosclerosis (ICD10-I70.0).      Tumor Marker   Patient's tumor was tested for the following markers: CA-125 Results of the tumor marker test revealed 11.2.   06/10/2019 Tumor Marker   Patient's tumor was tested for the following markers: CA-125 Results of the tumor marker test revealed 9.1   07/29/2019 Tumor Marker   Patient's tumor was tested for the following markers: CA-125 Results of the tumor marker test revealed 8.8   08/18/2019 Imaging   1. No evidence of new or progressive metastatic disease in the abdomen or pelvis. Stable small nodular soft tissue focus at the right umbilicus. No new peritoneal implants. No ascites. 2. Chronic findings include: Aortic Atherosclerosis (ICD10-I70.0). Coronary atherosclerosis. Chronic moderate T12 vertebral  compression fractures.   09/09/2019 Tumor Marker   Patient's tumor was tested for the following markers: CA-125 Results of the tumor marker test revealed 11.4.   10/21/2019 Tumor Marker   Patient's tumor was tested for the following markers: CA-125 Results of the tumor marker test revealed 21.5   10/31/2019 Imaging   1. New hepatic and peritoneal metastatic disease. 2.  Aortic atherosclerosis (ICD10-I70.0).     11/11/2019 -  Chemotherapy   The patient had carboplatin and gemzar for chemotherapy treatment.     11/11/2019 Tumor Marker   Patient's tumor was tested for the following markers: CA-125 Results of the tumor marker test revealed 40.2    Genetic Testing   Patient has genetic testing done for PD-L1. Results revealed patient has the following:  PD-L1 staining in tumor cells (TC): <1% PD-L1 staining in tumor-associated immune cells (IC): 0% PD-L1 combined positive score (CPS): <1%   11/11/2019 - 05/11/2020 Chemotherapy   The patient had carboplatin and gemzar for chemotherapy treatment  12/16/2019 Tumor Marker   Patient's tumor was tested for the following markers: CA-125 Results of the tumor marker test revealed 41.7   01/13/2020 Tumor Marker   Patient's tumor was tested for the following markers: CA-125 Results of the tumor marker test revealed 18.9   02/09/2020 Imaging   1. Positive response to therapy. 2. Hypodense lesions in liver are smaller or no longer measurable. 3. No clear residual peritoneal metastasis. 4. No ascites. 5. Persistent enhancement of the common bile duct suggest biliary inflammation.     02/10/2020 Tumor Marker   Patient's tumor was tested for the following markers: CA-125 Results of the tumor marker test revealed 13.2.   03/19/2020 Tumor Marker   Patient's tumor was tested for the following markers: CA-125 Results of the tumor marker test revealed 15.5   04/13/2020 Tumor Marker   Patient's tumor was tested for the following markers:  CA-`125 Results of the tumor marker test revealed 17.9.   05/11/2020 Tumor Marker   Patient's tumor was tested for the following markers: CA-125 Results of the tumor marker test revealed 20.1.   05/24/2020 Imaging   1. Interval progression of soft tissue nodularity in the cul-de-sac, along the vaginal cuff. 2. Interval progression of inferior right liver lesion with new tiny capsular soft tissue lesions along the left liver and inferior right liver. Imaging features are highly concerning for metastatic disease. 3. Small volume ascites. 4. Similar appearance of focal enhancement in the region of the umbilicus. 5. Aortic Atherosclerosis (ICD10-I70.0).   06/01/2020 -  Chemotherapy   The patient had taxol for chemotherapy treatment.     06/08/2020 Tumor Marker   Patient's tumor was tested for the following markers: CA-125 Results of the tumor marker test revealed 26   07/13/2020 Tumor Marker   Patient's tumor was tested for the following markers: CA-125. Results of the tumor marker test revealed 17.3   08/17/2020 Tumor Marker   Patient's tumor was tested for the following markers: CA-125. Results of the tumor marker test revealed 16.7   09/06/2020 Imaging   Status post hysterectomy and bilateral salpingo-oophorectomy.   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   Progressive hepatic metastases.   No evidence of metastatic disease in the chest.   09/12/2020 -  Chemotherapy    Patient is on Treatment Plan: OVARIAN CARBOPLATIN AUC 5 Q21D        REVIEW OF SYSTEMS:   Constitutional: Denies fevers, chills or abnormal weight loss Eyes: Denies blurriness of vision Ears, nose, mouth, throat, and face: Denies mucositis or sore throat Respiratory: Denies cough, dyspnea or wheezes Cardiovascular: Denies palpitation, chest discomfort or lower extremity swelling Gastrointestinal:  Denies nausea, heartburn or change in bowel habits Skin: Denies abnormal skin  rashes Lymphatics: Denies new lymphadenopathy or easy bruising Neurological:Denies numbness, tingling or new weaknesses Behavioral/Psych: Mood is stable, no new changes  All other systems were reviewed with the patient and are negative.  I have reviewed the past medical history, past surgical history, social history and family history with the patient and they are unchanged from previous note.  ALLERGIES:  is allergic to aspirin, nsaids, and tramadol.  MEDICATIONS:  Current Outpatient Medications  Medication Sig Dispense Refill  . acetaminophen (TYLENOL) 650 MG CR tablet Take 1,300 mg by mouth every 8 (eight) hours.     Marland Kitchen atorvastatin (LIPITOR) 80 MG tablet Take 40 mg by mouth at bedtime.     . Cholecalciferol (VITAMIN D3) 2000 units TABS Take 2,000 Units by mouth  daily.    . citalopram (CELEXA) 10 MG tablet Take 10 mg by mouth at bedtime    . gabapentin (NEURONTIN) 300 MG capsule Take by mouth.    . Glucosamine Sulfate 500 MG TABS Take 500 mg by mouth 2 (two) times daily.     Marland Kitchen lidocaine-prilocaine (EMLA) cream Apply to affected area once 30 g 3  . loratadine (CLARITIN) 10 MG tablet Take 10 mg by mouth at bedtime.     Marland Kitchen losartan (COZAAR) 25 MG tablet Take 25 mg by mouth daily.     . meclizine (ANTIVERT) 25 MG tablet Take 25 mg by mouth 2 (two) times daily as needed for dizziness.     . metFORMIN (GLUCOPHAGE) 1000 MG tablet Take 1,000 mg by mouth 2 (two) times daily with a meal.     . omeprazole (PRILOSEC) 40 MG capsule Take 40 mg by mouth daily.     . ondansetron (ZOFRAN) 8 MG tablet Take 1 tablet (8 mg total) by mouth every 8 (eight) hours as needed for refractory nausea / vomiting. 30 tablet 1  . oxyCODONE (OXY IR/ROXICODONE) 5 MG immediate release tablet Take 1 tablet (5 mg total) by mouth every 4 (four) hours as needed for severe pain. 60 tablet 0  . prochlorperazine (COMPAZINE) 10 MG tablet Take 1 tablet (10 mg total) by mouth every 6 (six) hours as needed (Nausea or vomiting). 60  tablet 1  . Pyridoxine HCl (VITAMIN B-6 PO) Take 50 mg by mouth 1 day or 1 dose.    . vitamin B-12 (CYANOCOBALAMIN) 1000 MCG tablet Take 1,000 mcg by mouth daily.      No current facility-administered medications for this visit.   Facility-Administered Medications Ordered in Other Visits  Medication Dose Route Frequency Provider Last Rate Last Admin  . 0.9 %  sodium chloride infusion (Manually program via Guardrails IV Fluids)  250 mL Intravenous Once [name], Jadyn Brasher, MD      . CARBOplatin (PARAPLATIN) 380 mg in sodium chloride 0.9 % 250 mL chemo infusion  380 mg Intravenous Once [name], Roscoe Witts, MD 576 mL/hr at 10/29/20 1055 380 mg at 10/29/20 1055  . heparin lock flush 100 unit/mL  500 Units Intracatheter Once PRN [name], Rosey Eide, MD      . sodium chloride flush (NS) 0.9 % injection 10 mL  10 mL Intracatheter Once [name], Boluwatife Mutchler, MD      . sodium chloride flush (NS) 0.9 % injection 10 mL  10 mL Intracatheter PRN [name], Cecil Bixby, MD        PHYSICAL EXAMINATION: ECOG PERFORMANCE STATUS: 2 - Symptomatic, <50% confined to bed  Vitals:   10/29/20 0819  BP: (!) 108/55  Pulse: (!) 101  Resp: 18  Temp: 99.1 F (37.3 C)  SpO2: 95%   Filed Weights   10/29/20 0819  Weight: 183 lb 6.4 oz (83.2 kg)    GENERAL:alert, no distress and comfortable.  She looks pale SKIN: skin color, texture, turgor are normal, no rashes or significant lesions EYES: normal, Conjunctiva are pink and non-injected, sclera clear OROPHARYNX:no exudate, no erythema and lips, buccal mucosa, and tongue normal  NECK: supple, thyroid normal size, non-tender, without nodularity LYMPH:  no palpable lymphadenopathy in the cervical, axillary or inguinal LUNGS: clear to auscultation and percussion with normal breathing effort HEART: regular rate & rhythm and no murmurs and no lower extremity edema ABDOMEN:abdomen soft, non-tender and normal bowel sounds Musculoskeletal:no cyanosis of digits and no clubbing  NEURO: alert & oriented x 3 with  fluent  speech, no focal motor/sensory deficits  LABORATORY DATA:  I have reviewed the data as listed    Component Value Date/Time   NA 139 10/29/2020 0739   NA 139 06/05/2017 0746   K 4.3 10/29/2020 0739   K 4.1 06/05/2017 0746   CL 102 10/29/2020 0739   CO2 25 10/29/2020 0739   CO2 21 (L) 06/05/2017 0746   GLUCOSE 124 (H) 10/29/2020 0739   GLUCOSE 279 (H) 06/05/2017 0746   BUN 25 (H) 10/29/2020 0739   BUN 15.0 06/05/2017 0746   CREATININE 0.77 10/29/2020 0739   CREATININE 0.9 06/05/2017 0746   CALCIUM 8.9 10/29/2020 0739   CALCIUM 9.1 01/13/2020 0735   CALCIUM 10.1 06/05/2017 0746   PROT 8.1 10/29/2020 0739   PROT 8.1 06/05/2017 0746   ALBUMIN 2.5 (L) 10/29/2020 0739   ALBUMIN 4.0 06/05/2017 0746   AST 63 (H) 10/29/2020 0739   AST 22 06/05/2017 0746   ALT 36 10/29/2020 0739   ALT 36 06/05/2017 0746   ALKPHOS 412 (H) 10/29/2020 0739   ALKPHOS 98 06/05/2017 0746   BILITOT 0.4 10/29/2020 0739   BILITOT 0.44 06/05/2017 0746   GFRNONAA >60 10/29/2020 0739   GFRAA >60 03/30/2020 0734    No results found for: SPEP, UPEP  Lab Results  Component Value Date   WBC 4.2 10/29/2020   NEUTROABS 2.7 10/29/2020   HGB 7.3 (L) 10/29/2020   HCT 24.0 (L) 10/29/2020   MCV 101.7 (H) 10/29/2020   PLT 185 10/29/2020      Chemistry      Component Value Date/Time   NA 139 10/29/2020 0739   NA 139 06/05/2017 0746   K 4.3 10/29/2020 0739   K 4.1 06/05/2017 0746   CL 102 10/29/2020 0739   CO2 25 10/29/2020 0739   CO2 21 (L) 06/05/2017 0746   BUN 25 (H) 10/29/2020 0739   BUN 15.0 06/05/2017 0746   CREATININE 0.77 10/29/2020 0739   CREATININE 0.9 06/05/2017 0746      Component Value Date/Time   CALCIUM 8.9 10/29/2020 0739   CALCIUM 9.1 01/13/2020 0735   CALCIUM 10.1 06/05/2017 0746   ALKPHOS 412 (H) 10/29/2020 0739   ALKPHOS 98 06/05/2017 0746   AST 63 (H) 10/29/2020 0739   AST 22 06/05/2017 0746   ALT 36 10/29/2020 0739   ALT 36 06/05/2017 0746   BILITOT 0.4 10/29/2020  0739   BILITOT 0.44 06/05/2017 0746

## 2020-10-29 NOTE — Assessment & Plan Note (Signed)
We discussed some of the risks, benefits, and alternatives of blood transfusions. The patient is symptomatic from anemia and the hemoglobin level is critically low.  Some of the side-effects to be expected including risks of transfusion reactions, chills, infection, syndrome of volume overload and risk of hospitalization from various reasons and the patient is willing to proceed and went ahead to sign consent today. She will receive a unit of blood along with chemotherapy today 

## 2020-10-29 NOTE — Patient Instructions (Addendum)
[facility]  Discharge Instructions: Thank you for choosing [facility] to provide your oncology and hematology care.   If you have a lab appointment with the [facility], please go directly to the [facility] and check in at the registration area.   Wear comfortable clothing and clothing appropriate for easy access to any Portacath or PICC line.   We strive to give you quality time with your provider. You may need to reschedule your appointment if you arrive late (15 or more minutes).  Arriving late affects you and other patients whose appointments are after yours.  Also, if you miss three or more appointments without notifying the office, you may be dismissed from the clinic at the provider's discretion.      For prescription refill requests, have your pharmacy contact our office and allow 72 hours for refills to be completed.    Today you received the following chemotherapy and/or immunotherapy agents carboplatin.   To help prevent nausea and vomiting after your treatment, we encourage you to take your nausea medication as directed.  BELOW ARE SYMPTOMS THAT SHOULD BE REPORTED IMMEDIATELY: . *FEVER GREATER THAN 100.4 F (38 C) OR HIGHER . *CHILLS OR SWEATING . *NAUSEA AND VOMITING THAT IS NOT CONTROLLED WITH YOUR NAUSEA MEDICATION . *UNUSUAL SHORTNESS OF BREATH . *UNUSUAL BRUISING OR BLEEDING . *URINARY PROBLEMS (pain or burning when urinating, or frequent urination) . *BOWEL PROBLEMS (unusual diarrhea, constipation, pain near the anus) . TENDERNESS IN MOUTH AND THROAT WITH OR WITHOUT PRESENCE OF ULCERS (sore throat, sores in mouth, or a toothache) . UNUSUAL RASH, SWELLING OR PAIN  . UNUSUAL VAGINAL DISCHARGE OR ITCHING   Items with * indicate a potential emergency and should be followed up as soon as possible or go to the Emergency Department if any problems should occur.  Please show the CHEMOTHERAPY ALERT CARD or IMMUNOTHERAPY ALERT  CARD at check-in to the Emergency Department and triage nurse.  Should you have questions after your visit or need to cancel or reschedule your appointment, please contact [facility]  Dept: [phone]  and follow the prompts.  Office hours are 8:00 a.m. to 4:30 p.m. Monday - Friday. Please note that voicemails left after 4:00 p.m. may not be returned until the following business day.  We are closed weekends and major holidays. You have access to a nurse at all times for urgent questions. Please call the main number to the clinic Dept: [phone] and follow the prompts.   For any non-urgent questions, you may also contact your provider using MyChart. We now offer e-Visits for anyone 32 and older to request care online for non-urgent symptoms. For details visit [email or web address].   Also download the MyChart app! Go to the app store, search "MyChart", open the app, select [facility], and log in with your MyChart username and password.  Due to Covid, a mask is required upon entering the hospital/clinic. If you do not have a mask, one will be given to you upon arrival. For doctor visits, patients may have 1 support person aged 72 or older with them. For treatment visits, patients cannot have anyone with them due to current Covid guidelines and our immunocompromised population.   Blood Transfusion, Adult, Care After This sheet gives you information about how to care for yourself after your procedure. Your doctor may also give you more specific instructions. If you have problems or questions, contact your doctor. What can I expect after  the procedure? After the procedure, it is common to have:  Bruising and soreness at the IV site.  A fever or chills on the day of the procedure. This may be your body's response to the new blood cells received.  A headache. Follow these instructions at home: Insertion site care  Follow instructions from your doctor about how  to take care of your insertion site. This is where an IV tube was put into your vein. Make sure you: ? Wash your hands with soap and water before and after you change your bandage (dressing). If you cannot use soap and water, use hand sanitizer. ? Change your bandage as told by your doctor.  Check your insertion site every day for signs of infection. Check for: ? Redness, swelling, or pain. ? Bleeding from the site. ? Warmth. ? Pus or a bad smell.      General instructions  Take over-the-counter and prescription medicines only as told by your doctor.  Rest as told by your doctor.  Go back to your normal activities as told by your doctor.  Keep all follow-up visits as told by your doctor. This is important. Contact a doctor if:  You have itching or red, swollen areas of skin (hives).  You feel worried or nervous (anxious).  You feel weak after doing your normal activities.  You have redness, swelling, warmth, or pain around the insertion site.  You have blood coming from the insertion site, and the blood does not stop with pressure.  You have pus or a bad smell coming from the insertion site. Get help right away if:  You have signs of a serious reaction. This may be coming from an allergy or the body's defense system (immune system). Signs include: ? Trouble breathing or shortness of breath. ? Swelling of the face or feeling warm (flushed). ? Fever or chills. ? Head, chest, or back pain. ? Dark pee (urine) or blood in the pee. ? Widespread rash. ? Fast heartbeat. ? Feeling dizzy or light-headed. You may receive your blood transfusion in an outpatient setting. If so, you will be told whom to contact to report any reactions. These symptoms may be an emergency. Do not wait to see if the symptoms will go away. Get medical help right away. Call your local emergency services (911 in the [place] Do not drive yourself to the hospital. Summary  Bruising and soreness at the IV  site are common.  Check your insertion site every day for signs of infection.  Rest as told by your doctor. Go back to your normal activities as told by your doctor.  Get help right away if you have signs of a serious reaction. This information is not intended to replace advice given to you by your health care provider. Make sure you discuss any questions you have with your health care provider. Document Revised: 12/16/2018 Document Reviewed: 12/16/2018 Elsevier Patient Education  [place].

## 2020-10-30 ENCOUNTER — Telehealth: Payer: Self-pay

## 2020-10-30 LAB — TYPE AND SCREEN
ABO/RH(D): O POS
Antibody Screen: NEGATIVE
Unit division: 0

## 2020-10-30 LAB — BPAM RBC
Blood Product Expiration Date: 202205192359
ISSUE DATE / TIME: 202204251134
Unit Type and Rh: 5100

## 2020-10-30 NOTE — Telephone Encounter (Signed)
Called and given below message. She verbalized understanding. 

## 2020-10-30 NOTE — Telephone Encounter (Signed)
No, that is unrelated The elevated alkaline phosphatase is from her bone disease

## 2020-10-30 NOTE — Telephone Encounter (Signed)
She called and left a message. She drinks Alkaline water at home. Should she stop the Alkaline water due to her elevated Alkaline Phosphatase?

## 2020-11-16 MED FILL — Fosaprepitant Dimeglumine For IV Infusion 150 MG (Base Eq): INTRAVENOUS | Qty: 5 | Status: AC

## 2020-11-16 MED FILL — Dexamethasone Sodium Phosphate Inj 100 MG/10ML: INTRAMUSCULAR | Qty: 1 | Status: AC

## 2020-11-19 ENCOUNTER — Other Ambulatory Visit: Payer: Self-pay

## 2020-11-19 ENCOUNTER — Inpatient Hospital Stay: Payer: Medicare Other | Attending: Gynecology

## 2020-11-19 ENCOUNTER — Inpatient Hospital Stay (HOSPITAL_BASED_OUTPATIENT_CLINIC_OR_DEPARTMENT_OTHER): Payer: Medicare Other | Admitting: Hematology and Oncology

## 2020-11-19 ENCOUNTER — Telehealth: Payer: Self-pay | Admitting: Hematology and Oncology

## 2020-11-19 ENCOUNTER — Other Ambulatory Visit: Payer: Medicare Other

## 2020-11-19 ENCOUNTER — Encounter: Payer: Self-pay | Admitting: Hematology and Oncology

## 2020-11-19 ENCOUNTER — Inpatient Hospital Stay: Payer: Medicare Other

## 2020-11-19 VITALS — BP 119/57 | HR 105 | Temp 99.4°F | Resp 18 | Ht 67.0 in | Wt 188.0 lb

## 2020-11-19 DIAGNOSIS — R180 Malignant ascites: Secondary | ICD-10-CM | POA: Insufficient documentation

## 2020-11-19 DIAGNOSIS — G893 Neoplasm related pain (acute) (chronic): Secondary | ICD-10-CM | POA: Insufficient documentation

## 2020-11-19 DIAGNOSIS — E43 Unspecified severe protein-calorie malnutrition: Secondary | ICD-10-CM | POA: Insufficient documentation

## 2020-11-19 DIAGNOSIS — D6481 Anemia due to antineoplastic chemotherapy: Secondary | ICD-10-CM

## 2020-11-19 DIAGNOSIS — C561 Malignant neoplasm of right ovary: Secondary | ICD-10-CM

## 2020-11-19 DIAGNOSIS — C787 Secondary malignant neoplasm of liver and intrahepatic bile duct: Secondary | ICD-10-CM | POA: Insufficient documentation

## 2020-11-19 DIAGNOSIS — R748 Abnormal levels of other serum enzymes: Secondary | ICD-10-CM | POA: Insufficient documentation

## 2020-11-19 DIAGNOSIS — T451X5A Adverse effect of antineoplastic and immunosuppressive drugs, initial encounter: Secondary | ICD-10-CM

## 2020-11-19 DIAGNOSIS — M5442 Lumbago with sciatica, left side: Secondary | ICD-10-CM | POA: Diagnosis not present

## 2020-11-19 DIAGNOSIS — M549 Dorsalgia, unspecified: Secondary | ICD-10-CM | POA: Diagnosis not present

## 2020-11-19 DIAGNOSIS — D539 Nutritional anemia, unspecified: Secondary | ICD-10-CM

## 2020-11-19 LAB — CBC WITH DIFFERENTIAL/PLATELET
Abs Immature Granulocytes: 0.02 10*3/uL (ref 0.00–0.07)
Basophils Absolute: 0 10*3/uL (ref 0.0–0.1)
Basophils Relative: 1 %
Eosinophils Absolute: 0 10*3/uL (ref 0.0–0.5)
Eosinophils Relative: 1 %
HCT: 25 % — ABNORMAL LOW (ref 36.0–46.0)
Hemoglobin: 7.7 g/dL — ABNORMAL LOW (ref 12.0–15.0)
Immature Granulocytes: 0 %
Lymphocytes Relative: 15 %
Lymphs Abs: 0.8 10*3/uL (ref 0.7–4.0)
MCH: 30.8 pg (ref 26.0–34.0)
MCHC: 30.8 g/dL (ref 30.0–36.0)
MCV: 100 fL (ref 80.0–100.0)
Monocytes Absolute: 0.6 10*3/uL (ref 0.1–1.0)
Monocytes Relative: 10 %
Neutro Abs: 4.1 10*3/uL (ref 1.7–7.7)
Neutrophils Relative %: 73 %
Platelets: 203 10*3/uL (ref 150–400)
RBC: 2.5 MIL/uL — ABNORMAL LOW (ref 3.87–5.11)
RDW: 19.3 % — ABNORMAL HIGH (ref 11.5–15.5)
WBC: 5.6 10*3/uL (ref 4.0–10.5)
nRBC: 0 % (ref 0.0–0.2)

## 2020-11-19 LAB — COMPREHENSIVE METABOLIC PANEL
ALT: 22 U/L (ref 0–44)
AST: 53 U/L — ABNORMAL HIGH (ref 15–41)
Albumin: 1.9 g/dL — ABNORMAL LOW (ref 3.5–5.0)
Alkaline Phosphatase: 387 U/L — ABNORMAL HIGH (ref 38–126)
Anion gap: 11 (ref 5–15)
BUN: 16 mg/dL (ref 8–23)
CO2: 25 mmol/L (ref 22–32)
Calcium: 7.9 mg/dL — ABNORMAL LOW (ref 8.9–10.3)
Chloride: 103 mmol/L (ref 98–111)
Creatinine, Ser: 0.7 mg/dL (ref 0.44–1.00)
GFR, Estimated: >60 mL/min (ref >60)
Glucose, Bld: 143 mg/dL — ABNORMAL HIGH (ref 70–99)
Potassium: 4 mmol/L (ref 3.5–5.1)
Sodium: 139 mmol/L (ref 135–145)
Total Bilirubin: 0.5 mg/dL (ref 0.3–1.2)
Total Protein: 8 g/dL (ref 6.5–8.1)

## 2020-11-19 LAB — SAMPLE TO BLOOD BANK

## 2020-11-19 LAB — PREPARE RBC (CROSSMATCH)

## 2020-11-19 MED ORDER — HEPARIN SOD (PORK) LOCK FLUSH 100 UNIT/ML IV SOLN
500.0000 [IU] | Freq: Every day | INTRAVENOUS | Status: AC | PRN
Start: 2020-11-19 — End: 2020-11-19
  Administered 2020-11-19: 500 [IU]
  Filled 2020-11-19: qty 5

## 2020-11-19 MED ORDER — SODIUM CHLORIDE 0.9% IV SOLUTION
250.0000 mL | Freq: Once | INTRAVENOUS | Status: AC
  Administered 2020-11-19: 250 mL via INTRAVENOUS
  Filled 2020-11-19: qty 250

## 2020-11-19 MED ORDER — SODIUM CHLORIDE 0.9% FLUSH
10.0000 mL | INTRAVENOUS | Status: AC | PRN
  Administered 2020-11-19: 10 mL
  Filled 2020-11-19: qty 10

## 2020-11-19 MED ORDER — DIPHENHYDRAMINE HCL 25 MG PO CAPS
25.0000 mg | ORAL_CAPSULE | Freq: Once | ORAL | Status: AC
  Administered 2020-11-19: 25 mg via ORAL

## 2020-11-19 MED ORDER — ACETAMINOPHEN 325 MG PO TABS: 650.0000 mg | ORAL_TABLET | Freq: Once | ORAL | Status: DC

## 2020-11-19 NOTE — Assessment & Plan Note (Signed)
She has obvious malignant ascites on exam She is symptomatic I will order a paracentesis and I will order CT imaging for assessment

## 2020-11-19 NOTE — Telephone Encounter (Signed)
Scheduled per los. Gave avs and calendar  

## 2020-11-19 NOTE — Patient Instructions (Signed)
Come to [facility] admitting at 9:15 am on Tuesday, 11/20/2020. You are scheduled for a paracentesis @ 9:30 am.  Blood Transfusion, Adult A blood transfusion is a procedure in which you receive blood or a type of blood cell (blood component) through an IV. You may need a blood transfusion when your blood level is low. This may result from a bleeding disorder, illness, injury, or surgery. The blood may come from a donor. You may also be able to donate blood for yourself (autologous blood donation) before a planned surgery. The blood given in a transfusion is made up of different blood components. You may receive:  Red blood cells. These carry oxygen to the cells in the body.  Platelets. These help your blood to clot.  Plasma. This is the liquid part of your blood. It carries proteins and other substances throughout the body.  White blood cells. These help you fight infections. If you have hemophilia or another clotting disorder, you may also receive other types of blood products. Tell a health care provider about:  Any blood disorders you have.  Any previous reactions you have had during a blood transfusion.  Any allergies you have.  All medicines you are taking, including vitamins, herbs, eye drops, creams, and over-the-counter medicines.  Any surgeries you have had.  Any medical conditions you have, including any recent fever or cold symptoms.  Whether you are pregnant or may be pregnant. What are the risks? Generally, this is a safe procedure. However, problems may occur.  The most common problems include: ? A mild allergic reaction, such as red, swollen areas of skin (hives) and itching. ? Fever or chills. This may be the body's response to new blood cells received. This may occur during or up to 4 hours after the transfusion.  More serious problems may include: ? Transfusion-associated circulatory overload (TACO), or too much fluid in the lungs. This may cause  breathing problems. ? A serious allergic reaction, such as difficulty breathing or swelling around the face and lips. ? Transfusion-related acute lung injury (TRALI), which causes breathing difficulty and low oxygen in the blood. This can occur within hours of the transfusion or several days later. ? Iron overload. This can happen after receiving many blood transfusions over a period of time. ? Infection or virus being transmitted. This is rare because donated blood is carefully tested before it is given. ? Hemolytic transfusion reaction. This is rare. It happens when your body's defense system (immune system)tries to attack the new blood cells. Symptoms may include fever, chills, nausea, low blood pressure, and low back or chest pain. ? Transfusion-associated graft-versus-host disease (TAGVHD). This is rare. It happens when donated cells attack your body's healthy tissues. What happens before the procedure? Medicines Ask your health care provider about:  Changing or stopping your regular medicines. This is especially important if you are taking diabetes medicines or blood thinners.  Taking medicines such as aspirin and ibuprofen. These medicines can thin your blood. Do not take these medicines unless your health care provider tells you to take them.  Taking over-the-counter medicines, vitamins, herbs, and supplements. General instructions  Follow instructions from your health care provider about eating and drinking restrictions.  You will have a blood test to determine your blood type. This is necessary to know what kind of blood your body will accept and to match it to the donor blood.  If you are going to have a planned surgery, you may be able to do  an autologous blood donation. This may be done in case you need to have a transfusion.  You will have your temperature, blood pressure, and pulse monitored before the transfusion.  If you have had an allergic reaction to a transfusion in the  past, you may be given medicine to help prevent a reaction. This medicine may be given to you by mouth (orally) or through an IV.  Set aside time for the blood transfusion. This procedure generally takes 1-4 hours to complete. What happens during the procedure?  An IV will be inserted into one of your veins.  The bag of donated blood will be attached to your IV. The blood will then enter through your vein.  Your temperature, blood pressure, and pulse will be monitored regularly during the transfusion. This monitoring is done to detect early signs of a transfusion reaction.  Tell your nurse right away if you have any of these symptoms during the transfusion: ? Shortness of breath or trouble breathing. ? Chest or back pain. ? Fever or chills. ? Hives or itching.  If you have any signs or symptoms of a reaction, your transfusion will be stopped and you may be given medicine.  When the transfusion is complete, your IV will be removed.  Pressure may be applied to the IV site for a few minutes.  A bandage (dressing)will be applied. The procedure may vary among health care providers and hospitals.   What happens after the procedure?  Your temperature, blood pressure, pulse, breathing rate, and blood oxygen level will be monitored until you leave the hospital or clinic.  Your blood may be tested to see how you are responding to the transfusion.  You may be warmed with fluids or blankets to maintain a normal body temperature.  If you receive your blood transfusion in an outpatient setting, you will be told whom to contact to report any reactions. Where to find more information For more information on blood transfusions, visit the American Red Cross: redcross.org Summary  A blood transfusion is a procedure in which you receive blood or a type of blood cell (blood component) through an IV.  The blood you receive may come from a donor or be donated by yourself (autologous blood donation)  before a planned surgery.  The blood given in a transfusion is made up of different blood components. You may receive red blood cells, platelets, plasma, or white blood cells depending on the condition treated.  Your temperature, blood pressure, and pulse will be monitored before, during, and after the transfusion.  After the transfusion, your blood may be tested to see how your body has responded. This information is not intended to replace advice given to you by your health care provider. Make sure you discuss any questions you have with your health care provider. Document Revised: 04/28/2019 Document Reviewed: 12/16/2018 Elsevier Patient Education  [place].

## 2020-11-19 NOTE — Assessment & Plan Note (Signed)
She is symptomatic from tachycardia and feeling somewhat weak We discussed some of the risks, benefits, and alternatives of blood transfusions. The patient is symptomatic from anemia and the hemoglobin level is critically low.  Some of the side-effects to be expected including risks of transfusion reactions, chills, infection, syndrome of volume overload and risk of hospitalization from various reasons and the patient is willing to proceed and went ahead to sign consent today. I do not believe carboplatin is helping I do not recommend we proceed with treatment as it can cause further pancytopenia

## 2020-11-19 NOTE — Assessment & Plan Note (Signed)
Suspect this is due to malignancy Her pain is well controlled with current prescribed pain medicine She will continue the same I reminded her narcotic refill policy

## 2020-11-19 NOTE — Assessment & Plan Note (Signed)
She is developing severe protein calorie malnutrition with progressive low albumin Even though she has gained some weight, the weight gain is due to her ascites I will order CT imaging as above

## 2020-11-19 NOTE — Assessment & Plan Note (Signed)
Clinically, she is progressing on treatment She has now recurrent ascites I will stop her chemotherapy and order urgent CT imaging this week Today, we will provide supportive care with blood transfusion I will order urgent paracentesis as she has symptoms with her recurrent ascites

## 2020-11-19 NOTE — Assessment & Plan Note (Signed)
The elevated liver enzymes could be related to her abdominal disease

## 2020-11-19 NOTE — Progress Notes (Signed)
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 The patient is symptomatic from anemia and the hemoglobin level is critically low.  Some of the side-effects to be expected including risks of transfusion reactions, chills, infection, syndrome of volume overload and risk of hospitalization from various reasons and the patient is willing to proceed and went ahead to sign consent today. I do not believe carboplatin is helping I do not recommend we proceed with treatment as it can cause further pancytopenia  Malignant ascites She has obvious malignant ascites on exam She is symptomatic I will order a paracentesis and I will order CT imaging for assessment  Acute back pain Suspect this is due to malignancy Her pain is well controlled with current prescribed pain medicine She will continue the same I reminded her narcotic refill policy  Elevated liver enzymes The elevated liver enzymes could be related to her abdominal disease  Severe protein-calorie malnutrition ([place]) She is developing severe protein calorie malnutrition with progressive low albumin Even though she has gained some weight, the weight gain is due to her ascites I will order CT imaging as above   Orders Placed This Encounter  Procedures  . US Paracentesis    Standing Status:   Future    Standing Expiration Date:   11/19/2021     Order Specific Question:   If therapeutic, is there a maximum amount of fluid to be removed?    Answer:   Yes    Order Specific Question:   What is the maximum amount of fluide to be removed?    Answer:   5 L    Order Specific Question:   Are labs required for specimen collection?    Answer:   No    Order Specific Question:   Is Albumin medication needed?    Answer:   No    Order Specific Question:   Reason for Exam (SYMPTOM  OR DIAGNOSIS REQUIRED)    Answer:   recurrent malignant ascites    Order Specific Question:   Preferred imaging location?    Answer:   [facility]  . CT Abdomen Pelvis Wo Contrast    Standing Status:   Future    Standing Expiration Date:   11/19/2021    Order Specific Question:   Preferred imaging location?    Answer:   [facility]    Order Specific Question:   Is Oral Contrast requested for this exam?    Answer:   Yes, Per Radiology protocol  . Informed Consent Details: Physician/Practitioner Attestation; Transcribe to consent form and obtain patient signature    Standing Status:   Future    Standing Expiration Date:   11/19/2021    Order Specific Question:   Physician/Practitioner attestation of informed consent for blood and or blood product transfusion    Answer:   I, the physician/practitioner, attest that I have discussed with the patient the benefits, risks, side effects, alternatives, likelihood of  achieving goals and potential problems during recovery for the procedure that I have provided informed consent.    Order Specific Question:   Product(s)    Answer:   All Product(s)  . Care order/instruction    Transfuse Parameters    Standing Status:   Future    Number of Occurrences:   1    Standing Expiration Date:   11/19/2021  . Type and screen    Standing Status:   Future    Number of Occurrences:   1    Standing Expiration Date:   11/19/2021    All questions were answered. The patient knows to call the clinic with any problems,  questions or concerns. The total time spent in the appointment was 40 minutes encounter with patients including review of chart and various tests results, discussions about plan of care and coordination of care plan   Heath Lark, MD 11/19/2020 9:24 AM  INTERVAL HISTORY: Please see below for problem oriented charting. She returns with her husband for further follow-up She has noted progressive abdominal distention and felt somewhat uncomfortable Her back pain is stable with current prescribed oxycodone She appears somewhat pale  The patient denies any recent signs or symptoms of bleeding such as spontaneous epistaxis, hematuria or hematochezia. 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    03/20/2017 Imaging   GYN ultrasound was performed this revealed a small fundus measuring 5 cm there are 2 small fibroids within the fundus with the largest measuring 1.3 cm Within the endometrium there is a small amount a fluid the endometrial wall was slightly thickened at 4.8 mm the ultrasound was consistent with a small endometrial polyp  The right adnexa measured 7.4 x 6.7 cm contained a both a cystic and solid mass with a small amount of shadowing This is consistent with a dermoid tumor the left ovary was 2.6 x 1.5 x 1.0 cm There is no other adnexal masses no free fluid within the abdomen no ascites     04/10/2017 Surgery   Operation: Operative Laparscopy with  Peritoneal biopsy 2 hysteroscopy D&C Surgeon: Lynder Parents  MD  Specimens: Peritoneal biopsy and cellular washings  Complications: None FIndings Small amount of ascites was found cellular washings were sent There was peritoneal studding And omental caking Even in steep Trendelenburg the ovaries and uterus were unable to be visualized due to the small intestines in the lower pelvis     04/10/2017 Pathology Results   Biopsy from [facility] was nondiagnostic.  Peritoneum biopsy did not reveal malignancy.  Endometrial biopsy also did not reveal malignancy.   04/13/2017 Tumor Marker   Patient's tumor was tested for the following markers: CA-125 Results of the tumor marker test revealed 324.1   04/20/2017 Tumor Marker   Patient's tumor was tested for the following markers: CA-125 Results of the tumor marker test revealed 345.6   04/22/2017 Pathology Results   Peritoneum, biopsy - CARCINOMA. - SEE COMMENT.   04/22/2017 Procedure   CT-guided core biopsy performed of  peritoneal tumor in the left lower quadrant.   04/24/2017 - 09/25/2017 Chemotherapy   She received carboplatin and Taxol x 3 cycles followed by interval surgical debulking on 12/31. Chemotherapy was subsequently resumed for 3 more cycles   06/05/2017 Tumor Marker   Patient's tumor was tested for the following markers: CA-125 Results of the tumor marker test revealed 27.5   06/17/2017 Imaging   1. Considerable improvement in the prior omental caking, which now presents mainly as an indistinct stranding with slight nodularity along the left upper quadrant omentum. 2. Stable appearance of right ovarian mass with fatty and calcific elements classic for a dermoid. 3. There is accentuated enhancement in the walls of the common hepatic duct and common bile duct. A low-grade cholangitis is not readily excluded. 4. Other imaging findings of potential clinical significance: Mild cardiomegaly. Aortic Atherosclerosis (ICD10-I70.0). Lumbar scoliosis, spondylosis,  degenerative disc disease, and pars defects at L5. These cause impingement at L5-S1 and lesser impingement at L3-4 and L4-5. 5. 65% compression fracture at L1 slightly worsened than on the prior exam, with associated vertebral sclerosis and posterior retropulsion.   07/06/2017 Surgery   Bilateral Salpingo-Oophorectomy W/Omentectomy, Total Abd Hysterectomy & Radical Dissection For Debulking Right - Ureterolysis, With Or Without Repositioning Of Ureter For Retroperitoneal Fibrosis   07/06/2017 Pathology Results   A: Omentum, omentectomy - Omentum with minimal residual carcinoma, consistent with high grade serous carcinoma (microscopic; stage ypT3a) - Extensive treatment effect with calcifications, fibrosis, histiocytes, and giant cell response - CRS score 3  B: Uterus with cervix and left ovary and fallopian tube, hysterectomy and left salpingo-oophorectomy - Cervix: Ectocervix and endocervix with no dysplasia or malignancy identified - Endometrium: Inactive with no hyperplasia, atypia, or malignancy identified - Myometrium: Adenomyosis and benign leiomyoma, size 1.2 cm - Serosa: Focal calcification and histiocytes suggestive of treatment effect, with no viable carcinoma identified - Ovary, left: Physiologic changes and no carcinoma identified - Fallopian tube, left: 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 and no malignancy identified  C: Ovary and fallopian tube, right, salpingo-oophorectomy - Right fallopian tube with focal residual carcinoma, consistent with high grade serous carcinoma, and serous tubal intraepithelial carcinoma (STIC) - Associated calcifications and histiocytes suggestive of treatment response - Right ovary with calcifications and histiocytes suggestive of treatment effect, with no definite viable carcinoma identified - Mature cystic teratoma, 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 - See synoptic report and comment   08/14/2017 Tumor Marker   Patient's tumor was tested for the following  markers: CA-125 Results of the tumor marker test revealed 14.6   09/04/2017 Tumor Marker   Patient's tumor was tested for the following markers: CA-125 Results of the tumor marker test revealed 12.5   10/03/2017 Genetic Testing   The Common Hereditary Cancer Panel offered by [facility] includes sequencing and/or deletion duplication testing of the following 47 genes: APC, ATM, AXIN2, BARD1, BMPR1A, BRCA1, BRCA2, BRIP1, CDH1, CDKN2A (p14ARF), CDKN2A (p16INK4a), CKD4, CHEK2, CTNNA1, DICER1, EPCAM (Deletion/duplication testing only), GREM1 (promoter region deletion/duplication testing only), KIT, MEN1, MLH1, MSH2, MSH3, MSH6, MUTYH, NBN, NF1, NHTL1, PALB2, PDGFRA, PMS2, POLD1, POLE, PTEN, RAD50, RAD51C, RAD51D, SDHB, SDHC, SDHD, SMAD4, SMARCA4. STK11, TP53, TSC1, TSC2, and VHL.  The following genes were evaluated for sequence changes only: SDHA and HOXB13 c.251G>A variant only.  Results: Negative, no pathogenic variants identified.  The date of this test report is 10/03/2017.    10/28/2017 Imaging   1. Stable pulmonary nodules. No new or progressive findings. 2. No residual or recurrent omental disease is identified.  No obvious peritoneal surface disease. 3. Increasing soft tissue density and ill-defined interstitial changes around the abdominal wall surgical incision. This may be progressive postoperative changes or granulation tissue but attention on future scans to exclude the possibility of tumor. 4. Status post resection of right adnexal mass. No pelvic mass or pelvic adenopathy. 5. Stable T12 compression fracture.   01/25/2018 Tumor Marker   Patient's tumor was tested for the following markers: CA-125 Results of the tumor marker test revealed 9.2   12/10/2018 Tumor Marker   Patient's tumor was tested for the following markers: CA-125 Results of the tumor marker test revealed 212   12/22/2018 Imaging   1. Interval development of multiple soft tissue lesions in the gastrocolic ligament, mesentery,  and along the peritoneal surface. These soft tissue lesions measure up to 6.5 cm in size. Associated interval development of moderate volume ascites in the abdomen and pelvis. Imaging features are consistent with metastatic disease. 2. Soft tissue projects through the rectus sheath in the right paraumbilical region, likely herniated small bowel as there is a large amount of decompressed small bowel does deep to this finding. However, metastatic deposit at this location is also possibility.  3. Trace pneumobilia in the left liver suggests prior sphincterotomy. 4.  Aortic Atherosclerois (ICD10-170.0)   12/30/2018 Procedure   Successful ultrasound-guided therapeutic paracentesis yielding 4.5 liters of peritoneal fluid.   12/30/2018 Procedure   Technically successful right IJ power-injectable port catheter placement. Ready for routine use.   12/31/2018 -  Chemotherapy   The patient had carboplatin and taxol for chemotherapy treatment.     01/21/2019 Tumor Marker   Patient's tumor was tested for the following markers: CA-125 Results of the tumor marker test revealed 408   02/11/2019 Tumor Marker   Patient's tumor was tested for the following markers: CA-125 Results of the tumor marker test revealed 646.   03/02/2019 Imaging   1. Marked improvement in peritoneal carcinomatosis/ascites, without complete resolution. 2.  Aortic atherosclerosis (ICD10-170.0).     03/25/2019 Tumor Marker   Patient's tumor was tested for the following markers: CA-125 Results of the tumor marker test revealed 24.1.   04/15/2019 Tumor Marker   Patient's tumor was tested for the following markers: CA-125 Results of the tumor marker test revealed 15.3   05/18/2019 Imaging   1. There is progressive, nearly complete resolution of peritoneal and omental nodularity seen on prior examination with very faint residual peritoneal and omental haziness (series 2, image 50, 39).   2. Interval resolution of ascites.   3.  Status  post hysterectomy and oophorectomy.   4.  Coronary artery disease.  Aortic Atherosclerosis (ICD10-I70.0).      Tumor Marker   Patient's tumor was tested for the following markers: CA-125 Results of the tumor marker test revealed 11.2.   06/10/2019 Tumor Marker   Patient's tumor was tested for the following markers: CA-125 Results of the tumor marker test revealed 9.1   07/29/2019 Tumor Marker   Patient's tumor was tested for the following markers: CA-125 Results of the tumor marker test revealed 8.8   08/18/2019 Imaging   1. No evidence of new or progressive metastatic disease in the abdomen or pelvis. Stable small nodular soft tissue focus at the right umbilicus. No new peritoneal implants. No ascites. 2. Chronic findings include: Aortic Atherosclerosis (ICD10-I70.0). Coronary atherosclerosis. Chronic moderate T12 vertebral compression fractures.   09/09/2019 Tumor Marker   Patient's tumor was tested for the following markers: CA-125 Results of the tumor marker test  revealed 11.4.   10/21/2019 Tumor Marker   Patient's tumor was tested for the following markers: CA-125 Results of the tumor marker test revealed 21.5   10/31/2019 Imaging   1. New hepatic and peritoneal metastatic disease. 2.  Aortic atherosclerosis (ICD10-I70.0).     11/11/2019 -  Chemotherapy   The patient had carboplatin and gemzar for chemotherapy treatment.     11/11/2019 Tumor Marker   Patient's tumor was tested for the following markers: CA-125 Results of the tumor marker test revealed 40.2    Genetic Testing   Patient has genetic testing done for PD-L1. Results revealed patient has the following:  PD-L1 staining in tumor cells (TC): <1% PD-L1 staining in tumor-associated immune cells (IC): 0% PD-L1 combined positive score (CPS): <1%   11/11/2019 - 05/11/2020 Chemotherapy   The patient had carboplatin and gemzar for chemotherapy treatment    12/16/2019 Tumor Marker   Patient's tumor was tested for the  following markers: CA-125 Results of the tumor marker test revealed 41.7   01/13/2020 Tumor Marker   Patient's tumor was tested for the following markers: CA-125 Results of the tumor marker test revealed 18.9   02/09/2020 Imaging   1. Positive response to therapy. 2. Hypodense lesions in liver are smaller or no longer measurable. 3. No clear residual peritoneal metastasis. 4. No ascites. 5. Persistent enhancement of the common bile duct suggest biliary inflammation.     02/10/2020 Tumor Marker   Patient's tumor was tested for the following markers: CA-125 Results of the tumor marker test revealed 13.2.   03/19/2020 Tumor Marker   Patient's tumor was tested for the following markers: CA-125 Results of the tumor marker test revealed 15.5   04/13/2020 Tumor Marker   Patient's tumor was tested for the following markers: CA-`125 Results of the tumor marker test revealed 17.9.   05/11/2020 Tumor Marker   Patient's tumor was tested for the following markers: CA-125 Results of the tumor marker test revealed 20.1.   05/24/2020 Imaging   1. Interval progression of soft tissue nodularity in the cul-de-sac, along the vaginal cuff. 2. Interval progression of inferior right liver lesion with new tiny capsular soft tissue lesions along the left liver and inferior right liver. Imaging features are highly concerning for metastatic disease. 3. Small volume ascites. 4. Similar appearance of focal enhancement in the region of the umbilicus. 5. Aortic Atherosclerosis (ICD10-I70.0).   06/01/2020 -  Chemotherapy   The patient had taxol for chemotherapy treatment.     06/08/2020 Tumor Marker   Patient's tumor was tested for the following markers: CA-125 Results of the tumor marker test revealed 26   07/13/2020 Tumor Marker   Patient's tumor was tested for the following markers: CA-125. Results of the tumor marker test revealed 17.3   08/17/2020 Tumor Marker   Patient's tumor was tested for the  following markers: CA-125. Results of the tumor marker test revealed 16.7   09/06/2020 Imaging   Status post hysterectomy and bilateral salpingo-oophorectomy.   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   Progressive hepatic metastases.   No evidence of metastatic disease in the chest.   09/12/2020 -  Chemotherapy    Patient is on Treatment Plan: OVARIAN CARBOPLATIN AUC 5 Q21D        REVIEW OF SYSTEMS:   Constitutional: Denies fevers, chills or abnormal weight loss Eyes: Denies blurriness of vision Ears, nose, mouth, throat, and face: Denies mucositis or sore throat Respiratory: Denies cough, dyspnea or wheezes Cardiovascular: Denies  palpitation, chest discomfort or lower extremity swelling Gastrointestinal:  Denies nausea, heartburn or change in bowel habits Skin: Denies abnormal skin rashes Lymphatics: Denies new lymphadenopathy or easy bruising Neurological:Denies numbness, tingling or new weaknesses Behavioral/Psych: Mood is stable, no new changes  All other systems were reviewed with the patient and are negative.  I have reviewed the past medical history, past surgical history, social history and family history with the patient and they are unchanged from previous note.  ALLERGIES:  is allergic to aspirin, nsaids, and tramadol.  MEDICATIONS:  Current Outpatient Medications  Medication Sig Dispense Refill  . acetaminophen (TYLENOL) 650 MG CR tablet Take 1,300 mg by mouth every 8 (eight) hours.     Marland Kitchen atorvastatin (LIPITOR) 80 MG tablet Take 40 mg by mouth at bedtime.     . Cholecalciferol (VITAMIN D3) 2000 units TABS Take 2,000 Units by mouth daily.    . citalopram (CELEXA) 10 MG tablet Take 10 mg by mouth at bedtime    . gabapentin (NEURONTIN) 300 MG capsule Take by mouth.    . Glucosamine Sulfate 500 MG TABS Take 500 mg by mouth 2 (two) times daily.     Marland Kitchen lidocaine-prilocaine (EMLA) cream Apply to affected area once 30 g 3  . loratadine  (CLARITIN) 10 MG tablet Take 10 mg by mouth at bedtime.     Marland Kitchen losartan (COZAAR) 25 MG tablet Take 25 mg by mouth daily.     . meclizine (ANTIVERT) 25 MG tablet Take 25 mg by mouth 2 (two) times daily as needed for dizziness.     . metFORMIN (GLUCOPHAGE) 1000 MG tablet Take 1,000 mg by mouth 2 (two) times daily with a meal.     . omeprazole (PRILOSEC) 40 MG capsule Take 40 mg by mouth daily.     . ondansetron (ZOFRAN) 8 MG tablet Take 1 tablet (8 mg total) by mouth every 8 (eight) hours as needed for refractory nausea / vomiting. 30 tablet 1  . oxyCODONE (OXY IR/ROXICODONE) 5 MG immediate release tablet Take 1 tablet (5 mg total) by mouth every 4 (four) hours as needed for severe pain. 60 tablet 0  . prochlorperazine (COMPAZINE) 10 MG tablet Take 1 tablet (10 mg total) by mouth every 6 (six) hours as needed (Nausea or vomiting). 60 tablet 1  . Pyridoxine HCl (VITAMIN B-6 PO) Take 50 mg by mouth 1 day or 1 dose.    . vitamin B-12 (CYANOCOBALAMIN) 1000 MCG tablet Take 1,000 mcg by mouth daily.      No current facility-administered medications for this visit.   Facility-Administered Medications Ordered in Other Visits  Medication Dose Route Frequency Provider Last Rate Last Admin  . 0.9 %  sodium chloride infusion (Manually program via Guardrails IV Fluids)  250 mL Intravenous Once [name], Chane Magner, MD      . acetaminophen (TYLENOL) tablet 650 mg  650 mg Oral Once [name], Lashon Hillier, MD      . heparin lock flush 100 unit/mL  500 Units Intracatheter Daily PRN [name], Malai Lady, MD      . sodium chloride flush (NS) 0.9 % injection 10 mL  10 mL Intracatheter Once [name], Dany Walther, MD      . sodium chloride flush (NS) 0.9 % injection 10 mL  10 mL Intracatheter PRN [name], Ciella Obi, MD        PHYSICAL EXAMINATION: ECOG PERFORMANCE STATUS: 2 - Symptomatic, <50% confined to bed  Vitals:   11/19/20 0827  BP: (!) 119/57  Pulse: (!) 105  Resp:  18  Temp: 99.4 F (37.4 C)  SpO2: 95%   Filed Weights   11/19/20 0827  Weight:  188 lb (85.3 kg)    GENERAL:alert, she appears ill looking, pale and debilitated sitting on the wheelchair ABDOMEN is grossly distended with ascites Musculoskeletal:no cyanosis of digits and no clubbing  NEURO: alert & oriented x 3 with fluent speech, no focal motor/sensory deficits  LABORATORY DATA:  I have reviewed the data as listed    Component Value Date/Time   NA 139 11/19/2020 0800   NA 139 06/05/2017 0746   K 4.0 11/19/2020 0800   K 4.1 06/05/2017 0746   CL 103 11/19/2020 0800   CO2 25 11/19/2020 0800   CO2 21 (L) 06/05/2017 0746   GLUCOSE 143 (H) 11/19/2020 0800   GLUCOSE 279 (H) 06/05/2017 0746   BUN 16 11/19/2020 0800   BUN 15.0 06/05/2017 0746   CREATININE 0.70 11/19/2020 0800   CREATININE 0.77 10/29/2020 0739   CREATININE 0.9 06/05/2017 0746   CALCIUM 7.9 (L) 11/19/2020 0800   CALCIUM 9.1 01/13/2020 0735   CALCIUM 10.1 06/05/2017 0746   PROT 8.0 11/19/2020 0800   PROT 8.1 06/05/2017 0746   ALBUMIN 1.9 (L) 11/19/2020 0800   ALBUMIN 4.0 06/05/2017 0746   AST 53 (H) 11/19/2020 0800   AST 63 (H) 10/29/2020 0739   AST 22 06/05/2017 0746   ALT 22 11/19/2020 0800   ALT 36 10/29/2020 0739   ALT 36 06/05/2017 0746   ALKPHOS 387 (H) 11/19/2020 0800   ALKPHOS 98 06/05/2017 0746   BILITOT 0.5 11/19/2020 0800   BILITOT 0.4 10/29/2020 0739   BILITOT 0.44 06/05/2017 0746   GFRNONAA >60 11/19/2020 0800   GFRNONAA >60 10/29/2020 0739   GFRAA >60 03/30/2020 0734    No results found for: SPEP, UPEP  Lab Results  Component Value Date   WBC 5.6 11/19/2020   NEUTROABS 4.1 11/19/2020   HGB 7.7 (L) 11/19/2020   HCT 25.0 (L) 11/19/2020   MCV 100.0 11/19/2020   PLT 203 11/19/2020      Chemistry      Component Value Date/Time   NA 139 11/19/2020 0800   NA 139 06/05/2017 0746   K 4.0 11/19/2020 0800   K 4.1 06/05/2017 0746   CL 103 11/19/2020 0800   CO2 25 11/19/2020 0800   CO2 21 (L) 06/05/2017 0746   BUN 16 11/19/2020 0800   BUN 15.0 06/05/2017 0746    CREATININE 0.70 11/19/2020 0800   CREATININE 0.77 10/29/2020 0739   CREATININE 0.9 06/05/2017 0746      Component Value Date/Time   CALCIUM 7.9 (L) 11/19/2020 0800   CALCIUM 9.1 01/13/2020 0735   CALCIUM 10.1 06/05/2017 0746   ALKPHOS 387 (H) 11/19/2020 0800   ALKPHOS 98 06/05/2017 0746   AST 53 (H) 11/19/2020 0800   AST 63 (H) 10/29/2020 0739   AST 22 06/05/2017 0746   ALT 22 11/19/2020 0800   ALT 36 10/29/2020 0739   ALT 36 06/05/2017 0746   BILITOT 0.5 11/19/2020 0800   BILITOT 0.4 10/29/2020 0739   BILITOT 0.44 06/05/2017 0746

## 2020-11-20 ENCOUNTER — Ambulatory Visit (HOSPITAL_COMMUNITY)
Admission: RE | Admit: 2020-11-20 | Discharge: 2020-11-20 | Disposition: A | Discharge status: Home / Self Care | Payer: Medicare Other | Source: Ambulatory Visit | Attending: Hematology and Oncology | Admitting: Hematology and Oncology

## 2020-11-20 DIAGNOSIS — C561 Malignant neoplasm of right ovary: Secondary | ICD-10-CM | POA: Insufficient documentation

## 2020-11-20 LAB — TYPE AND SCREEN
ABO/RH(D): O POS
Antibody Screen: NEGATIVE
Unit division: 0

## 2020-11-20 LAB — BPAM RBC
Blood Product Expiration Date: 202206122359
ISSUE DATE / TIME: 202205161009
Unit Type and Rh: 5100

## 2020-11-20 MED ORDER — LIDOCAINE HCL 1 % IJ SOLN
INTRAMUSCULAR | Status: AC
  Filled 2020-11-20: qty 20

## 2020-11-20 NOTE — Procedures (Signed)
Ultrasound-guided therapeutic paracentesis performed yielding 3.8 liters of bloody fluid. No immediate complications. EBL none.

## 2020-11-22 ENCOUNTER — Other Ambulatory Visit (HOSPITAL_BASED_OUTPATIENT_CLINIC_OR_DEPARTMENT_OTHER): Payer: Self-pay | Admitting: Hematology and Oncology

## 2020-11-22 ENCOUNTER — Ambulatory Visit (HOSPITAL_BASED_OUTPATIENT_CLINIC_OR_DEPARTMENT_OTHER)
Admission: RE | Admit: 2020-11-22 | Discharge: 2020-11-22 | Disposition: A | Discharge status: Home / Self Care | Payer: Medicare Other | Source: Ambulatory Visit | Attending: Hematology and Oncology | Admitting: Hematology and Oncology

## 2020-11-22 ENCOUNTER — Other Ambulatory Visit: Payer: Self-pay

## 2020-11-22 ENCOUNTER — Encounter (HOSPITAL_BASED_OUTPATIENT_CLINIC_OR_DEPARTMENT_OTHER): Payer: Self-pay

## 2020-11-22 DIAGNOSIS — C561 Malignant neoplasm of right ovary: Secondary | ICD-10-CM | POA: Diagnosis present

## 2020-11-23 ENCOUNTER — Inpatient Hospital Stay (HOSPITAL_BASED_OUTPATIENT_CLINIC_OR_DEPARTMENT_OTHER): Payer: Medicare Other | Admitting: Hematology and Oncology

## 2020-11-23 ENCOUNTER — Encounter: Payer: Self-pay | Admitting: Hematology and Oncology

## 2020-11-23 ENCOUNTER — Other Ambulatory Visit (HOSPITAL_COMMUNITY): Payer: Self-pay

## 2020-11-23 DIAGNOSIS — Z7189 Other specified counseling: Secondary | ICD-10-CM

## 2020-11-23 DIAGNOSIS — G893 Neoplasm related pain (acute) (chronic): Secondary | ICD-10-CM

## 2020-11-23 DIAGNOSIS — C787 Secondary malignant neoplasm of liver and intrahepatic bile duct: Secondary | ICD-10-CM

## 2020-11-23 DIAGNOSIS — S22080A Wedge compression fracture of T11-T12 vertebra, initial encounter for closed fracture: Secondary | ICD-10-CM

## 2020-11-23 DIAGNOSIS — T451X5A Adverse effect of antineoplastic and immunosuppressive drugs, initial encounter: Secondary | ICD-10-CM

## 2020-11-23 DIAGNOSIS — C561 Malignant neoplasm of right ovary: Secondary | ICD-10-CM | POA: Diagnosis not present

## 2020-11-23 DIAGNOSIS — D6481 Anemia due to antineoplastic chemotherapy: Secondary | ICD-10-CM

## 2020-11-23 DIAGNOSIS — R180 Malignant ascites: Secondary | ICD-10-CM

## 2020-11-23 MED ORDER — ONDANSETRON HCL 8 MG PO TABS
8.0000 mg | ORAL_TABLET | Freq: Three times a day (TID) | ORAL | 1 refills | Status: AC | PRN
  Filled 2020-11-23: qty 60, 20d supply, fill #0

## 2020-11-23 MED ORDER — PROCHLORPERAZINE MALEATE 10 MG PO TABS
10.0000 mg | ORAL_TABLET | Freq: Four times a day (QID) | ORAL | 1 refills | Status: AC | PRN
  Filled 2020-11-23: qty 60, 15d supply, fill #0

## 2020-11-23 MED ORDER — OXYCODONE HCL 5 MG PO TABS
5.0000 mg | ORAL_TABLET | ORAL | 0 refills | Status: AC | PRN
  Filled 2020-11-23: qty 42, 7d supply, fill #0

## 2020-11-23 NOTE — Assessment & Plan Note (Signed)
She has signs of malignant ascites Today, she did not feel too uncomfortable I do not recommend paracentesis today We discussed the risk and benefits of paracentesis

## 2020-11-23 NOTE — Progress Notes (Signed)
[place] OFFICE PROGRESS NOTE  Patient Care Team: Lilian Coma., MD as PCP - General (Internal Medicine)  ASSESSMENT & PLAN:  Ovarian cancer on right [facility]) I have reviewed CT imaging with the patient and family Unfortunately, she has significant cancer progression We discussed the risk and benefits of future palliative chemotherapy Ultimately, she has made informed decision not to pursue further chemotherapy We will focus on supportive care only She desires to continue blood count monitoring and transfusion as needed She is not ready to enroll with palliative care and hospice  Anemia due to antineoplastic chemotherapy She has multifactorial anemia, combination of anemia secondary to chemotherapy and anemia of chronic illness She was transfused 4 days ago and felt slightly better I will see her again in 2 weeks with plan for further blood transfusion as needed  Metastasis to liver [facility]) She has significant disease burden in her liver She has signs of mild liver failure Observe closely for now  Cancer associated pain I refill her prescription pain medicine We discussed narcotic refill policy  Malignant ascites She has signs of malignant ascites Today, she did not feel too uncomfortable I do not recommend paracentesis today We discussed the risk and benefits of paracentesis  Goals of care, counseling/discussion We discussed prognosis with or without treatment We discussed CODE STATUS and ultimately, she agreed for DO NOT RESUSCITATE   No orders of the defined types were placed in this encounter.   All questions were answered. The patient knows to call the clinic with any problems, questions or concerns. The total time spent in the appointment was 40 minutes encounter with patients including review of chart and various tests results, discussions about plan of care and coordination of care plan   Heath Lark, MD 11/23/2020 3:16 PM  INTERVAL  HISTORY: Please see below for problem oriented charting.. 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    03/20/2017 Imaging   GYN ultrasound was performed this revealed a small fundus measuring 5 cm there are 2 small fibroids within the fundus with the largest measuring 1.3 cm Within the endometrium there is a small amount a fluid the endometrial wall was slightly thickened at 4.8 mm the ultrasound was consistent with a small endometrial polyp  The right adnexa measured 7.4 x 6.7 cm contained a both a cystic and solid mass with a small amount of shadowing This is consistent with a dermoid tumor the left ovary was 2.6 x 1.5 x 1.0 cm There is no other adnexal masses no free fluid within the abdomen no ascites     04/10/2017 Surgery   Operation: Operative Laparscopy with  Peritoneal biopsy 2 hysteroscopy D&C Surgeon: Lynder Parents MD  Specimens: Peritoneal biopsy and cellular washings  Complications: None FIndings Small amount of ascites was  found cellular washings were sent There was peritoneal studding And omental caking Even in steep Trendelenburg the ovaries and uterus were unable to be visualized due to the small intestines in the lower pelvis     04/10/2017  Pathology Results   Biopsy from [facility] was nondiagnostic.  Peritoneum biopsy did not reveal malignancy.  Endometrial biopsy also did not reveal malignancy.   04/13/2017 Tumor Marker   Patient's tumor was tested for the following markers: CA-125 Results of the tumor marker test revealed 324.1   04/20/2017 Tumor Marker   Patient's tumor was tested for the following markers: CA-125 Results of the tumor marker test revealed 345.6   04/22/2017 Pathology Results   Peritoneum, biopsy - CARCINOMA. - SEE COMMENT.   04/22/2017 Procedure   CT-guided core biopsy performed of peritoneal tumor in the left lower quadrant.   04/24/2017 - 09/25/2017 Chemotherapy   She received carboplatin and Taxol x 3 cycles followed by interval surgical debulking on 12/31. Chemotherapy was subsequently resumed for 3 more cycles   06/05/2017 Tumor Marker   Patient's tumor was tested for the following markers: CA-125 Results of the tumor marker test revealed 27.5   06/17/2017 Imaging   1. Considerable improvement in the prior omental caking, which now presents mainly as an indistinct stranding with slight nodularity along the left upper quadrant omentum. 2. Stable appearance of right ovarian mass with fatty and calcific elements classic for a dermoid. 3. There is accentuated enhancement in the walls of the common hepatic duct and common bile duct. A low-grade cholangitis is not readily excluded. 4. Other imaging findings of potential clinical significance: Mild cardiomegaly. Aortic Atherosclerosis (ICD10-I70.0). Lumbar scoliosis, spondylosis, degenerative disc disease, and pars defects at L5. These cause impingement at L5-S1 and lesser impingement at L3-4 and L4-5. 5. 65% compression fracture at L1 slightly worsened than on the prior exam, with associated vertebral sclerosis and posterior retropulsion.   07/06/2017 Surgery   Bilateral Salpingo-Oophorectomy W/Omentectomy, Total Abd  Hysterectomy & Radical Dissection For Debulking Right - Ureterolysis, With Or Without Repositioning Of Ureter For Retroperitoneal Fibrosis   07/06/2017 Pathology Results   A: Omentum, omentectomy - Omentum with minimal residual carcinoma, consistent with high grade serous carcinoma (microscopic; stage ypT3a) - Extensive treatment effect with calcifications, fibrosis, histiocytes, and giant cell response - CRS score 3  B: Uterus with cervix and left ovary and fallopian tube, hysterectomy and left salpingo-oophorectomy - Cervix: Ectocervix and endocervix with no dysplasia or malignancy identified - Endometrium: Inactive with no hyperplasia, atypia, or malignancy identified - Myometrium: Adenomyosis and benign leiomyoma, size 1.2 cm - Serosa: Focal calcification and histiocytes suggestive of treatment effect, with no viable carcinoma identified - Ovary, left: Physiologic changes and no carcinoma identified - Fallopian tube, left: 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 and no malignancy identified  C: Ovary and fallopian tube, right, salpingo-oophorectomy - Right fallopian tube with focal residual carcinoma, consistent with high grade serous carcinoma, and serous tubal intraepithelial carcinoma (STIC) - Associated calcifications and histiocytes suggestive of treatment response - Right ovary with calcifications and histiocytes suggestive of treatment effect, with no definite viable carcinoma identified - Mature cystic teratoma, 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 - See synoptic report and comment   08/14/2017 Tumor Marker   Patient's tumor was tested for the following markers: CA-125 Results of the tumor marker test revealed 14.6   09/04/2017 Tumor Marker   Patient's tumor was tested for the following markers: CA-125 Results of the tumor marker test revealed 12.5  10/03/2017 Genetic Testing   The Common Hereditary Cancer Panel offered by [facility] includes sequencing and/or deletion duplication testing of the  following 47 genes: APC, ATM, AXIN2, BARD1, BMPR1A, BRCA1, BRCA2, BRIP1, CDH1, CDKN2A (p14ARF), CDKN2A (p16INK4a), CKD4, CHEK2, CTNNA1, DICER1, EPCAM (Deletion/duplication testing only), GREM1 (promoter region deletion/duplication testing only), KIT, MEN1, MLH1, MSH2, MSH3, MSH6, MUTYH, NBN, NF1, NHTL1, PALB2, PDGFRA, PMS2, POLD1, POLE, PTEN, RAD50, RAD51C, RAD51D, SDHB, SDHC, SDHD, SMAD4, SMARCA4. STK11, TP53, TSC1, TSC2, and VHL.  The following genes were evaluated for sequence changes only: SDHA and HOXB13 c.251G>A variant only.  Results: Negative, no pathogenic variants identified.  The date of this test report is 10/03/2017.    10/28/2017 Imaging   1. Stable pulmonary nodules. No new or progressive findings. 2. No residual or recurrent omental disease is identified. No obvious peritoneal surface disease. 3. Increasing soft tissue density and ill-defined interstitial changes around the abdominal wall surgical incision. This may be progressive postoperative changes or granulation tissue but attention on future scans to exclude the possibility of tumor. 4. Status post resection of right adnexal mass. No pelvic mass or pelvic adenopathy. 5. Stable T12 compression fracture.   01/25/2018 Tumor Marker   Patient's tumor was tested for the following markers: CA-125 Results of the tumor marker test revealed 9.2   12/10/2018 Tumor Marker   Patient's tumor was tested for the following markers: CA-125 Results of the tumor marker test revealed 212   12/22/2018 Imaging   1. Interval development of multiple soft tissue lesions in the gastrocolic ligament, mesentery, and along the peritoneal surface. These soft tissue lesions measure up to 6.5 cm in size. Associated interval development of moderate volume ascites in the abdomen and pelvis. Imaging features are consistent with metastatic disease. 2. Soft tissue projects through the rectus sheath in the right paraumbilical region, likely herniated small bowel as  there is a large amount of decompressed small bowel does deep to this finding. However, metastatic deposit at this location is also possibility.  3. Trace pneumobilia in the left liver suggests prior sphincterotomy. 4.  Aortic Atherosclerois (ICD10-170.0)   12/30/2018 Procedure   Successful ultrasound-guided therapeutic paracentesis yielding 4.5 liters of peritoneal fluid.   12/30/2018 Procedure   Technically successful right IJ power-injectable port catheter placement. Ready for routine use.   12/31/2018 -  Chemotherapy   The patient had carboplatin and taxol for chemotherapy treatment.     01/21/2019 Tumor Marker   Patient's tumor was tested for the following markers: CA-125 Results of the tumor marker test revealed 408   02/11/2019 Tumor Marker   Patient's tumor was tested for the following markers: CA-125 Results of the tumor marker test revealed 646.   03/02/2019 Imaging   1. Marked improvement in peritoneal carcinomatosis/ascites, without complete resolution. 2.  Aortic atherosclerosis (ICD10-170.0).     03/25/2019 Tumor Marker   Patient's tumor was tested for the following markers: CA-125 Results of the tumor marker test revealed 24.1.   04/15/2019 Tumor Marker   Patient's tumor was tested for the following markers: CA-125 Results of the tumor marker test revealed 15.3   05/18/2019 Imaging   1. There is progressive, nearly complete resolution of peritoneal and omental nodularity seen on prior examination with very faint residual peritoneal and omental haziness (series 2, image 50, 39).   2. Interval resolution of ascites.   3.  Status post hysterectomy and oophorectomy.   4.  Coronary artery disease.  Aortic Atherosclerosis (ICD10-I70.0).      Tumor Marker  Patient's tumor was tested for the following markers: CA-125 Results of the tumor marker test revealed 11.2.   06/10/2019 Tumor Marker   Patient's tumor was tested for the following markers: CA-125 Results of the  tumor marker test revealed 9.1   07/29/2019 Tumor Marker   Patient's tumor was tested for the following markers: CA-125 Results of the tumor marker test revealed 8.8   08/18/2019 Imaging   1. No evidence of new or progressive metastatic disease in the abdomen or pelvis. Stable small nodular soft tissue focus at the right umbilicus. No new peritoneal implants. No ascites. 2. Chronic findings include: Aortic Atherosclerosis (ICD10-I70.0). Coronary atherosclerosis. Chronic moderate T12 vertebral compression fractures.   09/09/2019 Tumor Marker   Patient's tumor was tested for the following markers: CA-125 Results of the tumor marker test revealed 11.4.   10/21/2019 Tumor Marker   Patient's tumor was tested for the following markers: CA-125 Results of the tumor marker test revealed 21.5   10/31/2019 Imaging   1. New hepatic and peritoneal metastatic disease. 2.  Aortic atherosclerosis (ICD10-I70.0).     11/11/2019 -  Chemotherapy   The patient had carboplatin and gemzar for chemotherapy treatment.     11/11/2019 Tumor Marker   Patient's tumor was tested for the following markers: CA-125 Results of the tumor marker test revealed 40.2    Genetic Testing   Patient has genetic testing done for PD-L1. Results revealed patient has the following:  PD-L1 staining in tumor cells (TC): <1% PD-L1 staining in tumor-associated immune cells (IC): 0% PD-L1 combined positive score (CPS): <1%   11/11/2019 - 05/11/2020 Chemotherapy   The patient had carboplatin and gemzar for chemotherapy treatment    12/16/2019 Tumor Marker   Patient's tumor was tested for the following markers: CA-125 Results of the tumor marker test revealed 41.7   01/13/2020 Tumor Marker   Patient's tumor was tested for the following markers: CA-125 Results of the tumor marker test revealed 18.9   02/09/2020 Imaging   1. Positive response to therapy. 2. Hypodense lesions in liver are smaller or no longer measurable. 3. No clear  residual peritoneal metastasis. 4. No ascites. 5. Persistent enhancement of the common bile duct suggest biliary inflammation.     02/10/2020 Tumor Marker   Patient's tumor was tested for the following markers: CA-125 Results of the tumor marker test revealed 13.2.   03/19/2020 Tumor Marker   Patient's tumor was tested for the following markers: CA-125 Results of the tumor marker test revealed 15.5   04/13/2020 Tumor Marker   Patient's tumor was tested for the following markers: CA-`125 Results of the tumor marker test revealed 17.9.   05/11/2020 Tumor Marker   Patient's tumor was tested for the following markers: CA-125 Results of the tumor marker test revealed 20.1.   05/24/2020 Imaging   1. Interval progression of soft tissue nodularity in the cul-de-sac, along the vaginal cuff. 2. Interval progression of inferior right liver lesion with new tiny capsular soft tissue lesions along the left liver and inferior right liver. Imaging features are highly concerning for metastatic disease. 3. Small volume ascites. 4. Similar appearance of focal enhancement in the region of the umbilicus. 5. Aortic Atherosclerosis (ICD10-I70.0).   06/01/2020 -  Chemotherapy   The patient had taxol for chemotherapy treatment.     06/08/2020 Tumor Marker   Patient's tumor was tested for the following markers: CA-125 Results of the tumor marker test revealed 26   07/13/2020 Tumor Marker   Patient's tumor was tested  for the following markers: CA-125. Results of the tumor marker test revealed 17.3   08/17/2020 Tumor Marker   Patient's tumor was tested for the following markers: CA-125. Results of the tumor marker test revealed 16.7   09/06/2020 Imaging   Status post hysterectomy and bilateral salpingo-oophorectomy.   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   Progressive hepatic metastases.   No evidence of metastatic disease in the chest.   09/12/2020 - 10/29/2020 Chemotherapy     Patient is on Treatment Plan: OVARIAN CARBOPLATIN AUC 5 Q21D       11/20/2020 Procedure   Successful ultrasound-guided therapeutic paracentesis yielding 3.8 liters of peritoneal fluid   11/22/2020 Imaging   1. Marked interval progression of disease. 2. Clear enlargement of hepatic metastases with new liver lesions evident. 3. Interval enlargement of the mass lesion in the splenic hilum. 4. Interval progression of peritoneal disease with increasing moderate volume ascites now evident in the abdomen and pelvis. 5. Peritoneal disease in the cul-de-sac has also progressed in the interval 6. Aortic Atherosclerosis (ICD10-I70.0).       REVIEW OF SYSTEMS:   Constitutional: Denies fevers, chills or abnormal weight loss Eyes: Denies blurriness of vision Ears, nose, mouth, throat, and face: Denies mucositis or sore throat Respiratory: Denies cough, dyspnea or wheezes Cardiovascular: Denies palpitation, chest discomfort or lower extremity swelling Gastrointestinal:  Denies nausea, heartburn or change in bowel habits Skin: Denies abnormal skin rashes Lymphatics: Denies new lymphadenopathy or easy bruising Neurological:Denies numbness, tingling or new weaknesses Behavioral/Psych: Mood is stable, no new changes  All other systems were reviewed with the patient and are negative.  I have reviewed the past medical history, past surgical history, social history and family history with the patient and they are unchanged from previous note.  ALLERGIES:  is allergic to aspirin, nsaids, and tramadol.  MEDICATIONS:  Current Outpatient Medications  Medication Sig Dispense Refill  . acetaminophen (TYLENOL) 650 MG CR tablet Take 1,300 mg by mouth every 8 (eight) hours.     Marland Kitchen atorvastatin (LIPITOR) 80 MG tablet Take 40 mg by mouth at bedtime.     . Cholecalciferol (VITAMIN D3) 2000 units TABS Take 2,000 Units by mouth daily.    . citalopram (CELEXA) 10 MG tablet Take 10 mg by mouth at bedtime    .  gabapentin (NEURONTIN) 300 MG capsule Take by mouth.    . Glucosamine Sulfate 500 MG TABS Take 500 mg by mouth 2 (two) times daily.     Marland Kitchen lidocaine-prilocaine (EMLA) cream Apply to affected area once 30 g 3  . loratadine (CLARITIN) 10 MG tablet Take 10 mg by mouth at bedtime.     Marland Kitchen losartan (COZAAR) 25 MG tablet Take 25 mg by mouth daily.     . meclizine (ANTIVERT) 25 MG tablet Take 25 mg by mouth 2 (two) times daily as needed for dizziness.     . metFORMIN (GLUCOPHAGE) 1000 MG tablet Take 1,000 mg by mouth 2 (two) times daily with a meal.     . omeprazole (PRILOSEC) 40 MG capsule Take 40 mg by mouth daily.     . ondansetron (ZOFRAN) 8 MG tablet Take 1 tablet (8 mg total) by mouth every 8 (eight) hours as needed for refractory nausea / vomiting. 60 tablet 1  . oxyCODONE (OXY IR/ROXICODONE) 5 MG immediate release tablet Take 1 tablet (5 mg total) by mouth every 4 (four) hours as needed for severe pain. 90 tablet 0  . prochlorperazine (COMPAZINE) 10 MG  tablet Take 1 tablet (10 mg total) by mouth every 6 (six) hours as needed (Nausea or vomiting). 60 tablet 1  . Pyridoxine HCl (VITAMIN B-6 PO) Take 50 mg by mouth 1 day or 1 dose.    . vitamin B-12 (CYANOCOBALAMIN) 1000 MCG tablet Take 1,000 mcg by mouth daily.      No current facility-administered medications for this visit.   Facility-Administered Medications Ordered in Other Visits  Medication Dose Route Frequency Provider Last Rate Last Admin  . sodium chloride flush (NS) 0.9 % injection 10 mL  10 mL Intracatheter Once Heath Lark, MD        PHYSICAL EXAMINATION: ECOG PERFORMANCE STATUS: 2 - Symptomatic, <50% confined to bed  Vitals:   11/23/20 1352  BP: 125/69  Pulse: (!) 130  Resp: 18  Temp: 99.1 F (37.3 C)  SpO2: 96%   Filed Weights   11/23/20 1352  Weight: 184 lb 6.4 oz (83.6 kg)    GENERAL:alert, no distress and comfortable.  She looks pale ABDOMEN:abdomen appears distended with ascites  musculoskeletal:no cyanosis of  digits and no clubbing  NEURO: alert & oriented x 3 with fluent speech, no focal motor/sensory deficits  LABORATORY DATA:  I have reviewed the data as listed    Component Value Date/Time   NA 139 11/19/2020 0800   NA 139 06/05/2017 0746   K 4.0 11/19/2020 0800   K 4.1 06/05/2017 0746   CL 103 11/19/2020 0800   CO2 25 11/19/2020 0800   CO2 21 (L) 06/05/2017 0746   GLUCOSE 143 (H) 11/19/2020 0800   GLUCOSE 279 (H) 06/05/2017 0746   BUN 16 11/19/2020 0800   BUN 15.0 06/05/2017 0746   CREATININE 0.70 11/19/2020 0800   CREATININE 0.77 10/29/2020 0739   CREATININE 0.9 06/05/2017 0746   CALCIUM 7.9 (L) 11/19/2020 0800   CALCIUM 9.1 01/13/2020 0735   CALCIUM 10.1 06/05/2017 0746   PROT 8.0 11/19/2020 0800   PROT 8.1 06/05/2017 0746   ALBUMIN 1.9 (L) 11/19/2020 0800   ALBUMIN 4.0 06/05/2017 0746   AST 53 (H) 11/19/2020 0800   AST 63 (H) 10/29/2020 0739   AST 22 06/05/2017 0746   ALT 22 11/19/2020 0800   ALT 36 10/29/2020 0739   ALT 36 06/05/2017 0746   ALKPHOS 387 (H) 11/19/2020 0800   ALKPHOS 98 06/05/2017 0746   BILITOT 0.5 11/19/2020 0800   BILITOT 0.4 10/29/2020 0739   BILITOT 0.44 06/05/2017 0746   GFRNONAA >60 11/19/2020 0800   GFRNONAA >60 10/29/2020 0739   GFRAA >60 03/30/2020 0734    No results found for: SPEP, UPEP  Lab Results  Component Value Date   WBC 5.6 11/19/2020   NEUTROABS 4.1 11/19/2020   HGB 7.7 (L) 11/19/2020   HCT 25.0 (L) 11/19/2020   MCV 100.0 11/19/2020   PLT 203 11/19/2020      Chemistry      Component Value Date/Time   NA 139 11/19/2020 0800   NA 139 06/05/2017 0746   K 4.0 11/19/2020 0800   K 4.1 06/05/2017 0746   CL 103 11/19/2020 0800   CO2 25 11/19/2020 0800   CO2 21 (L) 06/05/2017 0746   BUN 16 11/19/2020 0800   BUN 15.0 06/05/2017 0746   CREATININE 0.70 11/19/2020 0800   CREATININE 0.77 10/29/2020 0739   CREATININE 0.9 06/05/2017 0746      Component Value Date/Time   CALCIUM 7.9 (L) 11/19/2020 0800   CALCIUM 9.1  01/13/2020 0735   CALCIUM 10.1 06/05/2017  0746   ALKPHOS 387 (H) 11/19/2020 0800   ALKPHOS 98 06/05/2017 0746   AST 53 (H) 11/19/2020 0800   AST 63 (H) 10/29/2020 0739   AST 22 06/05/2017 0746   ALT 22 11/19/2020 0800   ALT 36 10/29/2020 0739   ALT 36 06/05/2017 0746   BILITOT 0.5 11/19/2020 0800   BILITOT 0.4 10/29/2020 0739   BILITOT 0.44 06/05/2017 0746       RADIOGRAPHIC STUDIES: I have reviewed multiple imaging studies with the patient and family I have personally reviewed the radiological images as listed and agreed with the findings in the report. CT ABDOMEN PELVIS WO CONTRAST  Result Date: 11/23/2020 CLINICAL DATA:  Ovarian cancer.  Restaging. EXAM: CT ABDOMEN AND PELVIS WITHOUT CONTRAST TECHNIQUE: Multidetector CT imaging of the abdomen and pelvis was performed following the standard protocol without IV contrast. COMPARISON:  09/06/2020. FINDINGS: Lower chest: Unremarkable. Hepatobiliary: Interval progression of hepatic metastases. Inferior right liver lesion measured previously at 3.7 cm is now 6.3 cm on image 35 of series 2. Index lesion along the gallbladder fossa measured previously at 2.8 cm is now 3.9 cm (32/2). 2.3 cm posterior right hepatic lesion on 18/2 was only about 6 mm previously (remeasured). 3.8 cm anterior left hepatic lesion on 17/2 was measured previously at 1.4 cm. Several new liver metastases are evident. Gallbladder is surgically absent. No intrahepatic or extrahepatic biliary dilation. Pancreas: No focal mass lesion. No dilatation of the main duct. No intraparenchymal cyst. No peripancreatic edema. Spleen: Mass lesion seen previously in the splenic hilum at 2.7 cm is now 4.6 cm (21/2). Adrenals/Urinary Tract: No adrenal nodule or mass. Unremarkable noncontrast appearance of the kidneys. No evidence for hydroureter. Bladder is decompressed. Stomach/Bowel: Stomach is unremarkable. No gastric wall thickening. No evidence of outlet obstruction. Duodenum is normally  positioned as is the ligament of Treitz. Duodenal diverticulum noted. No small bowel wall thickening. No small bowel dilatation. The terminal ileum is normal. The appendix is normal. No gross colonic mass. No colonic wall thickening. Vascular/Lymphatic: There is abdominal aortic atherosclerosis without aneurysm. There is no gastrohepatic or hepatoduodenal ligament lymphadenopathy. No retroperitoneal or mesenteric lymphadenopathy. No pelvic sidewall lymphadenopathy. Reproductive: No adnexal mass. Irregular soft tissue mass in the cul-de-sac measured previously at 7.4 x 6.3 cm is now 8.8 x 7.5 cm. Other: Trace ascites seen previously is progressive in the interval with moderate volume ascites now evident in the abdomen and pelvis. Scattered areas of peritoneal nodularity/thickening are evident throughout the abdomen and pelvis with a low-density left omental lesion measuring 6.1 x 5.9 cm on image 41/2. Musculoskeletal: No worrisome lytic or sclerotic osseous abnormality. Body wall edema noted lower abdomen and pelvis. IMPRESSION: 1. Marked interval progression of disease. 2. Clear enlargement of hepatic metastases with new liver lesions evident. 3. Interval enlargement of the mass lesion in the splenic hilum. 4. Interval progression of peritoneal disease with increasing moderate volume ascites now evident in the abdomen and pelvis. 5. Peritoneal disease in the cul-de-sac has also progressed in the interval 6. Aortic Atherosclerosis (ICD10-I70.0). Electronically Signed   By: Misty Stanley M.D.   On: 11/23/2020 10:52   US Paracentesis  Result Date: 11/20/2020 INDICATION: Patient with history of ovarian cancer with recurrent malignant ascites. Request received for therapeutic paracentesis up to 5 liters. EXAM: ULTRASOUND GUIDED THERAPEUTIC PARACENTESIS MEDICATIONS: 1% lidocaine to skin and subcutaneous tissue COMPLICATIONS: None immediate. PROCEDURE: Informed written consent was obtained from the patient after a  discussion of the risks, benefits and alternatives to  treatment. A timeout was performed prior to the initiation of the procedure. Initial ultrasound scanning demonstrates a moderate amount of ascites within the left lower abdominal quadrant. The left lower abdomen was prepped and draped in the usual sterile fashion. 1% lidocaine was used for local anesthesia. Following this, a 19 gauge, 10-cm, Yueh catheter was introduced. An ultrasound image was saved for documentation purposes. The paracentesis was performed. The catheter was removed and a dressing was applied. The patient tolerated the procedure well without immediate post procedural complication. FINDINGS: A total of approximately 3.8 liters of bloody fluid was removed. IMPRESSION: Successful ultrasound-guided therapeutic paracentesis yielding 3.8 liters of peritoneal fluid. Read by: Rowe Robert, PA-C Electronically Signed   By: Lucrezia Europe M.D.   On: 11/20/2020 10:43

## 2020-11-23 NOTE — Assessment & Plan Note (Signed)
We discussed prognosis with or without treatment We discussed CODE STATUS and ultimately, she agreed for DO NOT RESUSCITATE

## 2020-11-23 NOTE — Assessment & Plan Note (Signed)
She has significant disease burden in her liver She has signs of mild liver failure Observe closely for now

## 2020-11-23 NOTE — Assessment & Plan Note (Signed)
I refill her prescription pain medicine We discussed narcotic refill policy 

## 2020-11-23 NOTE — Assessment & Plan Note (Signed)
I have reviewed CT imaging with the patient and family Unfortunately, she has significant cancer progression We discussed the risk and benefits of future palliative chemotherapy Ultimately, she has made informed decision not to pursue further chemotherapy We will focus on supportive care only She desires to continue blood count monitoring and transfusion as needed She is not ready to enroll with palliative care and hospice

## 2020-11-23 NOTE — Assessment & Plan Note (Signed)
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

## 2020-12-06 ENCOUNTER — Telehealth: Payer: Self-pay

## 2020-12-06 NOTE — Telephone Encounter (Signed)
Called and left a message for the husband to call the office with a update on how Cheryl Melton is doing.

## 2020-12-07 ENCOUNTER — Inpatient Hospital Stay: Payer: Medicare Other | Admitting: Hematology and Oncology

## 2020-12-07 ENCOUNTER — Inpatient Hospital Stay: Payer: Medicare Other

## 2020-12-11 ENCOUNTER — Telehealth: Payer: Self-pay

## 2020-12-11 NOTE — Telephone Encounter (Signed)
Scarlet called and left a message. Lulla passed on 01-04-2023. She wants to thank everyone for care and kindness. She will stop by the office sometime.

## 2021-01-04 DEATH — deceased
